# Patient Record
Sex: Female | Born: 1965 | Hispanic: No | Marital: Married | Smoking: Never smoker
Health system: Southern US, Community
[De-identification: ages and names within clinical notes are randomized; demographics above are authoritative.]

## PROBLEM LIST (undated history)

## (undated) DIAGNOSIS — E1165 Type 2 diabetes mellitus with hyperglycemia: Secondary | ICD-10-CM

## (undated) DIAGNOSIS — C50919 Malignant neoplasm of unspecified site of unspecified female breast: Secondary | ICD-10-CM

## (undated) DIAGNOSIS — G8191 Hemiplegia, unspecified affecting right dominant side: Secondary | ICD-10-CM

## (undated) DIAGNOSIS — I69398 Other sequelae of cerebral infarction: Secondary | ICD-10-CM

## (undated) DIAGNOSIS — I499 Cardiac arrhythmia, unspecified: Secondary | ICD-10-CM

## (undated) DIAGNOSIS — R519 Headache, unspecified: Secondary | ICD-10-CM

## (undated) DIAGNOSIS — D62 Acute posthemorrhagic anemia: Secondary | ICD-10-CM

## (undated) DIAGNOSIS — I10 Essential (primary) hypertension: Secondary | ICD-10-CM

## (undated) DIAGNOSIS — C50112 Malignant neoplasm of central portion of left female breast: Secondary | ICD-10-CM

## (undated) DIAGNOSIS — R208 Other disturbances of skin sensation: Secondary | ICD-10-CM

## (undated) DIAGNOSIS — R001 Bradycardia, unspecified: Secondary | ICD-10-CM

## (undated) DIAGNOSIS — K5901 Slow transit constipation: Secondary | ICD-10-CM

## (undated) DIAGNOSIS — R209 Unspecified disturbances of skin sensation: Secondary | ICD-10-CM

## (undated) DIAGNOSIS — I509 Heart failure, unspecified: Secondary | ICD-10-CM

## (undated) DIAGNOSIS — Z170 Estrogen receptor positive status [ER+]: Secondary | ICD-10-CM

## (undated) HISTORY — DX: Slow transit constipation: K59.01

## (undated) HISTORY — DX: Hemiplegia, unspecified affecting right dominant side: G81.91

## (undated) HISTORY — DX: Bradycardia, unspecified: R00.1

## (undated) HISTORY — DX: Unspecified disturbances of skin sensation: R20.9

## (undated) HISTORY — PX: BREAST BIOPSY: SHX20

## (undated) HISTORY — DX: Acute posthemorrhagic anemia: D62

## (undated) HISTORY — DX: Essential (primary) hypertension: I10

## (undated) HISTORY — DX: Other sequelae of cerebral infarction: I69.398

## (undated) HISTORY — DX: Type 2 diabetes mellitus with hyperglycemia: E11.65

## (undated) HISTORY — DX: Estrogen receptor positive status (ER+): Z17.0

## (undated) HISTORY — DX: Estrogen receptor positive status (ER+): C50.112

## (undated) HISTORY — DX: Other disturbances of skin sensation: R20.8

---

## 2020-01-08 ENCOUNTER — Other Ambulatory Visit: Payer: Self-pay | Admitting: Hematology and Oncology

## 2020-01-29 HISTORY — PX: CARDIAC CATHETERIZATION: SHX172

## 2020-03-27 ENCOUNTER — Emergency Department (HOSPITAL_COMMUNITY): Payer: No Typology Code available for payment source

## 2020-03-27 ENCOUNTER — Encounter (HOSPITAL_COMMUNITY): Payer: Self-pay | Admitting: Emergency Medicine

## 2020-03-27 ENCOUNTER — Other Ambulatory Visit: Payer: Self-pay

## 2020-03-27 ENCOUNTER — Inpatient Hospital Stay (HOSPITAL_COMMUNITY)
Admission: EM | Admit: 2020-03-27 | Discharge: 2020-04-01 | DRG: 065 | Disposition: A | Discharge status: Rehabilitation Facility | Payer: No Typology Code available for payment source | Attending: Internal Medicine | Admitting: Internal Medicine

## 2020-03-27 DIAGNOSIS — I10 Essential (primary) hypertension: Secondary | ICD-10-CM

## 2020-03-27 DIAGNOSIS — I63432 Cerebral infarction due to embolism of left posterior cerebral artery: Principal | ICD-10-CM | POA: Diagnosis present

## 2020-03-27 DIAGNOSIS — R29702 NIHSS score 2: Secondary | ICD-10-CM | POA: Diagnosis present

## 2020-03-27 DIAGNOSIS — Z833 Family history of diabetes mellitus: Secondary | ICD-10-CM

## 2020-03-27 DIAGNOSIS — R29898 Other symptoms and signs involving the musculoskeletal system: Secondary | ICD-10-CM

## 2020-03-27 DIAGNOSIS — G8191 Hemiplegia, unspecified affecting right dominant side: Secondary | ICD-10-CM

## 2020-03-27 DIAGNOSIS — E1165 Type 2 diabetes mellitus with hyperglycemia: Secondary | ICD-10-CM

## 2020-03-27 DIAGNOSIS — E785 Hyperlipidemia, unspecified: Secondary | ICD-10-CM | POA: Diagnosis present

## 2020-03-27 DIAGNOSIS — I5032 Chronic diastolic (congestive) heart failure: Secondary | ICD-10-CM | POA: Diagnosis present

## 2020-03-27 DIAGNOSIS — G459 Transient cerebral ischemic attack, unspecified: Secondary | ICD-10-CM

## 2020-03-27 DIAGNOSIS — Z79899 Other long term (current) drug therapy: Secondary | ICD-10-CM

## 2020-03-27 DIAGNOSIS — I110 Hypertensive heart disease with heart failure: Secondary | ICD-10-CM | POA: Diagnosis present

## 2020-03-27 DIAGNOSIS — D649 Anemia, unspecified: Secondary | ICD-10-CM | POA: Diagnosis present

## 2020-03-27 DIAGNOSIS — M21371 Foot drop, right foot: Secondary | ICD-10-CM | POA: Diagnosis present

## 2020-03-27 DIAGNOSIS — R29810 Facial weakness: Secondary | ICD-10-CM | POA: Diagnosis present

## 2020-03-27 DIAGNOSIS — I509 Heart failure, unspecified: Secondary | ICD-10-CM

## 2020-03-27 DIAGNOSIS — C50919 Malignant neoplasm of unspecified site of unspecified female breast: Secondary | ICD-10-CM | POA: Diagnosis present

## 2020-03-27 DIAGNOSIS — I480 Paroxysmal atrial fibrillation: Secondary | ICD-10-CM | POA: Diagnosis present

## 2020-03-27 DIAGNOSIS — E876 Hypokalemia: Secondary | ICD-10-CM | POA: Diagnosis present

## 2020-03-27 DIAGNOSIS — I639 Cerebral infarction, unspecified: Secondary | ICD-10-CM | POA: Diagnosis present

## 2020-03-27 DIAGNOSIS — Z79811 Long term (current) use of aromatase inhibitors: Secondary | ICD-10-CM

## 2020-03-27 DIAGNOSIS — Z7901 Long term (current) use of anticoagulants: Secondary | ICD-10-CM

## 2020-03-27 DIAGNOSIS — I951 Orthostatic hypotension: Secondary | ICD-10-CM | POA: Diagnosis not present

## 2020-03-27 DIAGNOSIS — Z20822 Contact with and (suspected) exposure to covid-19: Secondary | ICD-10-CM | POA: Diagnosis present

## 2020-03-27 DIAGNOSIS — I6340 Cerebral infarction due to embolism of unspecified cerebral artery: Secondary | ICD-10-CM | POA: Insufficient documentation

## 2020-03-27 HISTORY — DX: Heart failure, unspecified: I50.9

## 2020-03-27 HISTORY — DX: Cardiac arrhythmia, unspecified: I49.9

## 2020-03-27 HISTORY — DX: Essential (primary) hypertension: I10

## 2020-03-27 HISTORY — DX: Malignant neoplasm of unspecified site of unspecified female breast: C50.919

## 2020-03-27 LAB — COMPREHENSIVE METABOLIC PANEL
ALT: 16 U/L (ref 0–44)
AST: 22 U/L (ref 15–41)
Albumin: 3.1 g/dL — ABNORMAL LOW (ref 3.5–5.0)
Alkaline Phosphatase: 58 U/L (ref 38–126)
Anion gap: 9 (ref 5–15)
BUN: 11 mg/dL (ref 6–20)
CO2: 25 mmol/L (ref 22–32)
Calcium: 9.1 mg/dL (ref 8.9–10.3)
Chloride: 105 mmol/L (ref 98–111)
Creatinine, Ser: 0.88 mg/dL (ref 0.44–1.00)
GFR, Estimated: >60 mL/min (ref >60)
Glucose, Bld: 175 mg/dL — ABNORMAL HIGH (ref 70–99)
Potassium: 3.3 mmol/L — ABNORMAL LOW (ref 3.5–5.1)
Sodium: 139 mmol/L (ref 135–145)
Total Bilirubin: 0.9 mg/dL (ref 0.3–1.2)
Total Protein: 7.9 g/dL (ref 6.5–8.1)

## 2020-03-27 LAB — DIFFERENTIAL
Abs Immature Granulocytes: 0.03 10*3/uL (ref 0.00–0.07)
Basophils Absolute: 0 10*3/uL (ref 0.0–0.1)
Basophils Relative: 0 %
Eosinophils Absolute: 0.1 10*3/uL (ref 0.0–0.5)
Eosinophils Relative: 2 %
Immature Granulocytes: 0 %
Lymphocytes Relative: 36 %
Lymphs Abs: 3 10*3/uL (ref 0.7–4.0)
Monocytes Absolute: 0.5 10*3/uL (ref 0.1–1.0)
Monocytes Relative: 6 %
Neutro Abs: 4.6 10*3/uL (ref 1.7–7.7)
Neutrophils Relative %: 56 %

## 2020-03-27 LAB — I-STAT CHEM 8, ED
BUN: 12 mg/dL (ref 6–20)
Calcium, Ion: 1.18 mmol/L (ref 1.15–1.40)
Chloride: 104 mmol/L (ref 98–111)
Creatinine, Ser: 0.8 mg/dL (ref 0.44–1.00)
Glucose, Bld: 171 mg/dL — ABNORMAL HIGH (ref 70–99)
HCT: 39 % (ref 36.0–46.0)
Hemoglobin: 13.3 g/dL (ref 12.0–15.0)
Potassium: 3.2 mmol/L — ABNORMAL LOW (ref 3.5–5.1)
Sodium: 142 mmol/L (ref 135–145)
TCO2: 26 mmol/L (ref 22–32)

## 2020-03-27 LAB — CBC
HCT: 38.5 % (ref 36.0–46.0)
Hemoglobin: 11.6 g/dL — ABNORMAL LOW (ref 12.0–15.0)
MCH: 25.4 pg — ABNORMAL LOW (ref 26.0–34.0)
MCHC: 30.1 g/dL (ref 30.0–36.0)
MCV: 84.2 fL (ref 80.0–100.0)
Platelets: 328 10*3/uL (ref 150–400)
RBC: 4.57 MIL/uL (ref 3.87–5.11)
RDW: 19.5 % — ABNORMAL HIGH (ref 11.5–15.5)
WBC: 8.3 10*3/uL (ref 4.0–10.5)
nRBC: 0 % (ref 0.0–0.2)

## 2020-03-27 LAB — PROTIME-INR
INR: 1.2 (ref 0.8–1.2)
Prothrombin Time: 14.3 seconds (ref 11.4–15.2)

## 2020-03-27 LAB — I-STAT BETA HCG BLOOD, ED (MC, WL, AP ONLY): I-stat hCG, quantitative: <5 m[IU]/mL (ref <5)

## 2020-03-27 LAB — APTT: aPTT: 29 seconds (ref 24–36)

## 2020-03-27 IMAGING — CT CT HEAD W/O CM
3 series · 15 of 47 positions shown, 18 images · non-contrast
Comparison: None.

CLINICAL DATA: Cranial neuropathy, altered mental status, vomiting,
temporal headache

EXAM:
CT HEAD WITHOUT CONTRAST
TECHNIQUE: Contiguous axial images were obtained from the base of the skull
through the vertex without intravenous contrast.

[Series 3: head 5.0 h30s · axial · 0.45mm/px · z∈[-146,-6]mm · 9 of 34 slices shown, 12 images]
[im 3/34  brain]
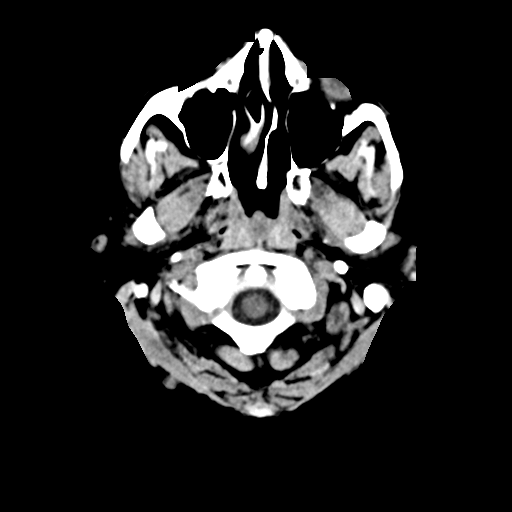
[im 3/34  bone]
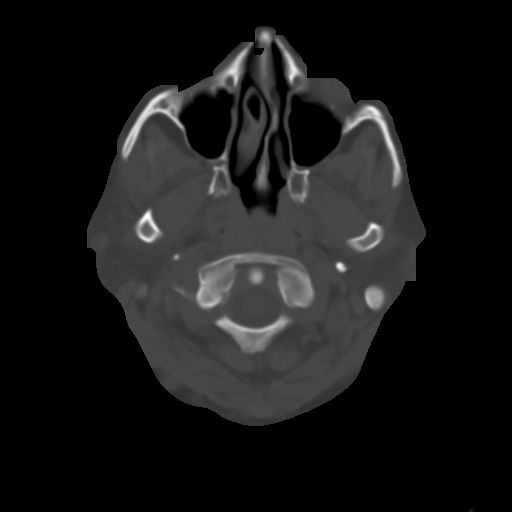
[im 6/34  brain]
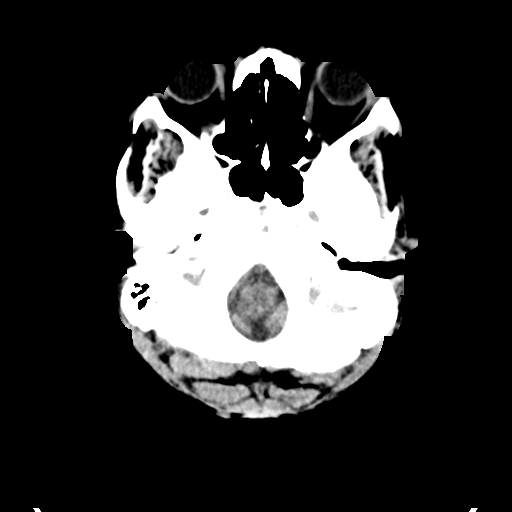
[im 10/34  brain]
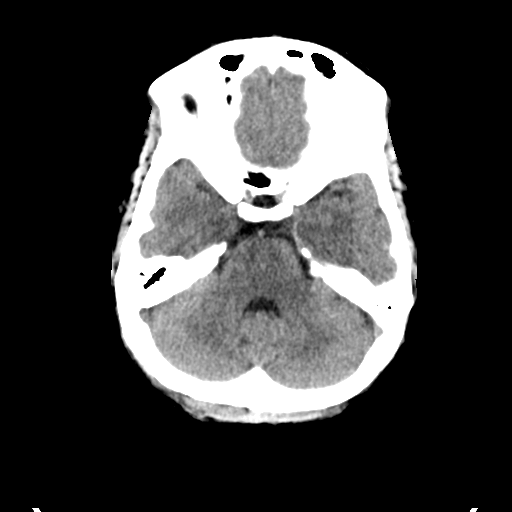
[im 13/34  brain]
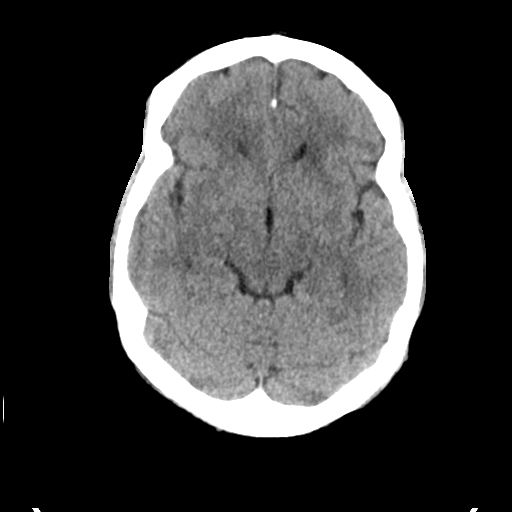
[im 18/34  brain]
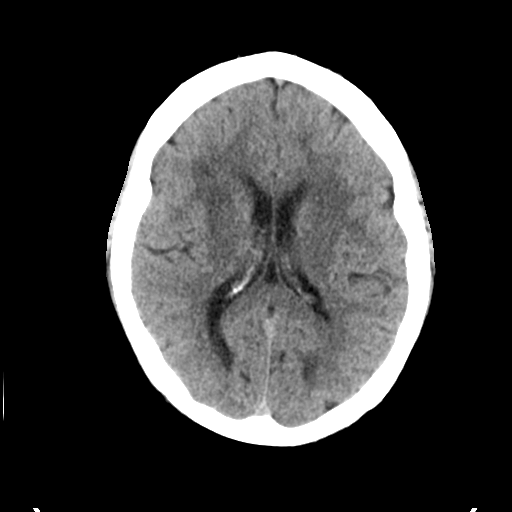
[im 18/34  bone]
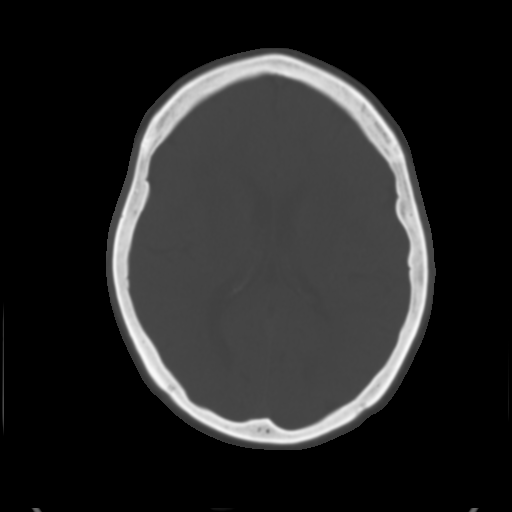
[im 21/34  brain]
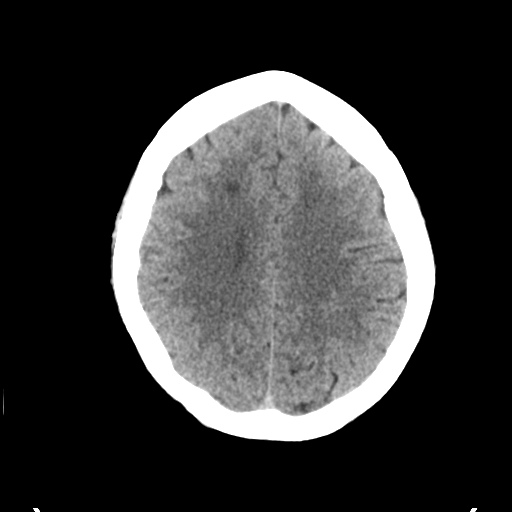
[im 24/34  brain]
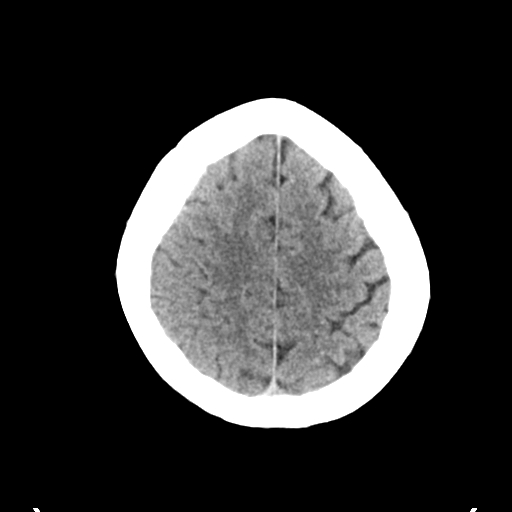
[im 28/34  brain]
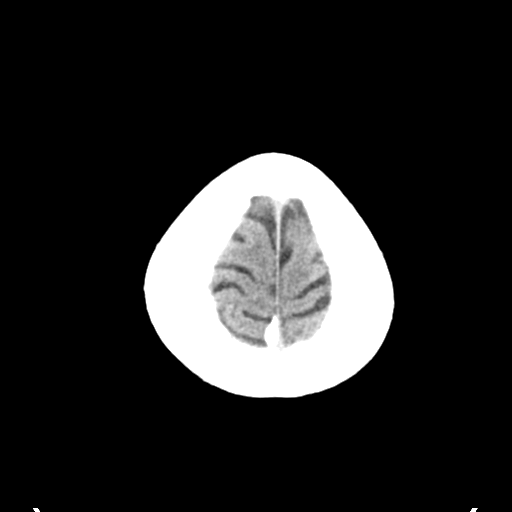
[im 31/34  brain]
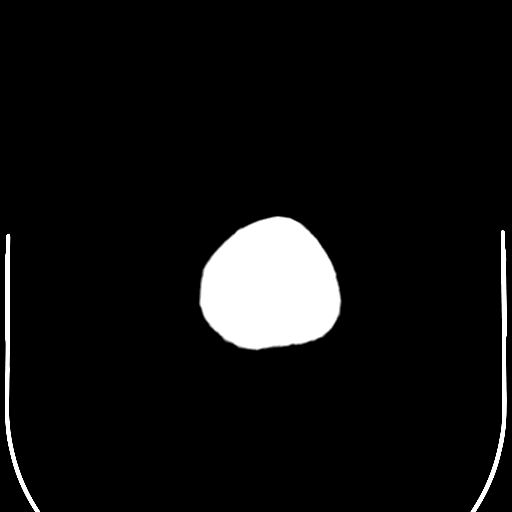
[im 31/34  bone]
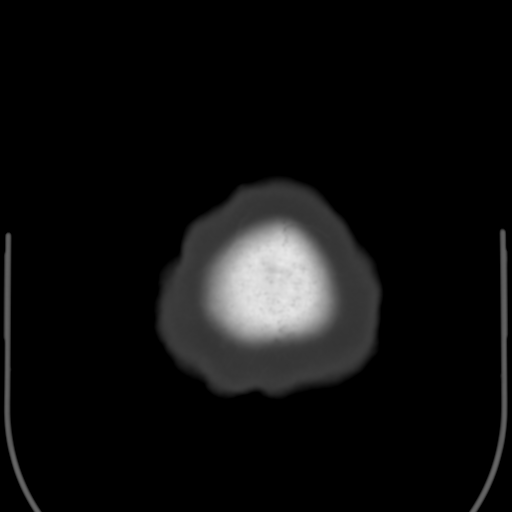

[Series 5: head 3.0 mpr cor · coronal · 0.32mm/px · 3 of 69 slices shown]
[im 23/69  brain]
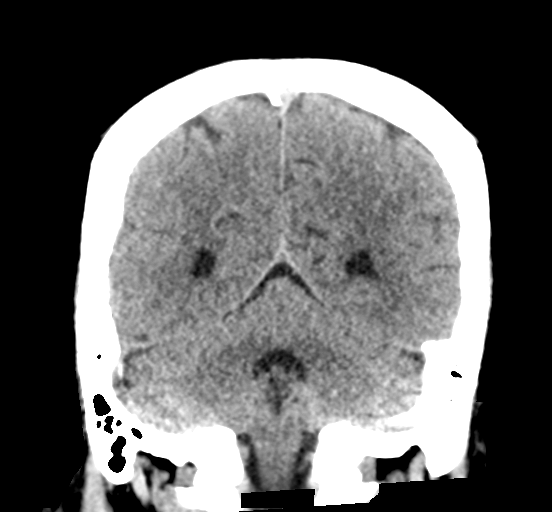
[im 31/69  brain]
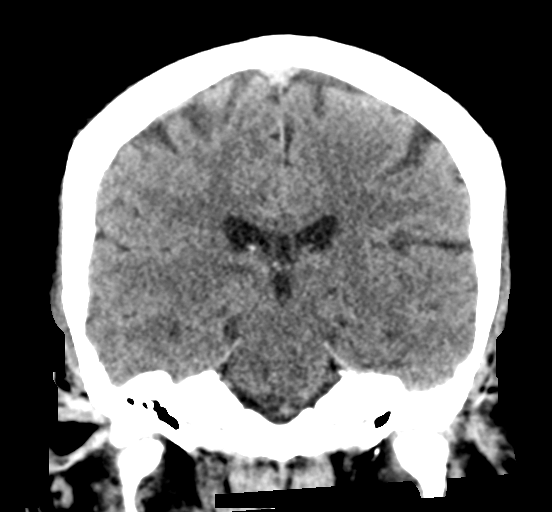
[im 38/69  brain]
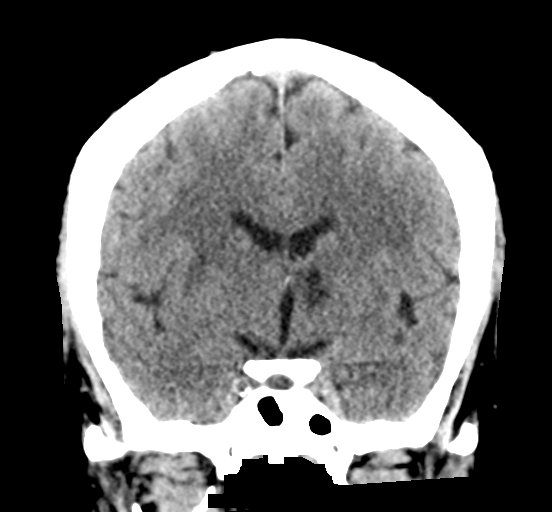

[Series 6: head 3.0 mpr sag · sagittal · 0.32mm/px · 3 of 67 slices shown]
[im 23/67  brain]
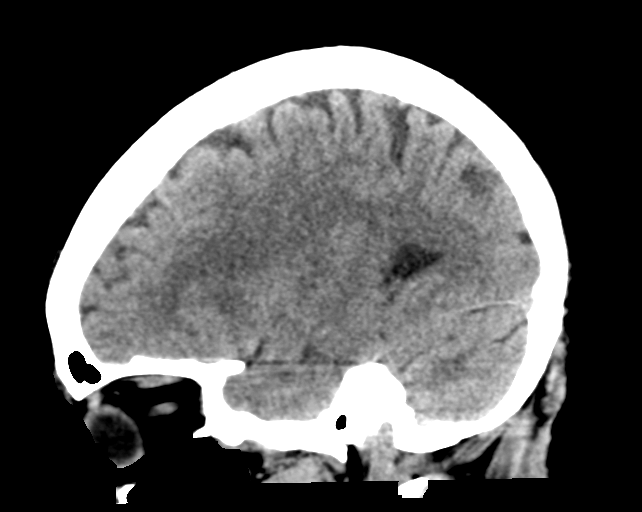
[im 34/67  brain]
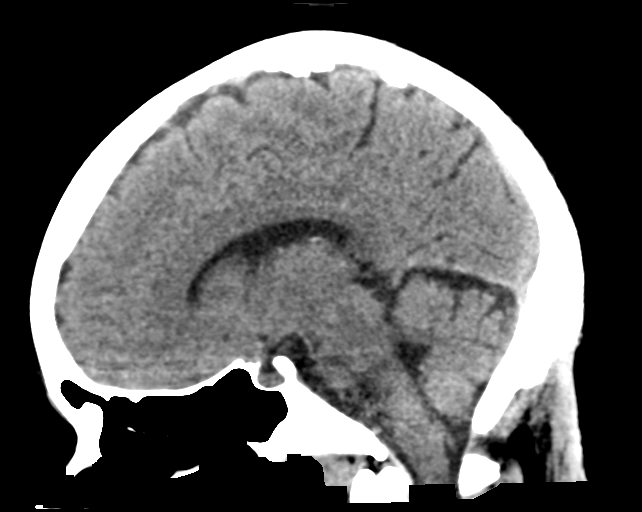
[im 45/67  brain]
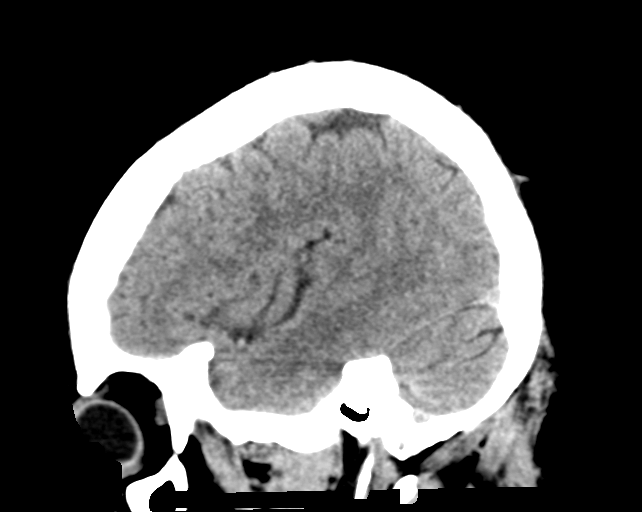

[15 of 47 positions shown; findings below may reference images not displayed]

FINDINGS: Brain: Normal anatomic configuration. There is a remote lacunar
infarct versus a neuro epithelial cyst within the left caudothalamic
groove. Mild to moderate periventricular white matter changes are
present possibly reflecting the sequela of small vessel ischemia. No
evidence of acute intracranial hemorrhage or infarct. No abnormal
mass effect or midline shift. No abnormal intra or extra-axial mass
lesion or fluid collection. Ventricular size is normal. The
cerebellum is unremarkable.

Vascular: No asymmetric hyperdense vasculature at the skull base.

Skull: Intact

Sinuses/Orbits: The orbits are unremarkable. Paranasal sinuses are
clear.

Other: Mastoid air cells and middle ear cavities are clear.
IMPRESSION: Chronic microangiopathic changes, slightly advanced than would be
typically expected for a patient of this age. Correlation for neuro
degenerative risk factors may be helpful for further management.

No evidence of acute intracranial hemorrhage or infarct.

## 2020-03-27 MED ORDER — SODIUM CHLORIDE 0.9% FLUSH
3.0000 mL | Freq: Once | INTRAVENOUS | Status: AC
Start: 2020-03-27 — End: 2020-03-28
  Administered 2020-03-28: 3 mL via INTRAVENOUS

## 2020-03-27 NOTE — ED Triage Notes (Signed)
Pt states she is feeling weakness all over and she is dragging her right foot since last night. Pt is AO x 4 on triage no neuro deficit noticed.

## 2020-03-28 ENCOUNTER — Observation Stay (HOSPITAL_COMMUNITY): Payer: No Typology Code available for payment source

## 2020-03-28 ENCOUNTER — Encounter (HOSPITAL_COMMUNITY): Payer: Self-pay | Admitting: Internal Medicine

## 2020-03-28 DIAGNOSIS — Z170 Estrogen receptor positive status [ER+]: Secondary | ICD-10-CM | POA: Diagnosis not present

## 2020-03-28 DIAGNOSIS — I509 Heart failure, unspecified: Secondary | ICD-10-CM

## 2020-03-28 DIAGNOSIS — M21371 Foot drop, right foot: Secondary | ICD-10-CM

## 2020-03-28 DIAGNOSIS — I6340 Cerebral infarction due to embolism of unspecified cerebral artery: Secondary | ICD-10-CM

## 2020-03-28 DIAGNOSIS — Z7901 Long term (current) use of anticoagulants: Secondary | ICD-10-CM | POA: Diagnosis not present

## 2020-03-28 DIAGNOSIS — I480 Paroxysmal atrial fibrillation: Secondary | ICD-10-CM | POA: Diagnosis present

## 2020-03-28 DIAGNOSIS — D649 Anemia, unspecified: Secondary | ICD-10-CM | POA: Diagnosis present

## 2020-03-28 DIAGNOSIS — I639 Cerebral infarction, unspecified: Secondary | ICD-10-CM

## 2020-03-28 DIAGNOSIS — C50912 Malignant neoplasm of unspecified site of left female breast: Secondary | ICD-10-CM

## 2020-03-28 DIAGNOSIS — I6389 Other cerebral infarction: Secondary | ICD-10-CM

## 2020-03-28 HISTORY — DX: Cerebral infarction due to embolism of unspecified cerebral artery: I63.40

## 2020-03-28 HISTORY — DX: Foot drop, right foot: M21.371

## 2020-03-28 HISTORY — DX: Anemia, unspecified: D64.9

## 2020-03-28 HISTORY — DX: Paroxysmal atrial fibrillation: I48.0

## 2020-03-28 LAB — BASIC METABOLIC PANEL
Anion gap: 10 (ref 5–15)
Anion gap: 11 (ref 5–15)
BUN: 16 mg/dL (ref 6–20)
BUN: 16 mg/dL (ref 6–20)
CO2: 24 mmol/L (ref 22–32)
CO2: 24 mmol/L (ref 22–32)
Calcium: 9.3 mg/dL (ref 8.9–10.3)
Calcium: 9 mg/dL (ref 8.9–10.3)
Chloride: 105 mmol/L (ref 98–111)
Chloride: 104 mmol/L (ref 98–111)
Creatinine, Ser: 0.82 mg/dL (ref 0.44–1.00)
Creatinine, Ser: 0.95 mg/dL (ref 0.44–1.00)
GFR, Estimated: >60 mL/min (ref >60)
GFR, Estimated: >60 mL/min (ref >60)
Glucose, Bld: 121 mg/dL — ABNORMAL HIGH (ref 70–99)
Glucose, Bld: 155 mg/dL — ABNORMAL HIGH (ref 70–99)
Potassium: 3.3 mmol/L — ABNORMAL LOW (ref 3.5–5.1)
Potassium: 3.3 mmol/L — ABNORMAL LOW (ref 3.5–5.1)
Sodium: 139 mmol/L (ref 135–145)
Sodium: 139 mmol/L (ref 135–145)

## 2020-03-28 LAB — GLUCOSE, CAPILLARY
Glucose-Capillary: 137 mg/dL — ABNORMAL HIGH (ref 70–99)
Glucose-Capillary: 147 mg/dL — ABNORMAL HIGH (ref 70–99)

## 2020-03-28 LAB — LIPID PANEL
Cholesterol: 207 mg/dL — ABNORMAL HIGH (ref 0–200)
HDL: 63 mg/dL (ref >40)
LDL Cholesterol: 129 mg/dL — ABNORMAL HIGH (ref 0–99)
Total CHOL/HDL Ratio: 3.3 RATIO
Triglycerides: 73 mg/dL (ref <150)
VLDL: 15 mg/dL (ref 0–40)

## 2020-03-28 LAB — RESPIRATORY PANEL BY RT PCR (FLU A&B, COVID)
Influenza A by PCR: NEGATIVE
Influenza B by PCR: NEGATIVE
SARS Coronavirus 2 by RT PCR: NEGATIVE

## 2020-03-28 LAB — ECHOCARDIOGRAM COMPLETE
AR max vel: 2.33 cm2
AV Area VTI: 2.43 cm2
AV Area mean vel: 2.56 cm2
AV Mean grad: 6 mmHg
AV Peak grad: 12.1 mmHg
Ao pk vel: 1.74 m/s
Area-P 1/2: 3.01 cm2
Height: 66 in
S' Lateral: 3.2 cm
Single Plane A4C EF: 56 %
Weight: 2960 oz

## 2020-03-28 LAB — RAPID URINE DRUG SCREEN, HOSP PERFORMED
Amphetamines: NOT DETECTED
Barbiturates: NOT DETECTED
Benzodiazepines: NOT DETECTED
Cocaine: NOT DETECTED
Opiates: NOT DETECTED
Tetrahydrocannabinol: NOT DETECTED

## 2020-03-28 LAB — HEMOGLOBIN A1C
Hgb A1c MFr Bld: 7.2 % — ABNORMAL HIGH (ref 4.8–5.6)
Mean Plasma Glucose: 159.94 mg/dL

## 2020-03-28 LAB — MAGNESIUM: Magnesium: 1.8 mg/dL (ref 1.7–2.4)

## 2020-03-28 LAB — HIV ANTIBODY (ROUTINE TESTING W REFLEX): HIV Screen 4th Generation wRfx: NONREACTIVE

## 2020-03-28 IMAGING — MR MR MRA NECK W/O CM
3 of 6 series · 39 of 48 positions shown · non-contrast
Comparison: None.

CLINICAL DATA: Stroke follow-up



[Series 21: tof_fl3d_tra_iso · axial · 0.6mm · 0.52mm/px · z∈[-183,-107]mm · 21 of 131 slices shown]
[im 1/131]
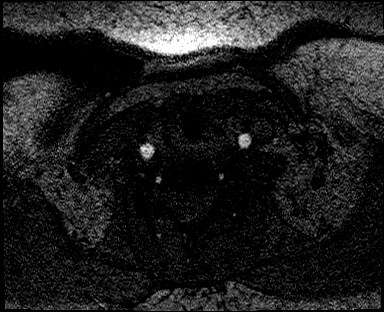
[im 7/131]
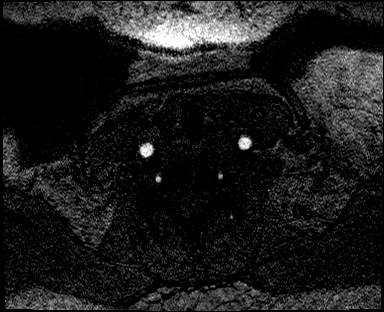
[im 14/131]
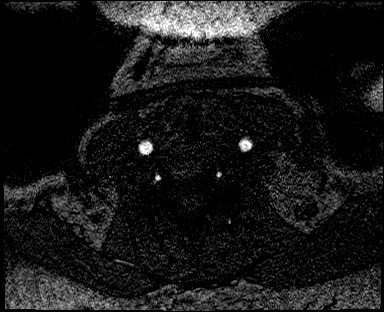
[im 20/131]
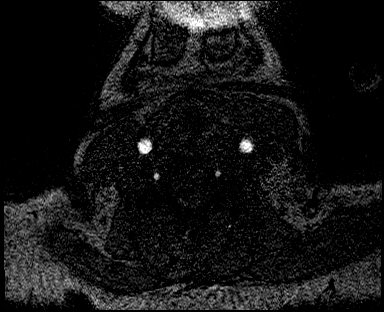
[im 27/131]
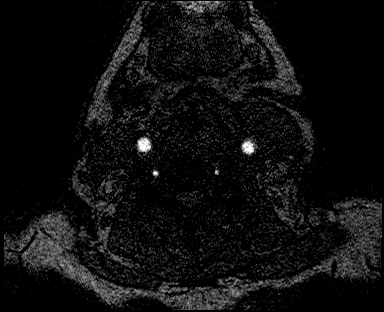
[im 33/131]
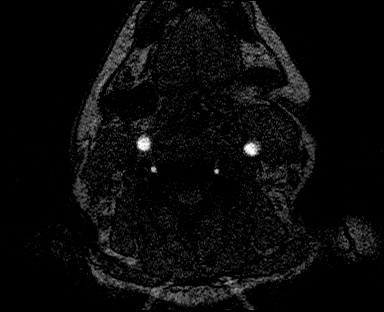
[im 40/131]
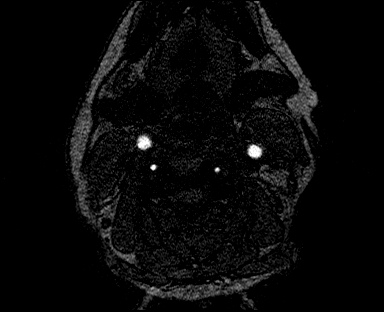
[im 46/131]
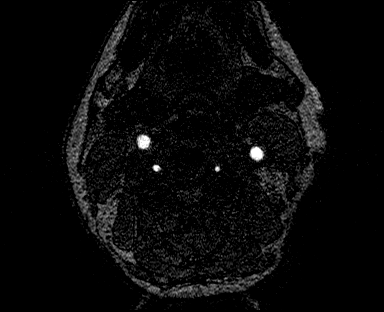
[im 53/131]
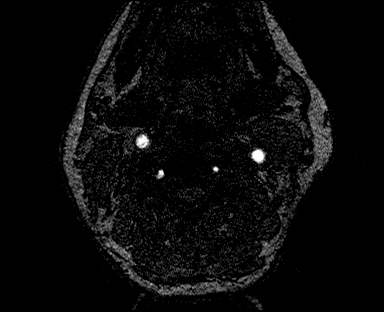
[im 59/131]
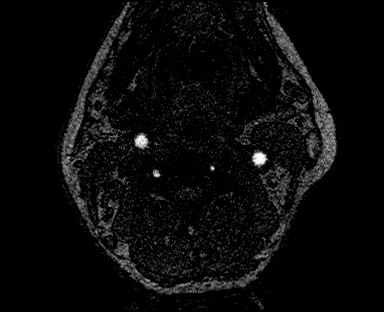
[im 66/131]
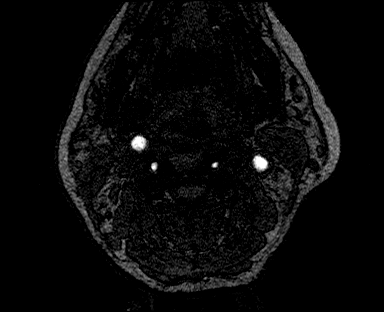
[im 72/131]
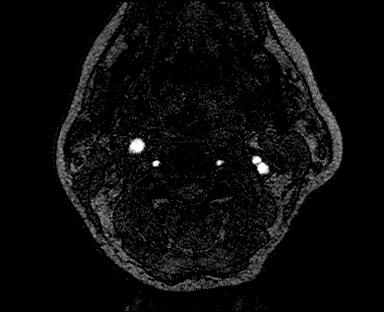
[im 79/131]
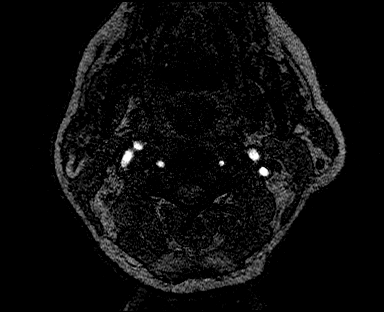
[im 85/131]
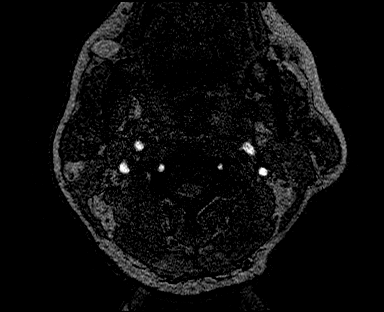
[im 92/131]
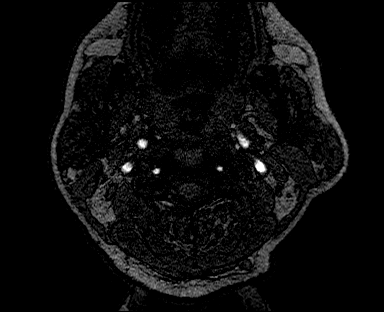
[im 98/131]
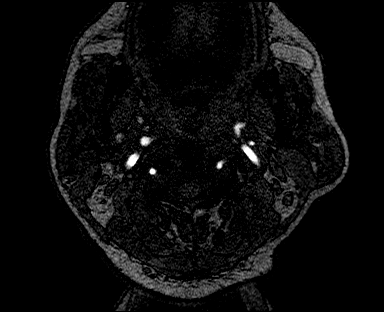
[im 105/131]
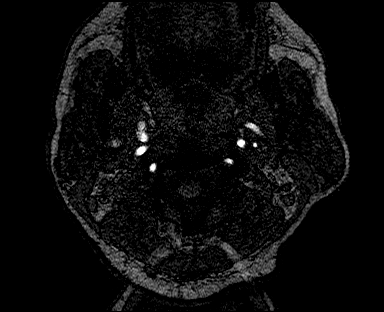
[im 111/131]
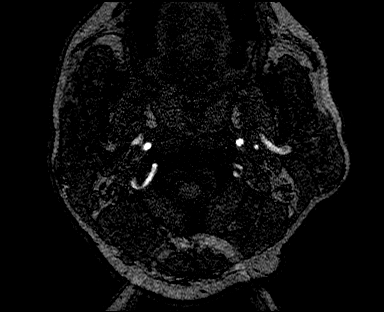
[im 118/131]
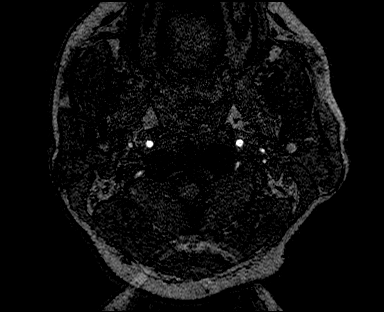
[im 124/131]
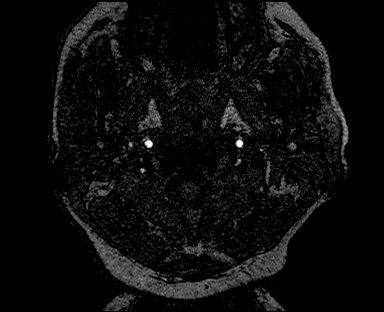
[im 131/131]
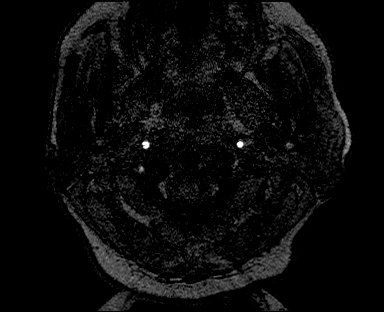

[Series 24: tof_fl2d_tra · axial · 3.0mm · 0.81mm/px · z∈[-268,-48]mm · 17 of 103 slices shown]
[im 1/103]
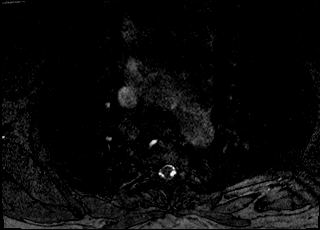
[im 7/103]
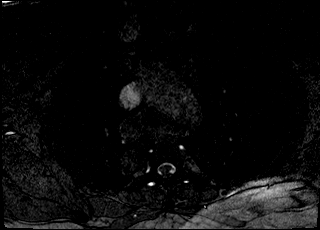
[im 13/103]
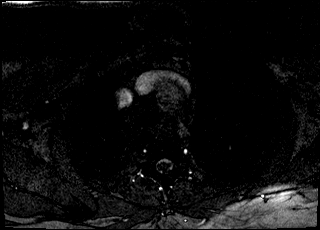
[im 20/103]
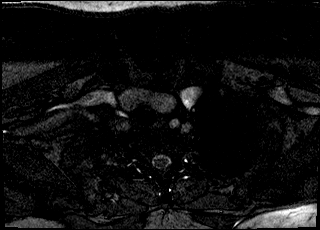
[im 26/103]
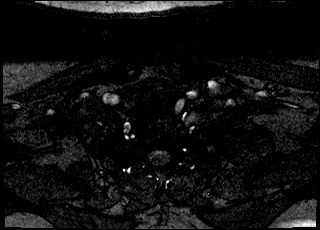
[im 32/103]
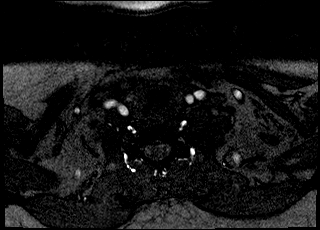
[im 39/103]
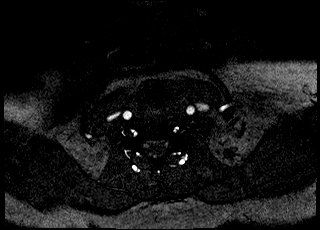
[im 45/103]
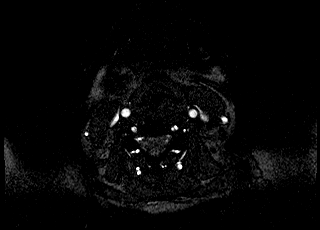
[im 52/103]
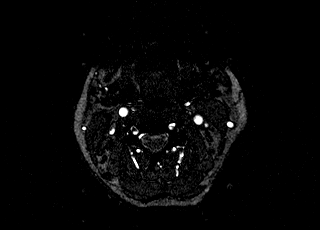
[im 58/103]
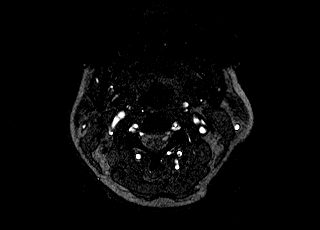
[im 64/103]
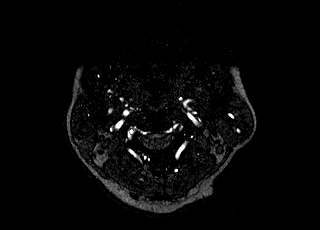
[im 71/103]
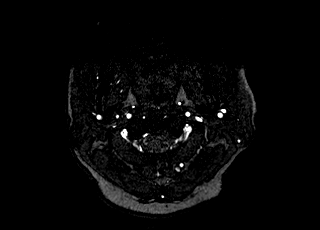
[im 77/103]
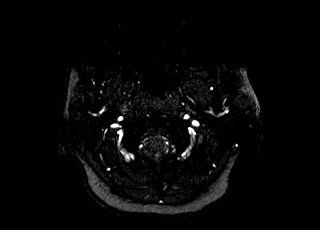
[im 83/103]
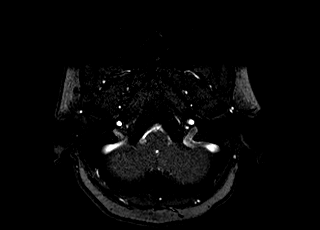
[im 90/103]
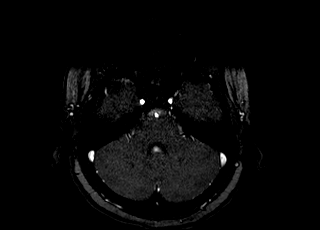
[im 96/103]
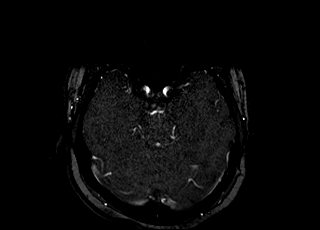
[im 103/103]
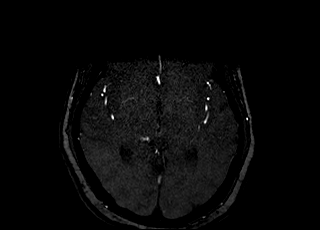

[Series 25: tof_fl2d_tra_mip_tra · axial · 229.4mm · 0.81mm/px · 1 of 1 slices shown]
[im 1/1]
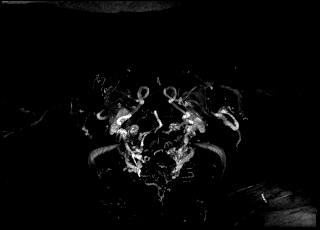

[39 of 48 positions shown; findings below may reference images not displayed]

FINDINGS: MRI HEAD FINDINGS

Brain: Multifocal acute ischemia within the dorsomedial left
thalamus and left cerebral peduncle. There is also a small focus in
the splenium of the corpus callosum and the left basal ganglia.
There are multiple old small vessel infarcts. Multifocal
hyperintense T2-weighted signal within the white matter. Normal
volume of CSF spaces. No chronic microhemorrhage. Normal midline
structures.

Vascular: Normal flow voids.

Skull and upper cervical spine: Normal marrow signal.

Sinuses/Orbits: Negative.

Other: None.

MRA HEAD FINDINGS

POSTERIOR CIRCULATION:

--Vertebral arteries: Normal

--Inferior cerebellar arteries: Normal.

--Basilar artery: Normal.

--Superior cerebellar arteries: Normal.

--Posterior cerebral arteries: The left posterior cerebral artery is
occluded at the P1 2 junction. The right is normal.

ANTERIOR CIRCULATION:

--Intracranial internal carotid arteries: Normal.

--Anterior cerebral arteries (ACA): Normal.

--Middle cerebral arteries (MCA): Normal.

ANATOMIC VARIANTS: Fetal origin of the right PCA.

MRA NECK FINDINGS

Carotid and vertebral arteries are normal.
IMPRESSION: 1. Multifocal acute ischemia within the dorsomedial left thalamus
and left cerebral peduncle. There are also small foci in the
splenium of the corpus callosum and the left basal ganglia.
2. Occlusion of the left posterior cerebral artery at the P1-2
junction.
3. Otherwise normal MRA of the head and neck.

## 2020-03-28 IMAGING — MR MR HEAD W/ CM
2 of 4 series · 15 of 48 positions shown · IV contrast (gadavist)
Comparison: [DATE] CT and MRI head.

CLINICAL DATA: Breast cancer, staging.

EXAM:
MRI HEAD WITH CONTRAST
TECHNIQUE: Multiplanar, multiecho pulse sequences of the brain and surrounding
structures were obtained with intravenous contrast.
CONTRAST:  8mL GADAVIST GADOBUTROL 1 MMOL/ML IV SOLN

[Series 7: T1 post-contrast · coronal · 5.0mm · 0.34mm/px · 8 of 31 slices shown (1 of 2)]
[im 1/31]
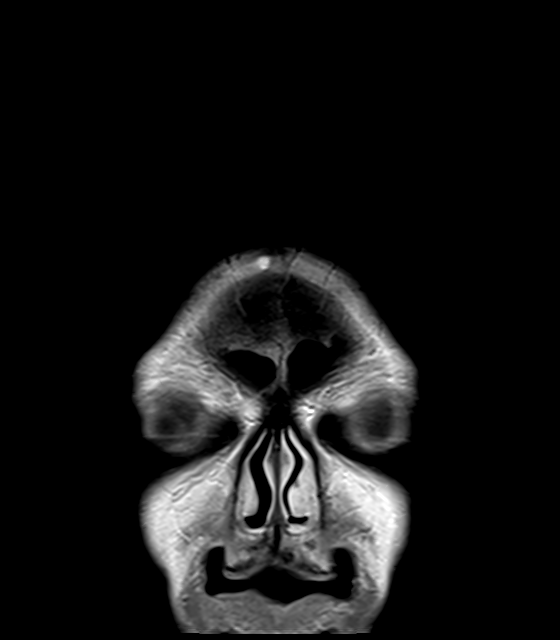
[im 5/31]
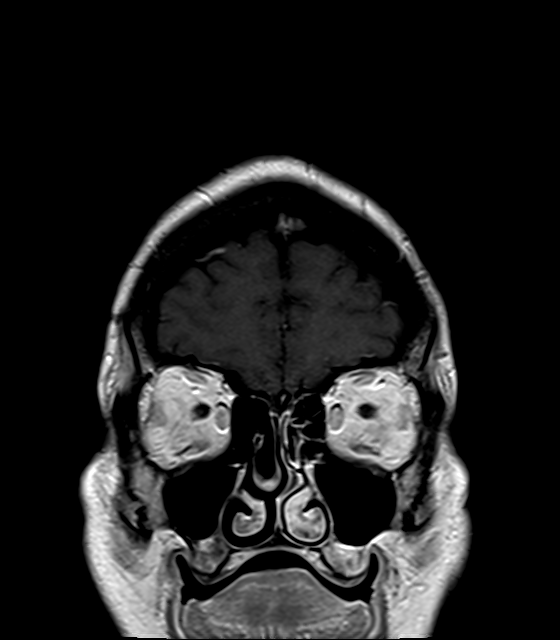
[im 9/31]
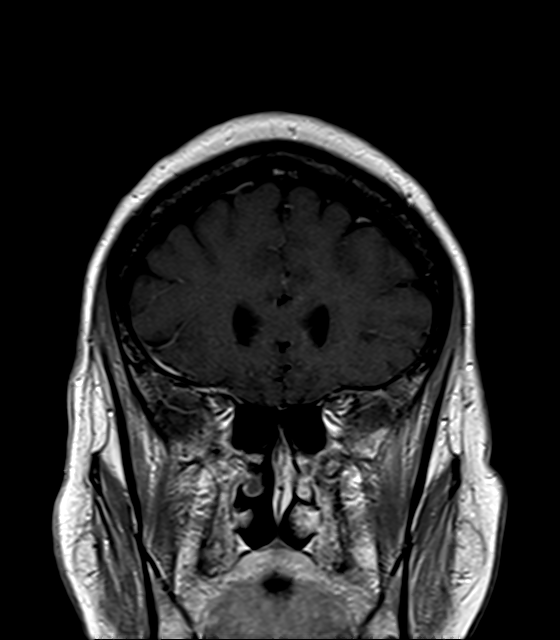
[im 13/31]
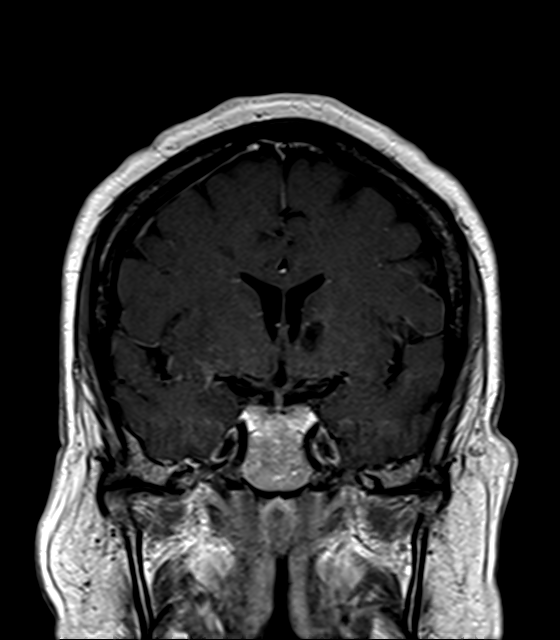
[im 18/31]
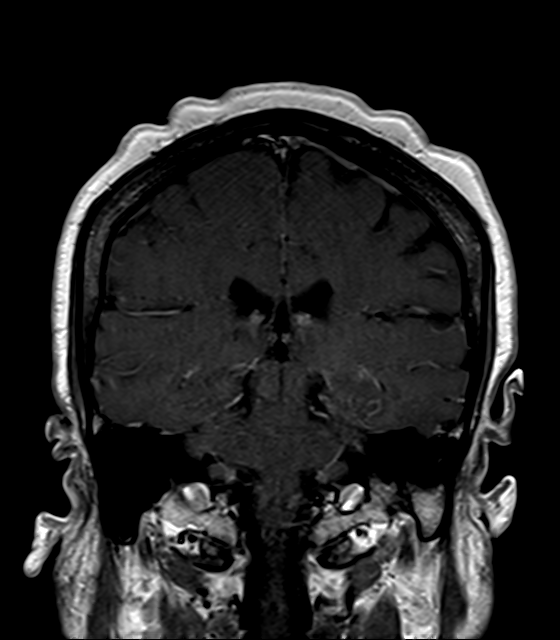
[im 22/31]
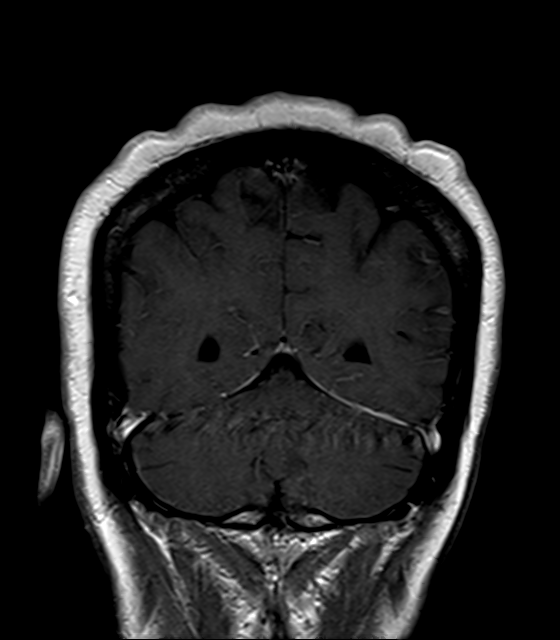
[im 26/31]
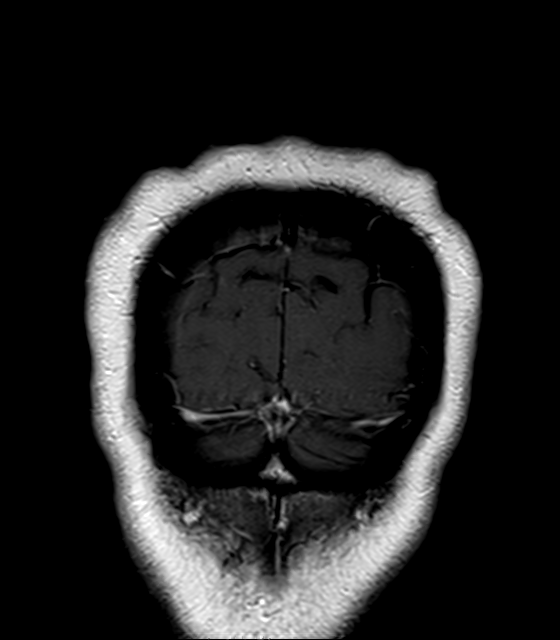
[im 31/31]
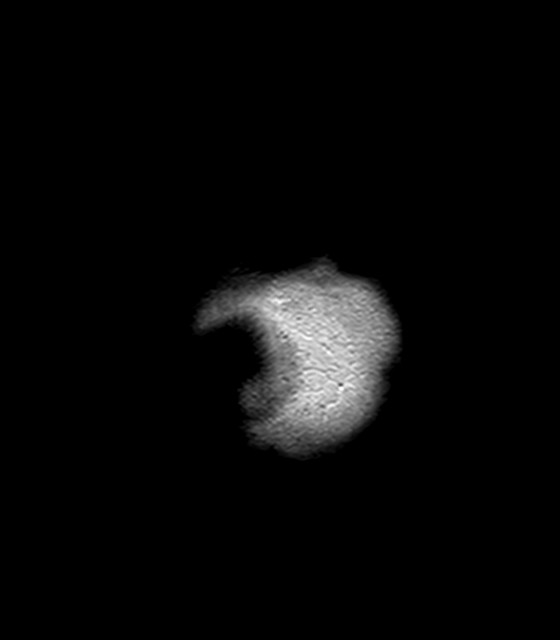

[Series 8: T1 post-contrast · sagittal · 5.0mm · 0.72mm/px · 7 of 25 slices shown (2 of 2)]
[im 1/25]
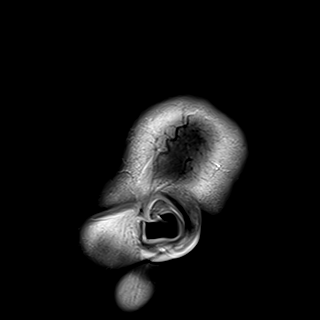
[im 5/25]
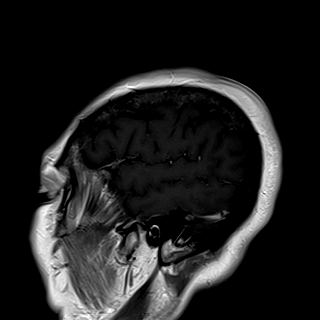
[im 9/25]
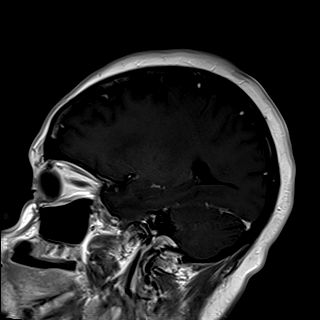
[im 13/25]
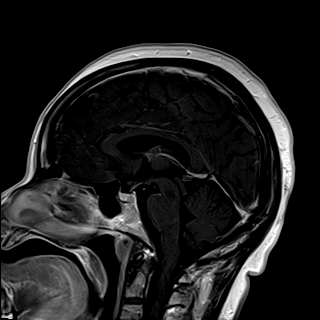
[im 17/25]
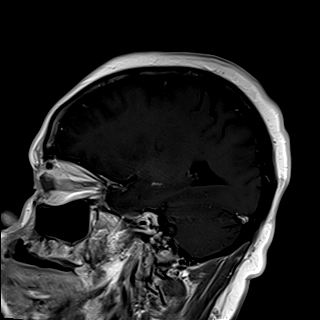
[im 21/25]
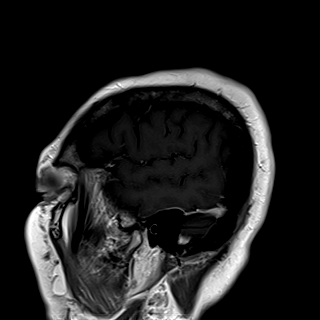
[im 25/25]
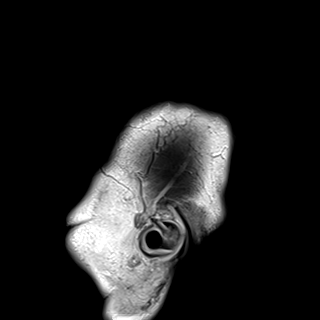

[15 of 48 positions shown; findings below may reference images not displayed]

FINDINGS: Brain: Redemonstration of multifocal acute infarcts, better
demonstrated on prior MRI. Chronic lacunar insults involving the
bilateral basal ganglia and right thalamus. Remote bilateral centrum
semiovale insults.

No acute intracranial hemorrhage. No midline shift, ventriculomegaly
or extra-axial fluid collection. No mass lesion. No abnormal
enhancement.

Vascular: Please see recent MRA head.

Skull and upper cervical spine: Normal marrow signal.

Sinuses/Orbits: Normal orbits. Pneumatized paranasal sinuses and
mastoid air cells.

Other: None.
IMPRESSION: Redemonstration of multifocal acute and chronic insults, better
demonstrated on prior MRI.

No abnormal enhancement or mass lesion.

## 2020-03-28 IMAGING — MR MR HEAD W/O CM
12 of 13 series · 44 of 48 positions shown · non-contrast
Comparison: None.

CLINICAL DATA: Stroke follow-up



[Series 5: DWI · axial · 3.0mm · 0.88mm/px · z∈[-114,+28]mm · 8 of 100 slices shown (1 of 4)]
[im 1/100]
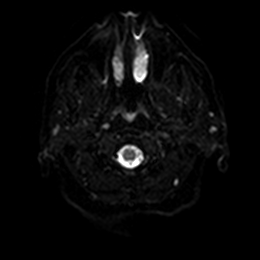
[im 15/100]
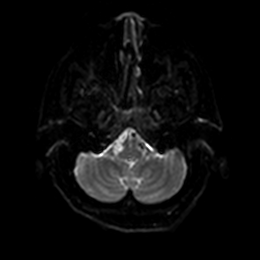
[im 29/100]
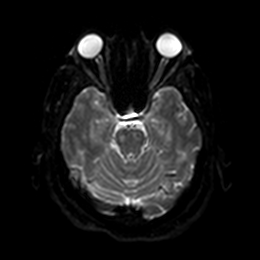
[im 43/100]
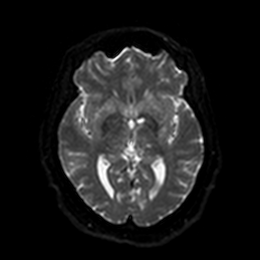
[im 57/100]
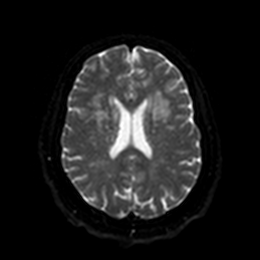
[im 71/100]
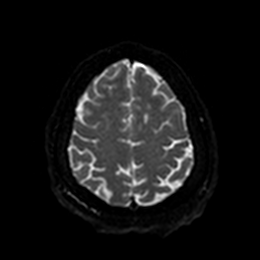
[im 85/100]
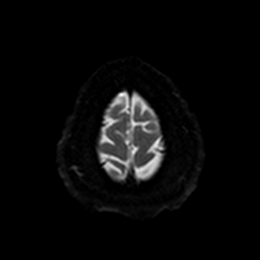
[im 100/100]
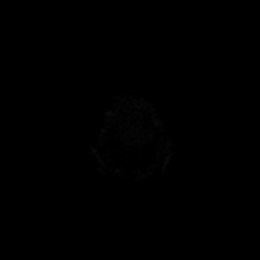

[Series 6: DWI · axial · 3.0mm · 0.88mm/px · z∈[-114,+28]mm · 4 of 50 slices shown (2 of 4)]
[im 1/50]
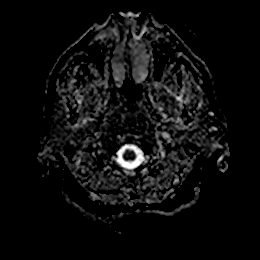
[im 17/50]
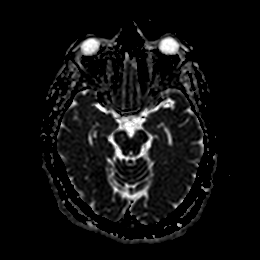
[im 33/50]
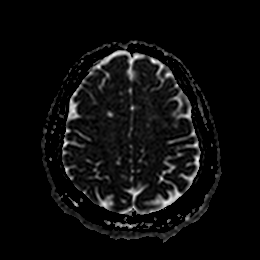
[im 50/50]
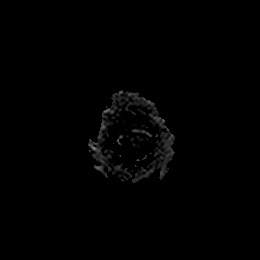

[Series 7: DWI · coronal · 4.0mm · 0.88mm/px · 5 of 66 slices shown (3 of 4)]
[im 1/66]
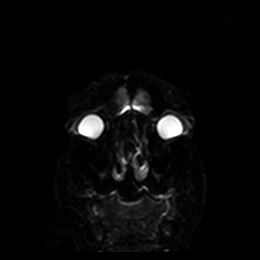
[im 17/66]
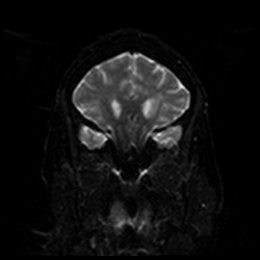
[im 33/66]
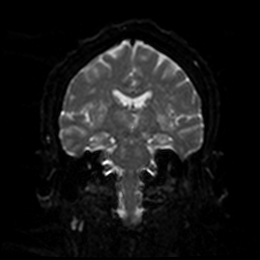
[im 49/66]
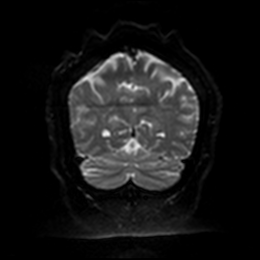
[im 66/66]
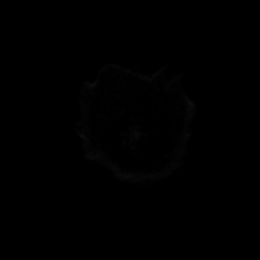

[Series 8: DWI · coronal · 4.0mm · 0.88mm/px · 3 of 33 slices shown (4 of 4)]
[im 1/33]
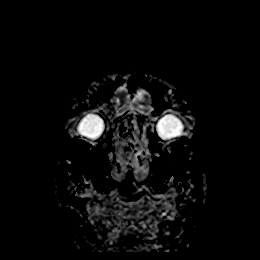
[im 17/33]
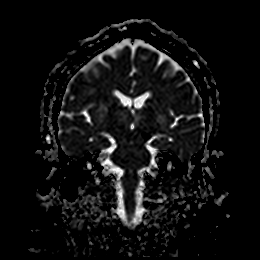
[im 33/33]
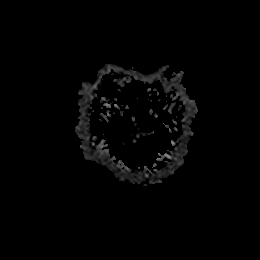

[Series 9: T1 · sagittal · 5.0mm · 0.72mm/px · 2 of 25 slices shown]
[im 1/25]
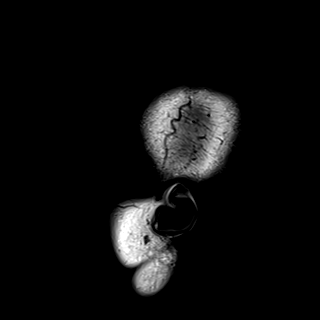
[im 25/25]
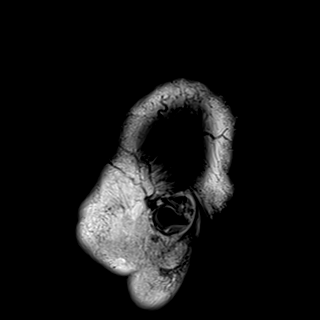

[Series 10: T2 · axial · 5.0mm · 0.72mm/px · z∈[-116,+22]mm · 2 of 25 slices shown (1 of 2)]
[im 1/25]
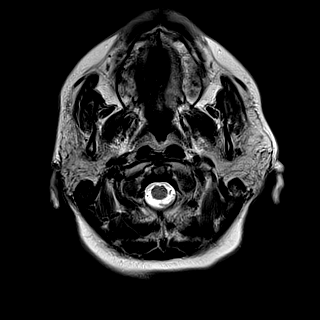
[im 25/25]
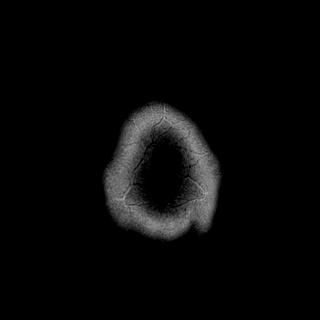

[Series 11: FLAIR · axial · 5.0mm · 0.45mm/px · z∈[-117,+21]mm · 2 of 25 slices shown]
[im 1/25]
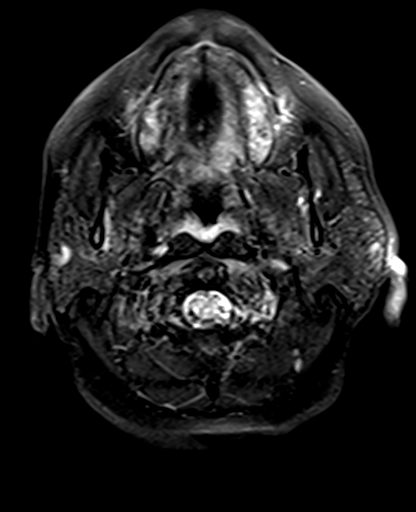
[im 25/25]
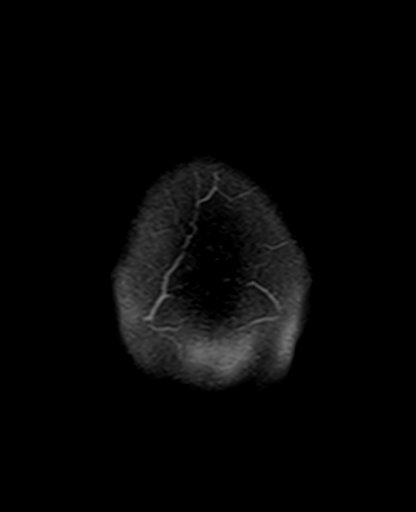

[Series 29: mag_images · axial · 3.0mm · 0.90mm/px · z∈[-127,+31]mm · 4 of 56 slices shown]
[im 1/56]
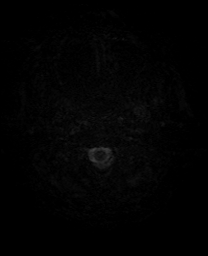
[im 19/56]
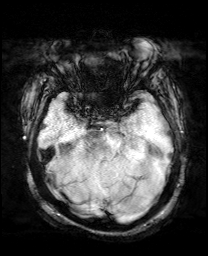
[im 37/56]
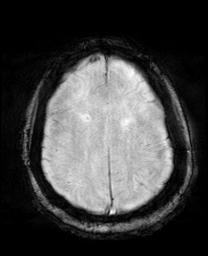
[im 56/56]
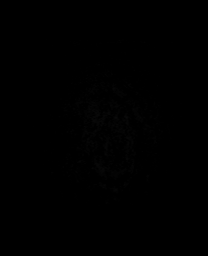

[Series 30: pha_images · axial · 3.0mm · 0.90mm/px · z∈[-127,+31]mm · 4 of 56 slices shown]
[im 1/56]
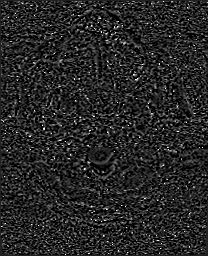
[im 19/56]
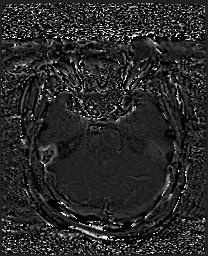
[im 37/56]
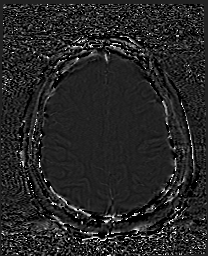
[im 56/56]
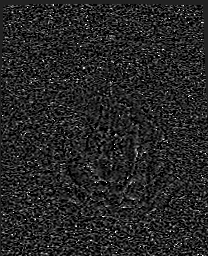

[Series 31: swi_images · axial · 3.0mm · 0.90mm/px · z∈[-127,+31]mm · 4 of 56 slices shown]
[im 1/56]
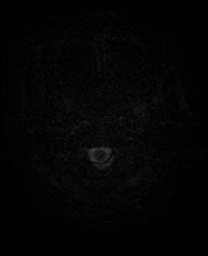
[im 19/56]
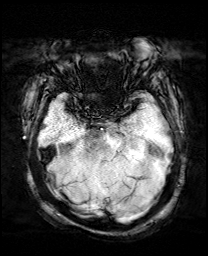
[im 37/56]
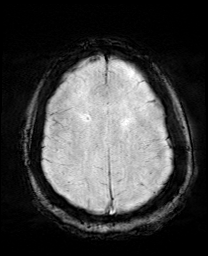
[im 56/56]
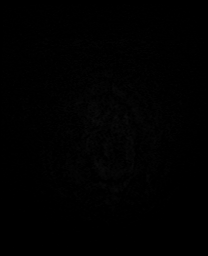

[Series 32: mip_images(sw) · axial · 24.0mm · 0.90mm/px · z∈[-117,+21]mm · 4 of 49 slices shown]
[im 1/49]
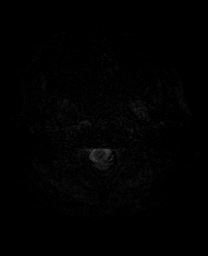
[im 17/49]
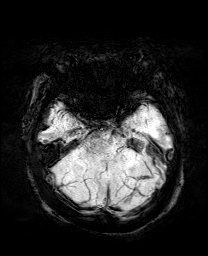
[im 33/49]
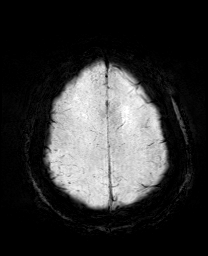
[im 49/49]
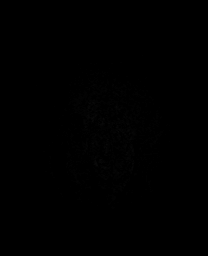

[Series 34: T2 · coronal · 5.0mm · 0.34mm/px · 2 of 29 slices shown (2 of 2)]
[im 1/29]
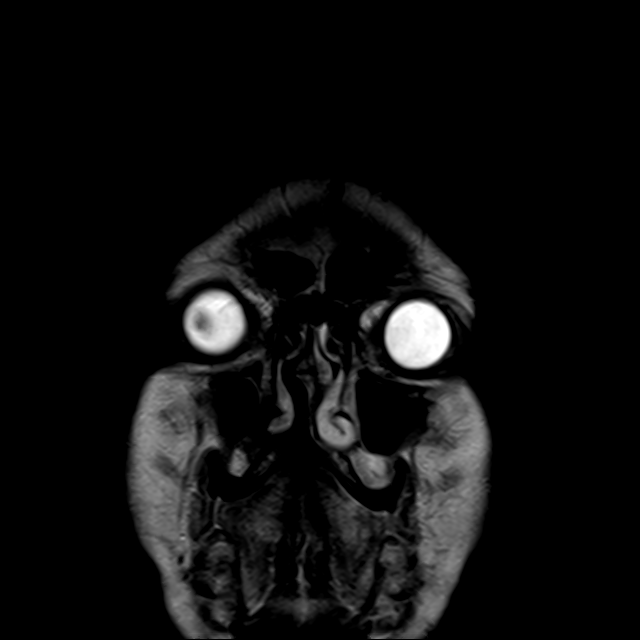
[im 29/29]
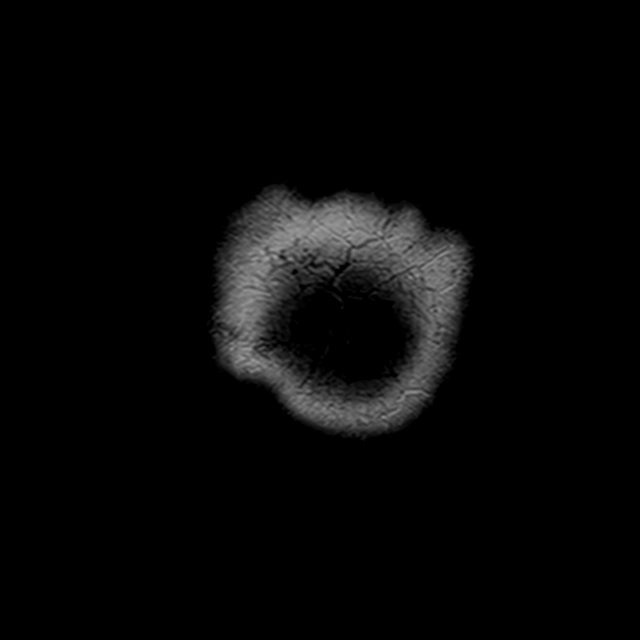

[44 of 48 positions shown; findings below may reference images not displayed]

FINDINGS: MRI HEAD FINDINGS

Brain: Multifocal acute ischemia within the dorsomedial left
thalamus and left cerebral peduncle. There is also a small focus in
the splenium of the corpus callosum and the left basal ganglia.
There are multiple old small vessel infarcts. Multifocal
hyperintense T2-weighted signal within the white matter. Normal
volume of CSF spaces. No chronic microhemorrhage. Normal midline
structures.

Vascular: Normal flow voids.

Skull and upper cervical spine: Normal marrow signal.

Sinuses/Orbits: Negative.

Other: None.

MRA HEAD FINDINGS

POSTERIOR CIRCULATION:

--Vertebral arteries: Normal

--Inferior cerebellar arteries: Normal.

--Basilar artery: Normal.

--Superior cerebellar arteries: Normal.

--Posterior cerebral arteries: The left posterior cerebral artery is
occluded at the P1 2 junction. The right is normal.

ANTERIOR CIRCULATION:

--Intracranial internal carotid arteries: Normal.

--Anterior cerebral arteries (ACA): Normal.

--Middle cerebral arteries (MCA): Normal.

ANATOMIC VARIANTS: Fetal origin of the right PCA.

MRA NECK FINDINGS

Carotid and vertebral arteries are normal.
IMPRESSION: 1. Multifocal acute ischemia within the dorsomedial left thalamus
and left cerebral peduncle. There are also small foci in the
splenium of the corpus callosum and the left basal ganglia.
2. Occlusion of the left posterior cerebral artery at the P1-2
junction.
3. Otherwise normal MRA of the head and neck.

## 2020-03-28 IMAGING — MR MR MRA HEAD W/O CM
1 series · 19 of 48 positions shown · non-contrast
Comparison: None.

CLINICAL DATA: Stroke follow-up



[Series 17: 3d cow · axial · 0.5mm · 0.41mm/px · z∈[-101,-20]mm · 19 of 176 slices shown]
[im 1/176]
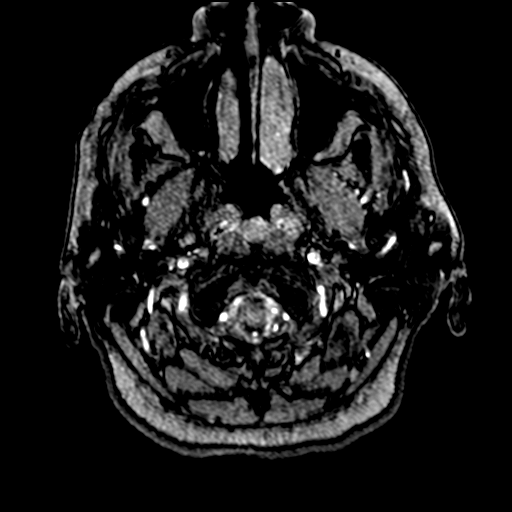
[im 4/176]
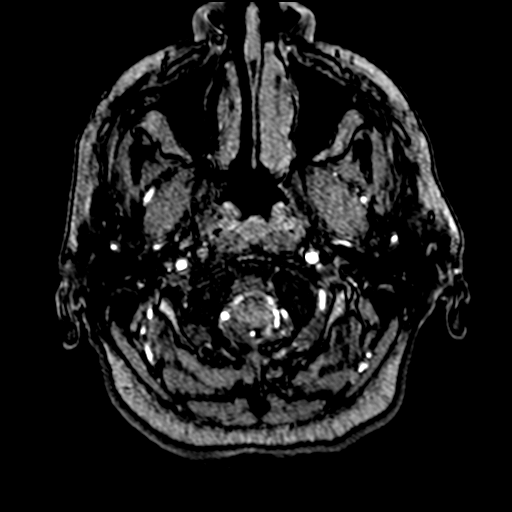
[im 8/176]
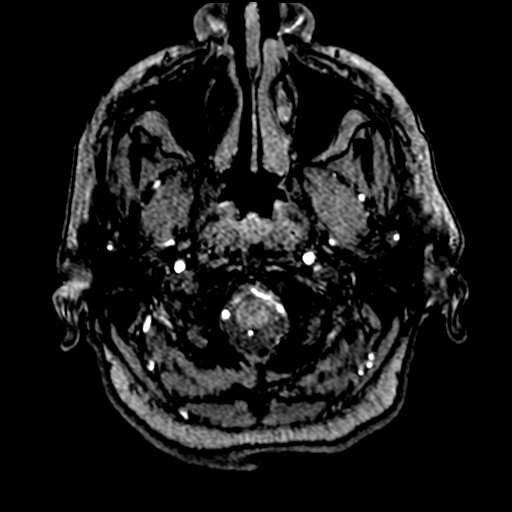
[im 12/176]
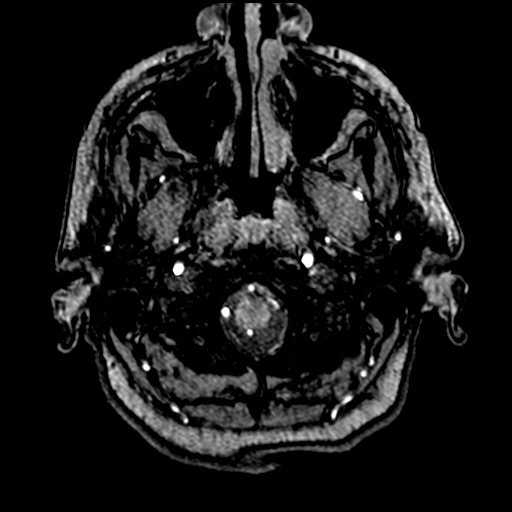
[im 15/176]
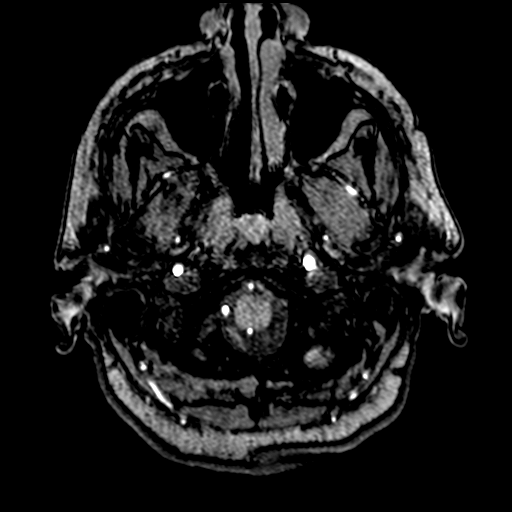
[im 19/176]
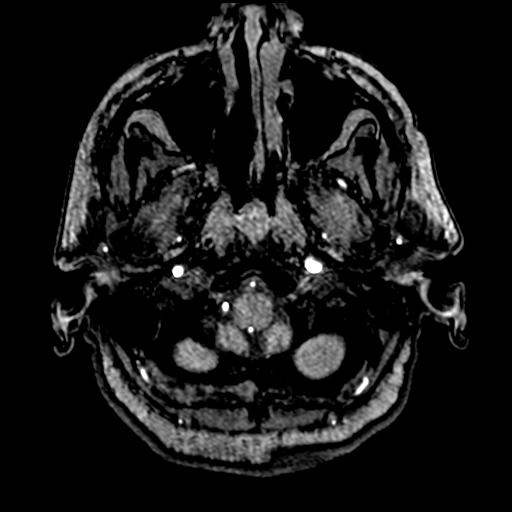
[im 23/176]
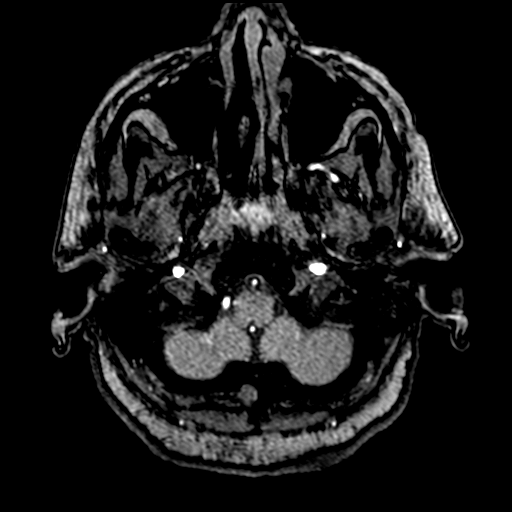
[im 27/176]
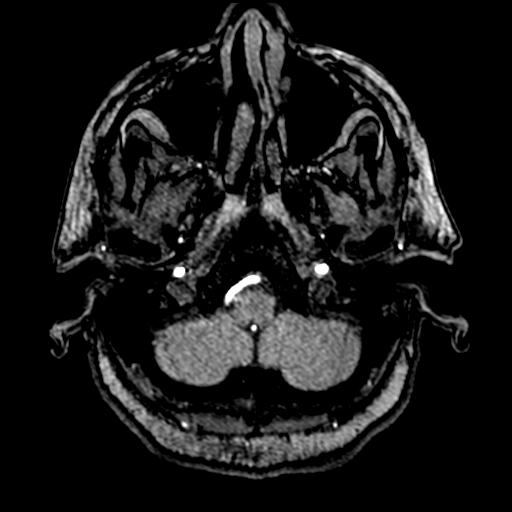
[im 30/176]
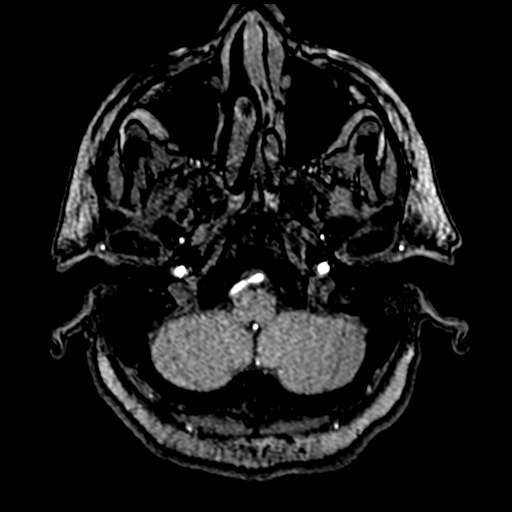
[im 34/176]
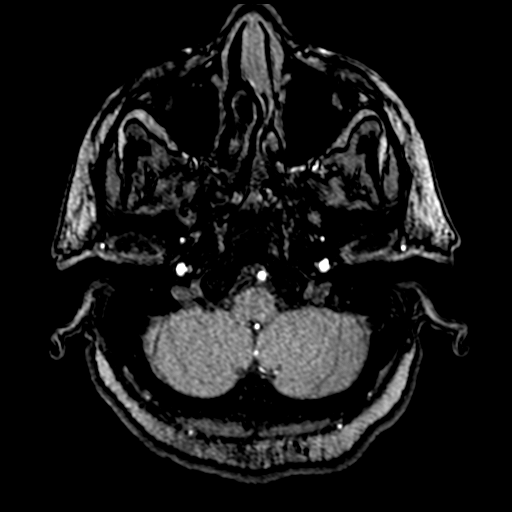
[im 38/176]
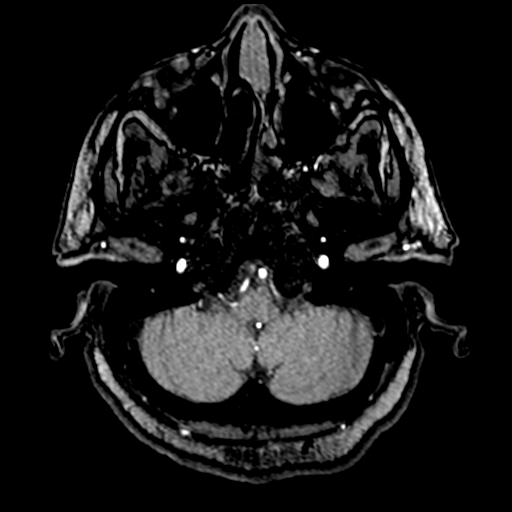
[im 56/176]
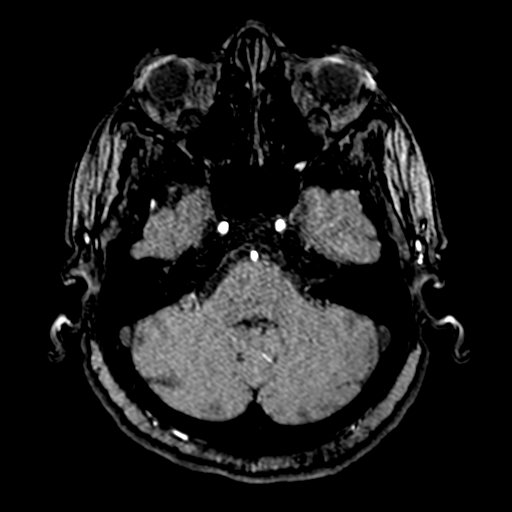
[im 79/176]
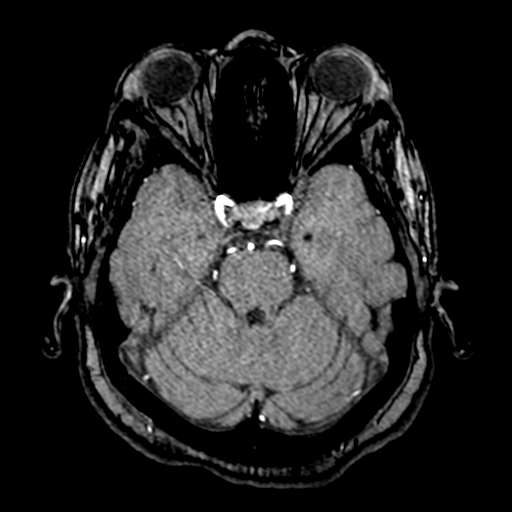
[im 90/176]
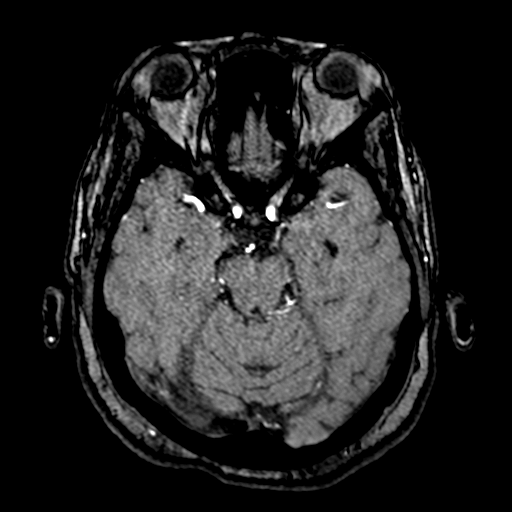
[im 101/176]
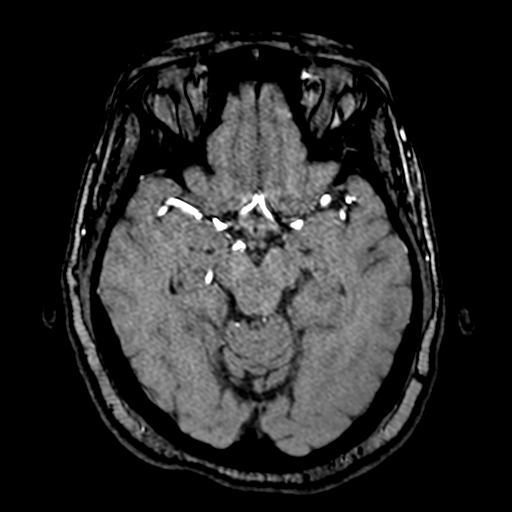
[im 123/176]
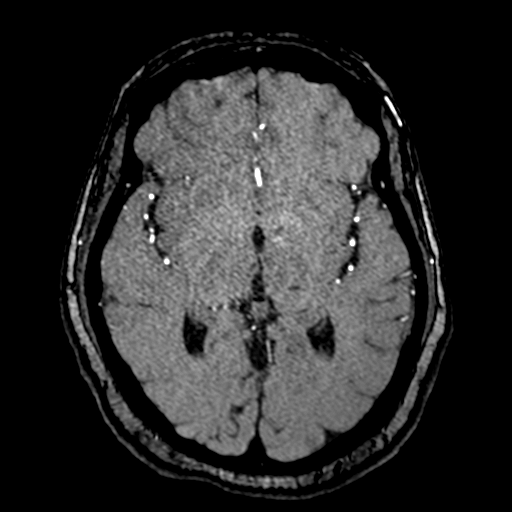
[im 146/176]
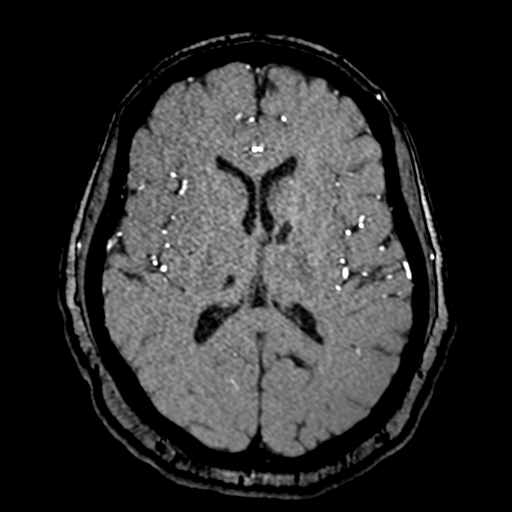
[im 149/176]
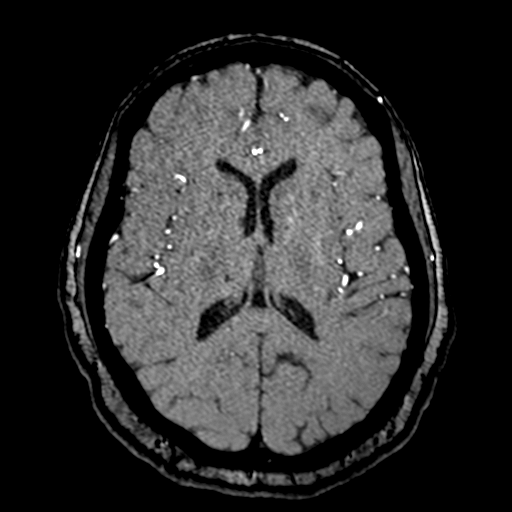
[im 168/176]
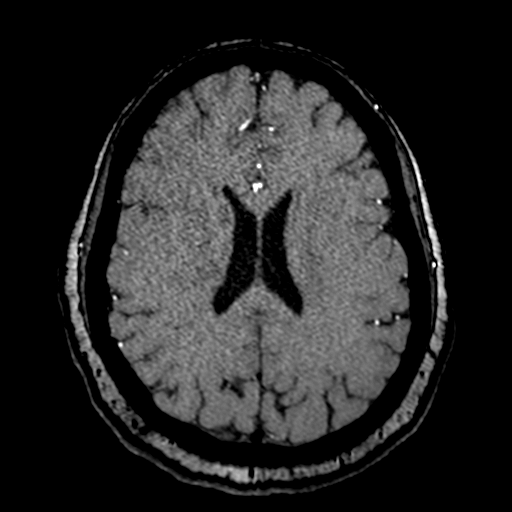

[19 of 48 positions shown; findings below may reference images not displayed]

FINDINGS: MRI HEAD FINDINGS

Brain: Multifocal acute ischemia within the dorsomedial left
thalamus and left cerebral peduncle. There is also a small focus in
the splenium of the corpus callosum and the left basal ganglia.
There are multiple old small vessel infarcts. Multifocal
hyperintense T2-weighted signal within the white matter. Normal
volume of CSF spaces. No chronic microhemorrhage. Normal midline
structures.

Vascular: Normal flow voids.

Skull and upper cervical spine: Normal marrow signal.

Sinuses/Orbits: Negative.

Other: None.

MRA HEAD FINDINGS

POSTERIOR CIRCULATION:

--Vertebral arteries: Normal

--Inferior cerebellar arteries: Normal.

--Basilar artery: Normal.

--Superior cerebellar arteries: Normal.

--Posterior cerebral arteries: The left posterior cerebral artery is
occluded at the P1 2 junction. The right is normal.

ANTERIOR CIRCULATION:

--Intracranial internal carotid arteries: Normal.

--Anterior cerebral arteries (ACA): Normal.

--Middle cerebral arteries (MCA): Normal.

ANATOMIC VARIANTS: Fetal origin of the right PCA.

MRA NECK FINDINGS

Carotid and vertebral arteries are normal.
IMPRESSION: 1. Multifocal acute ischemia within the dorsomedial left thalamus
and left cerebral peduncle. There are also small foci in the
splenium of the corpus callosum and the left basal ganglia.
2. Occlusion of the left posterior cerebral artery at the P1-2
junction.
3. Otherwise normal MRA of the head and neck.

## 2020-03-28 MED ORDER — ATORVASTATIN CALCIUM 40 MG PO TABS
40.0000 mg | ORAL_TABLET | Freq: Every day | ORAL | Status: DC
  Administered 2020-03-28 – 2020-04-01 (×5): 40 mg via ORAL
  Filled 2020-03-28 (×5): qty 1

## 2020-03-28 MED ORDER — ACETAMINOPHEN 160 MG/5ML PO SOLN: 650.0000 mg | ORAL | Status: DC | PRN

## 2020-03-28 MED ORDER — POTASSIUM CHLORIDE 20 MEQ PO PACK
40.0000 meq | PACK | Freq: Once | ORAL | Status: AC
  Administered 2020-03-28: 40 meq via ORAL
  Filled 2020-03-28 (×2): qty 2

## 2020-03-28 MED ORDER — RIVAROXABAN 20 MG PO TABS
20.0000 mg | ORAL_TABLET | Freq: Every day | ORAL | Status: DC
  Filled 2020-03-28: qty 1

## 2020-03-28 MED ORDER — ACETAMINOPHEN 325 MG PO TABS
650.0000 mg | ORAL_TABLET | ORAL | Status: DC | PRN
  Administered 2020-03-31 – 2020-04-01 (×3): 650 mg via ORAL
  Filled 2020-03-28 (×3): qty 2

## 2020-03-28 MED ORDER — CARVEDILOL 12.5 MG PO TABS
25.0000 mg | ORAL_TABLET | Freq: Two times a day (BID) | ORAL | Status: DC
  Administered 2020-03-28 – 2020-04-01 (×9): 25 mg via ORAL
  Filled 2020-03-28 (×9): qty 2

## 2020-03-28 MED ORDER — ACETAMINOPHEN 650 MG RE SUPP: 650.0000 mg | RECTAL | Status: DC | PRN

## 2020-03-28 MED ORDER — POTASSIUM CHLORIDE 20 MEQ PO PACK
20.0000 meq | PACK | Freq: Two times a day (BID) | ORAL | Status: AC
  Administered 2020-03-28 (×2): 20 meq via ORAL
  Filled 2020-03-28 (×2): qty 1

## 2020-03-28 MED ORDER — AMIODARONE HCL 200 MG PO TABS
200.0000 mg | ORAL_TABLET | Freq: Every day | ORAL | Status: DC
  Administered 2020-03-28 – 2020-04-01 (×5): 200 mg via ORAL
  Filled 2020-03-28 (×5): qty 1

## 2020-03-28 MED ORDER — SACUBITRIL-VALSARTAN 24-26 MG PO TABS
1.0000 | ORAL_TABLET | Freq: Two times a day (BID) | ORAL | Status: DC
  Administered 2020-03-28 – 2020-04-01 (×10): 1 via ORAL
  Filled 2020-03-28 (×11): qty 1

## 2020-03-28 MED ORDER — ANASTROZOLE 1 MG PO TABS
1.0000 mg | ORAL_TABLET | Freq: Every day | ORAL | Status: DC
  Administered 2020-03-28 – 2020-04-01 (×5): 1 mg via ORAL
  Filled 2020-03-28 (×5): qty 1

## 2020-03-28 MED ORDER — APIXABAN 5 MG PO TABS
5.0000 mg | ORAL_TABLET | Freq: Two times a day (BID) | ORAL | Status: DC
  Administered 2020-03-28 – 2020-04-01 (×8): 5 mg via ORAL
  Filled 2020-03-28 (×9): qty 1

## 2020-03-28 MED ORDER — STROKE: EARLY STAGES OF RECOVERY BOOK
Freq: Once | Status: AC
  Filled 2020-03-28: qty 1

## 2020-03-28 MED ORDER — GADOBUTROL 1 MMOL/ML IV SOLN
8.0000 mL | Freq: Once | INTRAVENOUS | Status: AC | PRN
  Administered 2020-03-28: 8 mL via INTRAVENOUS

## 2020-03-28 MED ORDER — SODIUM CHLORIDE 0.9 % IV SOLN: INTRAVENOUS | Status: DC

## 2020-03-28 NOTE — Progress Notes (Signed)
  Echocardiogram 2D Echocardiogram has been performed.  Courtney Coleman F 03/28/2020, 9:00 AM

## 2020-03-28 NOTE — Progress Notes (Signed)
PROGRESS NOTE    Courtney Coleman  CHE:[id number] DOB: 24-Apr-1966 DOA: 03/27/2020 PCP: Patient, No Pcp Per   Chief complaint.  Right sided weakness. Brief Narrative:   Courtney Coleman is a 54 y.o. female with history of recently diagnosed breast cancer atrial fibrillation status post cardioversion, CHF unknown EF, hypertension presents to the ER because of right foot drop.  Patient states she had recently moved from [place] to [place] in August and had a breast mass which was biopsied in [place] hospital and was found to be malignant.  During that time patient also was found to have CHF and A. fib.  Patient was cardioverted and placed on amiodarone Coreg and Xarelto.  Patient moved to [facility] for further work-up when patient found that over the last 24 hours patient found it difficult to walk because patient had right foot drop.  Denies any weakness of the lower extremities or difficulty speaking swallowing or any visual symptoms.  Patient appears mildly confused as per the patient's fancy was at the bedside.  ED Course: In the ER patient initially had some weakness in the right foot which resolved.  CT head was unremarkable.  EKG shows normal sinus rhythm.  On-call neurology was consulted and patient admitted for further work-up.  Labs show hemoglobin of 11.6 and potassium of 3.3 blood glucose of 175.  Covid test is negative.  10/30.  MRI of the brain showed multiple ischemic infarcts in the left-sided brain.  MR angiogram showed occluded left PCA branches at the P1 and P2. Echocardiogram showed normal ejection fraction, no source of clots. Patient has been seen by neurology, continued on Xarelto.  Assessment & Plan:   Active Problems:   Right foot drop   CHF (congestive heart failure) (HCC)   Anemia   PAF (paroxysmal atrial fibrillation) (HCC)   Cerebral embolism with cerebral infarction  #1.  Left brain embolic stroke.  Most likely secondary to atrial fibrillation. Patient has been seen  by neurology.  Pending further recommendation following test results.  I discussed with the patient, she has been compliant with her Xarelto.  May need change to a different medicine for anticoagulation.  #2.  Paroxysmal atrial fibrillation. Patient still on amiodarone, Coreg and Xarelto.  3.  Chronic diastolic congestive heart failure. Echocardiogram showed ejection fraction normal.  Continued on beta-blocker as well Entresto.  4.  Breast cancer. We will need to follow-up with oncology as outpatient.  5.  Hypokalemia. Supplemented.   DVT prophylaxis: Xarelto Code Status: Full Family Communication: Fianc at bedside. Disposition Plan:  .   Status is: Observation  The patient remains OBS appropriate and will d/c before 2 midnights.  Dispo: The patient is from: Home              Anticipated d/c is to: Home              Anticipated d/c date is: 1 day              Patient currently is not medically stable to d/c.        I/O last 3 completed shifts: In: 3 [I.V.:3] Out: -  No intake/output data recorded.     Consultants:   Neurology  Procedures: None  Antimicrobials: None  Subjective: Patient still has some weakness in the right side.  No dysphagia.  No visual changes. Denies any chest pain or shortness of breath or cough. No abdominal pain or nausea vomiting. No fever or chills.   Objective: Vitals:   03/28/20  1015 03/28/20 1030 03/28/20 1115 03/28/20 1130  BP: 140/67 (!) 165/95 (!) 157/85 (!) 146/83  Pulse: (!) 56 (!) 59 60 (!) 55  Resp: (!) 21 (!) 24 18 14   Temp:      TempSrc:      SpO2: 99% 99% 99% 99%  Weight:      Height:        Intake/Output Summary (Last 24 hours) at 03/28/2020 1321 Last data filed at 03/28/2020 0548 Gross per 24 hour  Intake 3 ml  Output --  Net 3 ml   Filed Weights   03/27/20 1822  Weight: 83.9 kg    Examination:  General exam: Appears calm and comfortable  Respiratory system: Clear to auscultation. Respiratory  effort normal. Cardiovascular system: S1 & S2 heard, RRR. No JVD, murmurs, rubs, gallops or clicks. No pedal edema. Gastrointestinal system: Abdomen is nondistended, soft and nontender. No organomegaly or masses felt. Normal bowel sounds heard. Central nervous system: Alert and oriented.  Weakness in right leg and arm.. Extremities: Symmetric 5 x 5 power. Skin: No rashes, lesions or ulcers Psychiatry:  Mood & affect appropriate.     Data Reviewed: I have personally reviewed following labs and imaging studies  CBC: Recent Labs  Lab 03/27/20 1831 03/27/20 1855  WBC 8.3  --   NEUTROABS 4.6  --   HGB 11.6* 13.3  HCT 38.5 39.0  MCV 84.2  --   PLT 328  --    Basic Metabolic Panel: Recent Labs  Lab 03/27/20 1831 03/27/20 1855 03/28/20 0751  NA 139 142 139  K 3.3* 3.2* 3.3*  CL 105 104 105  CO2 25  --  24  GLUCOSE 175* 171* 121*  BUN 11 12 16   CREATININE 0.88 0.80 0.82  CALCIUM 9.1  --  9.3  MG  --   --  1.8   GFR: Estimated Creatinine Clearance: 86.6 mL/min (by C-G formula based on SCr of 0.82 mg/dL). Liver Function Tests: Recent Labs  Lab 03/27/20 1831  AST 22  ALT 16  ALKPHOS 58  BILITOT 0.9  PROT 7.9  ALBUMIN 3.1*   No results for input(s): LIPASE, AMYLASE in the last 168 hours. No results for input(s): AMMONIA in the last 168 hours. Coagulation Profile: Recent Labs  Lab 03/27/20 1831  INR 1.2   Cardiac Enzymes: No results for input(s): CKTOTAL, CKMB, CKMBINDEX, TROPONINI in the last 168 hours. BNP (last 3 results) No results for input(s): PROBNP in the last 8760 hours. HbA1C: Recent Labs    03/28/20 0751  HGBA1C 7.2*   CBG: No results for input(s): GLUCAP in the last 168 hours. Lipid Profile: Recent Labs    03/28/20 0751  CHOL 207*  HDL 63  LDLCALC 129*  TRIG 73  CHOLHDL 3.3   Thyroid Function Tests: No results for input(s): TSH, T4TOTAL, FREET4, T3FREE, THYROIDAB in the last 72 hours. Anemia Panel: No results for input(s):  VITAMINB12, FOLATE, FERRITIN, TIBC, IRON, RETICCTPCT in the last 72 hours. Sepsis Labs: No results for input(s): PROCALCITON, LATICACIDVEN in the last 168 hours.  Recent Results (from the past 240 hour(s))  Respiratory Panel by RT PCR (Flu A&B, Covid) - Nasopharyngeal Swab     Status: None   Collection Time: 03/28/20  2:13 AM   Specimen: Nasopharyngeal Swab  Result Value Ref Range Status   SARS Coronavirus 2 by RT PCR NEGATIVE NEGATIVE Final    Comment: (NOTE) SARS-CoV-2 target nucleic acids are NOT DETECTED.  The SARS-CoV-2 RNA is generally detectable  in upper respiratoy specimens during the acute phase of infection. The lowest concentration of SARS-CoV-2 viral copies this assay can detect is 131 copies/mL. A negative result does not preclude SARS-Cov-2 infection and should not be used as the sole basis for treatment or other patient management decisions. A negative result may occur with  improper specimen collection/handling, submission of specimen other than nasopharyngeal swab, presence of viral mutation(s) within the areas targeted by this assay, and inadequate number of viral copies (<131 copies/mL). A negative result must be combined with clinical observations, patient history, and epidemiological information. The expected result is Negative.  Fact Sheet for Patients:  PinkCheek.be  Fact Sheet for Healthcare Providers:  GravelBags.it  This test is no t yet approved or cleared by the [place] FDA and  has been authorized for detection and/or diagnosis of SARS-CoV-2 by FDA under an Emergency Use Authorization (EUA). This EUA will remain  in effect (meaning this test can be used) for the duration of the COVID-19 declaration under Section 564(b)(1) of the Act, 21 U.S.C. section 360bbb-3(b)(1), unless the authorization is terminated or revoked sooner.     Influenza A by PCR NEGATIVE NEGATIVE Final   Influenza B  by PCR NEGATIVE NEGATIVE Final    Comment: (NOTE) The Xpert Xpress SARS-CoV-2/FLU/RSV assay is intended as an aid in  the diagnosis of influenza from Nasopharyngeal swab specimens and  should not be used as a sole basis for treatment. Nasal washings and  aspirates are unacceptable for Xpert Xpress SARS-CoV-2/FLU/RSV  testing.  Fact Sheet for Patients: PinkCheek.be  Fact Sheet for Healthcare Providers: GravelBags.it  This test is not yet approved or cleared by the [place] FDA and  has been authorized for detection and/or diagnosis of SARS-CoV-2 by  FDA under an Emergency Use Authorization (EUA). This EUA will remain  in effect (meaning this test can be used) for the duration of the  Covid-19 declaration under Section 564(b)(1) of the Act, 21  U.S.C. section 360bbb-3(b)(1), unless the authorization is  terminated or revoked. Performed at [facility], [place] [address], [place], [place] [address]          Radiology Studies: CT HEAD WO CONTRAST  Result Date: 03/27/2020 CLINICAL DATA:  Cranial neuropathy, altered mental status, vomiting, temporal headache EXAM: CT HEAD WITHOUT CONTRAST TECHNIQUE: Contiguous axial images were obtained from the base of the skull through the vertex without intravenous contrast. COMPARISON:  None. FINDINGS: Brain: Normal anatomic configuration. There is a remote lacunar infarct versus a neuro epithelial cyst within the left caudothalamic groove. Mild to moderate periventricular white matter changes are present possibly reflecting the sequela of small vessel ischemia. No evidence of acute intracranial hemorrhage or infarct. No abnormal mass effect or midline shift. No abnormal intra or extra-axial mass lesion or fluid collection. Ventricular size is normal. The cerebellum is unremarkable. Vascular: No asymmetric hyperdense vasculature at the skull base. Skull: Intact Sinuses/Orbits: The orbits  are unremarkable. Paranasal sinuses are clear. Other: Mastoid air cells and middle ear cavities are clear. IMPRESSION: Chronic microangiopathic changes, slightly advanced than would be typically expected for a patient of this age. Correlation for neuro degenerative risk factors may be helpful for further management. No evidence of acute intracranial hemorrhage or infarct. Electronically Signed   By: Fidela Salisbury MD   On: 03/27/2020 19:59   MR ANGIO HEAD WO CONTRAST  Result Date: 03/28/2020 CLINICAL DATA:  Stroke follow-up EXAM: MRI HEAD WITHOUT CONTRAST MRA HEAD WITHOUT CONTRAST MRA NECK WITHOUT CONTRAST TECHNIQUE:  Multiplanar, multiecho pulse sequences of the brain and surrounding structures were obtained without intravenous contrast. Angiographic images of the Circle of Willis were obtained using MRA technique without intravenous contrast. Angiographic images of the neck were obtained using MRA technique without intravenous contrast. Carotid stenosis measurements (when applicable) are obtained utilizing NASCET criteria, using the distal internal carotid diameter as the denominator. COMPARISON:  None. FINDINGS: MRI HEAD FINDINGS Brain: Multifocal acute ischemia within the dorsomedial left thalamus and left cerebral peduncle. There is also a small focus in the splenium of the corpus callosum and the left basal ganglia. 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 Multifocal hyperintense T2-weighted signal within the white matter. Normal volume of CSF spaces. No chronic microhemorrhage. Normal midline structures. Vascular: Normal flow voids. Skull and upper cervical spine: Normal marrow signal. Sinuses/Orbits: Negative. Other: None. MRA HEAD FINDINGS POSTERIOR CIRCULATION: --Vertebral arteries: Normal --Inferior cerebellar arteries: Normal. --Basilar artery: Normal. --Superior cerebellar arteries: Normal. --Posterior cerebral arteries: The left posterior cerebral artery is occluded at the P1 2 junction. The  right is normal. ANTERIOR CIRCULATION: --Intracranial internal carotid arteries: Normal. --Anterior cerebral arteries (ACA): Normal. --Middle cerebral arteries (MCA): Normal. ANATOMIC VARIANTS: Fetal origin of the right PCA. MRA NECK FINDINGS Carotid and vertebral arteries are normal. IMPRESSION: 1. Multifocal acute ischemia within the dorsomedial left thalamus and left cerebral peduncle. There are also small foci in the splenium of the corpus callosum and the left basal ganglia. 2. 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 3. Otherwise normal MRA of the head and neck. Electronically Signed   By: Ulyses Jarred M.D.   On: 03/28/2020 05:05   MR ANGIO NECK WO CONTRAST  Result Date: 03/28/2020 CLINICAL DATA:  Stroke follow-up EXAM: MRI HEAD WITHOUT CONTRAST MRA HEAD WITHOUT CONTRAST MRA NECK WITHOUT CONTRAST TECHNIQUE: Multiplanar, multiecho pulse sequences of the brain and surrounding structures were obtained without intravenous contrast. Angiographic images of the Circle of Willis were obtained using MRA technique without intravenous contrast. Angiographic images of the neck were obtained using MRA technique without intravenous contrast. Carotid stenosis measurements (when applicable) are obtained utilizing NASCET criteria, using the distal internal carotid diameter as the denominator. COMPARISON:  None. FINDINGS: MRI HEAD FINDINGS Brain: Multifocal acute ischemia within the dorsomedial left thalamus and left cerebral peduncle. There is also a small focus in the splenium of the corpus callosum and the left basal ganglia. 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 Multifocal hyperintense T2-weighted signal within the white matter. Normal volume of CSF spaces. No chronic microhemorrhage. Normal midline structures. Vascular: Normal flow voids. Skull and upper cervical spine: Normal marrow signal. Sinuses/Orbits: Negative. Other: None. MRA HEAD FINDINGS POSTERIOR CIRCULATION:  --Vertebral arteries: Normal --Inferior cerebellar arteries: Normal. --Basilar artery: Normal. --Superior cerebellar arteries: Normal. --Posterior cerebral arteries: The left posterior cerebral artery is occluded at the P1 2 junction. The right is normal. ANTERIOR CIRCULATION: --Intracranial internal carotid arteries: Normal. --Anterior cerebral arteries (ACA): Normal. --Middle cerebral arteries (MCA): Normal. ANATOMIC VARIANTS: Fetal origin of the right PCA. MRA NECK FINDINGS Carotid and vertebral arteries are normal. IMPRESSION: 1. Multifocal acute ischemia within the dorsomedial left thalamus and left cerebral peduncle. There are also small foci in the splenium of the corpus callosum and the left basal ganglia. 2. 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 3. Otherwise normal MRA of the head and neck. Electronically Signed   By: Ulyses Jarred M.D.   On: 03/28/2020 05:05   MR BRAIN WO CONTRAST  Result  Date: 03/28/2020 CLINICAL DATA:  Stroke follow-up EXAM: MRI HEAD WITHOUT CONTRAST MRA HEAD WITHOUT CONTRAST MRA NECK WITHOUT CONTRAST TECHNIQUE: Multiplanar, multiecho pulse sequences of the brain and surrounding structures were obtained without intravenous contrast. Angiographic images of the Circle of Willis were obtained using MRA technique without intravenous contrast. Angiographic images of the neck were obtained using MRA technique without intravenous contrast. Carotid stenosis measurements (when applicable) are obtained utilizing NASCET criteria, using the distal internal carotid diameter as the denominator. COMPARISON:  None. FINDINGS: MRI HEAD FINDINGS Brain: Multifocal acute ischemia within the dorsomedial left thalamus and left cerebral peduncle. There is also a small focus in the splenium of the corpus callosum and the left basal ganglia. 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 Multifocal hyperintense T2-weighted signal within the white matter. Normal volume of CSF  spaces. No chronic microhemorrhage. Normal midline structures. Vascular: Normal flow voids. Skull and upper cervical spine: Normal marrow signal. Sinuses/Orbits: Negative. Other: None. MRA HEAD FINDINGS POSTERIOR CIRCULATION: --Vertebral arteries: Normal --Inferior cerebellar arteries: Normal. --Basilar artery: Normal. --Superior cerebellar arteries: Normal. --Posterior cerebral arteries: The left posterior cerebral artery is occluded at the P1 2 junction. The right is normal. ANTERIOR CIRCULATION: --Intracranial internal carotid arteries: Normal. --Anterior cerebral arteries (ACA): Normal. --Middle cerebral arteries (MCA): Normal. ANATOMIC VARIANTS: Fetal origin of the right PCA. MRA NECK FINDINGS Carotid and vertebral arteries are normal. IMPRESSION: 1. Multifocal acute ischemia within the dorsomedial left thalamus and left cerebral peduncle. There are also small foci in the splenium of the corpus callosum and the left basal ganglia. 2. 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 3. Otherwise normal MRA of the head and neck. Electronically Signed   By: Ulyses Jarred M.D.   On: 03/28/2020 05:05   ECHOCARDIOGRAM COMPLETE  Result Date: 03/28/2020    ECHOCARDIOGRAM REPORT   Patient Name:   [employer] Date of Exam: 03/28/2020 Medical Rec #:  [id number]    Height:       66.0 in Accession #:    [id number]   Weight:       185.0 lb Date of Birth:  [birth year]   BSA:          1.935 m Patient Age:    16 years     BP:           172/78 mmHg Patient Gender: F            HR:           61 bpm. Exam Location:  Inpatient Procedure: 2D Echo, Cardiac Doppler and Color Doppler Indications:    Stroke 434.91/ I163.9  History:        Patient has no prior history of Echocardiogram examinations.  Sonographer:    Merrie Roof RDCS Referring Phys: [place]  1. Left ventricular ejection fraction, by estimation, is 60 to 65%. The left ventricle has normal function. The left ventricle  has no regional wall motion abnormalities. Left ventricular diastolic parameters are consistent with Grade I diastolic dysfunction (impaired relaxation).  2. Right ventricular systolic function is normal. The right ventricular size is normal. There is normal pulmonary artery systolic pressure.  3. Left atrial size was severely dilated.  4. The mitral valve is normal in structure. Trivial mitral valve regurgitation. No evidence of mitral stenosis.  5. The aortic valve is tricuspid. Aortic valve regurgitation is not visualized. Mild to moderate aortic valve sclerosis/calcification is present, without any evidence of aortic stenosis.  6. The  inferior vena cava is normal in size with greater than 50% respiratory variability, suggesting right atrial pressure of 3 mmHg. FINDINGS  Left Ventricle: Left ventricular ejection fraction, by estimation, is 60 to 65%. The left ventricle has normal function. The left ventricle has no regional wall motion abnormalities. The left ventricular internal cavity size was normal in size. There is  no left ventricular hypertrophy. Left ventricular diastolic parameters are consistent with Grade I diastolic dysfunction (impaired relaxation). Normal left ventricular filling pressure. Right Ventricle: The right ventricular size is normal. No increase in right ventricular wall thickness. Right ventricular systolic function is normal. There is normal pulmonary artery systolic pressure. The tricuspid regurgitant velocity is 2.05 m/s, and  with an assumed right atrial pressure of 3 mmHg, the estimated right ventricular systolic pressure is 22.2 mmHg. Left Atrium: Left atrial size was severely dilated. Right Atrium: Right atrial size was normal in size. Pericardium: There is no evidence of pericardial effusion. Mitral Valve: The mitral valve is normal in structure. Mild mitral annular calcification. Trivial mitral valve regurgitation. No evidence of mitral valve stenosis. Tricuspid Valve: The  tricuspid valve is normal in structure. Tricuspid valve regurgitation is trivial. No evidence of tricuspid stenosis. Aortic Valve: The aortic valve is tricuspid. Aortic valve regurgitation is not visualized. Mild to moderate aortic valve sclerosis/calcification is present, without any evidence of aortic stenosis. Aortic valve mean gradient measures 6.0 mmHg. Aortic valve peak gradient measures 12.1 mmHg. Aortic valve area, by VTI measures 2.43 cm. Pulmonic Valve: The pulmonic valve was normal in structure. Pulmonic valve regurgitation is trivial. No evidence of pulmonic stenosis. Aorta: The aortic root is normal in size and structure. Venous: The inferior vena cava is normal in size with greater than 50% respiratory variability, suggesting right atrial pressure of 3 mmHg. IAS/Shunts: No atrial level shunt detected by color flow Doppler.  LEFT VENTRICLE PLAX 2D LVIDd:         4.90 cm      Diastology LVIDs:         3.20 cm      LV e' medial:    3.81 cm/s LV PW:         1.00 cm      LV E/e' medial:  12.8 LV IVS:        1.00 cm      LV e' lateral:   6.64 cm/s LVOT diam:     2.00 cm      LV E/e' lateral: 7.3 LV SV:         74 LV SV Index:   38 LVOT Area:     3.14 cm  LV Volumes (MOD) LV vol d, MOD A4C: 107.0 ml LV vol s, MOD A4C: 47.1 ml LV SV MOD A4C:     107.0 ml RIGHT VENTRICLE RV Basal diam:  3.60 cm LEFT ATRIUM              Index       RIGHT ATRIUM           Index LA diam:        3.90 cm  2.02 cm/m  RA Area:     17.00 cm LA Vol (A2C):   123.0 ml 63.58 ml/m RA Volume:   46.80 ml  24.19 ml/m LA Vol (A4C):   90.5 ml  46.78 ml/m LA Biplane Vol: 106.0 ml 54.79 ml/m  AORTIC VALVE AV Area (Vmax):    2.33 cm AV Area (Vmean):   2.56 cm AV Area (VTI):  2.43 cm AV Vmax:           174.00 cm/s AV Vmean:          114.000 cm/s AV VTI:            0.303 m AV Peak Grad:      12.1 mmHg AV Mean Grad:      6.0 mmHg LVOT Vmax:         129.00 cm/s LVOT Vmean:        92.800 cm/s LVOT VTI:          0.234 m LVOT/AV VTI ratio:  0.77  AORTA Ao Root diam: 2.90 cm Ao Asc diam:  3.60 cm MITRAL VALVE               TRICUSPID VALVE MV Area (PHT): 3.01 cm    TR Peak grad:   16.8 mmHg MV Decel Time: 252 msec    TR Vmax:        205.00 cm/s MV E velocity: 48.80 cm/s MV A velocity: 74.60 cm/s  SHUNTS MV E/A ratio:  0.65        Systemic VTI:  0.23 m                            Systemic Diam: 2.00 cm Fransico Him MD Electronically signed by Fransico Him MD Signature Date/Time: 03/28/2020/12:02:27 PM    Final         Scheduled Meds: .  stroke: mapping our early stages of recovery book   Does not apply Once  . amiodarone  200 mg Oral Daily  . anastrozole  1 mg Oral Daily  . atorvastatin  40 mg Oral Daily  . carvedilol  25 mg Oral BID WC  . potassium chloride  20 mEq Oral BID  . rivaroxaban  20 mg Oral Q supper  . sacubitril-valsartan  1 tablet Oral BID   Continuous Infusions:   LOS: 0 days    Time spent:     Sharen Hones, MD [facility]   To contact the attending provider between 7A-7P or the covering provider during after hours 7P-7A, please log into the web site [email or web address] and access using universal [employer] password for that web site. If you do not have the password, please call the hospital operator.  03/28/2020, 1:21 PM

## 2020-03-28 NOTE — Evaluation (Signed)
Occupational Therapy Evaluation Patient Details Name: Courtney Coleman MRN: [id number] DOB: 16-May-1966 Today's Date: 03/28/2020    History of Present Illness 54 y.o. female  PMHx arrhythmia (xeralto), HTN, CHF and breast cancer (biopsy proven on arimidex) with right sided weakness and numbness. MRI- Multifocal acute ischemia within the dorsomedial left thalamus and left cerebral peduncle; also small foci in the splenium of the corpus callosum and the left basal ganglia; occlusion Lt posterior cerebral artery.    Clinical Impression   PTA pt living with family and functioning at independent community level. At time of eval, pt able to complete bed mobility with supervision level assist and sit <> stands with min A and RW for steadying. Pt began functional mobility without UE support of RW initially, with notable R foot drop and poor dynamic balance. Balance and safety improved with RW support. Also noted mild RUE deficits with proprioception, coordination, and strength. Pt was able to don socks, but relied more heavily on L hand (nondominant) due to poor ability to bimanually coordinate. Also noted some questionable R peripheral visual deficits with disorganized scanning (See below) that will be further assessed. Pt required increased cues to problem solve and increase awareness of current deficits throughout session. Fiance present and supportive for education. Given current status, recommend OP neuro OT for continued progression of neurological deficits described above. Will continue to follow.     Follow Up Recommendations  Outpatient OT;Other (comment) (neuro)    Equipment Recommendations  3 in 1 bedside commode    Recommendations for Other Services       Precautions / Restrictions Precautions Precautions: Fall Restrictions Weight Bearing Restrictions: No      Mobility Bed Mobility Overal bed mobility: Needs Assistance Bed Mobility: Supine to Sit     Supine to sit: Supervision      General bed mobility comments: incr time and effort (on ED stretcher)    Transfers Overall transfer level: Needs assistance Equipment used: None Transfers: Sit to/from Stand Sit to Stand: Min assist;From elevated surface         General transfer comment: assist to rise and steady    Balance Overall balance assessment: Needs assistance Sitting-balance support: Bilateral upper extremity supported;Feet supported Sitting balance-Leahy Scale: Fair     Standing balance support: Bilateral upper extremity supported;During functional activity Standing balance-Leahy Scale: Fair Standing balance comment: pt more stable with RW and BUE support. Loses balance quickly with dynamic tasks or challenge out of BOS                           ADL either performed or assessed with clinical judgement   ADL Overall ADL's : Needs assistance/impaired Eating/Feeding: Independent;Sitting   Grooming: Independent;Sitting   Upper Body Bathing: Set up;Sitting   Lower Body Bathing: Minimal assistance;Sit to/from stand;Sitting/lateral leans   Upper Body Dressing : Set up;Sitting   Lower Body Dressing: Minimal assistance;Sit to/from stand;Sitting/lateral leans Lower Body Dressing Details (indicate cue type and reason): pt was able to don bil socks with set up assist (did struggle using R hand to pull up at sock), requires min A when sit <> stand to pull up pants due to unsteadiness Toilet Transfer: Min guard;Minimal assistance;RW;Ambulation;Regular Toilet;Grab bars   Toileting- Clothing Manipulation and Hygiene: Set up;Sitting/lateral lean;Sit to/from stand       Functional mobility during ADLs: Min guard;Minimal assistance;Cueing for safety;Cueing for sequencing;Rolling walker       Vision Baseline Vision/History: No visual deficits Patient Visual  Report: No change from baseline Vision Assessment?: Yes;Vision impaired- to be further tested in functional context Tracking/Visual  Pursuits: Requires cues, head turns, or add eye shifts to track;Impaired - to be further tested in functional context;Other (comment) (disorganized) Visual Fields: Impaired-to be further tested in functional context Additional Comments: Pt presenting with disorganized scanning throughout visual testing. Appears to have difficulty with L/R discrimination (cognition may also be factor). Noted mild R peripheral field deficits, pt unaware and stating her vision feels normal. Will continue to assess     Perception     Praxis      Pertinent Vitals/Pain Pain Assessment: No/denies pain     Hand Dominance Right   Extremity/Trunk Assessment Upper Extremity Assessment Upper Extremity Assessment: Generalized weakness;RUE deficits/detail RUE Deficits / Details: mild decrease in proprioception; difficulty pulling up sock and using L hand when pt is R hand dominant RUE Coordination: decreased fine motor   Lower Extremity Assessment Lower Extremity Assessment: Defer to PT evaluation RLE Deficits / Details: ankle DF 4/5 RLE Sensation: decreased light touch (75% compared to LLE) RLE Coordination: decreased fine motor (rt shoe slipping off her foot while walking (slip-on))   Cervical / Trunk Assessment Cervical / Trunk Assessment: Other exceptions Cervical / Trunk Exceptions: obese   Communication Communication Communication: Expressive difficulties (word finding difficulties)   Cognition Arousal/Alertness: Awake/alert Behavior During Therapy: Flat affect Overall Cognitive Status: Impaired/Different from baseline Area of Impairment: Problem solving;Safety/judgement                         Safety/Judgement: Decreased awareness of deficits   Problem Solving: Slow processing;Decreased initiation;Requires verbal cues General Comments: able to fully orient self. Noted decereased processing/problem solving speed requiring supplemental cues to complete basic tasks. Decreased awareness of  current deficits unless cued   General Comments  supine HR 58 BP 156/80; sit after walk HR 65 BP 167/86    Exercises     Shoulder Instructions      Home Living Family/patient expects to be discharged to:: Private residence Living Arrangements: Other relatives;Spouse/significant other (sister, niece, nephew) Available Help at Discharge: Family;Available 24 hours/day Type of Home: House Home Access: Stairs to enter [employer] of Steps: 1   Home Layout: Two level;Bed/bath upstairs;1/2 bath on main level;Able to live on main level with bedroom/bathroom Alternate Level Stairs-Number of Steps: 15 Alternate Level Stairs-Rails: Left Bathroom Shower/Tub: Occupational psychologist: Standard     Home Equipment: Shower seat - built in;Grab bars - tub/shower          Prior Functioning/Environment Level of Independence: Independent        Comments: not working         OT Problem List: Decreased strength;Decreased knowledge of use of DME or AE;Impaired vision/perception;Decreased knowledge of precautions;Decreased coordination;Decreased activity tolerance;Decreased cognition;Impaired UE functional use;Impaired balance (sitting and/or standing);Decreased safety awareness      OT Treatment/Interventions: Self-care/ADL training;Therapeutic exercise;Patient/family education;Visual/perceptual remediation/compensation;Balance training;Neuromuscular education;Energy conservation;Therapeutic activities;DME and/or AE instruction;Cognitive remediation/compensation    OT Goals(Current goals can be found in the care plan section) Acute Rehab OT Goals Patient Stated Goal: return home OT Goal Formulation: With patient/family Time For Goal Achievement: 04/11/20 Potential to Achieve Goals: Good  OT Frequency: Min 2X/week   Barriers to D/C:            Co-evaluation PT/OT/SLP Co-Evaluation/Treatment: Yes Reason for Co-Treatment: For patient/therapist safety;To address  functional/ADL transfers PT goals addressed during session: Mobility/safety with mobility;Balance;Proper use of DME OT  goals addressed during session: ADL's and self-care;Proper use of Adaptive equipment and DME;Strengthening/ROM      AM-PAC OT "6 Clicks" Daily Activity     Outcome Measure Help from another person eating meals?: A Little Help from another person taking care of personal grooming?: A Little Help from another person toileting, which includes using toliet, bedpan, or urinal?: A Little Help from another person bathing (including washing, rinsing, drying)?: A Little Help from another person to put on and taking off regular upper body clothing?: A Little Help from another person to put on and taking off regular lower body clothing?: A Little 6 Click Score: 18   End of Session Equipment Utilized During Treatment: Gait belt;Rolling walker Nurse Communication: Mobility status  Activity Tolerance:   Patient left: in bed;with call bell/phone within reach;with family/visitor present  OT Visit Diagnosis: Unsteadiness on feet (R26.81);Other abnormalities of gait and mobility (R26.89);Muscle weakness (generalized) (M62.81);Other symptoms and signs involving cognitive function                Time: 7425-9563 OT Time Calculation (min): 36 min Charges:  OT General Charges $OT Visit: 1 Visit OT Evaluation $OT Eval Moderate Complexity: [place], MSOT, OTR/L Acute Rehabilitation Services [facility] Office Number: [phone] Pager: [phone]  Zenovia Jarred 03/28/2020, 9:56 AM

## 2020-03-28 NOTE — ED Notes (Signed)
Patient back from MRI.

## 2020-03-28 NOTE — ED Notes (Signed)
Patient transported to MRI 

## 2020-03-28 NOTE — H&P (Signed)
History and Physical    Courtney Coleman HCW:[id number] DOB: February 26, 1966 DOA: 03/27/2020  PCP: Patient, No Pcp Per  Patient coming from: Home.  Chief Complaint: Right foot drop.  HPI: Courtney Coleman is a 53 y.o. female with history of recently diagnosed breast cancer atrial fibrillation status post cardioversion, CHF unknown EF, hypertension presents to the ER because of right foot drop.  Patient states she had recently moved from [place] to [place] in August and had a breast mass which was biopsied in [place] hospital and was found to be malignant.  During that time patient also was found to have CHF and A. fib.  Patient was cardioverted and placed on amiodarone Coreg and Xarelto.  Patient moved to [facility] for further work-up when patient found that over the last 24 hours patient found it difficult to walk because patient had right foot drop.  Denies any weakness of the lower extremities or difficulty speaking swallowing or any visual symptoms.  Patient appears mildly confused as per the patient's fancy was at the bedside.  ED Course: In the ER patient initially had some weakness in the right foot which resolved.  CT head was unremarkable.  EKG shows normal sinus rhythm.  On-call neurology was consulted and patient admitted for further work-up.  Labs show hemoglobin of 11.6 and potassium of 3.3 blood glucose of 175.  Covid test is negative.  Review of Systems: As per HPI, rest all negative.   Past Medical History:  Diagnosis Date  . Breast cancer ([place])   . CHF (congestive heart failure) ([place])   . Dysrhythmia   . Hypertension     Past Surgical History:  Procedure Laterality Date  . BREAST BIOPSY       reports that she has never smoked. She has never used smokeless tobacco. She reports previous alcohol use. She reports previous drug use.  No Known Allergies  Family History  Problem Relation Age of Onset  . Cancer Neg Hx     Prior to Admission medications   Medication Sig Start  Date End Date Taking? Authorizing Provider  amiodarone (PACERONE) 200 MG tablet Take 200 mg by mouth daily.   Yes [provider]  anastrozole (ARIMIDEX) 1 MG tablet Take 1 mg by mouth daily.   Yes [provider]  carvedilol (COREG) 25 MG tablet Take 25 mg by mouth 2 (two) times daily with a meal.   Yes [provider]  rivaroxaban (XARELTO) 20 MG TABS tablet Take 20 mg by mouth daily with supper.   Yes [provider]  sacubitril-valsartan (ENTRESTO) 24-26 MG Take 1 tablet by mouth 2 (two) times daily.   Yes [provider]    Physical Exam: Constitutional: Moderately built and nourished. Vitals:   03/28/20 0200 03/28/20 0215 03/28/20 0230 03/28/20 0245  BP: (!) 165/74 (!) 169/84 (!) 179/83 (!) 187/87  Pulse: (!) 57 (!) 56 (!) 55 (!) 51  Resp: 19 20    Temp:      TempSrc:      SpO2: 98% 98% 98% 97%  Weight:      Height:       Eyes: Anicteric no pallor. ENMT: No discharge from the ears eyes nose or mouth. Neck: No mass felt.  No neck rigidity. Respiratory: No rhonchi or crepitations. Cardiovascular: S1-S2 heard. Abdomen: Soft nontender bowel sounds present. Musculoskeletal: No edema. Skin: No rash. Neurologic: Alert awake oriented to name and place moves all extremities 5 x 5 no facial asymmetry tongue is midline.  Pupils equal and reacting to light. Psychiatric: Appears normal.  Normal affect.   Labs on Admission: I have personally reviewed following labs and imaging studies  CBC: Recent Labs  Lab 03/27/20 1831 03/27/20 1855  WBC 8.3  --   NEUTROABS 4.6  --   HGB 11.6* 13.3  HCT 38.5 39.0  MCV 84.2  --   PLT 328  --    Basic Metabolic Panel: Recent Labs  Lab 03/27/20 1831 03/27/20 1855  NA 139 142  K 3.3* 3.2*  CL 105 104  CO2 25  --   GLUCOSE 175* 171*  BUN 11 12  CREATININE 0.88 0.80  CALCIUM 9.1  --    GFR: Estimated Creatinine Clearance: 88.7 mL/min (by C-G formula based on SCr of 0.8 mg/dL). Liver  Function Tests: Recent Labs  Lab 03/27/20 1831  AST 22  ALT 16  ALKPHOS 58  BILITOT 0.9  PROT 7.9  ALBUMIN 3.1*   No results for input(s): LIPASE, AMYLASE in the last 168 hours. No results for input(s): AMMONIA in the last 168 hours. Coagulation Profile: Recent Labs  Lab 03/27/20 1831  INR 1.2   Cardiac Enzymes: No results for input(s): CKTOTAL, CKMB, CKMBINDEX, TROPONINI in the last 168 hours. BNP (last 3 results) No results for input(s): PROBNP in the last 8760 hours. HbA1C: No results for input(s): HGBA1C in the last 72 hours. CBG: No results for input(s): GLUCAP in the last 168 hours. Lipid Profile: No results for input(s): CHOL, HDL, LDLCALC, TRIG, CHOLHDL, LDLDIRECT in the last 72 hours. Thyroid Function Tests: No results for input(s): TSH, T4TOTAL, FREET4, T3FREE, THYROIDAB in the last 72 hours. Anemia Panel: No results for input(s): VITAMINB12, FOLATE, FERRITIN, TIBC, IRON, RETICCTPCT in the last 72 hours. Urine analysis: No results found for: COLORURINE, APPEARANCEUR, LABSPEC, PHURINE, GLUCOSEU, HGBUR, BILIRUBINUR, KETONESUR, PROTEINUR, UROBILINOGEN, NITRITE, LEUKOCYTESUR Sepsis Labs: @LABRCNTIP (procalcitonin:4,lacticidven:4) )No results found for this or any previous visit (from the past 240 hour(s)).   Radiological Exams on Admission: CT HEAD WO CONTRAST  Result Date: 03/27/2020 CLINICAL DATA:  Cranial neuropathy, altered mental status, vomiting, temporal headache EXAM: CT HEAD WITHOUT CONTRAST TECHNIQUE: Contiguous axial images were obtained from the base of the skull through the vertex without intravenous contrast. COMPARISON:  None. FINDINGS: Brain: Normal anatomic configuration. There is a remote lacunar infarct versus a neuro epithelial cyst within the left caudothalamic groove. Mild to moderate periventricular white matter changes are present possibly reflecting the sequela of small vessel ischemia. No evidence of acute intracranial hemorrhage or infarct.  No abnormal mass effect or midline shift. No abnormal intra or extra-axial mass lesion or fluid collection. Ventricular size is normal. The cerebellum is unremarkable. Vascular: No asymmetric hyperdense vasculature at the skull base. Skull: Intact Sinuses/Orbits: The orbits are unremarkable. Paranasal sinuses are clear. Other: Mastoid air cells and middle ear cavities are clear. IMPRESSION: Chronic microangiopathic changes, slightly advanced than would be typically expected for a patient of this age. Correlation for neuro degenerative risk factors may be helpful for further management. No evidence of acute intracranial hemorrhage or infarct. Electronically Signed   By: Fidela Salisbury MD   On: 03/27/2020 19:59    EKG: Independently reviewed.  Normal sinus rhythm.  Assessment/Plan Active Problems:   Right foot drop   CHF (congestive heart failure) (HCC)   Anemia   PAF (paroxysmal atrial fibrillation) ([place])    1. Right foot drop which has resolved at this time.  Discussed with neurologist on-call Dr. Theda Sers at this time differentials include  possible stroke versus paraneoplastic syndrome.  MRI brain has been ordered.  Further work-up based on MRI.  Patient did pass stroke swallow.  Neurochecks. 2. History of paroxysmal atrial fibrillation status post cardioversion in August 2021 at [place] as per the patient we do not have any records for this.  Presently on amiodarone Coreg and Xarelto.  Rate controlled. 3. History of CHF appears compensated.  On Entresto Coreg. 4. Anemia follow CBC check anemia panel with next blood draw. 5. History of recently diagnosed breast cancer will need further work-up.   DVT prophylaxis: Xarelto. Code Status: Full code. Family Communication: Patient's Fiancee at the bedside. Disposition Plan: Home. Consults called: Neurologist. Admission status: Observation.   Rise Patience MD [facility] Pager [phone].  If 7PM-7AM, please contact  night-coverage [email or web address] Password [email or web address]  03/28/2020, 2:58 AM

## 2020-03-28 NOTE — Evaluation (Signed)
Speech Language Pathology Evaluation Patient Details Name: Courtney Coleman MRN: [id number] DOB: 1965-11-29 Today's Date: 03/28/2020 Time:  -     Problem List:  Patient Active Problem List   Diagnosis Date Noted  . Right foot drop 03/28/2020  . CHF (congestive heart failure) ([place]) 03/28/2020  . Anemia 03/28/2020  . PAF (paroxysmal atrial fibrillation) ([place]) 03/28/2020  . Cerebral embolism with cerebral infarction 03/28/2020   Past Medical History:  Past Medical History:  Diagnosis Date  . Breast cancer ([place])   . CHF (congestive heart failure) ([place])   . Dysrhythmia   . Hypertension    Past Surgical History:  Past Surgical History:  Procedure Laterality Date  . BREAST BIOPSY     HPI:  Courtney Coleman is a 54 y.o. female from the [place], right handed PMHx arrhythmia (xeralto), HTN, CHF and breast cancer (biopsy proven on arimidex) with right sided weakness highly suggestive of stroke. MRI shows Multifocal acute ischemia within the dorsomedial left thalamus and left cerebral peduncle. There is also a small focus in the splenium of the corpus callosum and the left basal ganglia.   Assessment / Plan / Recommendation Clinical Impression  Pt demonstraes aphasia with expressive and receptive language impariment. Pt is able to comprehend single words and respond to basic biographcial questions and linguistic commands, but has poor accuracy with 2-3 step and complex commands. She has frequent paraphasias during naming and conversation level speech, occasionally with awareness and attempts to self correct. Her repetition is very good and she reponds well to phonemic cueing. She is able to read fluently with minimal miscues. She does appear to have right visual field impairment but will attend to right with auditory stimuli and can use finger to track pictures and words effectively. Pt will respond well to therapeutic intervention. Recommend CIR at d/c though if not an option, recommend neuro OP SLP.      SLP Assessment  SLP Recommendation/Assessment: Patient needs continued Speech Lanaguage Pathology Services SLP Visit Diagnosis: Aphasia (R47.01)    Follow Up Recommendations       Frequency and Duration min 2x/week  2 weeks      SLP Evaluation Cognition  Overall Cognitive Status: Within Functional Limits for tasks assessed Arousal/Alertness: Awake/alert Orientation Level: Oriented to place;Oriented to situation;Oriented to person Attention: Sustained;Selective Sustained Attention: Appears intact Selective Attention: Appears intact Memory:  (NT) Awareness: Impaired Awareness Impairment: Emergent impairment       Comprehension  Auditory Comprehension Overall Auditory Comprehension: Impaired Yes/No Questions: Impaired Basic Biographical Questions: 76-100% accurate Complex Questions: 50-74% accurate Commands: Impaired One Step Basic Commands: 50-74% accurate Two Step Basic Commands: 0-24% accurate Multistep Basic Commands: 0-24% accurate Conversation: Simple Interfering Components: Visual impairments Reading Comprehension Reading Status: Impaired Word level: Within functional limits Sentence Level: Impaired    Expression Verbal Expression Overall Verbal Expression: Impaired Initiation: No impairment Automatic Speech: Name;Social Response Level of Generative/Spontaneous Verbalization: Phrase;Word Repetition: No impairment Naming: Impairment Responsive: Not tested Confrontation: Impaired Convergent: Not tested Divergent: Not tested Verbal Errors: Phonemic paraphasias;Semantic paraphasias;Aware of errors;Not aware of errors Pragmatics: No impairment Written Expression Dominant Hand: Right Written Expression: Exceptions to [facility] Self Formulation Ability: Word (name with left hand)   Oral / Motor  Oral Motor/Sensory Function Overall Oral Motor/Sensory Function: Mild impairment Facial ROM: Reduced right Facial Symmetry: Abnormal symmetry right Facial Strength:  Reduced right Facial Sensation: Reduced right Lingual ROM: Within Functional Limits Lingual Symmetry: Within Functional Limits Lingual Strength: Within Functional Limits Velum: Within Functional Limits Mandible: Within Functional Limits  Motor Speech Overall Motor Speech: Appears within functional limits for tasks assessed Respiration: Within functional limits Phonation: Normal Resonance: Within functional limits Articulation: Within functional limitis   GO                   Herbie [place], MA [place] Pager [phone] Office [phone]  Lynann Beaver 03/28/2020, 2:55 PM

## 2020-03-28 NOTE — ED Notes (Signed)
Lunch Tray Ordered @ 1009. 

## 2020-03-28 NOTE — ED Provider Notes (Signed)
[facility] EMERGENCY DEPARTMENT Provider Note   CSN: [id number] Arrival date & time: 03/27/20  1806     History Chief Complaint  Patient presents with  . Extremity Weakness    Courtney Coleman is a 54 y.o. female.  54 year old female who presents with her fianc who helps provide the history.  Apparently patient is recently diagnosed with breast cancer without metastasis that they know of.  This was apparently done at [facility] however I don't see anything at [facility] to verify in it.  Has not started any treatment apparently.  Patient states that she woke up this morning with difficulty moving her right foot.  She states that she was dragging it.  Her fianc states that has been around stroke patient patient's and it appeared that she may have had a stroke and her right leg was not working appropriately.  Seems to gotten better now.  She no other associated symptoms.        Past Medical History:  Diagnosis Date  . Breast cancer ([place])   . Hypertension     There are no problems to display for this patient.   History reviewed. No pertinent surgical history.   OB History   No obstetric history on file.     No family history on file.  Social History   Tobacco Use  . Smoking status: Not on file  Substance Use Topics  . Alcohol use: Not Currently  . Drug use: Not Currently    Home Medications Prior to Admission medications   Not on File    Allergies    Patient has no known allergies.  Review of Systems   Review of Systems  All other systems reviewed and are negative.   Physical Exam Updated Vital Signs BP (!) 145/78 (BP Location: Right Arm)   Pulse 63   Temp 98.4 F (36.9 C) (Oral)   Resp 20   Ht 5\' 6"  (1.676 m)   Wt 83.9 kg   SpO2 99%   BMI 29.86 kg/m   Physical Exam Vitals and nursing note reviewed.  Constitutional:      Appearance: She is well-developed.  HENT:     Head: Normocephalic and atraumatic.     Mouth/Throat:      Mouth: Mucous membranes are moist.     Pharynx: Oropharynx is clear.  Eyes:     Pupils: Pupils are equal, round, and reactive to light.  Cardiovascular:     Rate and Rhythm: Normal rate and regular rhythm.  Pulmonary:     Effort: No respiratory distress.     Breath sounds: No stridor.  Abdominal:     General: Abdomen is flat. There is no distension.  Musculoskeletal:        General: No swelling, tenderness or deformity.     Cervical back: Normal range of motion.  Skin:    General: Skin is warm and dry.  Neurological:     General: No focal deficit present.     Mental Status: She is alert and oriented to person, place, and time.     Cranial Nerves: No cranial nerve deficit.     Sensory: No sensory deficit.     Motor: No weakness.     Gait: Gait (slow, slightly shuffling but no obvious abnormalities) normal.     ED Results / Procedures / Treatments   Labs (all labs ordered are listed, but only abnormal results are displayed) Labs Reviewed  CBC - Abnormal; Notable for the following components:  Result Value   Hemoglobin 11.6 (*)    MCH 25.4 (*)    RDW 19.5 (*)    All other components within normal limits  COMPREHENSIVE METABOLIC PANEL - Abnormal; Notable for the following components:   Potassium 3.3 (*)    Glucose, Bld 175 (*)    Albumin 3.1 (*)    All other components within normal limits  I-STAT CHEM 8, ED - Abnormal; Notable for the following components:   Potassium 3.2 (*)    Glucose, Bld 171 (*)    All other components within normal limits  PROTIME-INR  APTT  DIFFERENTIAL  CBG MONITORING, ED  I-STAT BETA HCG BLOOD, ED ([facility], [facility], [facility] ONLY)    EKG None  Radiology CT HEAD WO CONTRAST  Result Date: 03/27/2020 CLINICAL DATA:  Cranial neuropathy, altered mental status, vomiting, temporal headache EXAM: CT HEAD WITHOUT CONTRAST TECHNIQUE: Contiguous axial images were obtained from the base of the skull through the vertex without intravenous contrast. COMPARISON:   None. FINDINGS: Brain: Normal anatomic configuration. There is a remote lacunar infarct versus a neuro epithelial cyst within the left caudothalamic groove. Mild to moderate periventricular white matter changes are present possibly reflecting the sequela of small vessel ischemia. No evidence of acute intracranial hemorrhage or infarct. No abnormal mass effect or midline shift. No abnormal intra or extra-axial mass lesion or fluid collection. Ventricular size is normal. The cerebellum is unremarkable. Vascular: No asymmetric hyperdense vasculature at the skull base. Skull: Intact Sinuses/Orbits: The orbits are unremarkable. Paranasal sinuses are clear. Other: Mastoid air cells and middle ear cavities are clear. IMPRESSION: Chronic microangiopathic changes, slightly advanced than would be typically expected for a patient of this age. Correlation for neuro degenerative risk factors may be helpful for further management. No evidence of acute intracranial hemorrhage or infarct. Electronically Signed   By: Fidela Salisbury MD   On: 03/27/2020 19:59    Procedures Procedures (including critical care time)  Medications Ordered in ED Medications  sodium chloride flush (NS) 0.9 % injection 3 mL (has no administration in time range)    ED Course  I have reviewed the triage vital signs and the nursing notes.  Pertinent labs & imaging results that were available during my care of the patient were reviewed by me and considered in my medical decision making (see chart for details).    MDM Rules/Calculators/A&P                          Her CT of her head showed possible neuro degenerative changes but no obvious stroke.  I discussed with neurology as at this time he could be a TIA and they recommended observation and TIA work-up for the same.  Discussed with hospitalist who will admit.   Final Clinical Impression(s) / ED Diagnoses Final diagnoses:  Transient right leg weakness  TIA (transient ischemic attack)      Rx / DC Orders ED Discharge Orders         Ordered    Ambulatory referral to Oncology        03/28/20 0130           Batsheva Stevick, Corene Cornea, MD 03/28/20 [phone]

## 2020-03-28 NOTE — Consult Note (Signed)
Neurology consult H&P  CC: right sided numbness and weakness  History is obtained from: patient and  fianc   HPI: Courtney Coleman is a 55 y.o. female  PMHx arrhythmia (xeralto), HTN, CHF and breast cancer (biopsy proven on arimidex) with right sided weakness. On 03/26/2020 she went to bed ~0300 with a very bad headache and she woke the following morning with the same headache and numbness of her right hand/upper extremity and her right leg. She did not feel well had nausea and vomiting, and stayed in bed all day requiring assistance from her fianc to use the bathroom. She measured her blood pressure and noted it was much higher than usual and they decided to seek medical care at [facility] ED.  Denies having similar symptoms in the past. No fever/chills/myalgias.  LKW: 03/26/2020 she went to bed ~0300 tpa given?: No, outside window IR Thrombectomy? No, Modified Rankin Scale: 0-Completely asymptomatic and back to baseline post- stroke NIHSS: 2 face and right leg  ROS: A complete ROS was performed and is negative except as noted in the HPI.   Past Medical History:  Diagnosis Date  . Breast cancer ([place])   . CHF (congestive heart failure) ([place])   . Dysrhythmia   . Hypertension    Family History  Problem Relation Age of Onset  . Cancer Neg Hx    Social History:  reports that she has never smoked. She has never used smokeless tobacco. She reports previous alcohol use. She reports previous drug use.  Prior to Admission medications   Medication Sig Start Date End Date Taking? Authorizing Provider  amiodarone (PACERONE) 200 MG tablet Take 200 mg by mouth daily.   Yes [provider]  anastrozole (ARIMIDEX) 1 MG tablet Take 1 mg by mouth daily.   Yes [provider]  carvedilol (COREG) 25 MG tablet Take 25 mg by mouth 2 (two) times daily with a meal.   Yes [provider]  rivaroxaban (XARELTO) 20 MG TABS tablet Take 20 mg by mouth daily with supper.   Yes [provider]  sacubitril-valsartan (ENTRESTO) 24-26 MG Take 1 tablet by mouth 2 (two) times daily.   Yes [provider]   Exam: Current vital signs: BP (!) 187/87   Pulse (!) 51   Temp 98.4 F (36.9 C) (Oral)   Resp 20   Ht 5\' 6"  (1.676 m)   Wt 83.9 kg   SpO2 97%   BMI 29.86 kg/m   Physical Exam  Constitutional: Appears well-developed and well-nourished.  Psych: Affect appropriate to situation Eyes: No scleral injection HENT: No OP obstrucion Head: Normocephalic.  Cardiovascular: Normal rate and regular rhythm.  Respiratory: Effort normal and breath sounds normal to anterior ascultation GI: Soft.  No distension. There is no tenderness.  Skin: WDI  Neuro: Mental Status: Patient is awake, alert, oriented to person, place, month, year, and situation. Patient is able to give a clear and coherent history. No signs of aphasia or neglect Cranial Nerves: II: Visual Fields are full. Pupils are equal, round, and reactive to light.  III,IV, VI: EOMI without ptosis or diploplia.  V: Facial sensation is to temperature VII: Facial movement is mild asymmetric on right.  VIII: hearing is intact to voice X: Uvula elevates symmetrically XI: Shoulder shrug is symmetric. XII: tongue is midline without atrophy or fasciculations.  Motor: Tone is normal. Bulk is normal. ~4+/5 right arm, 4/5 right leg strength. Sensory: Sensation is symmetric to light touch and temperature in the arms  and legs.  Deep Tendon Reflexes: 2+ and symmetric in the biceps and patellae.  Plantars: Toes are downgoing bilaterally.  Cerebellar: FNF and HKS are intact bilaterally.   I have reviewed the images obtained: NCT head showed did not show acute ischemic changes CTA head and neck pending  Assessment: Courtney Coleman is a 53 y.o. female right handed PMHx arrhythmia (xeralto), HTN, CHF and breast cancer (biopsy proven on arimidex) with right sided weakness highly suggestive of stroke. She denies  missing any doses of rivaroxaban and is compliant with all her medications. Given her age and cardiac history she will need further workup. Though paraneoplastic coagulopathy is by far less common in breast cancer cannot completely exclude.   Impression: Acute ischemic stroke Arrhythmia (xeralto) CHF HTN Breast cancer ([place]) on Arimidex - she mentioned that the nipple of her left nipple has darkened recently which may warrant review.    Plan: - MRI brain without contrast - Pending. - Vascular imaging pending head and neck. - Recommend TTE. - Recommend labs: HbA1c, lipid panel, TSH. - Recommend Statin if LDL > 70 - Continue rivaroxaban. - Permissive hypertension first 24 h < 220/110 for now. - Telemetry monitoring per protocol. - Recommend Stroke education. - Recommend PT/OT/SLP consult. - Further workup will follow.  Electronically signed by: Dr. Lynnae Sandhoff Pager: [phone] 03/28/2020, 3:11 AM

## 2020-03-28 NOTE — ED Notes (Signed)
eccho at bedside

## 2020-03-28 NOTE — Evaluation (Signed)
Physical Therapy Evaluation Patient Details Name: Courtney Coleman MRN: [id number] DOB: 04-Dec-1965 Today's Date: 03/28/2020   History of Present Illness  54 y.o. female  PMHx arrhythmia (xeralto), HTN, CHF and breast cancer (biopsy proven on arimidex) with right sided weakness and numbness. MRI- Multifocal acute ischemia within the dorsomedial left thalamus and left cerebral peduncle; also small foci in the splenium of the corpus callosum and the left basal ganglia; occlusion Lt posterior cerebral artery.   Clinical Impression   Pt admitted with above diagnosis. Patient was independent, living in a 2 story home with only 1/2 bath on the main level. She currently requires up to moderate assist to safely ambulate due to RLE weakness, numbness, and impaired balance/gait. She progressed to min assist with RW. She has a supportive fiance.  Pt currently with functional limitations due to the deficits listed below (see PT Problem List). Pt will benefit from skilled PT to increase their independence and safety with mobility to allow discharge to the venue listed below.       Follow Up Recommendations Outpatient PT    Equipment Recommendations  Rolling walker with 5" wheels    Recommendations for Other Services       Precautions / Restrictions Precautions Precautions: Fall Restrictions Weight Bearing Restrictions: No      Mobility  Bed Mobility Overal bed mobility: Needs Assistance Bed Mobility: Supine to Sit     Supine to sit: Supervision     General bed mobility comments: incr time and effort (on ED stretcher)    Transfers Overall transfer level: Needs assistance Equipment used: None Transfers: Sit to/from Stand Sit to Stand: Min assist;From elevated surface         General transfer comment: assist to rise and steady  Ambulation/Gait Ambulation/Gait assistance: Mod assist Gait Distance (Feet): 80 Feet Assistive device: Rolling walker (2 wheeled);None Gait  Pattern/deviations: Step-through pattern;Decreased dorsiflexion - right;Steppage;Staggering right;Drifts right/left Gait velocity: decr   General Gait Details: without UE support, pt with staggering LOB to her right requiring mod assist to recover; initiated education on use of RW (increased difficulty with 180 degree turn to return to her room (stepping outside RW)  Science writer    Modified Rankin (Stroke Patients Only) Modified Rankin (Stroke Patients Only) Pre-Morbid Rankin Score: No symptoms Modified Rankin: Moderately severe disability     Balance Overall balance assessment: Needs assistance Sitting-balance support: Bilateral upper extremity supported;Feet supported Sitting balance-Leahy Scale: Fair     Standing balance support: Bilateral upper extremity supported;During functional activity Standing balance-Leahy Scale: Fair Standing balance comment: pt more stable with RW and BUE support. Loses balance quickly with dynamic tasks or challenge out of BOS                             Pertinent Vitals/Pain Pain Assessment: No/denies pain    Home Living Family/patient expects to be discharged to:: Private residence Living Arrangements: Other relatives;Spouse/significant other (sister, niece, nephew) Available Help at Discharge: Family;Available 24 hours/day Type of Home: House Home Access: Stairs to enter   [employer] of Steps: 1 Home Layout: Two level;Bed/bath upstairs;1/2 bath on main level;Able to live on main level with bedroom/bathroom Home Equipment: Shower seat - built in;Grab bars - tub/shower      Prior Function Level of Independence: Independent         Comments: not working      Engineer, manufacturing  Dominance   Dominant Hand: Right    Extremity/Trunk Assessment   Upper Extremity Assessment Upper Extremity Assessment: Generalized weakness;RUE deficits/detail RUE Deficits / Details: mild decrease in  proprioception; difficulty pulling up sock and using L hand when pt is R hand dominant RUE Coordination: decreased fine motor    Lower Extremity Assessment Lower Extremity Assessment: Defer to PT evaluation RLE Deficits / Details: ankle DF 4/5 RLE Sensation: decreased light touch (75% compared to LLE) RLE Coordination: decreased fine motor (rt shoe slipping off her foot while walking (slip-on))    Cervical / Trunk Assessment Cervical / Trunk Assessment: Other exceptions Cervical / Trunk Exceptions: obese  Communication   Communication: Expressive difficulties (word finding difficulties)  Cognition Arousal/Alertness: Awake/alert Behavior During Therapy: Flat affect Overall Cognitive Status: Impaired/Different from baseline Area of Impairment: Problem solving;Safety/judgement                         Safety/Judgement: Decreased awareness of deficits   Problem Solving: Slow processing;Decreased initiation;Requires verbal cues General Comments: able to fully orient self. Noted decereased processing/problem solving speed requiring supplemental cues to complete basic tasks. Decreased awareness of current deficits unless cued      General Comments General comments (skin integrity, edema, etc.): supine HR 58 BP 156/80; sit after walk HR 65 BP 167/86    Exercises     Assessment/Plan    PT Assessment Patient needs continued PT services  PT Problem List Decreased strength;Decreased balance;Decreased mobility;Decreased cognition;Decreased knowledge of use of DME;Impaired sensation;Obesity       PT Treatment Interventions DME instruction;Gait training;Stair training;Functional mobility training;Therapeutic activities;Therapeutic exercise;Balance training;Neuromuscular re-education;Cognitive remediation;Patient/family education    PT Goals (Current goals can be found in the Care Plan section)  Acute Rehab PT Goals Patient Stated Goal: return home PT Goal Formulation: With  patient Time For Goal Achievement: 04/11/20 Potential to Achieve Goals: Good    Frequency Min 4X/week   Barriers to discharge        Co-evaluation PT/OT/SLP Co-Evaluation/Treatment: Yes Reason for Co-Treatment: For patient/therapist safety;To address functional/ADL transfers PT goals addressed during session: Mobility/safety with mobility;Balance;Proper use of DME OT goals addressed during session: ADL's and self-care;Proper use of Adaptive equipment and DME;Strengthening/ROM       AM-PAC PT "6 Clicks" Mobility  Outcome Measure Help needed turning from your back to your side while in a flat bed without using bedrails?: None Help needed moving from lying on your back to sitting on the side of a flat bed without using bedrails?: A Little Help needed moving to and from a bed to a chair (including a wheelchair)?: A Little Help needed standing up from a chair using your arms (e.g., wheelchair or bedside chair)?: A Little Help needed to walk in hospital room?: A Little Help needed climbing 3-5 steps with a railing? : A Little 6 Click Score: 19    End of Session Equipment Utilized During Treatment: Gait belt Activity Tolerance: Patient tolerated treatment well Patient left: in bed;with call bell/phone within reach;with family/visitor present (in ED)   PT Visit Diagnosis: Hemiplegia and hemiparesis Hemiplegia - Right/Left: Right Hemiplegia - dominant/non-dominant: Dominant Hemiplegia - caused by: Cerebral infarction    Time: 4944-9675 PT Time Calculation (min) (ACUTE ONLY): 34 min   Charges:   PT Evaluation $PT Eval Moderate Complexity: 1 Mod           Arby Barrette, PT Pager [phone]   Courtney Coleman 03/28/2020, 10:10 AM

## 2020-03-28 NOTE — ED Notes (Signed)
Breakfast provided.

## 2020-03-28 NOTE — Progress Notes (Signed)
ANTICOAGULATION CONSULT NOTE - Initial Consult  Pharmacy Consult for Apixaban Indication: atrial fibrillation   No Known Allergies  Patient Measurements: Height: 5\' 6"  (167.6 cm) Weight: 83.9 kg (185 lb) IBW/kg (Calculated) : 59.3    Vital Signs: BP: 146/83 (10/30 1130) Pulse Rate: 55 (10/30 1130)  Labs: Recent Labs    03/27/20 1831 03/27/20 1855 03/28/20 0751  HGB 11.6* 13.3  --   HCT 38.5 39.0  --   PLT 328  --   --   APTT 29  --   --   LABPROT 14.3  --   --   INR 1.2  --   --   CREATININE 0.88 0.80 0.82    Estimated Creatinine Clearance: 86.6 mL/min (by C-G formula based on SCr of 0.82 mg/dL).   Medical History: Past Medical History:  Diagnosis Date  . Breast cancer ([place])   . CHF (congestive heart failure) ([place])   . Dysrhythmia   . Hypertension     Assessment: 54 yo W with new CVA while compliant on rivaroxaban. Pharmacy is consulted for apixaban. H/H, plt wnl.   Goal of Therapy:  Monitor platelets by anticoagulation protocol: Yes   Plan:  Apixaban 5mg  BID  Monitor for signs/symptoms of bleeding    Benetta Spar, PharmD, BCPS, BCCP Clinical Pharmacist  Please check AMION for all [place] phone numbers After 10:00 PM, call [place] [phone]

## 2020-03-28 NOTE — Progress Notes (Addendum)
STROKE TEAM PROGRESS NOTE   INTERVAL HISTORY Her husband is at the bedside.  Pt lying in bed, lethargic, but AAO x 3. Still has right facial droop, right hemiparesis. Pt stated compliance with Xarelto at home. Pt just moved from [facility] to [place] last week, has no oncologist yet. She was following with oncologist in [facility] for breast cancer and was told need more testing before treatment. Currently she is on anastrozole.   OBJECTIVE Vitals:   03/28/20 0930 03/28/20 1000 03/28/20 1015 03/28/20 1030  BP: (!) 163/89 138/80 140/67 (!) 165/95  Pulse: 61 (!) 58 (!) 56 (!) 59  Resp: 17 19 (!) 21 (!) 24  Temp:      TempSrc:      SpO2: 100% 98% 99% 99%  Weight:      Height:        CBC:  Recent Labs  Lab 03/27/20 1831 03/27/20 1855  WBC 8.3  --   NEUTROABS 4.6  --   HGB 11.6* 13.3  HCT 38.5 39.0  MCV 84.2  --   PLT 328  --     Basic Metabolic Panel:  Recent Labs  Lab 03/27/20 1831 03/27/20 1831 03/27/20 1855 03/28/20 0751  NA 139   < > 142 139  K 3.3*   < > 3.2* 3.3*  CL 105   < > 104 105  CO2 25  --   --  24  GLUCOSE 175*   < > 171* 121*  BUN 11   < > 12 16  CREATININE 0.88   < > 0.80 0.82  CALCIUM 9.1  --   --  9.3  MG  --   --   --  1.8   < > = values in this interval not displayed.    Lipid Panel:     Component Value Date/Time   CHOL 207 (H) 03/28/2020 0751   TRIG 73 03/28/2020 0751   HDL 63 03/28/2020 0751   CHOLHDL 3.3 03/28/2020 0751   VLDL 15 03/28/2020 0751   LDLCALC 129 (H) 03/28/2020 0751   HgbA1c:  Lab Results  Component Value Date   HGBA1C 7.2 (H) 03/28/2020   Urine Drug Screen: No results found for: LABOPIA, COCAINSCRNUR, LABBENZ, AMPHETMU, THCU, LABBARB  Alcohol Level No results found for: ETH  IMAGING  CT HEAD WO CONTRAST 03/27/2020 IMPRESSION:  Chronic microangiopathic changes, slightly advanced than would be typically expected for a patient of this age. Correlation for neuro degenerative risk factors may be helpful for further management. No  evidence of acute intracranial hemorrhage or infarct.   MR BRAIN WO CONTRAST MR ANGIO HEAD WO CONTRAST MR ANGIO NECK WO CONTRAST 03/28/2020 IMPRESSION:  1. Multifocal acute ischemia within the dorsomedial left thalamus and left cerebral peduncle. There are also small foci in the splenium of the corpus callosum and the left basal ganglia.  2. 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  3. Otherwise normal MRA of the head and neck. E  Transthoracic Echocardiogram  1. Left ventricular ejection fraction, by estimation, is 60 to 65%. The  left ventricle has normal function. The left ventricle has no regional  wall motion abnormalities. Left ventricular diastolic parameters are  consistent with Grade I diastolic  dysfunction (impaired relaxation).  2. Right ventricular systolic function is normal. The right ventricular  size is normal. There is normal pulmonary artery systolic pressure.  3. Left atrial size was severely dilated.  4. The mitral valve is normal in structure. Trivial  mitral valve  regurgitation. No evidence of mitral stenosis.  5. The aortic valve is tricuspid. Aortic valve regurgitation is not  visualized. Mild to moderate aortic valve sclerosis/calcification is  present, without any evidence of aortic stenosis.  6. The inferior vena cava is normal in size with greater than 50%  respiratory variability, suggesting right atrial pressure of 3 mmHg.  ECG - SR rate 69 BPM. (See cardiology reading for complete details)  PHYSICAL EXAM  Temp:  [98.4 F (36.9 C)-98.9 F (37.2 C)] 98.4 F (36.9 C) (10/29 2349) Pulse Rate:  [51-69] 55 (10/30 1130) Resp:  [14-24] 14 (10/30 1130) BP: (138-187)/(67-100) 146/83 (10/30 1130) SpO2:  [96 %-100 %] 99 % (10/30 1130) Weight:  [83.9 kg] 83.9 kg (10/29 1822)  General - Well nourished, well developed, lethargic with depressed mood.  Ophthalmologic - fundi not visualized due to  noncooperation.  Cardiovascular - Regular rhythm and rate, not in afib.  Mental Status -  Level of arousal and orientation to time, place, and person were intact. Language including expression, naming, repetition, comprehension was assessed and found intact. Mild psychomotor slowing  Cranial Nerves II - XII - II - Visual field intact OU. III, IV, VI - Extraocular movements intact. V - right facial decreased sensation. VII - right facial droop, mild VIII - Hearing & vestibular intact bilaterally. X - Palate elevates symmetrically. XI - Chin turning & shoulder shrug intact bilaterally. XII - Tongue protrusion intact.  Motor Strength - The patient's strength was normal in left UE and LE, however, RUE 3/5 with drift and decreased right hand dexterity, RLE 3/5 proximal and knee flexion, 4/5 ankle DF/PF. Bulk was normal and fasciculations were absent.   Motor Tone - Muscle tone was assessed at the neck and appendages and was normal.  Reflexes - The patient's reflexes were symmetrical in all extremities and she had no pathological reflexes.  Sensory - Light touch, temperature/pinprick were assessed and were decreased on the right.    Coordination - The patient had normal movements in the hands with no ataxia or dysmetria.  Tremor was absent.  Gait and Station - deferred.   ASSESSMENT/PLAN [name] is a 54 y.o. female with history of atrial fibrillation (xeralto), HTN, CHF and recently diagnosed breast cancer (biopsy proven on arimidex) who presents with right sided weakness and mild confusion. She did not receive IV t-PA due to late presentation (>4.5 hours from time of onset).   Stroke: Acute ischemia within the dorsomedial left thalamus and left cerebral peduncle. There are also small foci in the splenium of the corpus callosum and the left basal ganglia - embolic - likely from afib  CT head - No evidence of acute intracranial hemorrhage or infarct.   MRI head - Multifocal  acute ischemia within the dorsomedial left thalamus and left cerebral peduncle. 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 Otherwise normal MRA of the head and neck.   2D Echo - EF 60-65%, LA severely dilated  [name] - negative  LDL - 129  HgbA1c - 7.2  UDS - pending  VTE prophylaxis - Xarelto  Xarelto (rivaroxaban) daily prior to admission, now on Xarelto (rivaroxaban) daily. Given the Xarelto failure, will switch to eliquis  Patient counseled to be compliant with her antithrombotic medications  Ongoing aggressive stroke risk factor management  Therapy recommendations:  pending  Disposition:  Pending  Breast cancer  Recently diagnosed breast cancer (biopsy proven)  Has been following with [facility] oncology   On anastrozole  Not establish oncology care in [place] yet  MRI brain with contrast pending  Hypertension  CHF  Home BP meds: Coreg ; Entresto  Current BP meds: Coreg ; Entresto  Stable . Long-term BP goal normotensive . 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  Orthostatic hypotension  On coreg  10/30 get up to bathroom feeling dizzy  Briefly worsening of right side weakness, resolved after lying down  Will need to check orthostatic vitals with PT/OT tomorrow  Best rest for today  NS @ 50 x 12h  TED hose  Afib s/p cardioversion  Currently NSR  Rate controlled  On Xarelto PTA  Will switch to eliquis given Xarelto failure  Hyperlipidemia  Home Lipid lowering medication: none   LDL 129, goal < 70  Current lipid lowering medication: Lipitor 40 mg daily   Continue statin at discharge  Diabetes  Home diabetic meds: none   Current diabetic meds: none  HgbA1c 7.2, goal < 7.0  CBG monitoring  DM education  Other Stroke Risk Factors  Previous ETOH use  Previous drug use  Other Active  Problems  Code status - Full code  Recently diagnosed breast cancer (biopsy proven on arimidex)   Hypokalemia 3.3 - supplement  Hospital day # 0  Patient had presyncope event today and resolved back to bed. Managed accordingly for orthostasis. Further evaluation of stroke and rule out brain metastasis. I spent  35 minutes in total face-to-face time with the patient, more than 50% of which was spent in counseling and coordination of care, reviewing test results, images and medication, and discussing the diagnosis, treatment plan and potential prognosis. This patient's care requiresreview of multiple databases, neurological assessment, discussion with family, other specialists and medical decision making of high complexity. I had long discussion with husband at bedside, updated pt current condition, treatment plan and potential prognosis, and answered all the questions. He expressed understanding and appreciation. I also discussed with Dr. Roosevelt Locks via secure messaging.  Rosalin Hawking, MD PhD Stroke Neurology 03/28/2020 3:36 PM    To contact Stroke Continuity provider, please refer to [email or web address] After hours, contact General Neurology

## 2020-03-29 DIAGNOSIS — I639 Cerebral infarction, unspecified: Secondary | ICD-10-CM | POA: Diagnosis present

## 2020-03-29 DIAGNOSIS — R209 Unspecified disturbances of skin sensation: Secondary | ICD-10-CM | POA: Diagnosis not present

## 2020-03-29 DIAGNOSIS — Z833 Family history of diabetes mellitus: Secondary | ICD-10-CM | POA: Diagnosis not present

## 2020-03-29 DIAGNOSIS — R208 Other disturbances of skin sensation: Secondary | ICD-10-CM | POA: Diagnosis not present

## 2020-03-29 DIAGNOSIS — Z20822 Contact with and (suspected) exposure to covid-19: Secondary | ICD-10-CM | POA: Diagnosis present

## 2020-03-29 DIAGNOSIS — E876 Hypokalemia: Secondary | ICD-10-CM | POA: Diagnosis present

## 2020-03-29 DIAGNOSIS — R29810 Facial weakness: Secondary | ICD-10-CM | POA: Diagnosis present

## 2020-03-29 DIAGNOSIS — Z79899 Other long term (current) drug therapy: Secondary | ICD-10-CM | POA: Diagnosis not present

## 2020-03-29 DIAGNOSIS — C50919 Malignant neoplasm of unspecified site of unspecified female breast: Secondary | ICD-10-CM | POA: Diagnosis present

## 2020-03-29 DIAGNOSIS — I63432 Cerebral infarction due to embolism of left posterior cerebral artery: Secondary | ICD-10-CM | POA: Diagnosis not present

## 2020-03-29 DIAGNOSIS — Z79811 Long term (current) use of aromatase inhibitors: Secondary | ICD-10-CM | POA: Diagnosis not present

## 2020-03-29 DIAGNOSIS — I6349 Cerebral infarction due to embolism of other cerebral artery: Secondary | ICD-10-CM | POA: Diagnosis not present

## 2020-03-29 DIAGNOSIS — I480 Paroxysmal atrial fibrillation: Secondary | ICD-10-CM | POA: Diagnosis not present

## 2020-03-29 DIAGNOSIS — G459 Transient cerebral ischemic attack, unspecified: Secondary | ICD-10-CM | POA: Diagnosis present

## 2020-03-29 DIAGNOSIS — Z7901 Long term (current) use of anticoagulants: Secondary | ICD-10-CM | POA: Diagnosis not present

## 2020-03-29 DIAGNOSIS — C50912 Malignant neoplasm of unspecified site of left female breast: Secondary | ICD-10-CM | POA: Diagnosis not present

## 2020-03-29 DIAGNOSIS — I69398 Other sequelae of cerebral infarction: Secondary | ICD-10-CM | POA: Diagnosis not present

## 2020-03-29 DIAGNOSIS — D62 Acute posthemorrhagic anemia: Secondary | ICD-10-CM | POA: Diagnosis not present

## 2020-03-29 DIAGNOSIS — I110 Hypertensive heart disease with heart failure: Secondary | ICD-10-CM | POA: Diagnosis present

## 2020-03-29 DIAGNOSIS — G8191 Hemiplegia, unspecified affecting right dominant side: Secondary | ICD-10-CM | POA: Diagnosis present

## 2020-03-29 DIAGNOSIS — D649 Anemia, unspecified: Secondary | ICD-10-CM | POA: Diagnosis present

## 2020-03-29 DIAGNOSIS — R29702 NIHSS score 2: Secondary | ICD-10-CM | POA: Diagnosis present

## 2020-03-29 DIAGNOSIS — E1165 Type 2 diabetes mellitus with hyperglycemia: Secondary | ICD-10-CM | POA: Diagnosis present

## 2020-03-29 DIAGNOSIS — M21371 Foot drop, right foot: Secondary | ICD-10-CM | POA: Diagnosis present

## 2020-03-29 DIAGNOSIS — I10 Essential (primary) hypertension: Secondary | ICD-10-CM | POA: Diagnosis not present

## 2020-03-29 DIAGNOSIS — I951 Orthostatic hypotension: Secondary | ICD-10-CM | POA: Diagnosis not present

## 2020-03-29 DIAGNOSIS — E785 Hyperlipidemia, unspecified: Secondary | ICD-10-CM | POA: Diagnosis present

## 2020-03-29 DIAGNOSIS — I5032 Chronic diastolic (congestive) heart failure: Secondary | ICD-10-CM

## 2020-03-29 HISTORY — DX: Cerebral infarction, unspecified: I63.9

## 2020-03-29 LAB — MAGNESIUM
Magnesium: 1.7 mg/dL (ref 1.7–2.4)
Magnesium: 1.7 mg/dL (ref 1.7–2.4)

## 2020-03-29 LAB — BASIC METABOLIC PANEL
Anion gap: 8 (ref 5–15)
BUN: 14 mg/dL (ref 6–20)
CO2: 24 mmol/L (ref 22–32)
Calcium: 9 mg/dL (ref 8.9–10.3)
Chloride: 108 mmol/L (ref 98–111)
Creatinine, Ser: 0.79 mg/dL (ref 0.44–1.00)
GFR, Estimated: >60 mL/min (ref >60)
Glucose, Bld: 118 mg/dL — ABNORMAL HIGH (ref 70–99)
Potassium: 3.9 mmol/L (ref 3.5–5.1)
Sodium: 140 mmol/L (ref 135–145)

## 2020-03-29 LAB — GLUCOSE, CAPILLARY
Glucose-Capillary: 109 mg/dL — ABNORMAL HIGH (ref 70–99)
Glucose-Capillary: 126 mg/dL — ABNORMAL HIGH (ref 70–99)
Glucose-Capillary: 177 mg/dL — ABNORMAL HIGH (ref 70–99)
Glucose-Capillary: 129 mg/dL — ABNORMAL HIGH (ref 70–99)

## 2020-03-29 MED ORDER — ATORVASTATIN CALCIUM 40 MG PO TABS
40.0000 mg | ORAL_TABLET | Freq: Every day | ORAL | 0 refills | Status: DC
Start: 2020-03-29 — End: 2020-04-21

## 2020-03-29 MED ORDER — APIXABAN 5 MG PO TABS
5.0000 mg | ORAL_TABLET | Freq: Two times a day (BID) | ORAL | 0 refills | Status: DC
Start: 2020-03-29 — End: 2020-04-21

## 2020-03-29 NOTE — Progress Notes (Signed)
Physical Therapy Treatment Patient Details Name: Courtney Coleman MRN: [id number] DOB: [birth year] Today's Date: 03/29/2020    History of Present Illness 54 y.o. female  PMHx arrhythmia (xeralto), HTN, CHF and breast cancer (biopsy proven on arimidex) with right sided weakness and numbness. MRI- Multifocal acute ischemia within the dorsomedial left thalamus and left cerebral peduncle; also small foci in the splenium of the corpus callosum and the left basal ganglia; occlusion Lt posterior cerebral artery. 10/30 pre-syncopal episode with nursing with RLE buckling. +orthostasis 10/31 with PT    PT Comments    Patient with increased rt sided weakness and inattention to rt side compared to 03/28/20. Symptoms especially worse with standing (note orthostatic BP, HR below). Right knee buckling requiring up to mod assist for sit to stand and pivot to chair on her left. Unable to ambulate safely this date (after walking 80 ft with me in ED on 10/30). MD and RN made aware.    03/29/20 0924  Vital Signs  BP Location Left Arm  BP Method Automatic  Patient Position (if appropriate) Orthostatic Vitals  Orthostatic Lying   BP- Lying 163/73  Pulse- Lying 61  Orthostatic Sitting  BP- Sitting 189/76  Pulse- Sitting 60  Orthostatic Standing at 0 minutes  BP- Standing at 0 minutes (!) 183/107  Pulse- Standing at 0 minutes 70  Orthostatic Standing at 3 minutes  BP- Standing at 3 minutes (!) 166/97  Pulse- Standing at 3 minutes 69    Follow Up Recommendations  CIR;Supervision/Assistance - 24 hour     Equipment Recommendations  Other (comment) (TBD as progresses)    Recommendations for Other Services Rehab consult     Precautions / Restrictions Precautions Precautions: Fall Precaution Comments: orthostasis    Mobility  Bed Mobility Overal bed mobility: Needs Assistance Bed Mobility: Supine to Sit     Supine to sit: Min assist     General bed mobility comments: incr time and effort;  assist with RLE stuck in linens; HOB flat and no rail  Transfers Overall transfer level: Needs assistance Equipment used: Rolling walker (2 wheeled) Transfers: Sit to/from [facility] Sit to Stand: Mod assist Stand pivot transfers: Mod assist       General transfer comment: sit to stand from bed x 2, however required multiple attempts prior to success due to poor sequencing; in standing, RLE incr weakness with buckling with pt sitting early during pivot to chair and sitting on the armrest of chair  Ambulation/Gait             General Gait Details: unable to safely ambulate due to Rt knee buckling in standing (including buckling when pivoting to her chair on her left   Stairs             Wheelchair Mobility    Modified Rankin (Stroke Patients Only) Modified Rankin (Stroke Patients Only) Pre-Morbid Rankin Score: No symptoms Modified Rankin: Severe disability     Balance Overall balance assessment: Needs assistance Sitting-balance support: Feet supported;No upper extremity supported Sitting balance-Leahy Scale: Fair     Standing balance support: Bilateral upper extremity supported;During functional activity Standing balance-Leahy Scale: Poor Standing balance comment: required bil UE support; RLE buckling                            Cognition Arousal/Alertness: Awake/alert Behavior During Therapy: Flat affect Overall Cognitive Status: Difficult to assess Area of Impairment: Problem solving;Safety/judgement;Attention;Memory;Following commands;Awareness  Current Attention Level: Focused;Sustained Memory: Decreased short-term memory Following Commands: Follows one step commands inconsistently Safety/Judgement: Decreased awareness of deficits (?more of an inattention) Awareness: Intellectual Problem Solving: Slow processing;Decreased initiation;Requires verbal cues;Difficulty sequencing;Requires tactile  cues General Comments: ?some issues due to decr language (see SLP eval); required multi-modal cues for sequencing with sit to stand with RW and continued to perform incorrectly/unsafely; appeared incr inattention to rt side (especially in standing as BP dropped)      Exercises      General Comments        Pertinent Vitals/Pain      Home Living                      Prior Function            PT Goals (current goals can now be found in the care plan section) Acute Rehab PT Goals Patient Stated Goal: return home Time For Goal Achievement: 04/11/20 Potential to Achieve Goals: Good Progress towards PT goals: Not progressing toward goals - comment (incr weakness on Rt)    Frequency    Min 4X/week      PT Plan Discharge plan needs to be updated;Frequency needs to be updated    Co-evaluation              AM-PAC PT "6 Clicks" Mobility   Outcome Measure  Help needed turning from your back to your side while in a flat bed without using bedrails?: None Help needed moving from lying on your back to sitting on the side of a flat bed without using bedrails?: A Little Help needed moving to and from a bed to a chair (including a wheelchair)?: A Lot Help needed standing up from a chair using your arms (e.g., wheelchair or bedside chair)?: A Lot Help needed to walk in hospital room?: Total Help needed climbing 3-5 steps with a railing? : Total 6 Click Score: 13    End of Session Equipment Utilized During Treatment: Gait belt Activity Tolerance: Treatment limited secondary to medical complications (Comment) (othostasis; dizziness; incr Rt weakness) Patient left: with call bell/phone within reach;with family/visitor present;in chair;with chair alarm set (in ED) Nurse Communication: Mobility status;Other (comment) (+orthostasis; Rt knee buckles) PT Visit Diagnosis: Hemiplegia and hemiparesis;Other abnormalities of gait and mobility (R26.89) Hemiplegia - Right/Left:  Right Hemiplegia - dominant/non-dominant: Dominant Hemiplegia - caused by: Cerebral infarction     Time: 0626-9485 PT Time Calculation (min) (ACUTE ONLY): 39 min  Charges:  $Therapeutic Activity: 38-52 mins                      Arby Barrette, PT Pager [phone]    Rexanne Mano 03/29/2020, 10:20 AM

## 2020-03-29 NOTE — Discharge Instructions (Signed)

## 2020-03-29 NOTE — Discharge Summary (Addendum)
Physician Discharge Summary  Patient ID: Courtney Coleman MRN: [id number] DOB/AGE: Aug 03, 1965 54 y.o.  Admit date: 03/27/2020 Discharge date: 03/29/2020  Admission Diagnoses:  Discharge Diagnoses:  Active Problems:   Right foot drop   CHF (congestive heart failure) (HCC)   Anemia   PAF (paroxysmal atrial fibrillation) (HCC)   Cerebral embolism with cerebral infarction   Discharged Condition: good  Hospital Course:  Courtney Hannais a 54 y.o.femalewithhistory of recently diagnosed breast cancer atrial fibrillation status post cardioversion, CHF unknown EF, hypertension presents to the ER because of right foot drop. Patient states she had recently moved from [place] to [place] in August and had a breast mass which was biopsied in [place] hospital and was found to be malignant. During that time patient also was found to have CHF and A. fib. Patient was cardioverted and placed on amiodarone Coreg and Xarelto. Patient moved to [facility] for further work-up when patient found that over the last 24 hours patient found it difficult to walk because patient had right foot drop. Denies any weakness of the lower extremities or difficulty speaking swallowing or any visual symptoms. Patient appears mildly confused as per the patient's fancy was at the bedside.  ED Course:In the ER patient initially had some weakness in the right foot which resolved. CT head was unremarkable. EKG shows normal sinus rhythm. On-call neurology was consulted and patient admitted for further work-up. Labs show hemoglobin of 11.6 and potassium of 3.3 blood glucose of 175. Covid test is negative.  10/30.  MRI of the brain showed multiple ischemic infarcts in the left-sided brain.  MR angiogram showed occluded left PCA branches at the P1 and P2. Echocardiogram showed normal ejection fraction, no source of clots. Patient has been seen by neurology, changed to eliquis.   #1.  Left brain embolic stroke.  Most  likely secondary to atrial fibrillation. Patient has been seen by neurology.    Repeated brain MRI with contrast confirmed stroke, no metastasis.  Patient also has been seen by physical therapy and Occupational Therapy, recommended outpatient treatment.  Will set up. I have discussed with nurse and social worker, will try to set up a primary care physician for patient to follow-up.  I also discussed with Dr. Erlinda Hong, patient will follow up with him in 3 weeks.  #2.  Paroxysmal atrial fibrillation. Patient still on amiodarone, Coreg and Eliquis  3.  Chronic diastolic congestive heart failure. Echocardiogram showed ejection fraction normal.  Continued on beta-blocker as well Entresto.  4.  Breast cancer. We will need to follow-up with oncology as outpatient.  No metastasis.  5.  Hypokalemia. Supplemented.  6.  Near syncope. Patient had episode of dizziness and increased right-sided weakness yesterday.  Was given fluids overnight.  Condition has resolved today.  Blood pressure running high now.  7.  Essential hypertension. Resume all home medicines.  Addendum: She is seen by physical therapy again, recommended inpatient rehab.  Will cancel discharge, looking for inpatient rehab.  Continue current treatment.  Consults: neurology  Significant Diagnostic Studies:  MRI HEAD WITH CONTRAST  TECHNIQUE: Multiplanar, multiecho pulse sequences of the brain and surrounding structures were obtained with intravenous contrast.  CONTRAST:  2mL GADAVIST GADOBUTROL 1 MMOL/ML IV SOLN  COMPARISON:  03/28/2020 CT and MRI head.  FINDINGS: Brain: Redemonstration of multifocal acute infarcts, better demonstrated on prior MRI. Chronic lacunar insults involving the bilateral basal ganglia and right thalamus. Remote bilateral centrum semiovale insults.  No acute intracranial hemorrhage. No midline shift, ventriculomegaly or extra-axial fluid collection.  No mass lesion. No  abnormal enhancement.  Vascular: Please see recent MRA head.  Skull and upper cervical spine: Normal marrow signal.  Sinuses/Orbits: Normal orbits. Pneumatized paranasal sinuses and mastoid air cells.  Other: None.  IMPRESSION: Redemonstration of multifocal acute and chronic insults, better demonstrated on prior MRI.  No abnormal enhancement or mass lesion.   Electronically Signed   By: Primitivo Gauze M.D.   On: 03/28/2020 19:29  MRI HEAD WITHOUT CONTRAST  MRA HEAD WITHOUT CONTRAST  MRA NECK WITHOUT CONTRAST  TECHNIQUE: Multiplanar, multiecho pulse sequences of the brain and surrounding structures were obtained without intravenous contrast. Angiographic images of the Circle of Willis were obtained using MRA technique without intravenous contrast. Angiographic images of the neck were obtained using MRA technique without intravenous contrast. Carotid stenosis measurements (when applicable) are obtained utilizing NASCET criteria, using the distal internal carotid diameter as the denominator.  COMPARISON:  None.  FINDINGS: MRI HEAD FINDINGS  Brain: Multifocal acute ischemia within the dorsomedial left thalamus and left cerebral peduncle. There is also a small focus in the splenium of the corpus callosum and the left basal ganglia. 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 Multifocal hyperintense T2-weighted signal within the white matter. Normal volume of CSF spaces. No chronic microhemorrhage. Normal midline structures.  Vascular: Normal flow voids.  Skull and upper cervical spine: Normal marrow signal.  Sinuses/Orbits: Negative.  Other: None.  MRA HEAD FINDINGS  POSTERIOR CIRCULATION:  --Vertebral arteries: Normal  --Inferior cerebellar arteries: Normal.  --Basilar artery: Normal.  --Superior cerebellar arteries: Normal.  --Posterior cerebral arteries: The left posterior cerebral artery is occluded at the P1 2  junction. The right is normal.  ANTERIOR CIRCULATION:  --Intracranial internal carotid arteries: Normal.  --Anterior cerebral arteries (ACA): Normal.  --Middle cerebral arteries (MCA): Normal.  ANATOMIC VARIANTS: Fetal origin of the right PCA.  MRA NECK FINDINGS  Carotid and vertebral arteries are normal.  IMPRESSION: 1. Multifocal acute ischemia within the dorsomedial left thalamus and left cerebral peduncle. There are also small foci in the splenium of the corpus callosum and the left basal ganglia. 2. 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 3. Otherwise normal MRA of the head and neck.   Electronically Signed   By: Ulyses Jarred M.D.   On: 03/28/2020 05:05  Echo:  1. Left ventricular ejection fraction, by estimation, is 60 to 65%. The left ventricle has normal function. The left ventricle has no regional wall motion abnormalities. Left ventricular diastolic parameters are consistent with Grade I diastolic dysfunction (impaired relaxation). 2. Right ventricular systolic function is normal. The right ventricular size is normal. There is normal pulmonary artery systolic pressure. 3. Left atrial size was severely dilated. 4. The mitral valve is normal in structure. Trivial mitral valve regurgitation. No evidence of mitral stenosis. 5. The aortic valve is tricuspid. Aortic valve regurgitation is not visualized. Mild to moderate aortic valve sclerosis/calcification is present, without any evidence of aortic stenosis. 6. The inferior vena cava is normal in size with greater than 50% respiratory variability, suggesting right atrial pressure of 3 mmHg.   Treatments: IVF, eliquis  Discharge Exam: Blood pressure (!) 182/79, pulse 60, temperature 98.4 F (36.9 C), temperature source Oral, resp. rate 18, height 5\' 6"  (1.676 m), weight 83.9 kg, SpO2 100 %. General appearance: alert and cooperative Resp: clear to auscultation  bilaterally Cardio: regular rate and rhythm, S1, S2 normal, no murmur, click, rub or gallop GI: soft, non-tender; bowel sounds normal; no masses,  no organomegaly Extremities: extremities normal, atraumatic, no cyanosis or edema  Neuro: Right arm weakness, mild right leg weakness  Disposition: Discharge disposition: 01-Home or Self Care       Discharge Instructions    Ambulatory referral to Hematology / Oncology   Complete by: As directed    Breast cancer   Ambulatory referral to Oncology   Complete by: As directed    Diet - low sodium heart healthy   Complete by: As directed    Increase activity slowly   Complete by: As directed      Allergies as of 03/29/2020   No Known Allergies     Medication List    STOP taking these medications   rivaroxaban 20 MG Tabs tablet Commonly known as: XARELTO     TAKE these medications   amiodarone 200 MG tablet Commonly known as: PACERONE Take 200 mg by mouth daily.   anastrozole 1 MG tablet Commonly known as: ARIMIDEX Take 1 mg by mouth daily.   apixaban 5 MG Tabs tablet Commonly known as: ELIQUIS Take 1 tablet (5 mg total) by mouth 2 (two) times daily.   atorvastatin 40 MG tablet Commonly known as: LIPITOR Take 1 tablet (40 mg total) by mouth daily.   carvedilol 25 MG tablet Commonly known as: COREG Take 25 mg by mouth 2 (two) times daily with a meal.   Entresto 24-26 MG Generic drug: sacubitril-valsartan Take 1 tablet by mouth 2 (two) times daily.       Follow-up Information    Rosalin Hawking, MD Follow up in 3 week(s).   Specialty: Neurology Contact information: [address] [place] [address] [phone]              34 minutes Signed: Sharen Hones 03/29/2020, 9:51 AM

## 2020-03-29 NOTE — Progress Notes (Signed)
STROKE TEAM PROGRESS NOTE   INTERVAL HISTORY Husband at the bedside. Pt lying in bed, still has mild right sided weakness, no significant change from yesterday. PT/OT recommend CIR. MRI brain with contrast no metastasis. On eliquis now.   OBJECTIVE Vitals:   03/29/20 0021 03/29/20 0409 03/29/20 0939 03/29/20 1212  BP: (!) 163/72 (!) 186/94 (!) 182/79 (!) 154/75  Pulse: (!) 52 61 60 (!) 54  Resp: 18   18  Temp: 98.5 F (36.9 C) 98.4 F (36.9 C)  99 F (37.2 C)  TempSrc: Oral Oral  Oral  SpO2: 96% 100%  100%  Weight:      Height:        CBC:  Recent Labs  Lab 03/27/20 1831 03/27/20 1855  WBC 8.3  --   NEUTROABS 4.6  --   HGB 11.6* 13.3  HCT 38.5 39.0  MCV 84.2  --   PLT 328  --     Basic Metabolic Panel:  Recent Labs  Lab 03/28/20 0751 03/28/20 1530 03/29/20 0226 03/29/20 0828  NA  --  139  --  140  K  --  3.3*  --  3.9  CL  --  104  --  108  CO2  --  24  --  24  GLUCOSE  --  155*  --  118*  BUN  --  16  --  14  CREATININE  --  0.95  --  0.79  CALCIUM  --  9.0  --  9.0  MG   < >  --  1.7 1.7   < > = values in this interval not displayed.    Lipid Panel:     Component Value Date/Time   CHOL 207 (H) 03/28/2020 0751   TRIG 73 03/28/2020 0751   HDL 63 03/28/2020 0751   CHOLHDL 3.3 03/28/2020 0751   VLDL 15 03/28/2020 0751   LDLCALC 129 (H) 03/28/2020 0751   HgbA1c:  Lab Results  Component Value Date   HGBA1C 7.2 (H) 03/28/2020   Urine Drug Screen:     Component Value Date/Time   LABOPIA NONE DETECTED 03/28/2020 1534   COCAINSCRNUR NONE DETECTED 03/28/2020 1534   LABBENZ NONE DETECTED 03/28/2020 1534   AMPHETMU NONE DETECTED 03/28/2020 1534   THCU NONE DETECTED 03/28/2020 1534   LABBARB NONE DETECTED 03/28/2020 1534    Alcohol Level No results found for: [place]  IMAGING  CT HEAD WO CONTRAST 03/27/2020 IMPRESSION:  Chronic microangiopathic changes, slightly advanced than would be typically expected for a patient of this age. Correlation for  neuro degenerative risk factors may be helpful for further management. No evidence of acute intracranial hemorrhage or infarct.   MRI Brain W Contrast 03/28/20  IMPRESSION: Redemonstration of multifocal acute and chronic insults, better demonstrated on prior MRI. No abnormal enhancement or mass lesion.  MR BRAIN WO CONTRAST MR ANGIO HEAD WO CONTRAST MR ANGIO NECK WO CONTRAST  03/28/2020 IMPRESSION:  1. Multifocal acute ischemia within the dorsomedial left thalamus and left cerebral peduncle. There are also small foci in the splenium of the corpus callosum and the left basal ganglia.  2. 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  3. Otherwise normal MRA of the head and neck. E  Transthoracic Echocardiogram  1. Left ventricular ejection fraction, by estimation, is 60 to 65%. The  left ventricle has normal function. The left ventricle has no regional  wall motion abnormalities. Left ventricular diastolic parameters are  consistent with Grade  I diastolic  dysfunction (impaired relaxation).  2. Right ventricular systolic function is normal. The right ventricular  size is normal. There is normal pulmonary artery systolic pressure.  3. Left atrial size was severely dilated.  4. The mitral valve is normal in structure. Trivial mitral valve  regurgitation. No evidence of mitral stenosis.  5. The aortic valve is tricuspid. Aortic valve regurgitation is not  visualized. Mild to moderate aortic valve sclerosis/calcification is  present, without any evidence of aortic stenosis.  6. The inferior vena cava is normal in size with greater than 50%  respiratory variability, suggesting right atrial pressure of 3 mmHg.  ECG - SR rate 69 BPM. (See cardiology reading for complete details)  PHYSICAL EXAM  Temp:  [97.6 F (36.4 C)-99 F (37.2 C)] 99 F (37.2 C) (10/31 1212) Pulse Rate:  [52-62] 54 (10/31 1212) Resp:  [16-18] 18 (10/31 1212) BP: (132-186)/(61-94)  154/75 (10/31 1212) SpO2:  [96 %-100 %] 100 % (10/31 1212)  General - Well nourished, well developed, lethargic and sleepy.  Ophthalmologic - fundi not visualized due to noncooperation.  Cardiovascular - Regular rhythm and rate, not in afib.  Mental Status -  Lethargic sleepy but easily arousable, orientation to time, place, and person were intact. Language including expression, naming, repetition, comprehension was assessed and found intact.   Cranial Nerves II - XII - II - Visual field intact OU. III, IV, VI - Extraocular movements intact. V - right facial decreased sensation. VII - right facial droop, mild VIII - Hearing & vestibular intact bilaterally. X - Palate elevates symmetrically. XI - Chin turning & shoulder shrug intact bilaterally. XII - Tongue protrusion intact.  Motor Strength - The patient's strength was normal in left UE and LE, however, RUE 3/5 with drift and decreased right hand dexterity, RLE 3/5 proximal and knee flexion, 4/5 ankle DF/PF. Bulk was normal and fasciculations were absent.   Motor Tone - Muscle tone was assessed at the neck and appendages and was normal.  Reflexes - The patient's reflexes were symmetrical in all extremities and she had no pathological reflexes.  Sensory - Light touch, temperature/pinprick were assessed and were decreased on the right.    Coordination - The patient had normal movements in the hands with no ataxia or dysmetria.  Tremor was absent.  Gait and Station - deferred.   ASSESSMENT/PLAN [name] is a 54 y.o. female with history of atrial fibrillation (xeralto), HTN, CHF and recently diagnosed breast cancer (biopsy proven on arimidex) who presents with right sided weakness and mild confusion. She did not receive IV t-PA due to late presentation (>4.5 hours from time of onset).   Stroke: Acute ischemia within the dorsomedial left thalamus and left cerebral peduncle. There are also small foci in the splenium of the  corpus callosum and the left basal ganglia - embolic - likely from afib  CT head - No evidence of acute intracranial hemorrhage or infarct.   MRI head - Multifocal acute ischemia within the dorsomedial left thalamus and left cerebral peduncle. 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 Otherwise normal MRA of the head and neck.   2D Echo - EF 60-65%, LA severely dilated  [name] - negative  LDL - 129  HgbA1c - 7.2  UDS - negative  VTE prophylaxis - Xarelto  Xarelto (rivaroxaban) daily  prior to admission, given the Xarelto failure, switched to eliquis 5 bid  Patient counseled to be compliant with her antithrombotic medications  Ongoing aggressive stroke risk factor management  Therapy recommendations:  CIR   Disposition:  Pending  Breast cancer  Recently diagnosed breast cancer (biopsy proven)  Has been following with [facility]ology   On anastrozole  Need establish oncology care ASAP  MRI brain with contrast - No abnormal enhancement or mass lesion.  Hypertension  CHF  Home BP meds: Coreg ; Entresto  Current BP meds: Coreg ; Entresto  Stable . Long-term BP goal normotensive . 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  Orthostatic hypotension  On coreg  10/30 get up to bathroom feeling dizzy  Briefly worsening of right side weakness, resolved after lying down  orthostatic vitals today no orthostatic hypotension  TED hose  Afib s/p cardioversion  Currently NSR  Rate controlled  On Xarelto PTA  switched to eliquis given Xarelto failure  Hyperlipidemia  Home Lipid lowering medication: none   LDL 129, goal < 70  Current lipid lowering medication: Lipitor 40 mg daily   Continue statin at discharge  Diabetes  Home diabetic meds: none   Current diabetic meds: none  HgbA1c 7.2, goal < 7.0  CBG  monitoring  DM education  Other Stroke Risk Factors  Previous ETOH use  Previous drug use  Other Active Problems  Code status - Full code  Recently diagnosed breast cancer (biopsy proven on arimidex)   Hypokalemia 3.3 ->3.9  Hospital day # 0  Neurology will sign off. Please call with questions. Pt will follow up with stroke clinic NP at [facility] in about 4 weeks. Thanks for the consult.  Rosalin Hawking, MD PhD Stroke Neurology 03/29/2020 6:06 PM  To contact Stroke Continuity provider, please refer to [email or web address] After hours, contact General Neurology

## 2020-03-30 ENCOUNTER — Encounter (HOSPITAL_COMMUNITY): Payer: Self-pay | Admitting: Internal Medicine

## 2020-03-30 DIAGNOSIS — I480 Paroxysmal atrial fibrillation: Secondary | ICD-10-CM | POA: Diagnosis not present

## 2020-03-30 DIAGNOSIS — I63432 Cerebral infarction due to embolism of left posterior cerebral artery: Principal | ICD-10-CM

## 2020-03-30 LAB — GLUCOSE, CAPILLARY
Glucose-Capillary: 118 mg/dL — ABNORMAL HIGH (ref 70–99)
Glucose-Capillary: 146 mg/dL — ABNORMAL HIGH (ref 70–99)
Glucose-Capillary: 102 mg/dL — ABNORMAL HIGH (ref 70–99)
Glucose-Capillary: 143 mg/dL — ABNORMAL HIGH (ref 70–99)

## 2020-03-30 NOTE — Progress Notes (Signed)
Inpatient Rehab Admissions Coordinator Note:   Per therapy recommendations, pt was screened for CIR candidacy by Shann Medal, PT, DPT.  At this time we are recommending a CIR consult and I will place an order per our protocol.  Please contact me with questions.   Shann Medal, PT, DPT [phone] 03/30/20 10:32 AM

## 2020-03-30 NOTE — Progress Notes (Signed)
[facility] PROGRESS NOTE    Odie Rauen  YBO:[id number] DOB: 17-Sep-1965 DOA: 03/27/2020 PCP: Patient, No Pcp Per      Brief Narrative:  Mrs. Koranda is a 54 y.o. F with CHF, A. fib on Xarelto, recently diagnosed breast cancer, and HTN who presented for right foot drop.  Patiently moved from the [place] to [place], diagnosed with breast cancer by biopsy in a [place] hospital, also found to have CHF and A. fib at that time, placed on amiodarone, Coreg and Xarelto.  Last 24 hours, patient developed difficulty Waqas of right foot drop and seems slightly confused.  In the ER, CT head unremarkable.      Assessment & Plan:  Stroke MRI brain showed multifocal cortical infarcts.   -Non-invasive angiography showed no significant atherosclerosis -Echocardiogram showed no cardiogenic source of embolism -Carotid imaging unremarkable   -Lipids ordered: Continue atorvastatin -Aspirin not ordered, already on full dose anticoagulation -Atrial fibrillation: Previously known, transition from Xarelto to apixaban -tPA not given because already on blood thinner, outside the stroke window -Dysphagia screen ordered in ER -PT eval ordered: recommended CIR -Smoking cessation: Not pertinent   Chronic diastolic CHF Echocardiogram shows normal EF -Continue amiodarone, atorvastatin, carvedilol -Continue Entresto for now, will need to obtain records from the outside hospital  Breast cancer -Continue anastrozole          Disposition: Status is: Inpatient  Remains inpatient appropriate because:Unsafe d/c plan   Dispo: The patient is from: Home              Anticipated d/c is to: CIR              Anticipated d/c date is: 1 day              Patient currently is medically stable to d/c.              MDM: The below labs and imaging reports were reviewed and summarized above.  Medication management as above.   DVT prophylaxis: Place TED hose Start:  03/28/20 1528 apixaban (ELIQUIS) tablet 5 mg  Code Status: FULL Family Communication: Husband at bedside          Subjective: Feeling okay.  Still right sided hemiparesis.  No fever, confusion.  No seizures.  Objective: Vitals:   03/30/20 0846 03/30/20 0900 03/30/20 1145 03/30/20 1615  BP: (!) 174/82  136/76 115/66  Pulse: (!) 55 68 (!) 53 (!) 56  Resp: 19  17 20   Temp: 98.2 F (36.8 C)  97.9 F (36.6 C) 98.5 F (36.9 C)  TempSrc: Oral  Oral Oral  SpO2:   100% 100%  Weight:      Height:        Intake/Output Summary (Last 24 hours) at 03/30/2020 1659 Last data filed at 03/30/2020 0400 Gross per 24 hour  Intake --  Output 700 ml  Net -700 ml   Filed Weights   03/27/20 1822  Weight: 83.9 kg    Examination: General appearance:   adult female, alert and in no acute distress.   HEENT: Anicteric, conjunctiva pink, lids and lashes normal. No nasal deformity, discharge, epistaxis.  Lips moist.   Skin: Warm and dry.  no jaundice.  No suspicious rashes or lesions. Cardiac: RRR, nl S1-S2, no murmurs appreciated.  Capillary refill is brisk.  JVP not visible.  No LE edema.  Radial  pulses 2+ and symmetric. Respiratory: Normal respiratory rate and rhythm.  CTAB without rales or wheezes. Abdomen: Abdomen soft.  no TTP or guarding. No ascites, distension, hepatosplenomegaly.   MSK: No deformities or effusions. Neuro: Awake and alert.  EOMI, right sided weakness noted.   Speech fluent.    Psych: Sensorium intact and responding to questions, attention normal. Affect normal.  Judgment and insight appear normal.    Data Reviewed: I have personally reviewed following labs and imaging studies:  CBC: Recent Labs  Lab 03/27/20 1831 03/27/20 1855  WBC 8.3  --   NEUTROABS 4.6  --   HGB 11.6* 13.3  HCT 38.5 39.0  MCV 84.2  --   PLT 328  --    Basic Metabolic Panel: Recent Labs  Lab 03/27/20 1831 03/27/20 1855 03/28/20 0751 03/28/20 1530 03/29/20 0226 03/29/20 0828   NA 139 142 139 139  --  140  K 3.3* 3.2* 3.3* 3.3*  --  3.9  CL 105 104 105 104  --  108  CO2 25  --  24 24  --  24  GLUCOSE 175* 171* 121* 155*  --  118*  BUN 11 12 16 16   --  14  CREATININE 0.88 0.80 0.82 0.95  --  0.79  CALCIUM 9.1  --  9.3 9.0  --  9.0  MG  --   --  1.8  --  1.7 1.7   GFR: Estimated Creatinine Clearance: 88.7 mL/min (by C-G formula based on SCr of 0.79 mg/dL). Liver Function Tests: Recent Labs  Lab 03/27/20 1831  AST 22  ALT 16  ALKPHOS 58  BILITOT 0.9  PROT 7.9  ALBUMIN 3.1*   No results for input(s): LIPASE, AMYLASE in the last 168 hours. No results for input(s): AMMONIA in the last 168 hours. Coagulation Profile: Recent Labs  Lab 03/27/20 1831  INR 1.2   Cardiac Enzymes: No results for input(s): CKTOTAL, CKMB, CKMBINDEX, TROPONINI in the last 168 hours. BNP (last 3 results) No results for input(s): PROBNP in the last 8760 hours. HbA1C: Recent Labs    03/28/20 0751  HGBA1C 7.2*   CBG: Recent Labs  Lab 03/29/20 1517 03/29/20 2158 03/30/20 0607 03/30/20 1150 03/30/20 1620  GLUCAP 177* 129* 118* 146* 102*   Lipid Profile: Recent Labs    03/28/20 0751  CHOL 207*  HDL 63  LDLCALC 129*  TRIG 73  CHOLHDL 3.3   Thyroid Function Tests: No results for input(s): TSH, T4TOTAL, FREET4, T3FREE, THYROIDAB in the last 72 hours. Anemia Panel: No results for input(s): VITAMINB12, FOLATE, FERRITIN, TIBC, IRON, RETICCTPCT in the last 72 hours. Urine analysis: No results found for: COLORURINE, APPEARANCEUR, LABSPEC, PHURINE, GLUCOSEU, HGBUR, BILIRUBINUR, KETONESUR, PROTEINUR, UROBILINOGEN, NITRITE, LEUKOCYTESUR Sepsis Labs: @LABRCNTIP (procalcitonin:4,lacticacidven:4)  ) Recent Results (from the past 240 hour(s))  Respiratory Panel by RT PCR (Flu A&B, Covid) - Nasopharyngeal Swab     Status: None   Collection Time: 03/28/20  2:13 AM   Specimen: Nasopharyngeal Swab  Result Value Ref Range Status   SARS Coronavirus 2 by RT PCR NEGATIVE  NEGATIVE Final    Comment: (NOTE) SARS-CoV-2 target nucleic acids are NOT DETECTED.  The SARS-CoV-2 RNA is generally detectable in upper respiratoy specimens during the acute phase of infection. The lowest concentration of SARS-CoV-2 viral copies this assay can detect is 131 copies/mL. A negative result does not preclude SARS-Cov-2 infection and should not be used as the sole basis for treatment or other patient management decisions. A negative result may occur with  improper specimen collection/handling, submission of specimen other than nasopharyngeal swab, presence of viral mutation(s) within the  areas targeted by this assay, and inadequate number of viral copies (<131 copies/mL). A negative result must be combined with clinical observations, patient history, and epidemiological information. The expected result is Negative.  Fact Sheet for Patients:  PinkCheek.be  Fact Sheet for Healthcare Providers:  GravelBags.it  This test is no t yet approved or cleared by the [place] FDA and  has been authorized for detection and/or diagnosis of SARS-CoV-2 by FDA under an Emergency Use Authorization (EUA). This EUA will remain  in effect (meaning this test can be used) for the duration of the COVID-19 declaration under Section 564(b)(1) of the Act, 21 U.S.C. section 360bbb-3(b)(1), unless the authorization is terminated or revoked sooner.     Influenza A by PCR NEGATIVE NEGATIVE Final   Influenza B by PCR NEGATIVE NEGATIVE Final    Comment: (NOTE) The Xpert Xpress SARS-CoV-2/FLU/RSV assay is intended as an aid in  the diagnosis of influenza from Nasopharyngeal swab specimens and  should not be used as a sole basis for treatment. Nasal washings and  aspirates are unacceptable for Xpert Xpress SARS-CoV-2/FLU/RSV  testing.  Fact Sheet for Patients: PinkCheek.be  Fact Sheet for Healthcare  Providers: GravelBags.it  This test is not yet approved or cleared by the [place] FDA and  has been authorized for detection and/or diagnosis of SARS-CoV-2 by  FDA under an Emergency Use Authorization (EUA). This EUA will remain  in effect (meaning this test can be used) for the duration of the  Covid-19 declaration under Section 564(b)(1) of the Act, 21  U.S.C. section 360bbb-3(b)(1), unless the authorization is  terminated or revoked. Performed at [facility], [place] [address] [place], [place] [address]          Radiology Studies: MR BRAIN W CONTRAST  Result Date: 03/28/2020 CLINICAL DATA:  Breast cancer, staging. EXAM: MRI HEAD WITH CONTRAST TECHNIQUE: Multiplanar, multiecho pulse sequences of the brain and surrounding structures were obtained with intravenous contrast. CONTRAST:  20mL GADAVIST GADOBUTROL 1 MMOL/ML IV SOLN COMPARISON:  03/28/2020 CT and MRI head. FINDINGS: Brain: Redemonstration of multifocal acute infarcts, better demonstrated on prior MRI. Chronic lacunar insults involving the bilateral basal ganglia and right thalamus. Remote bilateral centrum semiovale insults. No acute intracranial hemorrhage. No midline shift, ventriculomegaly or extra-axial fluid collection. No mass lesion. No abnormal enhancement. Vascular: Please see recent MRA head. Skull and upper cervical spine: Normal marrow signal. Sinuses/Orbits: Normal orbits. Pneumatized paranasal sinuses and mastoid air cells. Other: None. IMPRESSION: Redemonstration of multifocal acute and chronic insults, better demonstrated on prior MRI. No abnormal enhancement or mass lesion. Electronically Signed   By: Primitivo Gauze M.D.   On: 03/28/2020 19:29        Scheduled Meds:  amiodarone  200 mg Oral Daily   anastrozole  1 mg Oral Daily   apixaban  5 mg Oral BID   atorvastatin  40 mg Oral Daily   carvedilol  25 mg Oral BID WC   sacubitril-valsartan  1 tablet Oral  BID   Continuous Infusions:   LOS: 1 day    Time spent: 25 minutes    Edwin Dada, MD [facility] 03/30/2020, 4:59 PM     Please page though [place] or Epic secure chat:  For [employer], Adult nurse

## 2020-03-30 NOTE — Consult Note (Signed)
Physical Medicine and Rehabilitation Consult   Reason for Consult:Stroke with functional deficits.  Referring Physician: Dr. Loleta Books.    HPI: Courtney Coleman is a 54 y.o. female with history of A. fib s/p cardioversion, CHF, HTN, left breast mass s/p biopsy positive for cancer (12/2019) [place] --started on Arimidex but had issues with heart requiring multiple hospitalization. She moved to [place] a week ago to be closer to niece.  She was admitted on 03/28/2020 with 24-hour history of right foot weakness with difficulty walking and mild confusion.  UDS negative.  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  2D echo showed EF 60 to 65% with no wall abnormality, mild to moderate aortic sclerosis and severe left atrial dilatation.  Dr. Erlinda Hong felt stroke embolic likely from A. Fib--to avoid low BP given L-PCA stenosis.  Patient's home Xarelto was changed to Eliquis.  Therapy evaluations completed today revealing expressive/receptive aphasia with frequent paraphasias and difficulty with complex commands , right inattention, RLE/RUE weakness as well as orthostasis affecting ADLs and mobility.  CIR was recommended due to functional decline.  Patient and fiancee from [facility] here a week ago. Niece has records and reports that she needs to be evaluated by surgical oncologist--has multiple questions regarding follow up/care.   Review of Systems  Constitutional: Negative for chills and fever.  HENT: Negative for hearing loss and tinnitus.   Eyes: Negative for blurred vision and double vision.  Respiratory: Negative for cough and shortness of breath.   Cardiovascular: Negative for chest pain and palpitations.  Gastrointestinal: Negative for constipation, heartburn and nausea.  Genitourinary: Negative for dysuria and urgency.  Musculoskeletal:  Positive for joint pain (left shoulder pain--especially at nights). Negative for myalgias.  Skin: Negative for rash.  Neurological: Positive for dizziness, speech change, focal weakness and weakness. Negative for headaches.  Psychiatric/Behavioral: The patient is nervous/anxious.      Past Medical History:  Diagnosis Date  . Breast cancer ([place])   . CHF (congestive heart failure) ([place])   . Dysrhythmia   . Hypertension     Past Surgical History:  Procedure Laterality Date  . BREAST BIOPSY      Family History  Problem Relation Age of Onset  . Cancer Neg Hx     Social History:  Lives with family (niece). Fiance had ruptured aneurysm 2009 but independent. She reports that she has never smoked. She has never used smokeless tobacco. She reports previous alcohol use. She does not use any illicit drugs.     Allergies: No Known Allergies    Medications Prior to Admission  Medication Sig Dispense Refill  . amiodarone (PACERONE) 200 MG tablet Take 200 mg by mouth daily.    Marland Kitchen anastrozole (ARIMIDEX) 1 MG tablet Take 1 mg by mouth daily.    . carvedilol (COREG) 25 MG tablet Take 25 mg by mouth 2 (two) times daily with a meal.    . rivaroxaban (XARELTO) 20 MG TABS tablet Take 20 mg by mouth daily with supper.    . sacubitril-valsartan (ENTRESTO) 24-26 MG Take 1 tablet by mouth 2 (two) times daily.      Home: Home Living Family/patient expects to be discharged to:: Private residence Living Arrangements: Spouse/significant other Available Help at Discharge: Family, Available 24 hours/day Type of Home: House Home Access: Stairs to enter [employer] of Steps: 1 Home Layout: Two  level, Bed/bath upstairs, 1/2 bath on main level, Able to live on main level with bedroom/bathroom Alternate Level Stairs-Number of Steps: 15 Alternate Level Stairs-Rails: Left Bathroom Shower/Tub: Multimedia programmer: Standard Home Equipment: Shower seat - built in, [employer] -  tub/shower  Lives With: Significant other  Functional History: Prior Function Level of Independence: Independent Comments: not working  Functional Status:  Mobility: Bed Mobility Overal bed mobility: Needs Assistance Bed Mobility: Supine to Sit Supine to sit: Min assist, +2 for physical assistance General bed mobility comments: assist for trunk and LE management, scooting to EOB. Transfers Overall transfer level: Needs assistance Equipment used: Rolling walker (2 wheeled) Transfers: Sit to/from Stand Sit to Stand: Mod assist, +2 safety/equipment, +2 physical assistance Stand pivot transfers: Mod assist General transfer comment: Mod assist for power up, steadying, R hand placement on RW. Verbal cuing for sequencing, safe hand placement when rising/sitting. STS x2. Ambulation/Gait Ambulation/Gait assistance: Mod assist, +2 physical assistance Gait Distance (Feet): 20 Feet (+15) Assistive device: Rolling walker (2 wheeled) Gait Pattern/deviations: Step-through pattern, Decreased dorsiflexion - right, Drifts right/left, Decreased step length - right, Decreased weight shift to right General Gait Details: Mod +2 for facilitating swing phase, stance phase blocking R knee flexion to prevent buckling but also blocking R knee recurvatum, steadying, placing and holding RUE on RW. Seated rest break x1 after first 15 ft ambulation. Gait velocity: decr    ADL: ADL Overall ADL's : Needs assistance/impaired Eating/Feeding: Independent, Sitting Grooming: Independent, Sitting Upper Body Bathing: Set up, Sitting Lower Body Bathing: Minimal assistance, Sit to/from stand, Sitting/lateral leans Upper Body Dressing : Set up, Sitting Lower Body Dressing: Minimal assistance, Sit to/from stand, Sitting/lateral leans Lower Body Dressing Details (indicate cue type and reason): pt was able to don bil socks with set up assist (did struggle using R hand to pull up at sock), requires min A when sit <> stand to  pull up pants due to unsteadiness Toilet Transfer: Min guard, Minimal assistance, RW, Ambulation, Regular Toilet, Grab bars Toileting- Clothing Manipulation and Hygiene: Set up, Sitting/lateral lean, Sit to/from stand Functional mobility during ADLs: Min guard, Minimal assistance, Cueing for safety, Cueing for sequencing, Rolling walker  Cognition: Cognition Overall Cognitive Status: Difficult to assess Arousal/Alertness: Awake/alert Orientation Level: Oriented to person, Oriented to place Attention: Sustained, Selective Sustained Attention: Appears intact Selective Attention: Appears intact Memory:  (NT) Awareness: Impaired Awareness Impairment: Emergent impairment Cognition Arousal/Alertness: Awake/alert Behavior During Therapy: Flat affect Overall Cognitive Status: Difficult to assess Area of Impairment: Problem solving, Safety/judgement, Attention, Following commands, Awareness Current Attention Level: Sustained Memory: Decreased short-term memory Following Commands: Follows one step commands with increased time Safety/Judgement: Decreased awareness of deficits, Decreased awareness of safety Awareness: Intellectual Problem Solving: Slow processing, Decreased initiation, Requires verbal cues, Difficulty sequencing, Requires tactile cues General Comments: R inattention requiring multimodla cuing to attend to R especially R hand placement on RW. Decreased safety awareness, especially with fatigue. Difficult to assess due to: Impaired communication  Blood pressure (!) 174/82, pulse 68, temperature 98.2 F (36.8 C), temperature source Oral, resp. rate 19, height 5\' 6"  (1.676 m), weight 83.9 kg, SpO2 96 %. General: Alert and oriented x 3, No apparent distress HEENT: Head is normocephalic, right visual field cut Neck: Supple without JVD or lymphadenopathy Heart: Reg rate and rhythm. No murmurs rubs or gallops Chest: CTA bilaterally without wheezes, rales, or rhonchi; no  distress Abdomen: Soft, non-tender, non-distended, bowel sounds positive. Extremities: No clubbing, cyanosis, or edema. Pulses are 2+ Skin: Clean  and intact without signs of breakdown Neuro: Pt is cognitively appropriate with normal insight, memory, and awareness. Cranial nerves 2-12 are intact. Speech clear. Sensation is decreased throughout right side. Able to follow basic commands without difficulty.  +paraphasias 5/5 strength on left side.  RUE: 3/5 throughout RLE: 3/5 HF, KE, 2/5 DF, PF Psych: Pt's affect is appropriate. Pt is cooperative. Great attitude.  Results for orders placed or performed during the hospital encounter of 03/27/20 (from the past 24 hour(s))  Glucose, capillary     Status: Abnormal   Collection Time: 03/29/20 12:07 PM  Result Value Ref Range   Glucose-Capillary 126 (H) 70 - 99 mg/dL  Glucose, capillary     Status: Abnormal   Collection Time: 03/29/20  3:17 PM  Result Value Ref Range   Glucose-Capillary 177 (H) 70 - 99 mg/dL  Glucose, capillary     Status: Abnormal   Collection Time: 03/29/20  9:58 PM  Result Value Ref Range   Glucose-Capillary 129 (H) 70 - 99 mg/dL  Glucose, capillary     Status: Abnormal   Collection Time: 03/30/20  6:07 AM  Result Value Ref Range   Glucose-Capillary 118 (H) 70 - 99 mg/dL   MR BRAIN W CONTRAST  Result Date: 03/28/2020 CLINICAL DATA:  Breast cancer, staging. EXAM: MRI HEAD WITH CONTRAST TECHNIQUE: Multiplanar, multiecho pulse sequences of the brain and surrounding structures were obtained with intravenous contrast. CONTRAST:  18mL GADAVIST GADOBUTROL 1 MMOL/ML IV SOLN COMPARISON:  03/28/2020 CT and MRI head. FINDINGS: Brain: Redemonstration of multifocal acute infarcts, better demonstrated on prior MRI. Chronic lacunar insults involving the bilateral basal ganglia and right thalamus. Remote bilateral centrum semiovale insults. No acute intracranial hemorrhage. No midline shift, ventriculomegaly or extra-axial fluid collection.  No mass lesion. No abnormal enhancement. Vascular: Please see recent MRA head. Skull and upper cervical spine: Normal marrow signal. Sinuses/Orbits: Normal orbits. Pneumatized paranasal sinuses and mastoid air cells. Other: None. IMPRESSION: Redemonstration of multifocal acute and chronic insults, better demonstrated on prior MRI. No abnormal enhancement or mass lesion. Electronically Signed   By: Primitivo Gauze M.D.   On: 03/28/2020 19:29     Assessment/Plan: Diagnosis: Acute left thalamic infarct 1. Does the need for close, 24 hr/day medical supervision in concert with the patient's rehab needs make it unreasonable for this patient to be served in a less intensive setting? Yes 2. Co-Morbidities requiring supervision/potential complications: DM Type 2, hyperlipidemia, atrial fibrillation, breast cancer, HTN, CHF 3. Due to bladder management, bowel management, safety, skin/wound care, disease management, medication administration, pain management and patient education, does the patient require 24 hr/day rehab nursing? Yes 4. Does the patient require coordinated care of a physician, rehab nurse, therapy disciplines of PT, OT to address physical and functional deficits in the context of the above medical diagnosis(es)? Yes Addressing deficits in the following areas: balance, endurance, locomotion, strength, transferring, bowel/bladder control, bathing, dressing, feeding, grooming, toileting and psychosocial support, cognition, language 5. Can the patient actively participate in an intensive therapy program of at least 3 hrs of therapy per day at least 5 days per week? Yes 6. The potential for patient to make measurable gains while on inpatient rehab is excellent 7. Anticipated functional outcomes upon discharge from inpatient rehab are modified independent  with PT, modified independent with OT, modified independent with SLP. 8. Estimated rehab length of stay to reach the above functional goals is:  10-14 days 9. Anticipated discharge destination: Home 10. Overall Rehab/Functional Prognosis: excellent  RECOMMENDATIONS: This patient's  condition is appropriate for continued rehabilitative care in the following setting: CIR Patient has agreed to participate in recommended program. Yes Note that insurance prior authorization may be required for reimbursement for recommended care.  Comment: Thank you for this consult. Admission coordinator to follow.   I have personally performed a face to face diagnostic evaluation, including, but not limited to relevant history and physical exam findings, of this patient and developed relevant assessment and plan.  Additionally, I have reviewed and concur with the physician assistant's documentation above.  Leeroy Cha, MD  Courtney Leriche, PA-C 03/30/2020

## 2020-03-30 NOTE — Progress Notes (Signed)
Physical Therapy Treatment Patient Details Name: Courtney Coleman MRN: [id number] DOB: 1966/05/22 Today's Date: 03/30/2020    History of Present Illness 54 y.o. female  PMHx arrhythmia (xeralto), HTN, CHF and breast cancer (biopsy proven on arimidex) with right sided weakness and numbness. MRI- Multifocal acute ischemia within the dorsomedial left thalamus and left cerebral peduncle; also small foci in the splenium of the corpus callosum and the left basal ganglia; occlusion Lt posterior cerebral artery. 10/30 pre-syncopal episode with nursing with RLE buckling. +orthostasis 10/31 with PT    PT Comments    Pt with no complaints of dizziness with positional changes this day, and is eager to progress mobility. Pt required mod +2 assist for mobility, tolerating room distance ambulation with PT assisting in RLE progression during swing and blocking during stance phase. Pt continues to demonstrate decreased attention to R, requiring frequent multimodal cuing for R hand placement on RW handle. PT continuing to recommend CIR post-acutely.     Follow Up Recommendations  CIR;Supervision/Assistance - 24 hour     Equipment Recommendations  Other (comment) (TBD as progresses)    Recommendations for Other Services Rehab consult     Precautions / Restrictions Precautions Precautions: Fall Restrictions Weight Bearing Restrictions: No    Mobility  Bed Mobility Overal bed mobility: Needs Assistance Bed Mobility: Supine to Sit     Supine to sit: Min assist;+2 for physical assistance     General bed mobility comments: assist for trunk and LE management, scooting to EOB.  Transfers Overall transfer level: Needs assistance Equipment used: Rolling walker (2 wheeled) Transfers: Sit to/from Stand Sit to Stand: Mod assist;+2 safety/equipment;+2 physical assistance         General transfer comment: Mod assist for power up, steadying, R hand placement on RW. Verbal cuing for sequencing, safe hand  placement when rising/sitting. STS x2.  Ambulation/Gait Ambulation/Gait assistance: Mod assist;+2 physical assistance Gait Distance (Feet): 20 Feet (+15) Assistive device: Rolling walker (2 wheeled) Gait Pattern/deviations: Step-through pattern;Decreased dorsiflexion - right;Drifts right/left;Decreased step length - right;Decreased weight shift to right Gait velocity: decr   General Gait Details: Mod +2 for facilitating swing phase, stance phase blocking R knee flexion to prevent buckling but also blocking R knee recurvatum, steadying, placing and holding RUE on RW. Seated rest break x1 after first 15 ft ambulation.   Stairs             Wheelchair Mobility    Modified Rankin (Stroke Patients Only) Modified Rankin (Stroke Patients Only) Pre-Morbid Rankin Score: No symptoms Modified Rankin: Moderately severe disability     Balance Overall balance assessment: Needs assistance Sitting-balance support: Feet supported;No upper extremity supported Sitting balance-Leahy Scale: Fair     Standing balance support: Bilateral upper extremity supported;During functional activity Standing balance-Leahy Scale: Poor Standing balance comment: reliant on external support                            Cognition Arousal/Alertness: Awake/alert Behavior During Therapy: Flat affect Overall Cognitive Status: Difficult to assess Area of Impairment: Problem solving;Safety/judgement;Attention;Following commands;Awareness                   Current Attention Level: Sustained   Following Commands: Follows one step commands with increased time Safety/Judgement: Decreased awareness of deficits;Decreased awareness of safety Awareness: Intellectual Problem Solving: Slow processing;Decreased initiation;Requires verbal cues;Difficulty sequencing;Requires tactile cues General Comments: R inattention requiring multimodla cuing to attend to R especially R hand placement on RW. Decreased  safety awareness, especially with fatigue.      Exercises General Exercises - Lower Extremity Ankle Circles/Pumps: AROM;Both;10 reps;Seated Long Arc Quad: AAROM;Right;15 reps;Seated Hip ABduction/ADduction: AAROM;Right;10 reps;Supine Hip Flexion/Marching: AAROM;Right;10 reps;Seated    General Comments        Pertinent Vitals/Pain Pain Assessment: No/denies pain    Home Living                      Prior Function            PT Goals (current goals can now be found in the care plan section) Acute Rehab PT Goals Patient Stated Goal: return home PT Goal Formulation: With patient Time For Goal Achievement: 04/11/20 Potential to Achieve Goals: Good Progress towards PT goals: Progressing toward goals    Frequency    Min 4X/week      PT Plan Current plan remains appropriate    Co-evaluation PT/OT/SLP Co-Evaluation/Treatment: Yes Reason for Co-Treatment: For patient/therapist safety;To address functional/ADL transfers          AM-PAC PT "6 Clicks" Mobility   Outcome Measure  Help needed turning from your back to your side while in a flat bed without using bedrails?: None Help needed moving from lying on your back to sitting on the side of a flat bed without using bedrails?: A Little Help needed moving to and from a bed to a chair (including a wheelchair)?: A Lot Help needed standing up from a chair using your arms (e.g., wheelchair or bedside chair)?: A Lot Help needed to walk in hospital room?: A Lot Help needed climbing 3-5 steps with a railing? : Total 6 Click Score: 14    End of Session Equipment Utilized During Treatment: Gait belt Activity Tolerance: Patient tolerated treatment well;Patient limited by fatigue Patient left: with call bell/phone within reach;with family/visitor present;in chair;with chair alarm set Nurse Communication: Mobility status PT Visit Diagnosis: Hemiplegia and hemiparesis;Other abnormalities of gait and mobility  (R26.89) Hemiplegia - Right/Left: Right Hemiplegia - dominant/non-dominant: Dominant Hemiplegia - caused by: Cerebral infarction     Time: 0842-0908 PT Time Calculation (min) (ACUTE ONLY): 26 min  Charges:  $Gait Training: 8-22 mins                     Tanveer Brammer E, PT Acute Rehabilitation Services Pager [phone]  Office [phone]     Orion Vandervort D Nasiir Monts 03/30/2020, 10:24 AM

## 2020-03-30 NOTE — Progress Notes (Signed)
Occupational Therapy Treatment Patient Details Name: Courtney Coleman MRN: [id number] DOB: July 19, 1965 Today's Date: 03/30/2020    History of present illness 54 y.o. female  PMHx arrhythmia (xeralto), HTN, CHF and breast cancer (biopsy proven on arimidex) with right sided weakness and numbness. MRI- Multifocal acute ischemia within the dorsomedial left thalamus and left cerebral peduncle; also small foci in the splenium of the corpus callosum and the left basal ganglia; occlusion Lt posterior cerebral artery. 10/30 pre-syncopal episode with nursing with RLE buckling. +orthostasis 10/31 with PT   OT comments  Pt currently requires total +2 mod (A) for basic transfer with tactile input to the R LE and R UE. Pt needs cues to visualize R hand to sustain R hand on RW. Pt with decreased attention and proprioception to R side. Pt will d/c back home to [place] so must maximize independence and CIR recommendation updated at this time.    Follow Up Recommendations  Other (comment);CIR    Equipment Recommendations  3 in 1 bedside commode    Recommendations for Other Services      Precautions / Restrictions Precautions Precautions: Fall       Mobility Bed Mobility Overal bed mobility: Needs Assistance Bed Mobility: Supine to Sit     Supine to sit: Min assist;+2 for physical assistance     General bed mobility comments: assist for trunk and LE management, scooting to EOB.  Transfers Overall transfer level: Needs assistance Equipment used: Rolling walker (2 wheeled) Transfers: Sit to/from Stand Sit to Stand: Mod assist;+2 safety/equipment;+2 physical assistance         General transfer comment: Mod assist for power up, steadying, R hand placement on RW. Verbal cuing for sequencing, safe hand placement when rising/sitting. STS x2.    Balance Overall balance assessment: Needs assistance Sitting-balance support: Feet supported;No upper extremity supported Sitting balance-Leahy Scale:  Fair     Standing balance support: Bilateral upper extremity supported;During functional activity Standing balance-Leahy Scale: Poor Standing balance comment: reliant on external support                           ADL either performed or assessed with clinical judgement   ADL Overall ADL's : Needs assistance/impaired   Eating/Feeding Details (indicate cue type and reason): family in room helping feed patient due to R UE deficits Grooming: Wash/dry face;Minimal assistance;Standing Grooming Details (indicate cue type and reason): washing face with L hand and R UE on counter for support hand over hand for weight bearing             Lower Body Dressing: Maximal assistance   Toilet Transfer: +2 for physical assistance;Minimal assistance           Functional mobility during ADLs: +2 for physical assistance;Moderate assistance General ADL Comments: pt with R knee buckling and lack of awareness to fall risk and fatigue in R LE. pt needs cues to stop and reposition R side     Vision       Perception     Praxis      Cognition Arousal/Alertness: Awake/alert Behavior During Therapy: Flat affect Overall Cognitive Status: Difficult to assess Area of Impairment: Problem solving;Safety/judgement;Attention;Following commands;Awareness                   Current Attention Level: Sustained   Following Commands: Follows one step commands with increased time Safety/Judgement: Decreased awareness of deficits;Decreased awareness of safety Awareness: Intellectual Problem Solving: Slow processing;Decreased initiation;Requires verbal cues;Difficulty  sequencing;Requires tactile cues General Comments: R inattention requiring multimodla cuing to attend to R especially R hand placement on RW. Decreased safety awareness, especially with fatigue.        Exercises Exercises: Other exercises Other Exercises Other Exercises: medibridge [id number]   Shoulder Instructions        General Comments      Pertinent Vitals/ Pain       Pain Assessment: No/denies pain  Home Living                                          Prior Functioning/Environment              Frequency  Min 3X/week        Progress Toward Goals  OT Goals(current goals can now be found in the care plan section)  Progress towards OT goals: Progressing toward goals  Acute Rehab OT Goals Patient Stated Goal: return home OT Goal Formulation: With patient/family Time For Goal Achievement: 04/11/20 Potential to Achieve Goals: Good ADL Goals Pt Will Perform Lower Body Dressing: with modified independence;sitting/lateral leans;sit to/from stand Pt Will Transfer to Toilet: with modified independence;ambulating;regular height toilet Pt/caregiver will Perform Home Exercise Program: Increased strength;Left upper extremity;With written HEP provided Additional ADL Goal #1: Pt will complete multistep ADL task with min VC's for safe and successful completion  Plan Discharge plan needs to be updated    Co-evaluation    PT/OT/SLP Co-Evaluation/Treatment: Yes Reason for Co-Treatment: For patient/therapist safety   OT goals addressed during session: ADL's and self-care      AM-PAC OT "6 Clicks" Daily Activity     Outcome Measure   Help from another person eating meals?: A Little Help from another person taking care of personal grooming?: A Little Help from another person toileting, which includes using toliet, bedpan, or urinal?: A Little Help from another person bathing (including washing, rinsing, drying)?: A Little Help from another person to put on and taking off regular upper body clothing?: A Little Help from another person to put on and taking off regular lower body clothing?: A Little 6 Click Score: 18    End of Session Equipment Utilized During Treatment: Gait belt;Rolling walker  OT Visit Diagnosis: Unsteadiness on feet (R26.81);Other abnormalities of  gait and mobility (R26.89);Muscle weakness (generalized) (M62.81);Other symptoms and signs involving cognitive function   Activity Tolerance Patient tolerated treatment well   Patient Left in chair;with call bell/phone within reach;with chair alarm set;with nursing/sitter in room;with family/visitor present   Nurse Communication Mobility status;Precautions        Time: 8828-0034 OT Time Calculation (min): 41 min  Charges: OT General Charges $OT Visit: 1 Visit OT Treatments $Self Care/Home Management : 8-22 mins   Brynn, OTR/L  Acute Rehabilitation Services Pager: [phone] Office: [phone] .    Jeri Modena 03/30/2020, 3:46 PM

## 2020-03-31 ENCOUNTER — Other Ambulatory Visit: Payer: Self-pay | Admitting: Oncology

## 2020-03-31 ENCOUNTER — Telehealth: Payer: Self-pay | Admitting: Hematology and Oncology

## 2020-03-31 DIAGNOSIS — I480 Paroxysmal atrial fibrillation: Secondary | ICD-10-CM | POA: Diagnosis not present

## 2020-03-31 DIAGNOSIS — D0590 Unspecified type of carcinoma in situ of unspecified breast: Secondary | ICD-10-CM

## 2020-03-31 LAB — GLUCOSE, CAPILLARY
Glucose-Capillary: 114 mg/dL — ABNORMAL HIGH (ref 70–99)
Glucose-Capillary: 156 mg/dL — ABNORMAL HIGH (ref 70–99)
Glucose-Capillary: 118 mg/dL — ABNORMAL HIGH (ref 70–99)

## 2020-03-31 NOTE — Telephone Encounter (Signed)
Received a msg from [employer] to schedule a new pt appt for breast cancer. Courtney Coleman has been scheduled to see Dr. Lindi Adie on 11/18 at 1pm. Pt is currently in the hospital. Appt will be added to her discharge summary.

## 2020-03-31 NOTE — Progress Notes (Signed)
[facility] PROGRESS NOTE    Courtney Coleman  NID:[id number] DOB: [birth year] DOA: 03/27/2020 PCP: Patient, No Pcp Per      Brief Narrative:  Courtney Coleman is a 54 y.o. F with CHF, A. fib on Xarelto, recently diagnosed breast cancer, and HTN who presented for right foot drop.  Patiently moved from the [place] to [place], diagnosed with breast cancer by biopsy in a [place] hospital, also found to have CHF and A. fib at that time, placed on amiodarone, Coreg and Xarelto.  Last 24 hours, patient developed difficulty Courtney Coleman of right foot drop and seems slightly confused.  In the ER, CT head unremarkable.      Assessment & Plan:  Stroke MRI brain showed multifocal cortical infarcts.   Non-invasive angiography showed no significant atherosclerosis. Echocardiogram showed no cardiogenic source of embolism.  Atrial fibrillation known and patient on Xarelto.  Carotid imaging unremarkable    -Continue atorvastatin -Xarelto transitioned to Apixaban  -Neuro follow up scheduled -PT eval ordered: recommended CIR     Chronic diastolic CHF This was recently diagnosed in [place] at [facility] in [place].  Started on amiodarone and Entresto there.  Echocardiogram here shows normal EF  -Continue amiodarone, atorvastatin, carvedilol -Continue Entresto for now -Cardiology follow up after discharge arranged -Have requested records from [facility], these are pending Patient believes she was at the following hospital: [facility] [address] Dr. Wylene Men [facility] [phone]    Breast cancer -Continue anastrozole -Referral to Heme-Onc after discharge has been arranged -Outside records are pending            Disposition: Status is: Inpatient  Remains inpatient appropriate because:Unsafe d/c plan   Dispo: The patient is from: Home              Anticipated d/c is to: CIR              Anticipated d/c date is: 1 day              Patient currently is  medically stable to d/c.              MDM: The below labs and imaging reports were reviewed and summarized above.  Medication management as above.   DVT prophylaxis: Place TED hose Start: 03/28/20 1528 apixaban (ELIQUIS) tablet 5 mg  Code Status: FULL Family Communication: Husband at bedside          Subjective: No new headache, fever, confusion, seizures, weakness.     Objective: Vitals:   03/31/20 0833 03/31/20 0930 03/31/20 1159 03/31/20 1553  BP:  (!) 173/76 133/70 (!) 147/81  Pulse: 64 (!) 52 (!) 50 (!) 52  Resp:  20 18 20   Temp:  98.3 F (36.8 C) 98.6 F (37 C) 99 F (37.2 C)  TempSrc:  Oral Oral Oral  SpO2:  99% 99% 100%  Weight:      Height:        Intake/Output Summary (Last 24 hours) at 03/31/2020 1803 Last data filed at 03/31/2020 1432 Gross per 24 hour  Intake --  Output 1200 ml  Net -1200 ml   Filed Weights   03/27/20 1822  Weight: 83.9 kg    Examination: General appearance: Adult female, lying in bed, no acute distress, no confusion. HEENT: Anicteric, conjunctiva pink, lids and lashes normal. No nasal deformity, discharge, epistaxis.  Lips moist.   Skin: Warm and dry.  no jaundice.  No suspicious rashes or lesions. Cardiac: Regular rate and rhythm,  no murmurs, JVP not visible, no lower extremity edema Respiratory: Normal respiratory rate and rhythm, lungs clear to rales wheezes Abdomen: Abdomen soft nontender palpation guarding, no ascites or distention MSK: No deformities or effusions. Neuro: Awake and alert, extraocular movements intact, right-sided weakness, speech fluent    Psych: Sensorium intact respond to questions, touch normal, affect normal, judgment insight appear normal.    Data Reviewed: I have personally reviewed following labs and imaging studies:  CBC: Recent Labs  Lab 03/27/20 1831 03/27/20 1855  WBC 8.3  --   NEUTROABS 4.6  --   HGB 11.6* 13.3  HCT 38.5 39.0  MCV 84.2  --   PLT 328  --    Basic  Metabolic Panel: Recent Labs  Lab 03/27/20 1831 03/27/20 1855 03/28/20 0751 03/28/20 1530 03/29/20 0226 03/29/20 0828  NA 139 142 139 139  --  140  K 3.3* 3.2* 3.3* 3.3*  --  3.9  CL 105 104 105 104  --  108  CO2 25  --  24 24  --  24  GLUCOSE 175* 171* 121* 155*  --  118*  BUN 11 12 16 16   --  14  CREATININE 0.88 0.80 0.82 0.95  --  0.79  CALCIUM 9.1  --  9.3 9.0  --  9.0  MG  --   --  1.8  --  1.7 1.7   GFR: Estimated Creatinine Clearance: 88.7 mL/min (by C-G formula based on SCr of 0.79 mg/dL). Liver Function Tests: Recent Labs  Lab 03/27/20 1831  AST 22  ALT 16  ALKPHOS 58  BILITOT 0.9  PROT 7.9  ALBUMIN 3.1*   No results for input(s): LIPASE, AMYLASE in the last 168 hours. No results for input(s): AMMONIA in the last 168 hours. Coagulation Profile: Recent Labs  Lab 03/27/20 1831  INR 1.2   Cardiac Enzymes: No results for input(s): CKTOTAL, CKMB, CKMBINDEX, TROPONINI in the last 168 hours. BNP (last 3 results) No results for input(s): PROBNP in the last 8760 hours. HbA1C: No results for input(s): HGBA1C in the last 72 hours. CBG: Recent Labs  Lab 03/30/20 1620 03/30/20 2104 03/31/20 0604 03/31/20 1203 03/31/20 1623  GLUCAP 102* 143* 114* 156* 118*   Lipid Profile: No results for input(s): CHOL, HDL, LDLCALC, TRIG, CHOLHDL, LDLDIRECT in the last 72 hours. Thyroid Function Tests: No results for input(s): TSH, T4TOTAL, FREET4, T3FREE, THYROIDAB in the last 72 hours. Anemia Panel: No results for input(s): VITAMINB12, FOLATE, FERRITIN, TIBC, IRON, RETICCTPCT in the last 72 hours. Urine analysis: No results found for: COLORURINE, APPEARANCEUR, LABSPEC, PHURINE, GLUCOSEU, HGBUR, BILIRUBINUR, KETONESUR, PROTEINUR, UROBILINOGEN, NITRITE, LEUKOCYTESUR Sepsis Labs: @LABRCNTIP (procalcitonin:4,lacticacidven:4)  ) Recent Results (from the past 240 hour(s))  Respiratory Panel by RT PCR (Flu A&B, Covid) - Nasopharyngeal Swab     Status: None   Collection  Time: 03/28/20  2:13 AM   Specimen: Nasopharyngeal Swab  Result Value Ref Range Status   SARS Coronavirus 2 by RT PCR NEGATIVE NEGATIVE Final    Comment: (NOTE) SARS-CoV-2 target nucleic acids are NOT DETECTED.  The SARS-CoV-2 RNA is generally detectable in upper respiratoy specimens during the acute phase of infection. The lowest concentration of SARS-CoV-2 viral copies this assay can detect is 131 copies/mL. A negative result does not preclude SARS-Cov-2 infection and should not be used as the sole basis for treatment or other patient management decisions. A negative result may occur with  improper specimen collection/handling, submission of specimen other than nasopharyngeal swab, presence of viral  mutation(s) within the areas targeted by this assay, and inadequate number of viral copies (<131 copies/mL). A negative result must be combined with clinical observations, patient history, and epidemiological information. The expected result is Negative.  Fact Sheet for Patients:  PinkCheek.be  Fact Sheet for Healthcare Providers:  GravelBags.it  This test is no t yet approved or cleared by the [place] FDA and  has been authorized for detection and/or diagnosis of SARS-CoV-2 by FDA under an Emergency Use Authorization (EUA). This EUA will remain  in effect (meaning this test can be used) for the duration of the COVID-19 declaration under Section 564(b)(1) of the Act, 21 U.S.C. section 360bbb-3(b)(1), unless the authorization is terminated or revoked sooner.     Influenza A by PCR NEGATIVE NEGATIVE Final   Influenza B by PCR NEGATIVE NEGATIVE Final    Comment: (NOTE) The Xpert Xpress SARS-CoV-2/FLU/RSV assay is intended as an aid in  the diagnosis of influenza from Nasopharyngeal swab specimens and  should not be used as a sole basis for treatment. Nasal washings and  aspirates are unacceptable for Xpert Xpress  SARS-CoV-2/FLU/RSV  testing.  Fact Sheet for Patients: PinkCheek.be  Fact Sheet for Healthcare Providers: GravelBags.it  This test is not yet approved or cleared by the [place] FDA and  has been authorized for detection and/or diagnosis of SARS-CoV-2 by  FDA under an Emergency Use Authorization (EUA). This EUA will remain  in effect (meaning this test can be used) for the duration of the  Covid-19 declaration under Section 564(b)(1) of the Act, 21  U.S.C. section 360bbb-3(b)(1), unless the authorization is  terminated or revoked. Performed at [facility], [place] [address], [place], [place] [address]          Radiology Studies: No results found.      Scheduled Meds: . amiodarone  200 mg Oral Daily  . anastrozole  1 mg Oral Daily  . apixaban  5 mg Oral BID  . atorvastatin  40 mg Oral Daily  . carvedilol  25 mg Oral BID WC  . sacubitril-valsartan  1 tablet Oral BID   Continuous Infusions:   LOS: 2 days    Time spent: 25 minutes    Edwin Dada, MD [facility] 03/31/2020, 6:03 PM     Please page though Torreon or Epic secure chat:  For [employer], Adult nurse

## 2020-03-31 NOTE — Progress Notes (Signed)
  Speech Language Pathology Treatment: Cognitive-Linquistic  Patient Details Name: Courtney Coleman MRN: [id number] DOB: 01-Apr-1966 Today's Date: 03/31/2020 Time: 0762-2633 SLP Time Calculation (min) (ACUTE ONLY): 22 min  Assessment / Plan / Recommendation Clinical Impression  Pt has made improvements in communication - length of utterance has increased and word retrieval has improved.  Pt able to name objects on picture cards with 100% accuracy; she was able to name category to which items belonged with 70% accuracy. She engaged in divergent naming tasks, generating 7-10 items per category. She was able to describes steps to certain tasks (making coffee, cooking eggs) with only min verbal cues needed to expand length of utterance.  There were very few paraphasias noted.  Pt describes her clarity of speech as 70% of baseline.  She will benefit from ongoing SLP intervention for higher level language.  Her fiance was at bedside and very supportive.   HPI HPI: Courtney Coleman is a 54 y.o. female from the [place], right handed PMHx arrhythmia (xeralto), HTN, CHF and breast cancer (biopsy proven on arimidex) with right sided weakness highly suggestive of stroke. MRI shows Multifocal acute ischemia within the dorsomedial left thalamus and left cerebral peduncle. There is also a small focus in the splenium of the corpus callosum and the left basal ganglia.      SLP Plan  Continue with current plan of care       Recommendations   CIR                Follow up Recommendations: Inpatient Rehab SLP Visit Diagnosis: Aphasia (R47.01) Plan: Continue with current plan of care       GO                Assunta Curtis 03/31/2020, 10:29 AM  Estill Bamberg L. Tivis Ringer, [place] Office number [phone] Pager [phone]

## 2020-03-31 NOTE — Progress Notes (Signed)
Physical Therapy Treatment Patient Details Name: Courtney Coleman MRN: [id number] DOB: 01/17/1966 Today's Date: 03/31/2020    History of Present Illness 54 y.o. female  PMHx arrhythmia (xeralto), HTN, CHF and breast cancer (biopsy proven on arimidex) with right sided weakness and numbness. MRI- Multifocal acute ischemia within the dorsomedial left thalamus and left cerebral peduncle; also small foci in the splenium of the corpus callosum and the left basal ganglia; occlusion Lt posterior cerebral artery. 10/30 pre-syncopal episode with nursing with RLE buckling. +orthostasis 10/31 with PT    PT Comments    Pt very motivated to progress with therapy and works very hard. Mod vc's and additional time given to pt for bed mobility and she was able to come to EOB with min A. Practiced low transfer to L to recliner without AD and min A for support to R side and safety. Ambulated in hallway with L rail with mod support to RLE for prevention of knee buckling and recurvatum and mod A at trunk with gait belt. Pt ambulated 20' took seated rest break and then 40'. Continue to recommend CIR level therapy for maximal return of function. PT will continue to follow.    Follow Up Recommendations  CIR;Supervision/Assistance - 24 hour     Equipment Recommendations  Other (comment) (TBD as progresses)    Recommendations for Other Services Rehab consult     Precautions / Restrictions Precautions Precautions: Fall Restrictions Weight Bearing Restrictions: No    Mobility  Bed Mobility Overal bed mobility: Needs Assistance Bed Mobility: Supine to Sit     Supine to sit: Min assist     General bed mobility comments: mod vc's but only min A with increased time given for pt to problem solve. Min A for initial elevation of trunk  Transfers Overall transfer level: Needs assistance Equipment used: None Transfers: Sit to/from [employer] Sit to Stand: Min assist   Squat pivot transfers: Min  assist     General transfer comment: min A to guard R side for low squat pivot to recliner on L. Min A for Sit<>stand 4x, support needed on R side and at R knee for knee control  Ambulation/Gait Ambulation/Gait assistance: Mod assist;+2 physical assistance Gait Distance (Feet): 60 Feet (20, 40) Assistive device: None (L rail in hallway) Gait Pattern/deviations: Step-through pattern;Decreased dorsiflexion - right;Drifts right/left;Decreased step length - right;Decreased weight shift to right Gait velocity: decr Gait velocity interpretation: <1.31 ft/sec, indicative of household ambulator General Gait Details: mod A R knee for blocking a prevention of recurvatum. Worked on R foot placement and swing through   [employer] Rankin (Stroke Patients Only) Modified Rankin (Stroke Patients Only) Pre-Morbid Rankin Score: No symptoms Modified Rankin: Moderately severe disability     Balance Overall balance assessment: Needs assistance Sitting-balance support: Feet supported;No upper extremity supported Sitting balance-Leahy Scale: Fair     Standing balance support: Bilateral upper extremity supported;During functional activity Standing balance-Leahy Scale: Poor Standing balance comment: reliant on external support                            Cognition Arousal/Alertness: Awake/alert Behavior During Therapy: Flat affect Overall Cognitive Status: Difficult to assess Area of Impairment: Problem solving;Safety/judgement;Attention;Following commands;Awareness                   Current Attention Level: Sustained Memory: Decreased short-term memory  Following Commands: Follows one step commands with increased time Safety/Judgement: Decreased awareness of deficits;Decreased awareness of safety Awareness: Intellectual Problem Solving: Slow processing;Decreased initiation;Requires verbal cues;Difficulty sequencing;Requires  tactile cues General Comments: pt needs cues to self monitor. She is very motivated and wants to keep working but fatigue evident in her movement quality and needs rest breaks for safety      Exercises General Exercises - Lower Extremity Ankle Circles/Pumps: AROM;Both;10 reps;Seated Quad Sets: AROM;Both;10 reps;Seated Other Exercises Other Exercises: pt wanted more hand exercises, gave washcloth squeezing between fingers    General Comments        Pertinent Vitals/Pain Pain Assessment: No/denies pain    Home Living                      Prior Function            PT Goals (current goals can now be found in the care plan section) Acute Rehab PT Goals Patient Stated Goal: return home PT Goal Formulation: With patient Time For Goal Achievement: 04/11/20 Potential to Achieve Goals: Good Progress towards PT goals: Progressing toward goals    Frequency    Min 4X/week      PT Plan Current plan remains appropriate    Co-evaluation              AM-PAC PT "6 Clicks" Mobility   Outcome Measure  Help needed turning from your back to your side while in a flat bed without using bedrails?: None Help needed moving from lying on your back to sitting on the side of a flat bed without using bedrails?: A Little Help needed moving to and from a bed to a chair (including a wheelchair)?: A Lot Help needed standing up from a chair using your arms (e.g., wheelchair or bedside chair)?: A Lot Help needed to walk in hospital room?: A Lot Help needed climbing 3-5 steps with a railing? : Total 6 Click Score: 14    End of Session Equipment Utilized During Treatment: Gait belt Activity Tolerance: Patient tolerated treatment well;Patient limited by fatigue Patient left: with call bell/phone within reach;with family/visitor present;in chair;with chair alarm set Nurse Communication: Mobility status PT Visit Diagnosis: Hemiplegia and hemiparesis;Other abnormalities of gait and  mobility (R26.89) Hemiplegia - Right/Left: Right Hemiplegia - dominant/non-dominant: Dominant Hemiplegia - caused by: Cerebral infarction     Time: 1114-1150 PT Time Calculation (min) (ACUTE ONLY): 36 min  Charges:  $Gait Training: 23-37 mins                     Leighton Roach, Lena  Pager [phone] Office [place] 03/31/2020, 3:03 PM

## 2020-03-31 NOTE — Progress Notes (Signed)
Inpatient Rehab Admissions:  Inpatient Rehab Consult received.  I met with patient and her fiance at the bedside for rehabilitation assessment and to discuss goals and expectations of an inpatient rehab admission.  We discussed goals of supervision to mod I with an estimated length of stay about 2 weeks.  Pt and her fiance are currently staying in town with her niece while she undergoes treatment for breast cancer.  Will need to clarify insurance coverage and need for prior auth.  I do not have a bed available for this patient today.  Will f/u tomorrow.   Shann Medal, PT, DPT Admissions Coordinator [phone] 03/31/20  11:41 AM

## 2020-04-01 ENCOUNTER — Encounter (HOSPITAL_COMMUNITY): Payer: Self-pay | Admitting: Physical Medicine & Rehabilitation

## 2020-04-01 ENCOUNTER — Other Ambulatory Visit: Payer: Self-pay

## 2020-04-01 ENCOUNTER — Inpatient Hospital Stay (HOSPITAL_COMMUNITY)
Admission: RE | Admit: 2020-04-01 | Discharge: 2020-04-22 | DRG: 057 | Disposition: A | Discharge status: Home / Self Care | Payer: No Typology Code available for payment source | Source: Intra-hospital | Attending: Physical Medicine & Rehabilitation | Admitting: Physical Medicine & Rehabilitation

## 2020-04-01 DIAGNOSIS — Z170 Estrogen receptor positive status [ER+]: Secondary | ICD-10-CM

## 2020-04-01 DIAGNOSIS — E1165 Type 2 diabetes mellitus with hyperglycemia: Secondary | ICD-10-CM

## 2020-04-01 DIAGNOSIS — Z7901 Long term (current) use of anticoagulants: Secondary | ICD-10-CM | POA: Diagnosis not present

## 2020-04-01 DIAGNOSIS — I639 Cerebral infarction, unspecified: Secondary | ICD-10-CM | POA: Diagnosis present

## 2020-04-01 DIAGNOSIS — I69312 Visuospatial deficit and spatial neglect following cerebral infarction: Secondary | ICD-10-CM | POA: Diagnosis not present

## 2020-04-01 DIAGNOSIS — I69320 Aphasia following cerebral infarction: Secondary | ICD-10-CM | POA: Diagnosis not present

## 2020-04-01 DIAGNOSIS — I480 Paroxysmal atrial fibrillation: Secondary | ICD-10-CM | POA: Diagnosis present

## 2020-04-01 DIAGNOSIS — R208 Other disturbances of skin sensation: Secondary | ICD-10-CM

## 2020-04-01 DIAGNOSIS — I69390 Apraxia following cerebral infarction: Secondary | ICD-10-CM | POA: Diagnosis not present

## 2020-04-01 DIAGNOSIS — D62 Acute posthemorrhagic anemia: Secondary | ICD-10-CM | POA: Diagnosis present

## 2020-04-01 DIAGNOSIS — I10 Essential (primary) hypertension: Secondary | ICD-10-CM

## 2020-04-01 DIAGNOSIS — C50912 Malignant neoplasm of unspecified site of left female breast: Secondary | ICD-10-CM | POA: Diagnosis present

## 2020-04-01 DIAGNOSIS — I700 Atherosclerosis of aorta: Secondary | ICD-10-CM | POA: Diagnosis present

## 2020-04-01 DIAGNOSIS — I69398 Other sequelae of cerebral infarction: Secondary | ICD-10-CM

## 2020-04-01 DIAGNOSIS — Z7984 Long term (current) use of oral hypoglycemic drugs: Secondary | ICD-10-CM

## 2020-04-01 DIAGNOSIS — I6349 Cerebral infarction due to embolism of other cerebral artery: Secondary | ICD-10-CM | POA: Diagnosis not present

## 2020-04-01 DIAGNOSIS — K5901 Slow transit constipation: Secondary | ICD-10-CM | POA: Diagnosis present

## 2020-04-01 DIAGNOSIS — E785 Hyperlipidemia, unspecified: Secondary | ICD-10-CM | POA: Diagnosis present

## 2020-04-01 DIAGNOSIS — G8191 Hemiplegia, unspecified affecting right dominant side: Secondary | ICD-10-CM

## 2020-04-01 DIAGNOSIS — I951 Orthostatic hypotension: Secondary | ICD-10-CM

## 2020-04-01 DIAGNOSIS — I69351 Hemiplegia and hemiparesis following cerebral infarction affecting right dominant side: Principal | ICD-10-CM

## 2020-04-01 DIAGNOSIS — I5022 Chronic systolic (congestive) heart failure: Secondary | ICD-10-CM | POA: Diagnosis present

## 2020-04-01 DIAGNOSIS — Z79899 Other long term (current) drug therapy: Secondary | ICD-10-CM

## 2020-04-01 DIAGNOSIS — Z8249 Family history of ischemic heart disease and other diseases of the circulatory system: Secondary | ICD-10-CM

## 2020-04-01 DIAGNOSIS — Z6838 Body mass index (BMI) 38.0-38.9, adult: Secondary | ICD-10-CM

## 2020-04-01 DIAGNOSIS — E669 Obesity, unspecified: Secondary | ICD-10-CM | POA: Diagnosis present

## 2020-04-01 DIAGNOSIS — I5032 Chronic diastolic (congestive) heart failure: Secondary | ICD-10-CM | POA: Diagnosis not present

## 2020-04-01 DIAGNOSIS — C50112 Malignant neoplasm of central portion of left female breast: Secondary | ICD-10-CM

## 2020-04-01 DIAGNOSIS — I6381 Other cerebral infarction due to occlusion or stenosis of small artery: Secondary | ICD-10-CM

## 2020-04-01 DIAGNOSIS — R001 Bradycardia, unspecified: Secondary | ICD-10-CM | POA: Diagnosis not present

## 2020-04-01 DIAGNOSIS — R209 Unspecified disturbances of skin sensation: Secondary | ICD-10-CM

## 2020-04-01 DIAGNOSIS — I110 Hypertensive heart disease with heart failure: Secondary | ICD-10-CM | POA: Diagnosis present

## 2020-04-01 DIAGNOSIS — I69392 Facial weakness following cerebral infarction: Secondary | ICD-10-CM

## 2020-04-01 DIAGNOSIS — I63432 Cerebral infarction due to embolism of left posterior cerebral artery: Secondary | ICD-10-CM | POA: Diagnosis not present

## 2020-04-01 DIAGNOSIS — F419 Anxiety disorder, unspecified: Secondary | ICD-10-CM | POA: Diagnosis present

## 2020-04-01 HISTORY — DX: Other cerebral infarction due to occlusion or stenosis of small artery: I63.81

## 2020-04-01 HISTORY — DX: Cerebral infarction, unspecified: I63.9

## 2020-04-01 LAB — GLUCOSE, CAPILLARY
Glucose-Capillary: 127 mg/dL — ABNORMAL HIGH (ref 70–99)
Glucose-Capillary: 157 mg/dL — ABNORMAL HIGH (ref 70–99)
Glucose-Capillary: 131 mg/dL — ABNORMAL HIGH (ref 70–99)
Glucose-Capillary: 126 mg/dL — ABNORMAL HIGH (ref 70–99)

## 2020-04-01 MED ORDER — ACETAMINOPHEN 325 MG PO TABS
325.0000 mg | ORAL_TABLET | ORAL | Status: DC | PRN
  Administered 2020-04-05 – 2020-04-20 (×3): 650 mg via ORAL
  Filled 2020-04-01 (×3): qty 2

## 2020-04-01 MED ORDER — DIPHENHYDRAMINE HCL 12.5 MG/5ML PO ELIX: 12.5000 mg | ORAL_SOLUTION | Freq: Four times a day (QID) | ORAL | Status: DC | PRN

## 2020-04-01 MED ORDER — AMIODARONE HCL 200 MG PO TABS
200.0000 mg | ORAL_TABLET | Freq: Every day | ORAL | Status: DC
  Administered 2020-04-02 – 2020-04-06 (×5): 200 mg via ORAL
  Filled 2020-04-01 (×5): qty 1

## 2020-04-01 MED ORDER — CARVEDILOL 25 MG PO TABS
25.0000 mg | ORAL_TABLET | Freq: Two times a day (BID) | ORAL | Status: DC
  Administered 2020-04-01 – 2020-04-03 (×4): 25 mg via ORAL
  Filled 2020-04-01 (×4): qty 1

## 2020-04-01 MED ORDER — APIXABAN 5 MG PO TABS
5.0000 mg | ORAL_TABLET | Freq: Two times a day (BID) | ORAL | Status: DC
  Administered 2020-04-01 – 2020-04-22 (×42): 5 mg via ORAL
  Filled 2020-04-01 (×42): qty 1

## 2020-04-01 MED ORDER — PROCHLORPERAZINE EDISYLATE 10 MG/2ML IJ SOLN: 5.0000 mg | Freq: Four times a day (QID) | INTRAMUSCULAR | Status: DC | PRN

## 2020-04-01 MED ORDER — TRAZODONE HCL 50 MG PO TABS: 25.0000 mg | ORAL_TABLET | Freq: Every evening | ORAL | Status: DC | PRN

## 2020-04-01 MED ORDER — ATORVASTATIN CALCIUM 40 MG PO TABS
40.0000 mg | ORAL_TABLET | Freq: Every day | ORAL | Status: DC
  Administered 2020-04-02 – 2020-04-22 (×21): 40 mg via ORAL
  Filled 2020-04-01 (×21): qty 1

## 2020-04-01 MED ORDER — POLYETHYLENE GLYCOL 3350 17 G PO PACK: 17.0000 g | PACK | Freq: Every day | ORAL | Status: DC | PRN

## 2020-04-01 MED ORDER — SACUBITRIL-VALSARTAN 24-26 MG PO TABS
1.0000 | ORAL_TABLET | Freq: Two times a day (BID) | ORAL | Status: DC
  Administered 2020-04-01 – 2020-04-06 (×10): 1 via ORAL
  Filled 2020-04-01 (×10): qty 1

## 2020-04-01 MED ORDER — ANASTROZOLE 1 MG PO TABS
1.0000 mg | ORAL_TABLET | Freq: Every day | ORAL | Status: DC
  Administered 2020-04-02 – 2020-04-22 (×21): 1 mg via ORAL
  Filled 2020-04-01 (×21): qty 1

## 2020-04-01 MED ORDER — BISACODYL 10 MG RE SUPP: 10.0000 mg | Freq: Every day | RECTAL | Status: DC | PRN

## 2020-04-01 MED ORDER — GUAIFENESIN-DM 100-10 MG/5ML PO SYRP: 5.0000 mL | ORAL_SOLUTION | Freq: Four times a day (QID) | ORAL | Status: DC | PRN

## 2020-04-01 MED ORDER — FLEET ENEMA 7-19 GM/118ML RE ENEM: 1.0000 | ENEMA | Freq: Once | RECTAL | Status: DC | PRN

## 2020-04-01 MED ORDER — INSULIN ASPART 100 UNIT/ML ~~LOC~~ SOLN
0.0000 [IU] | Freq: Three times a day (TID) | SUBCUTANEOUS | Status: DC
  Administered 2020-04-01 – 2020-04-02 (×2): 1 [IU] via SUBCUTANEOUS
  Administered 2020-04-03 – 2020-04-04 (×2): 2 [IU] via SUBCUTANEOUS
  Administered 2020-04-04 – 2020-04-14 (×10): 1 [IU] via SUBCUTANEOUS
  Administered 2020-04-14: 7 [IU] via SUBCUTANEOUS
  Administered 2020-04-15 – 2020-04-16 (×2): 1 [IU] via SUBCUTANEOUS
  Administered 2020-04-16 – 2020-04-19 (×4): 2 [IU] via SUBCUTANEOUS
  Administered 2020-04-21: 1 [IU] via SUBCUTANEOUS

## 2020-04-01 MED ORDER — ALUM & MAG HYDROXIDE-SIMETH 200-200-20 MG/5ML PO SUSP: 30.0000 mL | ORAL | Status: DC | PRN

## 2020-04-01 MED ORDER — INSULIN ASPART 100 UNIT/ML ~~LOC~~ SOLN: 0.0000 [IU] | Freq: Every day | SUBCUTANEOUS | Status: DC

## 2020-04-01 MED ORDER — PROCHLORPERAZINE MALEATE 5 MG PO TABS: 5.0000 mg | ORAL_TABLET | Freq: Four times a day (QID) | ORAL | Status: DC | PRN

## 2020-04-01 MED ORDER — PROCHLORPERAZINE 25 MG RE SUPP: 12.5000 mg | Freq: Four times a day (QID) | RECTAL | Status: DC | PRN

## 2020-04-01 NOTE — Progress Notes (Signed)
Physical Therapy Treatment Patient Details Name: Courtney Coleman MRN: [id number] DOB: 1966-01-09 Today's Date: 04/01/2020    History of Present Illness 54 y.o. female  PMHx arrhythmia (xeralto), HTN, CHF and breast cancer (biopsy proven on arimidex) with right sided weakness and numbness. MRI- Multifocal acute ischemia within the dorsomedial left thalamus and left cerebral peduncle; also small foci in the splenium of the corpus callosum and the left basal ganglia; occlusion Lt posterior cerebral artery. 10/30 pre-syncopal episode with nursing with RLE buckling. +orthostasis 10/31 with PT    PT Comments    Pt was limited this session in functional mobility by orthostatic BP. She also experienced symptoms of dizziness and headache that impacted her safety with functional mobility this date. Nurse was notified. She was able to transfer to bedside chair towards the R with modA, use of RW, and a R knee block to encourage R LE strengthening and utilization, but did display a LOB resulting in her returning quickly to the chair. She was reclined in the chair to normalize her BP, with success, and she and her fiance were educated on importance of continuing to monitor her symptoms and increase her incline to sitting to improve her BP. Pt and her fiance were educated on HEP of LAQ, SLR, and clamshells. Will continue to follow acutely and recommend CIR to maximize her independence and safety with all functional mobility through addressing her deficits mentioned below.   Follow Up Recommendations  CIR;Supervision/Assistance - 24 hour     Equipment Recommendations  Other (comment) (TBD as progresses)    Recommendations for Other Services Rehab consult     Precautions / Restrictions Precautions Precautions: Fall Precaution Comments: orthostasis Restrictions Weight Bearing Restrictions: No    Mobility  Bed Mobility Overal bed mobility: Needs Assistance Bed Mobility: Supine to Sit     Supine to sit:  Min assist     General bed mobility comments: Cues provided to sequence R LE movement towards EOB as pt was able to bring L off EOB but did not initiate R LE movement until cued. VCs provided to lean laterally onto L UE to ascend trunk, minA to complete and square hips with EOB.  Transfers Overall transfer level: Needs assistance Equipment used: Rolling walker (2 wheeled) Transfers: Sit to/from [employer] Sit to Stand: Mod assist Stand pivot transfers: Mod assist       General transfer comment: R knee block and visual demonstration provided to squence transfers and manage RW. Required repeated cues for proper hand placement on bed prior to transfers. x1 LOB during stand step transfer to R to bedside chair, requiring modA to recover and return to sit in chair safely.  Ambulation/Gait                 Stairs             Wheelchair Mobility    Modified Rankin (Stroke Patients Only) Modified Rankin (Stroke Patients Only) Pre-Morbid Rankin Score: No symptoms Modified Rankin: Moderately severe disability     Balance Overall balance assessment: Needs assistance Sitting-balance support: Feet supported;Bilateral upper extremity supported Sitting balance-Leahy Scale: Fair Sitting balance - Comments: Minor LOB noted with lifting of 1 UE at one instance, but otherwise min guard for safety and no LOB without and with support.   Standing balance support: Bilateral upper extremity supported;During functional activity Standing balance-Leahy Scale: Poor Standing balance comment: B UE support on RW with hand-over-hand assistance for R hand on RW grip, modA and R knee block  for safety as pt would easily become dizzy and lose balance.                            Cognition Arousal/Alertness: Awake/alert Behavior During Therapy: WFL for tasks assessed/performed Overall Cognitive Status: Impaired/Different from baseline Area of Impairment: Following  commands;Memory;Safety/judgement;Problem solving                     Memory: Decreased short-term memory Following Commands: Follows one step commands consistently;Follows one step commands with increased time Safety/Judgement: Decreased awareness of safety;Decreased awareness of deficits   Problem Solving: Slow processing;Decreased initiation;Difficulty sequencing;Requires verbal cues;Requires tactile cues General Comments: A&Ox4. Required repeated cues to remind pt of proper hand placement with transfers, requiring extra time to initiate. Required cues to sequence transition supine > sit EOB.      Exercises      General Comments General comments (skin integrity, edema, etc.): BP during session: 152/71 supine in bed, 164/64 sitting EOB, 148/80 after first ~10 sec standing bout with dizziness reported, 129/75 after stand step transfer with dizziness reported, pt reclined in chair 150/74, 134/67 with head of chair inclined thus pt returned to semi-reclined prior to end of session, nurse notified of all      Pertinent Vitals/Pain Pain Assessment: Faces Faces Pain Scale: Hurts a little bit Pain Location: head from headache after transfer Pain Descriptors / Indicators: Grimacing;Discomfort Pain Intervention(s): Limited activity within patient's tolerance;Monitored during session    Home Living                      Prior Function            PT Goals (current goals can now be found in the care plan section) Acute Rehab PT Goals Patient Stated Goal: to get better and go home to plan wedding PT Goal Formulation: With patient/family Time For Goal Achievement: 04/11/20 Potential to Achieve Goals: Good Progress towards PT goals: Progressing toward goals    Frequency    Min 4X/week      PT Plan Current plan remains appropriate    Co-evaluation              AM-PAC PT "6 Clicks" Mobility   Outcome Measure  Help needed turning from your back to your side  while in a flat bed without using bedrails?: None Help needed moving from lying on your back to sitting on the side of a flat bed without using bedrails?: A Little Help needed moving to and from a bed to a chair (including a wheelchair)?: A Lot Help needed standing up from a chair using your arms (e.g., wheelchair or bedside chair)?: A Lot Help needed to walk in hospital room?: A Lot Help needed climbing 3-5 steps with a railing? : Total 6 Click Score: 14    End of Session Equipment Utilized During Treatment: Gait belt Activity Tolerance: Patient tolerated treatment well;Treatment limited secondary to medical complications (Comment) (orthostatic BP) Patient left: in chair;with call bell/phone within reach;with chair alarm set;with family/visitor present (fiance present) Nurse Communication: Mobility status;Other (comment) (BP readings) PT Visit Diagnosis: Hemiplegia and hemiparesis;Other abnormalities of gait and mobility (R26.89);Unsteadiness on feet (R26.81);Muscle weakness (generalized) (M62.81);Difficulty in walking, not elsewhere classified (R26.2);Other symptoms and signs involving the nervous system (R29.898) Hemiplegia - Right/Left: Right Hemiplegia - dominant/non-dominant: Dominant Hemiplegia - caused by: Cerebral infarction     Time: [id number] PT Time Calculation (min) (ACUTE ONLY): 33 min  Charges:  $Therapeutic Activity: 23-37 mins                     Moishe Spice, PT, DPT Acute Rehabilitation Services  Pager: [phone] Office: [place] 04/01/2020, 11:00 AM

## 2020-04-01 NOTE — Discharge Summary (Signed)
Courtney Coleman, is a 54 y.o. female  DOB [birth year]  MRN [id number].  Admission date:  03/27/2020  Admitting Physician  Rise Patience, MD  Discharge Date:  04/01/2020   Primary MD  Patient, No Pcp Per  Recommendations for primary care physician for things to follow:  -Plan to follow with oncology as an outpatient regarding breast cancer, appointment has been scheduled with Dr. Payton Mccallum on 11/18. -Follow with neurology as an outpatient -Carb modified diet.    Admission Diagnosis  TIA (transient ischemic attack) [G45.9] Hypokalemia [E87.6] Right foot drop [M21.371] Transient right leg weakness [R29.898] CVA (cerebral vascular accident) ([place]) [I63.9]   Discharge Diagnosis  TIA (transient ischemic attack) [G45.9] Hypokalemia [E87.6] Right foot drop [M21.371] Transient right leg weakness [R29.898] CVA (cerebral vascular accident) ([place]) [I63.9]    Active Problems:   Right foot drop   CHF (congestive heart failure) (HCC)   Anemia   PAF (paroxysmal atrial fibrillation) ([place])   Cerebral embolism with cerebral infarction   CVA (cerebral vascular accident) [facility])      Past Medical History:  Diagnosis Date  . Breast cancer ([place])   . CHF (congestive heart failure) ([place])   . Dysrhythmia   . Hypertension     Past Surgical History:  Procedure Laterality Date  . BREAST BIOPSY         History of present illness and  Hospital Course:     Kindly see H&P for history of present illness and admission details, please review complete Labs, Consult reports and Test reports for all details in brief  HPI  from the history and physical done on the day of admission 03/27/2020   HPI: Courtney Coleman is a 54 y.o. female with history of recently diagnosed breast cancer atrial fibrillation status post cardioversion, CHF unknown EF, hypertension presents to the ER because of right foot drop.  Patient states  she had recently moved from [place] to [place] in August and had a breast mass which was biopsied in [place] hospital and was found to be malignant.  During that time patient also was found to have CHF and A. fib.  Patient was cardioverted and placed on amiodarone Coreg and Xarelto.  Patient moved to [facility] for further work-up when patient found that over the last 24 hours patient found it difficult to walk because patient had right foot drop.  Denies any weakness of the lower extremities or difficulty speaking swallowing or any visual symptoms.  Patient appears mildly confused as per the patient's fancy was at the bedside.  ED Course: In the ER patient initially had some weakness in the right foot which resolved.  CT head was unremarkable.  EKG shows normal sinus rhythm.  On-call neurology was consulted and patient admitted for further work-up.  Labs show hemoglobin of 11.6 and potassium of 3.3 blood glucose of 175.  Covid test is negative.  Hospital Course   Stroke MRI brain showed multifocal cortical infarcts.   -Non-invasive angiography showed no significant atherosclerosis -Echocardiogram showed no cardiogenic source of embolism -  Carotid imaging unremarkable   -Lipids ordered: Continue atorvastatin -Aspirin not ordered, already on full dose anticoagulation -Atrial fibrillation: Previously known, transition from Xarelto to apixaban -tPA not given because already on blood thinner, outside the stroke window -PT eval ordered: recommended CIR   Chronic diastolic CHF/hypertension Echocardiogram shows normal -Continue with home medications   Breast cancer -Continue anastrozole -Arrangement has been made to follow with oncology Dr. Payton Mccallum 11/18.  Per lipidemia -Continue with statin on discharge  Diabetes mellitus -A1c is 7.2, continue with education about carb modified diet.  A. fib status post cardioversion -Currently normal sinus rhythm, switched to Eliquis given Xarelto  failure    Discharge Condition:  stable   Follow UP   Follow-up Information    [facility]. Schedule an appointment as soon as possible for a visit in 4 week(s).   Specialty: Neurology Contact information: [address] [place] [phone]                Discharge Instructions  and  Discharge Medications     Discharge Instructions    Ambulatory referral to Cardiology   Complete by: As directed    Ambulatory referral to Hematology / Oncology   Complete by: As directed    Breast cancer   Ambulatory referral to Neurology   Complete by: As directed    Follow up with stroke clinic NP (Courtney Coleman or Courtney Coleman, if both not available, consider Courtney Coleman, or Courtney Coleman) at [facility] in about 4 weeks. Thanks.   Ambulatory referral to Oncology   Complete by: As directed    Diet - low sodium heart healthy   Complete by: As directed    Diet - low sodium heart healthy   Complete by: As directed    Discharge instructions   Complete by: As directed    Management per CIR   Activity: As tolerated with Full fall precautions use walker/cane & assistance as needed   Disposition CIR   Diet: Heart Healthy , with feeding assistance and aspiration precautions.  For Heart failure patients - Check your Weight same time everyday, if you gain over 2 pounds, or you develop in leg swelling, experience more shortness of breath or chest pain, call your Primary MD immediately. Follow Cardiac Low Salt Diet and 1.5 lit/day fluid restriction.   On your next visit with your primary care physician please Get Medicines reviewed and adjusted.   Please request your Prim.MD to go over all Hospital Tests and Procedure/Radiological results at the follow up, please get all Hospital records sent to your Prim MD by signing hospital release before you go home.   If you experience worsening of your admission symptoms, develop shortness  of breath, life threatening emergency, suicidal or homicidal thoughts you must seek medical attention immediately by calling 911 or calling your MD immediately  if symptoms less severe.  You Must read complete instructions/literature along with all the possible adverse reactions/side effects for all the Medicines you take and that have been prescribed to you. Take any new Medicines after you have completely understood and accpet all the possible adverse reactions/side effects.   Do not drive, operating heavy machinery, perform activities at heights, swimming or participation in water activities or provide baby sitting services if your were admitted for syncope or siezures until you have seen by Primary MD or a Neurologist and advised to do so again.  Do not drive when taking Pain medications.    Do not take  more than prescribed Pain, Sleep and Anxiety Medications  Special Instructions: If you have smoked or chewed Tobacco  in the last 2 yrs please stop smoking, stop any regular Alcohol  and or any Recreational drug use.  Wear Seat belts while driving.   Please note  You were cared for by a hospitalist during your hospital stay. If you have any questions about your discharge medications or the care you received while you were in the hospital after you are discharged, you can call the unit and asked to speak with the hospitalist on call if the hospitalist that took care of you is not available. Once you are discharged, your primary care physician will handle any further medical issues. Please note that NO REFILLS for any discharge medications will be authorized once you are discharged, as it is imperative that you return to your primary care physician (or establish a relationship with a primary care physician if you do not have one) for your aftercare needs so that they can reassess your need for medications and monitor your lab values.   Increase activity slowly   Complete by: As directed     Increase activity slowly   Complete by: As directed      Allergies as of 04/01/2020   No Known Allergies     Medication List    STOP taking these medications   rivaroxaban 20 MG Tabs tablet Commonly known as: XARELTO     TAKE these medications   amiodarone 200 MG tablet Commonly known as: PACERONE Take 200 mg by mouth daily.   anastrozole 1 MG tablet Commonly known as: ARIMIDEX Take 1 mg by mouth daily.   apixaban 5 MG Tabs tablet Commonly known as: ELIQUIS Take 1 tablet (5 mg total) by mouth 2 (two) times daily.   atorvastatin 40 MG tablet Commonly known as: LIPITOR Take 1 tablet (40 mg total) by mouth daily.   carvedilol 25 MG tablet Commonly known as: COREG Take 25 mg by mouth 2 (two) times daily with a meal.   Entresto 24-26 MG Generic drug: sacubitril-valsartan Take 1 tablet by mouth 2 (two) times daily.         Diet and Activity recommendation: See Discharge Instructions above   Consults obtained -  Neurology    Major procedures and Radiology Reports - PLEASE review detailed and final reports for all details, in brief -      CT HEAD WO CONTRAST  Result Date: 03/27/2020 CLINICAL DATA:  Cranial neuropathy, altered mental status, vomiting, temporal headache EXAM: CT HEAD WITHOUT CONTRAST TECHNIQUE: Contiguous axial images were obtained from the base of the skull through the vertex without intravenous contrast. COMPARISON:  None. FINDINGS: Brain: Normal anatomic configuration. There is a remote lacunar infarct versus a neuro epithelial cyst within the left caudothalamic groove. Mild to moderate periventricular white matter changes are present possibly reflecting the sequela of small vessel ischemia. No evidence of acute intracranial hemorrhage or infarct. No abnormal mass effect or midline shift. No abnormal intra or extra-axial mass lesion or fluid collection. Ventricular size is normal. The cerebellum is unremarkable. Vascular: No asymmetric hyperdense  vasculature at the skull base. Skull: Intact Sinuses/Orbits: The orbits are unremarkable. Paranasal sinuses are clear. Other: Mastoid air cells and middle ear cavities are clear. IMPRESSION: Chronic microangiopathic changes, slightly advanced than would be typically expected for a patient of this age. Correlation for neuro degenerative risk factors may be helpful for further management. No evidence of acute intracranial hemorrhage or infarct. Electronically  Signed   By: Fidela Salisbury MD   On: 03/27/2020 19:59   MR ANGIO HEAD WO CONTRAST  Result Date: 03/28/2020 CLINICAL DATA:  Stroke follow-up EXAM: MRI HEAD WITHOUT CONTRAST MRA HEAD WITHOUT CONTRAST MRA NECK WITHOUT CONTRAST TECHNIQUE: Multiplanar, multiecho pulse sequences of the brain and surrounding structures were obtained without intravenous contrast. Angiographic images of the Circle of Willis were obtained using MRA technique without intravenous contrast. Angiographic images of the neck were obtained using MRA technique without intravenous contrast. Carotid stenosis measurements (when applicable) are obtained utilizing NASCET criteria, using the distal internal carotid diameter as the denominator. COMPARISON:  None. FINDINGS: MRI HEAD FINDINGS Brain: Multifocal acute ischemia within the dorsomedial left thalamus and left cerebral peduncle. There is also a small focus in the splenium of the corpus callosum and the left basal ganglia. 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 Multifocal hyperintense T2-weighted signal within the white matter. Normal volume of CSF spaces. No chronic microhemorrhage. Normal midline structures. Vascular: Normal flow voids. Skull and upper cervical spine: Normal marrow signal. Sinuses/Orbits: Negative. Other: None. MRA HEAD FINDINGS POSTERIOR CIRCULATION: --Vertebral arteries: Normal --Inferior cerebellar arteries: Normal. --Basilar artery: Normal. --Superior cerebellar arteries: Normal. --Posterior cerebral arteries:  The left posterior cerebral artery is occluded at the P1 2 junction. The right is normal. ANTERIOR CIRCULATION: --Intracranial internal carotid arteries: Normal. --Anterior cerebral arteries (ACA): Normal. --Middle cerebral arteries (MCA): Normal. ANATOMIC VARIANTS: Fetal origin of the right PCA. MRA NECK FINDINGS Carotid and vertebral arteries are normal. IMPRESSION: 1. Multifocal acute ischemia within the dorsomedial left thalamus and left cerebral peduncle. There are also small foci in the splenium of the corpus callosum and the left basal ganglia. 2. 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 3. Otherwise normal MRA of the head and neck. Electronically Signed   By: Ulyses Jarred M.D.   On: 03/28/2020 05:05   MR ANGIO NECK WO CONTRAST  Result Date: 03/28/2020 CLINICAL DATA:  Stroke follow-up EXAM: MRI HEAD WITHOUT CONTRAST MRA HEAD WITHOUT CONTRAST MRA NECK WITHOUT CONTRAST TECHNIQUE: Multiplanar, multiecho pulse sequences of the brain and surrounding structures were obtained without intravenous contrast. Angiographic images of the Circle of Willis were obtained using MRA technique without intravenous contrast. Angiographic images of the neck were obtained using MRA technique without intravenous contrast. Carotid stenosis measurements (when applicable) are obtained utilizing NASCET criteria, using the distal internal carotid diameter as the denominator. COMPARISON:  None. FINDINGS: MRI HEAD FINDINGS Brain: Multifocal acute ischemia within the dorsomedial left thalamus and left cerebral peduncle. There is also a small focus in the splenium of the corpus callosum and the left basal ganglia. 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 Multifocal hyperintense T2-weighted signal within the white matter. Normal volume of CSF spaces. No chronic microhemorrhage. Normal midline structures. Vascular: Normal flow voids. Skull and upper cervical spine: Normal marrow signal. Sinuses/Orbits:  Negative. Other: None. MRA HEAD FINDINGS POSTERIOR CIRCULATION: --Vertebral arteries: Normal --Inferior cerebellar arteries: Normal. --Basilar artery: Normal. --Superior cerebellar arteries: Normal. --Posterior cerebral arteries: The left posterior cerebral artery is occluded at the P1 2 junction. The right is normal. ANTERIOR CIRCULATION: --Intracranial internal carotid arteries: Normal. --Anterior cerebral arteries (ACA): Normal. --Middle cerebral arteries (MCA): Normal. ANATOMIC VARIANTS: Fetal origin of the right PCA. MRA NECK FINDINGS Carotid and vertebral arteries are normal. IMPRESSION: 1. Multifocal acute ischemia within the dorsomedial left thalamus and left cerebral peduncle. There are also small foci in the splenium of the corpus callosum and the left basal ganglia.  2. 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 3. Otherwise normal MRA of the head and neck. Electronically Signed   By: Ulyses Jarred M.D.   On: 03/28/2020 05:05   MR BRAIN WO CONTRAST  Result Date: 03/28/2020 CLINICAL DATA:  Stroke follow-up EXAM: MRI HEAD WITHOUT CONTRAST MRA HEAD WITHOUT CONTRAST MRA NECK WITHOUT CONTRAST TECHNIQUE: Multiplanar, multiecho pulse sequences of the brain and surrounding structures were obtained without intravenous contrast. Angiographic images of the Circle of Willis were obtained using MRA technique without intravenous contrast. Angiographic images of the neck were obtained using MRA technique without intravenous contrast. Carotid stenosis measurements (when applicable) are obtained utilizing NASCET criteria, using the distal internal carotid diameter as the denominator. COMPARISON:  None. FINDINGS: MRI HEAD FINDINGS Brain: Multifocal acute ischemia within the dorsomedial left thalamus and left cerebral peduncle. There is also a small focus in the splenium of the corpus callosum and the left basal ganglia. 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 Multifocal hyperintense  T2-weighted signal within the white matter. Normal volume of CSF spaces. No chronic microhemorrhage. Normal midline structures. Vascular: Normal flow voids. Skull and upper cervical spine: Normal marrow signal. Sinuses/Orbits: Negative. Other: None. MRA HEAD FINDINGS POSTERIOR CIRCULATION: --Vertebral arteries: Normal --Inferior cerebellar arteries: Normal. --Basilar artery: Normal. --Superior cerebellar arteries: Normal. --Posterior cerebral arteries: The left posterior cerebral artery is occluded at the P1 2 junction. The right is normal. ANTERIOR CIRCULATION: --Intracranial internal carotid arteries: Normal. --Anterior cerebral arteries (ACA): Normal. --Middle cerebral arteries (MCA): Normal. ANATOMIC VARIANTS: Fetal origin of the right PCA. MRA NECK FINDINGS Carotid and vertebral arteries are normal. IMPRESSION: 1. Multifocal acute ischemia within the dorsomedial left thalamus and left cerebral peduncle. There are also small foci in the splenium of the corpus callosum and the left basal ganglia. 2. 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 3. Otherwise normal MRA of the head and neck. Electronically Signed   By: Ulyses Jarred M.D.   On: 03/28/2020 05:05   MR BRAIN W CONTRAST  Result Date: 03/28/2020 CLINICAL DATA:  Breast cancer, staging. EXAM: MRI HEAD WITH CONTRAST TECHNIQUE: Multiplanar, multiecho pulse sequences of the brain and surrounding structures were obtained with intravenous contrast. CONTRAST:  28mL GADAVIST GADOBUTROL 1 MMOL/ML IV SOLN COMPARISON:  03/28/2020 CT and MRI head. FINDINGS: Brain: Redemonstration of multifocal acute infarcts, better demonstrated on prior MRI. Chronic lacunar insults involving the bilateral basal ganglia and right thalamus. Remote bilateral centrum semiovale insults. No acute intracranial hemorrhage. No midline shift, ventriculomegaly or extra-axial fluid collection. No mass lesion. No abnormal enhancement. Vascular: Please see recent MRA  head. Skull and upper cervical spine: Normal marrow signal. Sinuses/Orbits: Normal orbits. Pneumatized paranasal sinuses and mastoid air cells. Other: None. IMPRESSION: Redemonstration of multifocal acute and chronic insults, better demonstrated on prior MRI. No abnormal enhancement or mass lesion. Electronically Signed   By: Primitivo Gauze M.D.   On: 03/28/2020 19:29   ECHOCARDIOGRAM COMPLETE  Result Date: 03/28/2020    ECHOCARDIOGRAM REPORT   Patient Name:   [facility] Date of Exam: 03/28/2020 Medical Rec #:  [id number]    Height:       66.0 in Accession #:    [id number]   Weight:       185.0 lb Date of Birth:  1966-01-29   BSA:          1.935 m Patient Age:    73 years     BP:  172/78 mmHg Patient Gender: F            HR:           61 bpm. Exam Location:  Inpatient Procedure: 2D Echo, Cardiac Doppler and Color Doppler Indications:    Stroke 434.91/ I163.9  History:        Patient has no prior history of Echocardiogram examinations.  Sonographer:    Merrie Roof RDCS Referring Phys: [place]  1. Left ventricular ejection fraction, by estimation, is 60 to 65%. The left ventricle has normal function. The left ventricle has no regional wall motion abnormalities. Left ventricular diastolic parameters are consistent with Grade I diastolic dysfunction (impaired relaxation).  2. Right ventricular systolic function is normal. The right ventricular size is normal. There is normal pulmonary artery systolic pressure.  3. Left atrial size was severely dilated.  4. The mitral valve is normal in structure. Trivial mitral valve regurgitation. No evidence of mitral stenosis.  5. The aortic valve is tricuspid. Aortic valve regurgitation is not visualized. Mild to moderate aortic valve sclerosis/calcification is present, without any evidence of aortic stenosis.  6. The inferior vena cava is normal in size with greater than 50% respiratory variability, suggesting right atrial pressure  of 3 mmHg. FINDINGS  Left Ventricle: Left ventricular ejection fraction, by estimation, is 60 to 65%. The left ventricle has normal function. The left ventricle has no regional wall motion abnormalities. The left ventricular internal cavity size was normal in size. There is  no left ventricular hypertrophy. Left ventricular diastolic parameters are consistent with Grade I diastolic dysfunction (impaired relaxation). Normal left ventricular filling pressure. Right Ventricle: The right ventricular size is normal. No increase in right ventricular wall thickness. Right ventricular systolic function is normal. There is normal pulmonary artery systolic pressure. The tricuspid regurgitant velocity is 2.05 m/s, and  with an assumed right atrial pressure of 3 mmHg, the estimated right ventricular systolic pressure is 69.4 mmHg. Left Atrium: Left atrial size was severely dilated. Right Atrium: Right atrial size was normal in size. Pericardium: There is no evidence of pericardial effusion. Mitral Valve: The mitral valve is normal in structure. Mild mitral annular calcification. Trivial mitral valve regurgitation. No evidence of mitral valve stenosis. Tricuspid Valve: The tricuspid valve is normal in structure. Tricuspid valve regurgitation is trivial. No evidence of tricuspid stenosis. Aortic Valve: The aortic valve is tricuspid. Aortic valve regurgitation is not visualized. Mild to moderate aortic valve sclerosis/calcification is present, without any evidence of aortic stenosis. Aortic valve mean gradient measures 6.0 mmHg. Aortic valve peak gradient measures 12.1 mmHg. Aortic valve area, by VTI measures 2.43 cm. Pulmonic Valve: The pulmonic valve was normal in structure. Pulmonic valve regurgitation is trivial. No evidence of pulmonic stenosis. Aorta: The aortic root is normal in size and structure. Venous: The inferior vena cava is normal in size with greater than 50% respiratory variability, suggesting right atrial  pressure of 3 mmHg. IAS/Shunts: No atrial level shunt detected by color flow Doppler.  LEFT VENTRICLE PLAX 2D LVIDd:         4.90 cm      Diastology LVIDs:         3.20 cm      LV e' medial:    3.81 cm/s LV PW:         1.00 cm      LV E/e' medial:  12.8 LV IVS:        1.00 cm      LV  e' lateral:   6.64 cm/s LVOT diam:     2.00 cm      LV E/e' lateral: 7.3 LV SV:         74 LV SV Index:   38 LVOT Area:     3.14 cm  LV Volumes (MOD) LV vol d, MOD A4C: 107.0 ml LV vol s, MOD A4C: 47.1 ml LV SV MOD A4C:     107.0 ml RIGHT VENTRICLE RV Basal diam:  3.60 cm LEFT ATRIUM              Index       RIGHT ATRIUM           Index LA diam:        3.90 cm  2.02 cm/m  RA Area:     17.00 cm LA Vol (A2C):   123.0 ml 63.58 ml/m RA Volume:   46.80 ml  24.19 ml/m LA Vol (A4C):   90.5 ml  46.78 ml/m LA Biplane Vol: 106.0 ml 54.79 ml/m  AORTIC VALVE AV Area (Vmax):    2.33 cm AV Area (Vmean):   2.56 cm AV Area (VTI):     2.43 cm AV Vmax:           174.00 cm/s AV Vmean:          114.000 cm/s AV VTI:            0.303 m AV Peak Grad:      12.1 mmHg AV Mean Grad:      6.0 mmHg LVOT Vmax:         129.00 cm/s LVOT Vmean:        92.800 cm/s LVOT VTI:          0.234 m LVOT/AV VTI ratio: 0.77  AORTA Ao Root diam: 2.90 cm Ao Asc diam:  3.60 cm MITRAL VALVE               TRICUSPID VALVE MV Area (PHT): 3.01 cm    TR Peak grad:   16.8 mmHg MV Decel Time: 252 msec    TR Vmax:        205.00 cm/s MV E velocity: 48.80 cm/s MV A velocity: 74.60 cm/s  SHUNTS MV E/A ratio:  0.65        Systemic VTI:  0.23 m                            Systemic Diam: 2.00 cm Fransico Him MD Electronically signed by Fransico Him MD Signature Date/Time: 03/28/2020/12:02:27 PM    Final     Micro Results     Recent Results (from the past 240 hour(s))  Respiratory Panel by RT PCR (Flu A&B, Covid) - Nasopharyngeal Swab     Status: None   Collection Time: 03/28/20  2:13 AM   Specimen: Nasopharyngeal Swab  Result Value Ref Range Status   SARS Coronavirus 2 by RT  PCR NEGATIVE NEGATIVE Final    Comment: (NOTE) SARS-CoV-2 target nucleic acids are NOT DETECTED.  The SARS-CoV-2 RNA is generally detectable in upper respiratoy specimens during the acute phase of infection. The lowest concentration of SARS-CoV-2 viral copies this assay can detect is 131 copies/mL. A negative result does not preclude SARS-Cov-2 infection and should not be used as the sole basis for treatment or other patient management decisions. A negative result may occur with  improper specimen collection/handling, submission of specimen other than nasopharyngeal swab, presence of  viral mutation(s) within the areas targeted by this assay, and inadequate number of viral copies (<131 copies/mL). A negative result must be combined with clinical observations, patient history, and epidemiological information. The expected result is Negative.  Fact Sheet for Patients:  PinkCheek.be  Fact Sheet for Healthcare Providers:  GravelBags.it  This test is no t yet approved or cleared by the [place] FDA and  has been authorized for detection and/or diagnosis of SARS-CoV-2 by FDA under an Emergency Use Authorization (EUA). This EUA will remain  in effect (meaning this test can be used) for the duration of the COVID-19 declaration under Section 564(b)(1) of the Act, 21 U.S.C. section 360bbb-3(b)(1), unless the authorization is terminated or revoked sooner.     Influenza A by PCR NEGATIVE NEGATIVE Final   Influenza B by PCR NEGATIVE NEGATIVE Final    Comment: (NOTE) The Xpert Xpress SARS-CoV-2/FLU/RSV assay is intended as an aid in  the diagnosis of influenza from Nasopharyngeal swab specimens and  should not be used as a sole basis for treatment. Nasal washings and  aspirates are unacceptable for Xpert Xpress SARS-CoV-2/FLU/RSV  testing.  Fact Sheet for Patients: PinkCheek.be  Fact Sheet for  Healthcare Providers: GravelBags.it  This test is not yet approved or cleared by the [place] FDA and  has been authorized for detection and/or diagnosis of SARS-CoV-2 by  FDA under an Emergency Use Authorization (EUA). This EUA will remain  in effect (meaning this test can be used) for the duration of the  Covid-19 declaration under Section 564(b)(1) of the Act, 21  U.S.C. section 360bbb-3(b)(1), unless the authorization is  terminated or revoked. Performed at [facility], [place] [address], [place], [place] [address]        Today   Subjective:   Koby Hartfield today has no headache,no chest abdominal pain, no shortness of breath, fever or chills.  Objective:   Blood pressure (!) 143/75, pulse (!) 45, temperature 98.5 F (36.9 C), temperature source Oral, resp. rate 18, height 5\' 6"  (1.676 m), weight 83.9 kg, SpO2 99 %.   Intake/Output Summary (Last 24 hours) at 04/01/2020 1310 Last data filed at 04/01/2020 0800 Gross per 24 hour  Intake 240 ml  Output 400 ml  Net -160 ml    Exam Awake Alert, Oriented x 3, Symmetrical Chest wall movement, Good air movement bilaterally, CTAB RRR,No Gallops,Rubs or new Murmurs, No Parasternal Heave +ve B.Sounds, Abd Soft, Non tender,No rebound -guarding or rigidity. No Cyanosis, Clubbing or edema, No new Rash or bruise  Data Review   CBC w Diff:  Lab Results  Component Value Date   WBC 8.3 03/27/2020   HGB 13.3 03/27/2020   HCT 39.0 03/27/2020   PLT 328 03/27/2020   LYMPHOPCT 36 03/27/2020   MONOPCT 6 03/27/2020   EOSPCT 2 03/27/2020   BASOPCT 0 03/27/2020    CMP:  Lab Results  Component Value Date   NA 140 03/29/2020   K 3.9 03/29/2020   CL 108 03/29/2020   CO2 24 03/29/2020   BUN 14 03/29/2020   CREATININE 0.79 03/29/2020   PROT 7.9 03/27/2020   ALBUMIN 3.1 (L) 03/27/2020   BILITOT 0.9 03/27/2020   ALKPHOS 58 03/27/2020   AST 22 03/27/2020   ALT 16 03/27/2020  .   Total  Time in preparing paper work, data evaluation and todays exam - 45 minutes  Phillips Climes M.D on 04/01/2020 at 1:10 PM  [facility]   Office  [phone]

## 2020-04-01 NOTE — H&P (Signed)
Physical Medicine and Rehabilitation Admission H&P    Chief Complaint  Patient presents with  . Functional deficits due to stroke.    HPI: Courtney Coleman is a 54 year old female with history of HTN, breast cancer (dx 12/2019), Afib s/p cardioversion who was admitted on 03/27/2020 with reports of 24 hour history of speech difficutly, mild confusion, and right sided hemiparesis with difficulty walking.  History taken from chart review, husband, and patient.  UDS negative.  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  Echocardiogram with ejection fraction of 60-5%, no wall abnormality, mild to moderate aortic sclerosis and severe left atrial dilatation.  Patient's home Xarelto was changed to Eliquis due to failure of treatment.  Hospital course further complicated by transient worsening of right-sided weakness on 03/28/2020 due to orthostatic hypotension and was treated with IV fluids and bedrest.  Dr. Erlinda Hong felt the stroke was embolic from A. fib and to avoid low blood pressure given left PCA stenosis.  Patient with resultant expressive/receptive aphasia, difficulty with complex command, right inattention, orthostasis with headaches as well as right-sided weakness affecting ADLs and mobility.  CIR was recommended due to functional decline.  Please see preadmission assessment from earlier today as well.  Review of Systems  Constitutional: Positive for malaise/fatigue. Negative for chills and fever.  HENT: Negative for hearing loss and tinnitus.   Eyes: Negative for blurred vision and double vision.  Respiratory: Negative for cough and shortness of breath.   Cardiovascular: Negative for chest pain and palpitations.  Gastrointestinal: Negative for abdominal pain, constipation and heartburn.  Genitourinary: Positive for dysuria (due to discomfort from pure  wick ).  Musculoskeletal: Positive for back pain (due to lying in bed) and joint pain (left shoulder pain for past few months).  Skin: Negative for rash.  Neurological: Positive for dizziness, sensory change, speech change, focal weakness and headaches.  Psychiatric/Behavioral: Negative for memory loss. The patient does not have insomnia.   All other systems reviewed and are negative.  Past Medical History:  Diagnosis Date  . Breast cancer ([place])   . CHF (congestive heart failure) ([place])   . Dysrhythmia   . Hypertension     Past Surgical History:  Procedure Laterality Date  . BREAST BIOPSY      Family History  Problem Relation Age of Onset  . Heart disease Mother   . Diabetes Father   . Cancer Neg Hx    Social History: From home is. Moved to [place] a week PTA. Living with niece (fiancee disabled due to cerebral aneurysm).  reports that she has never smoked. She has never used smokeless tobacco. She reports previous alcohol use. She reports that she does not use drugs.   Allergies: No Known Allergies    Medications Prior to Admission  Medication Sig Dispense Refill  . amiodarone (PACERONE) 200 MG tablet Take 200 mg by mouth daily.    Marland Kitchen anastrozole (ARIMIDEX) 1 MG tablet Take 1 mg by mouth daily.    . carvedilol (COREG) 25 MG tablet Take 25 mg by mouth 2 (two) times daily with a meal.    . rivaroxaban (XARELTO) 20 MG TABS tablet Take 20 mg by mouth daily with supper.    . sacubitril-valsartan (ENTRESTO) 24-26 MG Take 1 tablet by mouth 2 (two) times daily.      Drug Regimen Review  Drug regimen  was reviewed and remains appropriate with no significant issues identified  Home: Home Living Family/patient expects to be discharged to:: Private residence Living Arrangements: Spouse/significant other Available Help at Discharge: Family, Available 24 hours/day Type of Home: House Home Access: Stairs to enter [employer] of Steps: 1 Home Layout: Two level, Bed/bath  upstairs, 1/2 bath on main level, Able to live on main level with bedroom/bathroom Alternate Level Stairs-Number of Steps: 15 Alternate Level Stairs-Rails: Left Bathroom Shower/Tub: Multimedia programmer: Standard Home Equipment: Civil engineer, contracting - built in, [employer] - tub/shower  Lives With: Significant other   Functional History: Prior Function Level of Independence: Independent Comments: not working   Functional Status:  Mobility: Bed Mobility Overal bed mobility: Needs Assistance Bed Mobility: Supine to Sit Supine to sit: Min assist General bed mobility comments: Cues provided to sequence R LE movement towards EOB as pt was able to bring L off EOB but did not initiate R LE movement until cued. VCs provided to lean laterally onto L UE to ascend trunk, minA to complete and square hips with EOB. Transfers Overall transfer level: Needs assistance Equipment used: Rolling walker (2 wheeled) Transfers: Sit to/from Stand, [employer] Transfers Sit to Stand: Mod assist Stand pivot transfers: Mod assist Squat pivot transfers: Min assist General transfer comment: R knee block and visual demonstration provided to squence transfers and manage RW. Required repeated cues for proper hand placement on bed prior to transfers. x1 LOB during stand step transfer to R to bedside chair, requiring modA to recover and return to sit in chair safely. Ambulation/Gait Ambulation/Gait assistance: Mod assist, +2 physical assistance Gait Distance (Feet): 60 Feet (20, 40) Assistive device: None (L rail in hallway) Gait Pattern/deviations: Step-through pattern, Decreased dorsiflexion - right, Drifts right/left, Decreased step length - right, Decreased weight shift to right General Gait Details: mod A R knee for blocking a prevention of recurvatum. Worked on R foot placement and swing through Gait velocity: decr Gait velocity interpretation: <1.31 ft/sec, indicative of household ambulator     ADL: ADL Overall ADL's : Needs assistance/impaired Eating/Feeding: Independent, Sitting Eating/Feeding Details (indicate cue type and reason): family in room helping feed patient due to R UE deficits Grooming: Wash/dry face, Minimal assistance, Standing Grooming Details (indicate cue type and reason): washing face with L hand and R UE on counter for support hand over hand for weight bearing Upper Body Bathing: Set up, Sitting Lower Body Bathing: Minimal assistance, Sit to/from stand, Sitting/lateral leans Upper Body Dressing : Set up, Sitting Lower Body Dressing: Maximal assistance Lower Body Dressing Details (indicate cue type and reason): pt was able to don bil socks with set up assist (did struggle using R hand to pull up at sock), requires min A when sit <> stand to pull up pants due to unsteadiness Toilet Transfer: +2 for physical assistance, Minimal assistance Toileting- Clothing Manipulation and Hygiene: Set up, Sitting/lateral lean, Sit to/from stand Functional mobility during ADLs: +2 for physical assistance, Moderate assistance General ADL Comments: pt with R knee buckling and lack of awareness to fall risk and fatigue in R LE. pt needs cues to stop and reposition R side  Cognition: Cognition Overall Cognitive Status: Impaired/Different from baseline Arousal/Alertness: Awake/alert Orientation Level: Oriented X4 Attention: Sustained, Selective Sustained Attention: Appears intact Selective Attention: Appears intact Memory:  (NT) Awareness: Impaired Awareness Impairment: Emergent impairment Cognition Arousal/Alertness: Awake/alert Behavior During Therapy: WFL for tasks assessed/performed Overall Cognitive Status: Impaired/Different from baseline Area of Impairment: Following commands, Memory, Safety/judgement, Problem solving  Current Attention Level: Sustained Memory: Decreased short-term memory Following Commands: Follows one step commands consistently, Follows one step  commands with increased time Safety/Judgement: Decreased awareness of safety, Decreased awareness of deficits Awareness: Intellectual Problem Solving: Slow processing, Decreased initiation, Difficulty sequencing, Requires verbal cues, Requires tactile cues General Comments: A&Ox4. Required repeated cues to remind pt of proper hand placement with transfers, requiring extra time to initiate. Required cues to sequence transition supine > sit EOB. Difficult to assess due to: Impaired communication   Blood pressure (!) 143/75, pulse (!) 45, temperature 98.5 F (36.9 C), temperature source Oral, resp. rate 18, height 5\' 6"  (1.676 m), weight 83.9 kg, SpO2 99 %. Physical Exam Vitals and nursing note reviewed.  Constitutional:      General: She is not in acute distress.    Appearance: She is obese.  HENT:     Head: Normocephalic and atraumatic.     Right Ear: External ear normal.     Left Ear: External ear normal.     Nose: Nose normal.     Mouth/Throat:     Mouth: Mucous membranes are dry.  Eyes:     General:        Right eye: No discharge.        Left eye: No discharge.     Extraocular Movements: Extraocular movements intact.  Cardiovascular:     Rate and Rhythm: Normal rate and regular rhythm.  Pulmonary:     Effort: Pulmonary effort is normal. No respiratory distress.     Breath sounds: Normal breath sounds. No stridor.  Abdominal:     General: Abdomen is flat. Bowel sounds are normal. There is no distension.  Musculoskeletal:     Cervical back: Normal range of motion and neck supple.     Comments: No edema or tenderness in extremities  Skin:    General: Skin is warm and dry.  Neurological:     Mental Status: She is alert and oriented to person, place, and time.     Comments: Mild right facial weakness.  Able to follow basic commands without difficulty Motor: LUE/LLE: 5/5 proximal distal RUE: 4/5 proximal distal with apraxia RLE: 4-4+/5 proximal distal Sensation diminished to  light touch on right side  Psychiatric:        Mood and Affect: Mood normal.        Behavior: Behavior normal.     Results for orders placed or performed during the hospital encounter of 03/27/20 (from the past 48 hour(s))  Glucose, capillary     Status: Abnormal   Collection Time: 03/30/20  4:20 PM  Result Value Ref Range   Glucose-Capillary 102 (H) 70 - 99 mg/dL    Comment: Glucose reference range applies only to samples taken after fasting for at least 8 hours.  Glucose, capillary     Status: Abnormal   Collection Time: 03/30/20  9:04 PM  Result Value Ref Range   Glucose-Capillary 143 (H) 70 - 99 mg/dL    Comment: Glucose reference range applies only to samples taken after fasting for at least 8 hours.   Comment 1 Notify RN    Comment 2 Document in Chart   Glucose, capillary     Status: Abnormal   Collection Time: 03/31/20  6:04 AM  Result Value Ref Range   Glucose-Capillary 114 (H) 70 - 99 mg/dL    Comment: Glucose reference range applies only to samples taken after fasting for at least 8 hours.   Comment 1 Notify RN  Comment 2 Document in Chart   Glucose, capillary     Status: Abnormal   Collection Time: 03/31/20 12:03 PM  Result Value Ref Range   Glucose-Capillary 156 (H) 70 - 99 mg/dL    Comment: Glucose reference range applies only to samples taken after fasting for at least 8 hours.   Comment 1 Notify RN    Comment 2 Document in Chart   Glucose, capillary     Status: Abnormal   Collection Time: 03/31/20  4:23 PM  Result Value Ref Range   Glucose-Capillary 118 (H) 70 - 99 mg/dL    Comment: Glucose reference range applies only to samples taken after fasting for at least 8 hours.   Comment 1 Notify RN    Comment 2 Document in Chart   Glucose, capillary     Status: Abnormal   Collection Time: 04/01/20  6:24 AM  Result Value Ref Range   Glucose-Capillary 127 (H) 70 - 99 mg/dL    Comment: Glucose reference range applies only to samples taken after fasting for at  least 8 hours.   Comment 1 Notify RN    Comment 2 Document in Chart   Glucose, capillary     Status: Abnormal   Collection Time: 04/01/20 11:17 AM  Result Value Ref Range   Glucose-Capillary 157 (H) 70 - 99 mg/dL    Comment: Glucose reference range applies only to samples taken after fasting for at least 8 hours.   No results found.     Medical Problem List and Plan: 1.  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    -patient may shower  -ELOS/Goals: 12-16 days/supervision/min A.  Admit to CIR 2.  Antithrombotics: -DVT/anticoagulation:  Pharmaceutical: Other (comment)--now on Xarelto.   -antiplatelet therapy: N/A 3. Pain Management: Tylenol prn.  4. Mood: LCSW to follow for evaluation and support.   -antipsychotic agents: N/A 5. Neuropsych: This patient is capable of making decisions on her own behalf. 6. Skin/Wound Care: Routine pressure relief measures.  7. Fluids/Electrolytes/Nutrition: Monitor I/O.    CMP ordered for tomorrow a.m. 8. HTN: Monitor BP tid--now on coreg and Entresto. Avoid LBP due to L-PCA stenosis  Orthostatic vital signs ordered  Monitor increase mobility 9. A fib s/p cardioversion: Monitor HR tid--on coreg bid, amiodarone and Xarelto.   Monitor with increased exertion 10. T2DM with hyperglycemia: Hgb A1c- 7.2. Monitor BS ac/hs. Diabetic education  Monitor with increased mobility. 11. Breast cancer: Arimidex--diagnosed in August but not had follow up due to multiple admissions secondary to cardiac issues. Family has been requesting referral to Hem/onc/surgeon in town--? Referral sent.   Bary Leriche, PA-C 04/01/2020  I have personally performed a face to face diagnostic evaluation, including, but not limited to  relevant history and physical exam findings, of this patient and developed relevant assessment and plan.  Additionally, I have reviewed and concur with the physician assistant's documentation above.  Delice Lesch, MD, ABPMR

## 2020-04-01 NOTE — IPOC Note (Signed)
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[place]) Active Problems:   Acute blood loss anemia   Benign essential HTN     Functional Problem List: Nursing Bladder, Bowel, Endurance, Motor, Sensory, Safety  PT Balance, Endurance, Motor, Sensory, Safety, Perception, Skin Integrity  OT Balance, Cognition, Endurance, Motor, Pain, Perception, Safety, Sensory, Vision  SLP Cognition, Linguistic  TR         Basic ADL's: OT Grooming, Bathing, Dressing, Toileting     Advanced  ADL's: OT       Transfers: PT Bed Mobility, Bed to Chair, Teacher, early years/pre, Tub/Shower     Locomotion: PT Ambulation, Stairs     Additional Impairments: OT    SLP Social Cognition, Communication expression Awareness  TR      Anticipated Outcomes Item Anticipated Outcome  Self Feeding n/a  Swallowing      Basic self-care  supervision  Toileting  supervision   Bathroom Transfers supervison  Bowel/Bladder  Pt will be continent of bladder/bowel with modi assistance  Transfers  CGA with LRAD  Locomotion  CGA with LRAD  Communication  Mod I  Cognition  Mod I  Pain  Pt will manage pain with min assist  Safety/Judgment  Pt will verbalize/follow safety/judgement with min assist   Therapy Plan: PT Intensity: Minimum of 1-2 x/day ,45 to 90 minutes PT Frequency: 5 out of 7 days PT Duration Estimated Length of Stay: 2-3 weeks OT Intensity: Minimum of 1-2 x/day, 45 to 90 minutes OT Frequency: 5 out of 7 days OT Duration/Estimated Length of Stay: 14-16 days SLP Intensity: Minumum of 1-2 x/day, 30 to 90 minutes SLP Frequency: 3 to 5 out of 7 days SLP Duration/Estimated Length of Stay: 2-3 weeks    Team Interventions: Nursing Interventions Patient/Family Education, Bladder Management, Pain Management, Psychosocial Support, Bowel  Management, Medication Management  PT interventions Ambulation/gait training, Training and development officer, Community reintegration, Cognitive remediation/compensation, Discharge planning, Disease management/prevention, Functional electrical stimulation, DME/adaptive equipment instruction, Functional mobility training, Neuromuscular re-education, Patient/family education, Pain management, Psychosocial support, Splinting/orthotics, Therapeutic Activities, UE/LE Strength taining/ROM, Visual/perceptual remediation/compensation, Wheelchair propulsion/positioning, UE/LE Coordination activities, Therapeutic Exercise, Stair training, Skin care/wound management  OT Interventions Balance/vestibular training, Neuromuscular re-education, Self Care/advanced ADL retraining, Therapeutic Exercise, Cognitive remediation/compensation, DME/adaptive equipment instruction, Pain management, Skin care/wound managment, UE/LE Strength taining/ROM, Community reintegration, Barrister's clerk education, Splinting/orthotics, UE/LE Coordination activities  SLP Interventions Cueing hierarchy, Cognitive remediation/compensation, Speech/Language facilitation, Patient/family education  TR Interventions    SW/CM Interventions Discharge Planning, Psychosocial Support, Patient/Family Education   Barriers to Discharge MD  Medical stability and Weight  Nursing Home environment access/layout, Incontinence    PT Inaccessible home environment, Home environment Child psychotherapist, Insurance underwriter for SNF coverage    OT      SLP      SW Home environment access/layout, Other (comments) Full bath upstairs and no insurance to cover medications or appointments   Team Discharge Planning: Destination: PT-Home ,OT- Home , SLP-Home Projected Follow-up: PT-24 hour supervision/assistance, Home health PT, Outpatient PT, OT-  Outpatient OT, 24 hour supervision/assistance, SLP-Outpatient SLP Projected Equipment Needs: PT-To be determined, OT- To be determined,  SLP-None recommended by SLP Equipment Details: PT-Pt owns no DME, OT-  Patient/family involved in discharge planning: PT- Family member/caregiver, Patient,  OT-Patient, Family member/caregiver, SLP-Patient  MD ELOS: 14-17 days. Medical Rehab Prognosis:  Good Assessment: 54 year old female with history of HTN, breast  cancer (dx 12/2019), Afib s/p cardioversion who was admitted on 03/27/2020 with reports of 24 hour history of speech difficutly, mild confusion, and right sided hemiparesis with difficulty walking.  UDS negative.  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  Echocardiogram with ejection fraction of 60-5%, no wall abnormality, mild to moderate aortic sclerosis and severe left atrial dilatation.  Patient's home Xarelto was changed to Eliquis due to failure of treatment.  Hospital course further complicated by transient worsening of right-sided weakness on 03/28/2020 due to orthostatic hypotension and was treated with IV fluids and bedrest.  Dr. Erlinda Hong felt the stroke was embolic from A. fib and to avoid low blood pressure given left PCA stenosis. Patient with resultant expressive/receptive aphasia, difficulty with complex command, right inattention, orthostasis with headaches as well as right-sided weakness affecting ADLs and mobility.  Will set goals for Supervision with PT/OT and Mod I with SLP.   Due to the current state of emergency, patients may not be receiving their 3-hours of Medicare-mandated therapy.  See Team Conference Notes for weekly updates to the plan of care

## 2020-04-01 NOTE — Progress Notes (Signed)
Inpatient Rehabilitation Medication Review by a Pharmacist  A complete drug regimen review was completed for this patient to identify any potential clinically significant medication issues.  Clinically significant medication issues were identified:  no  Check AMION for pharmacist assigned to patient if future medication questions/issues arise during this admission.  Pharmacist comments: Patient continues on Eliquis for afib.  No issues identified.  Time spent performing this drug regimen review (minutes):  5   Ryan Ogborn, Rocky Crafts 04/01/2020 4:36 PM

## 2020-04-01 NOTE — H&P (Addendum)
Physical Medicine and Rehabilitation Admission H&P    Chief Complaint  Patient presents with  . Functional deficits due to stroke.    HPI: Courtney Coleman is a 54 year old female with history of HTN, breast cancer (dx 12/2019), Afib s/p cardioversion who was admitted on 03/27/2020 with reports of 24 hour history of speech difficutly, mild confusion, and right sided hemiparesis with difficulty walking.  History taken from chart review, husband, and patient.  UDS negative.  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  Echocardiogram with ejection fraction of 60-5%, no wall abnormality, mild to moderate aortic sclerosis and severe left atrial dilatation.  Patient's home Xarelto was changed to Eliquis due to failure of treatment.  Hospital course further complicated by transient worsening of right-sided weakness on 03/28/2020 due to orthostatic hypotension and was treated with IV fluids and bedrest.  Dr. Erlinda Hong felt the stroke was embolic from A. fib and to avoid low blood pressure given left PCA stenosis.  Patient with resultant expressive/receptive aphasia, difficulty with complex command, right inattention, orthostasis with headaches as well as right-sided weakness affecting ADLs and mobility.  CIR was recommended due to functional decline.  Please see preadmission assessment from earlier today as well.  Review of Systems  Constitutional: Positive for malaise/fatigue. Negative for chills and fever.  HENT: Negative for hearing loss and tinnitus.   Eyes: Negative for blurred vision and double vision.  Respiratory: Negative for cough and shortness of breath.   Cardiovascular: Negative for chest pain and palpitations.  Gastrointestinal: Negative for abdominal pain, constipation and heartburn.  Genitourinary: Positive for dysuria (due to discomfort from pure  wick ).  Musculoskeletal: Positive for back pain (due to lying in bed) and joint pain (left shoulder pain for past few months).  Skin: Negative for rash.  Neurological: Positive for dizziness, sensory change, speech change, focal weakness and headaches.  Psychiatric/Behavioral: Negative for memory loss. The patient does not have insomnia.   All other systems reviewed and are negative.  Past Medical History:  Diagnosis Date  . Breast cancer ([place])   . CHF (congestive heart failure) ([place])   . Dysrhythmia   . Hypertension     Past Surgical History:  Procedure Laterality Date  . BREAST BIOPSY      Family History  Problem Relation Age of Onset  . Heart disease Mother   . Diabetes Father   . Cancer Neg Hx    Social History: From home is. Moved to [place] a week PTA. Living with niece (fiancee disabled due to cerebral aneurysm).  reports that she has never smoked. She has never used smokeless tobacco. She reports previous alcohol use. She reports that she does not use drugs.   Allergies: No Known Allergies    Medications Prior to Admission  Medication Sig Dispense Refill  . amiodarone (PACERONE) 200 MG tablet Take 200 mg by mouth daily.    Marland Kitchen anastrozole (ARIMIDEX) 1 MG tablet Take 1 mg by mouth daily.    . carvedilol (COREG) 25 MG tablet Take 25 mg by mouth 2 (two) times daily with a meal.    . rivaroxaban (XARELTO) 20 MG TABS tablet Take 20 mg by mouth daily with supper.    . sacubitril-valsartan (ENTRESTO) 24-26 MG Take 1 tablet by mouth 2 (two) times daily.      Drug Regimen Review  Drug regimen  was reviewed and remains appropriate with no significant issues identified  Home: Home Living Family/patient expects to be discharged to:: Private residence Living Arrangements: Spouse/significant other Available Help at Discharge: Family, Available 24 hours/day Type of Home: House Home Access: Stairs to enter [employer] of Steps: 1 Home Layout: Two level, Bed/bath  upstairs, 1/2 bath on main level, Able to live on main level with bedroom/bathroom Alternate Level Stairs-Number of Steps: 15 Alternate Level Stairs-Rails: Left Bathroom Shower/Tub: Multimedia programmer: Standard Home Equipment: Civil engineer, contracting - built in, [employer] - tub/shower  Lives With: Significant other   Functional History: Prior Function Level of Independence: Independent Comments: not working   Functional Status:  Mobility: Bed Mobility Overal bed mobility: Needs Assistance Bed Mobility: Supine to Sit Supine to sit: Min assist General bed mobility comments: Cues provided to sequence R LE movement towards EOB as pt was able to bring L off EOB but did not initiate R LE movement until cued. VCs provided to lean laterally onto L UE to ascend trunk, minA to complete and square hips with EOB. Transfers Overall transfer level: Needs assistance Equipment used: Rolling walker (2 wheeled) Transfers: Sit to/from Stand, [employer] Transfers Sit to Stand: Mod assist Stand pivot transfers: Mod assist Squat pivot transfers: Min assist General transfer comment: R knee block and visual demonstration provided to squence transfers and manage RW. Required repeated cues for proper hand placement on bed prior to transfers. x1 LOB during stand step transfer to R to bedside chair, requiring modA to recover and return to sit in chair safely. Ambulation/Gait Ambulation/Gait assistance: Mod assist, +2 physical assistance Gait Distance (Feet): 60 Feet (20, 40) Assistive device: None (L rail in hallway) Gait Pattern/deviations: Step-through pattern, Decreased dorsiflexion - right, Drifts right/left, Decreased step length - right, Decreased weight shift to right General Gait Details: mod A R knee for blocking a prevention of recurvatum. Worked on R foot placement and swing through Gait velocity: decr Gait velocity interpretation: <1.31 ft/sec, indicative of household ambulator     ADL: ADL Overall ADL's : Needs assistance/impaired Eating/Feeding: Independent, Sitting Eating/Feeding Details (indicate cue type and reason): family in room helping feed patient due to R UE deficits Grooming: Wash/dry face, Minimal assistance, Standing Grooming Details (indicate cue type and reason): washing face with L hand and R UE on counter for support hand over hand for weight bearing Upper Body Bathing: Set up, Sitting Lower Body Bathing: Minimal assistance, Sit to/from stand, Sitting/lateral leans Upper Body Dressing : Set up, Sitting Lower Body Dressing: Maximal assistance Lower Body Dressing Details (indicate cue type and reason): pt was able to don bil socks with set up assist (did struggle using R hand to pull up at sock), requires min A when sit <> stand to pull up pants due to unsteadiness Toilet Transfer: +2 for physical assistance, Minimal assistance Toileting- Clothing Manipulation and Hygiene: Set up, Sitting/lateral lean, Sit to/from stand Functional mobility during ADLs: +2 for physical assistance, Moderate assistance General ADL Comments: pt with R knee buckling and lack of awareness to fall risk and fatigue in R LE. pt needs cues to stop and reposition R side  Cognition: Cognition Overall Cognitive Status: Impaired/Different from baseline Arousal/Alertness: Awake/alert Orientation Level: Oriented X4 Attention: Sustained, Selective Sustained Attention: Appears intact Selective Attention: Appears intact Memory:  (NT) Awareness: Impaired Awareness Impairment: Emergent impairment Cognition Arousal/Alertness: Awake/alert Behavior During Therapy: WFL for tasks assessed/performed Overall Cognitive Status: Impaired/Different from baseline Area of Impairment: Following commands, Memory, Safety/judgement, Problem solving  Current Attention Level: Sustained Memory: Decreased short-term memory Following Commands: Follows one step commands consistently, Follows one step  commands with increased time Safety/Judgement: Decreased awareness of safety, Decreased awareness of deficits Awareness: Intellectual Problem Solving: Slow processing, Decreased initiation, Difficulty sequencing, Requires verbal cues, Requires tactile cues General Comments: A&Ox4. Required repeated cues to remind pt of proper hand placement with transfers, requiring extra time to initiate. Required cues to sequence transition supine > sit EOB. Difficult to assess due to: Impaired communication   Blood pressure (!) 143/75, pulse (!) 45, temperature 98.5 F (36.9 C), temperature source Oral, resp. rate 18, height 5\' 6"  (1.676 m), weight 83.9 kg, SpO2 99 %. Physical Exam Vitals and nursing note reviewed.  Constitutional:      General: She is not in acute distress.    Appearance: She is obese.  HENT:     Head: Normocephalic and atraumatic.     Right Ear: External ear normal.     Left Ear: External ear normal.     Nose: Nose normal.     Mouth/Throat:     Mouth: Mucous membranes are dry.  Eyes:     General:        Right eye: No discharge.        Left eye: No discharge.     Extraocular Movements: Extraocular movements intact.  Cardiovascular:     Rate and Rhythm: Normal rate and regular rhythm.  Pulmonary:     Effort: Pulmonary effort is normal. No respiratory distress.     Breath sounds: Normal breath sounds. No stridor.  Abdominal:     General: Abdomen is flat. Bowel sounds are normal. There is no distension.  Musculoskeletal:     Cervical back: Normal range of motion and neck supple.     Comments: No edema or tenderness in extremities  Skin:    General: Skin is warm and dry.  Neurological:     Mental Status: She is alert and oriented to person, place, and time.     Comments: Mild right facial weakness.  Able to follow basic commands without difficulty Motor: LUE/LLE: 5/5 proximal distal RUE: 4/5 proximal distal with apraxia RLE: 4-4+/5 proximal distal Sensation diminished to  light touch on right side  Psychiatric:        Mood and Affect: Mood normal.        Behavior: Behavior normal.     Results for orders placed or performed during the hospital encounter of 03/27/20 (from the past 48 hour(s))  Glucose, capillary     Status: Abnormal   Collection Time: 03/30/20  4:20 PM  Result Value Ref Range   Glucose-Capillary 102 (H) 70 - 99 mg/dL    Comment: Glucose reference range applies only to samples taken after fasting for at least 8 hours.  Glucose, capillary     Status: Abnormal   Collection Time: 03/30/20  9:04 PM  Result Value Ref Range   Glucose-Capillary 143 (H) 70 - 99 mg/dL    Comment: Glucose reference range applies only to samples taken after fasting for at least 8 hours.   Comment 1 Notify RN    Comment 2 Document in Chart   Glucose, capillary     Status: Abnormal   Collection Time: 03/31/20  6:04 AM  Result Value Ref Range   Glucose-Capillary 114 (H) 70 - 99 mg/dL    Comment: Glucose reference range applies only to samples taken after fasting for at least 8 hours.   Comment 1 Notify RN  Comment 2 Document in Chart   Glucose, capillary     Status: Abnormal   Collection Time: 03/31/20 12:03 PM  Result Value Ref Range   Glucose-Capillary 156 (H) 70 - 99 mg/dL    Comment: Glucose reference range applies only to samples taken after fasting for at least 8 hours.   Comment 1 Notify RN    Comment 2 Document in Chart   Glucose, capillary     Status: Abnormal   Collection Time: 03/31/20  4:23 PM  Result Value Ref Range   Glucose-Capillary 118 (H) 70 - 99 mg/dL    Comment: Glucose reference range applies only to samples taken after fasting for at least 8 hours.   Comment 1 Notify RN    Comment 2 Document in Chart   Glucose, capillary     Status: Abnormal   Collection Time: 04/01/20  6:24 AM  Result Value Ref Range   Glucose-Capillary 127 (H) 70 - 99 mg/dL    Comment: Glucose reference range applies only to samples taken after fasting for at  least 8 hours.   Comment 1 Notify RN    Comment 2 Document in Chart   Glucose, capillary     Status: Abnormal   Collection Time: 04/01/20 11:17 AM  Result Value Ref Range   Glucose-Capillary 157 (H) 70 - 99 mg/dL    Comment: Glucose reference range applies only to samples taken after fasting for at least 8 hours.   No results found.     Medical Problem List and Plan: 1.  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    -patient may shower  -ELOS/Goals: 12-16 days/supervision/min A.  Admit to CIR 2.  Antithrombotics: -DVT/anticoagulation:  Pharmaceutical: Other (comment)--now on Eliquis.   -antiplatelet therapy: N/A 3. Pain Management: Tylenol prn.  4. Mood: LCSW to follow for evaluation and support.   -antipsychotic agents: N/A 5. Neuropsych: This patient is capable of making decisions on her own behalf. 6. Skin/Wound Care: Routine pressure relief measures.  7. Fluids/Electrolytes/Nutrition: Monitor I/O.    CMP ordered for tomorrow a.m. 8. HTN: Monitor BP tid--now on coreg and Entresto. Avoid LBP due to L-PCA stenosis  Orthostatic vital signs ordered  Monitor increase mobility 9. A fib s/p cardioversion: Monitor HR tid--on coreg bid, amiodarone and Xarelto.   Monitor with increased exertion 10. T2DM with hyperglycemia: Hgb A1c- 7.2. Monitor BS ac/hs. Diabetic education  Monitor with increased mobility. 11. Breast cancer: Arimidex--diagnosed in August but not had follow up due to multiple admissions secondary to cardiac issues. Family has been requesting referral to Hem/onc/surgeon in town--? Referral sent.   Bary Leriche, PA-C 04/01/2020  I have personally performed a face to face diagnostic evaluation, including, but not limited to  relevant history and physical exam findings, of this patient and developed relevant assessment and plan.  Additionally, I have reviewed and concur with the physician assistant's documentation above.  Delice Lesch, MD, ABPMR  The patient's status has not changed. Any changes from the pre-admission screening or documentation from the acute chart are noted above.   Delice Lesch, MD, ABPMR

## 2020-04-01 NOTE — TOC Transition Note (Signed)
Transition of Care [facility]) - CM/SW Discharge Note   Patient Details  Name: Courtney Coleman MRN: [id number] Date of Birth: Apr 04, 1966  [facility] CM/SW Contact:  Pollie Friar, RN Phone Number: 04/01/2020, 1:19 PM   Clinical Narrative:    Pt is discharging to CIR today. CM signing off.   Final next level of care: IP Rehab Facility Barriers to Discharge: No Barriers Identified   Patient Goals and CMS Choice        Discharge Placement                       Discharge Plan and Services                                     Social Determinants of Health (SDOH) Interventions     Readmission Risk Interventions No flowsheet data found.

## 2020-04-01 NOTE — Progress Notes (Signed)
Physical Medicine and Rehabilitation Consult     Reason for Consult:Stroke with functional deficits.  Referring Physician: Dr. Loleta Books.      HPI: Courtney Coleman is a 54 y.o. female with history of A. fib s/p cardioversion, CHF, HTN, left breast mass s/p biopsy positive for cancer (12/2019) [place] --started on Arimidex but had issues with heart requiring multiple hospitalization. She moved to [place] a week ago to be closer to niece.  She was admitted on 03/28/2020 with 24-hour history of right foot weakness with difficulty walking and mild confusion.  UDS negative.  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  2D echo showed EF 60 to 65% with no wall abnormality, mild to moderate aortic sclerosis and severe left atrial dilatation.  Dr. Erlinda Hong felt stroke embolic likely from A. Fib--to avoid low BP given L-PCA stenosis.  Patient's home Xarelto was changed to Eliquis.  Therapy evaluations completed today revealing expressive/receptive aphasia with frequent paraphasias and difficulty with complex commands , right inattention, RLE/RUE weakness as well as orthostasis affecting ADLs and mobility.  CIR was recommended due to functional decline.   Patient and fiancee from [facility] here a week ago. Niece has records and reports that she needs to be evaluated by surgical oncologist--has multiple questions regarding follow up/care.     Review of Systems  Constitutional: Negative for chills and fever.  HENT: Negative for hearing loss and tinnitus.   Eyes: Negative for blurred vision and double vision.  Respiratory: Negative for cough and shortness of breath.   Cardiovascular: Negative for chest pain and palpitations.  Gastrointestinal: Negative for constipation, heartburn and nausea.  Genitourinary: Negative for dysuria and urgency.   Musculoskeletal: Positive for joint pain (left shoulder pain--especially at nights). Negative for myalgias.  Skin: Negative for rash.  Neurological: Positive for dizziness, speech change, focal weakness and weakness. Negative for headaches.  Psychiatric/Behavioral: The patient is nervous/anxious.           Past Medical History:  Diagnosis Date  . Breast cancer ([place])    . CHF (congestive heart failure) ([place])    . Dysrhythmia    . Hypertension             Past Surgical History:  Procedure Laterality Date  . BREAST BIOPSY               Family History  Problem Relation Age of Onset  . Cancer Neg Hx        Social History:  Lives with family (niece). Fiance had ruptured aneurysm 2009 but independent. She reports that she has never smoked. She has never used smokeless tobacco. She reports previous alcohol use. She does not use any illicit drugs.       Allergies: No Known Allergies            Medications Prior to Admission  Medication Sig Dispense Refill  . amiodarone (PACERONE) 200 MG tablet Take 200 mg by mouth daily.      Marland Kitchen anastrozole (ARIMIDEX) 1 MG tablet Take 1 mg by mouth daily.      . carvedilol (COREG) 25 MG tablet Take 25 mg by mouth 2 (two) times daily with a meal.      . rivaroxaban (XARELTO) 20 MG TABS tablet Take 20 mg by mouth daily with supper.      . sacubitril-valsartan (  ENTRESTO) 24-26 MG Take 1 tablet by mouth 2 (two) times daily.          Home: Home Living Family/patient expects to be discharged to:: Private residence Living Arrangements: Spouse/significant other Available Help at Discharge: Family, Available 24 hours/day Type of Home: House Home Access: Stairs to enter Technical brewer of Steps: 1 Home Layout: Two level, Bed/bath upstairs, 1/2 bath on main level, Able to live on main level with bedroom/bathroom Alternate Level Stairs-Number of Steps: 15 Alternate Level Stairs-Rails: Left Bathroom Shower/Tub: Multimedia programmer:  Standard Home Equipment: Shower seat - built in, [employer] - tub/shower  Lives With: Significant other  Functional History: Prior Function Level of Independence: Independent Comments: not working  Functional Status:  Mobility: Bed Mobility Overal bed mobility: Needs Assistance Bed Mobility: Supine to Sit Supine to sit: Min assist, +2 for physical assistance General bed mobility comments: assist for trunk and LE management, scooting to EOB. Transfers Overall transfer level: Needs assistance Equipment used: Rolling walker (2 wheeled) Transfers: Sit to/from Stand Sit to Stand: Mod assist, +2 safety/equipment, +2 physical assistance Stand pivot transfers: Mod assist General transfer comment: Mod assist for power up, steadying, R hand placement on RW. Verbal cuing for sequencing, safe hand placement when rising/sitting. STS x2. Ambulation/Gait Ambulation/Gait assistance: Mod assist, +2 physical assistance Gait Distance (Feet): 20 Feet (+15) Assistive device: Rolling walker (2 wheeled) Gait Pattern/deviations: Step-through pattern, Decreased dorsiflexion - right, Drifts right/left, Decreased step length - right, Decreased weight shift to right General Gait Details: Mod +2 for facilitating swing phase, stance phase blocking R knee flexion to prevent buckling but also blocking R knee recurvatum, steadying, placing and holding RUE on RW. Seated rest break x1 after first 15 ft ambulation. Gait velocity: decr   ADL: ADL Overall ADL's : Needs assistance/impaired Eating/Feeding: Independent, Sitting Grooming: Independent, Sitting Upper Body Bathing: Set up, Sitting Lower Body Bathing: Minimal assistance, Sit to/from stand, Sitting/lateral leans Upper Body Dressing : Set up, Sitting Lower Body Dressing: Minimal assistance, Sit to/from stand, Sitting/lateral leans Lower Body Dressing Details (indicate cue type and reason): pt was able to don bil socks with set up assist (did struggle using R  hand to pull up at sock), requires min A when sit <> stand to pull up pants due to unsteadiness Toilet Transfer: Min guard, Minimal assistance, RW, Ambulation, Regular Toilet, Grab bars Toileting- Clothing Manipulation and Hygiene: Set up, Sitting/lateral lean, Sit to/from stand Functional mobility during ADLs: Min guard, Minimal assistance, Cueing for safety, Cueing for sequencing, Rolling walker   Cognition: Cognition Overall Cognitive Status: Difficult to assess Arousal/Alertness: Awake/alert Orientation Level: Oriented to person, Oriented to place Attention: Sustained, Selective Sustained Attention: Appears intact Selective Attention: Appears intact Memory:  (NT) Awareness: Impaired Awareness Impairment: Emergent impairment Cognition Arousal/Alertness: Awake/alert Behavior During Therapy: Flat affect Overall Cognitive Status: Difficult to assess Area of Impairment: Problem solving, Safety/judgement, Attention, Following commands, Awareness Current Attention Level: Sustained Memory: Decreased short-term memory Following Commands: Follows one step commands with increased time Safety/Judgement: Decreased awareness of deficits, Decreased awareness of safety Awareness: Intellectual Problem Solving: Slow processing, Decreased initiation, Requires verbal cues, Difficulty sequencing, Requires tactile cues General Comments: R inattention requiring multimodla cuing to attend to R especially R hand placement on RW. Decreased safety awareness, especially with fatigue. Difficult to assess due to: Impaired communication   Blood pressure (!) 174/82, pulse 68, temperature 98.2 F (36.8 C), temperature source Oral, resp. rate 19, height 5\' 6"  (1.676 m), weight 83.9 kg, SpO2 96 %.  General: Alert and oriented x 3, No apparent distress HEENT: Head is normocephalic, right visual field cut Neck: Supple without JVD or lymphadenopathy Heart: Reg rate and rhythm. No murmurs rubs or gallops Chest: CTA  bilaterally without wheezes, rales, or rhonchi; no distress Abdomen: Soft, non-tender, non-distended, bowel sounds positive. Extremities: No clubbing, cyanosis, or edema. Pulses are 2+ Skin: Clean and intact without signs of breakdown Neuro: Pt is cognitively appropriate with normal insight, memory, and awareness. Cranial nerves 2-12 are intact. Speech clear. Sensation is decreased throughout right side. Able to follow basic commands without difficulty.  +paraphasias 5/5 strength on left side.  RUE: 3/5 throughout RLE: 3/5 HF, KE, 2/5 DF, PF Psych: Pt's affect is appropriate. Pt is cooperative. Great attitude.   Lab Results Last 24 Hours       Results for orders placed or performed during the hospital encounter of 03/27/20 (from the past 24 hour(s))  Glucose, capillary     Status: Abnormal    Collection Time: 03/29/20 12:07 PM  Result Value Ref Range    Glucose-Capillary 126 (H) 70 - 99 mg/dL  Glucose, capillary     Status: Abnormal    Collection Time: 03/29/20  3:17 PM  Result Value Ref Range    Glucose-Capillary 177 (H) 70 - 99 mg/dL  Glucose, capillary     Status: Abnormal    Collection Time: 03/29/20  9:58 PM  Result Value Ref Range    Glucose-Capillary 129 (H) 70 - 99 mg/dL  Glucose, capillary     Status: Abnormal    Collection Time: 03/30/20  6:07 AM  Result Value Ref Range    Glucose-Capillary 118 (H) 70 - 99 mg/dL       Imaging Results (Last 48 hours)  MR BRAIN W CONTRAST   Result Date: 03/28/2020 CLINICAL DATA:  Breast cancer, staging. EXAM: MRI HEAD WITH CONTRAST TECHNIQUE: Multiplanar, multiecho pulse sequences of the brain and surrounding structures were obtained with intravenous contrast. CONTRAST:  77mL GADAVIST GADOBUTROL 1 MMOL/ML IV SOLN COMPARISON:  03/28/2020 CT and MRI head. FINDINGS: Brain: Redemonstration of multifocal acute infarcts, better demonstrated on prior MRI. Chronic lacunar insults involving the bilateral basal ganglia and right thalamus. Remote  bilateral centrum semiovale insults. No acute intracranial hemorrhage. No midline shift, ventriculomegaly or extra-axial fluid collection. No mass lesion. No abnormal enhancement. Vascular: Please see recent MRA head. Skull and upper cervical spine: Normal marrow signal. Sinuses/Orbits: Normal orbits. Pneumatized paranasal sinuses and mastoid air cells. Other: None. IMPRESSION: Redemonstration of multifocal acute and chronic insults, better demonstrated on prior MRI. No abnormal enhancement or mass lesion. Electronically Signed   By: Primitivo Gauze M.D.   On: 03/28/2020 19:29         Assessment/Plan: Diagnosis: Acute left thalamic infarct 1. Does the need for close, 24 hr/day medical supervision in concert with the patient's rehab needs make it unreasonable for this patient to be served in a less intensive setting? Yes 2. Co-Morbidities requiring supervision/potential complications: DM Type 2, hyperlipidemia, atrial fibrillation, breast cancer, HTN, CHF 3. Due to bladder management, bowel management, safety, skin/wound care, disease management, medication administration, pain management and patient education, does the patient require 24 hr/day rehab nursing? Yes 4. Does the patient require coordinated care of a physician, rehab nurse, therapy disciplines of PT, OT to address physical and functional deficits in the context of the above medical diagnosis(es)? Yes Addressing deficits in the following areas: balance, endurance, locomotion, strength, transferring, bowel/bladder control, bathing, dressing, feeding, grooming, toileting and psychosocial support,  cognition, language 5. Can the patient actively participate in an intensive therapy program of at least 3 hrs of therapy per day at least 5 days per week? Yes 6. The potential for patient to make measurable gains while on inpatient rehab is excellent 7. Anticipated functional outcomes upon discharge from inpatient rehab are modified independent   with PT, modified independent with OT, modified independent with SLP. 8. Estimated rehab length of stay to reach the above functional goals is: 10-14 days 9. Anticipated discharge destination: Home 10. Overall Rehab/Functional Prognosis: excellent   RECOMMENDATIONS: This patient's condition is appropriate for continued rehabilitative care in the following setting: CIR Patient has agreed to participate in recommended program. Yes Note that insurance prior authorization may be required for reimbursement for recommended care.   Comment: Thank you for this consult. Admission coordinator to follow.    I have personally performed a face to face diagnostic evaluation, including, but not limited to relevant history and physical exam findings, of this patient and developed relevant assessment and plan.  Additionally, I have reviewed and concur with the physician assistant's documentation above.   Leeroy Cha, MD   Bary Leriche, PA-C 03/30/2020

## 2020-04-01 NOTE — Progress Notes (Signed)
PMR Admission Coordinator Pre-Admission Assessment   Patient: Courtney Coleman is an 54 y.o., female MRN: [id number] DOB: 1965-10-29 Height: 5\' 6"  (167.6 cm) Weight: 83.9 kg                                                                                                                                                  Insurance Information HMO:     PPO:      PCP:      IPA:      80/20:      OTHER:  PRIMARY: Pt is a member of a Health Share cost-sharing plan.  Self pay for CIR purposes.     Plan: [employer] Share      Policy#: [id number]      Subscriber: pt Benefits:  Phone #: [phone]     Name: Courtney Coleman. Date: n/a (awaiting clearance of payment from 11/2)     Deduct:       Out of Pocket Max:       Life Max:   CIR:       SNF:  Outpatient:      Co-Pay:  Home Health:       Co-Pay:  DME:      Co-Pay:  Providers:  SECONDARY:       Policy#:       Phone#:   Development worker, community:       Phone#:    The "Data Collection Information Summary" for patients in Inpatient Rehabilitation Facilities with attached "Privacy Act [place] Records" was provided and verbally reviewed with: N/A   Emergency Contact Information Contact Information       Name Relation Home Work Courtney Coleman, [place] Spouse     [phone]    ?, Courtney Coleman Niece     [phone]         Current Medical History  Patient Admitting Diagnosis: L thalamic CVA    History of Present Illness: Courtney Coleman is a 54 y.o. female with history of A. fib s/p cardioversion, CHF, HTN, left breast mass s/p biopsy positive for cancer (12/2019 [place]) --started on Arimidex but had issues with heart requiring multiple hospitalizations. She moved to [place] a week ago to be closer to niece.  She was admitted on 03/28/2020 with 24-hour history of right foot weakness with difficulty walking and mild confusion.  UDS negative.  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  2D echo showed EF 60 to 65% with no wall abnormality, mild to moderate aortic sclerosis and severe left atrial dilatation.  Dr. Erlinda Hong felt stroke embolic likely from A. Fib--to avoid low BP given L-PCA stenosis.  Patient's home Xarelto was changed to Eliquis.  Therapy evaluations completed today revealing  expressive/receptive aphasia with frequent paraphasias and difficulty with complex commands , right inattention, RLE/RUE weakness as well as orthostasis affecting ADLs and mobility.  CIR was recommended due to functional decline.     Complete NIHSS TOTAL: 5   Past Medical History      Past Medical History:  Diagnosis Date  . Breast cancer ([place])    . CHF (congestive heart failure) ([place])    . Dysrhythmia    . Hypertension        Family History  family history includes Diabetes in her father; Heart disease in her mother.   Prior Rehab/Hospitalizations:  Has the patient had prior rehab or hospitalizations prior to admission? Yes   Has the patient had major surgery during 100 days prior to admission? No   Current Medications    Current Facility-Administered Medications:  .  acetaminophen (TYLENOL) tablet 650 mg, 650 mg, Oral, Q4H PRN, 650 mg at 04/01/20 0328 **OR** acetaminophen (TYLENOL) 160 MG/5ML solution 650 mg, 650 mg, Per Tube, Q4H PRN **OR** acetaminophen (TYLENOL) suppository 650 mg, 650 mg, Rectal, Q4H PRN, Rise Patience, MD .  amiodarone (PACERONE) tablet 200 mg, 200 mg, Oral, Daily, Rise Patience, MD, 200 mg at 04/01/20 1028 .  anastrozole (ARIMIDEX) tablet 1 mg, 1 mg, Oral, Daily, Rise Patience, MD, 1 mg at 04/01/20 1028 .  apixaban (ELIQUIS) tablet 5 mg, 5 mg, Oral, BID, Donnamae Jude, RPH, 5 mg at 04/01/20 1028 .  atorvastatin (LIPITOR) tablet 40 mg, 40 mg, Oral, Daily, Rosalin Hawking, MD, 40 mg at 04/01/20 1028 .  carvedilol (COREG) tablet 25 mg, 25 mg, Oral, BID  WC, Rise Patience, MD, 25 mg at 04/01/20 0804 .  sacubitril-valsartan (ENTRESTO) 24-26 mg per tablet, 1 tablet, Oral, BID, Rise Patience, MD, 1 tablet at 04/01/20 1028   Patients Current Diet:  Diet Order                  Diet - low sodium heart healthy             Diet Heart Room service appropriate? Yes; Fluid consistency: Thin; Fluid restriction: 1200 mL Fluid  Diet effective now                         Precautions / Restrictions Precautions Precautions: Fall Precaution Comments: orthostasis Restrictions Weight Bearing Restrictions: No    Has the patient had 2 or more falls or a fall with injury in the past year?No   Prior Activity Level Community (5-7x/wk): not driving, no DME prior to admit, not working, in the [place] for breast cancer treatment   Prior Functional Level Prior Function Level of Independence: Independent Comments: not working    Self Care: Did the patient need help bathing, dressing, using the toilet or eating?  Independent   Indoor Mobility: Did the patient need assistance with walking from room to room (with or without device)? Independent   Stairs: Did the patient need assistance with internal or external stairs (with or without device)? Independent   Functional Cognition: Did the patient need help planning regular tasks such as shopping or remembering to take medications? Independent   Home Equities trader / Equipment Home Assistive Devices/Equipment: None Home Equipment: Shower seat - built in, Grab bars - tub/shower   Prior Device Use: Indicate devices/aids used by the patient prior to current illness, exacerbation or injury? None of the above   Current Functional Level Cognition   Arousal/Alertness: Awake/alert  Overall Cognitive Status: Impaired/Different from baseline Difficult to assess due to: Impaired communication Current Attention Level: Sustained Orientation Level: Oriented X4 Following Commands: Follows one step  commands consistently, Follows one step commands with increased time Safety/Judgement: Decreased awareness of safety, Decreased awareness of deficits General Comments: A&Ox4. Required repeated cues to remind pt of proper hand placement with transfers, requiring extra time to initiate. Required cues to sequence transition supine > sit EOB. Attention: Sustained, Selective Sustained Attention: Appears intact Selective Attention: Appears intact Memory:  (NT) Awareness: Impaired Awareness Impairment: Emergent impairment    Extremity Assessment (includes Sensation/Coordination)   Upper Extremity Assessment: RUE deficits/detail RUE Deficits / Details: educated on fine motor task with handout provided for medbridge with family in room educated. able to grasp cup with decreased coordination RUE Coordination: decreased fine motor  Lower Extremity Assessment: Defer to PT evaluation RLE Deficits / Details: ankle DF 4/5 RLE Sensation: decreased light touch (75% compared to LLE) RLE Coordination: decreased fine motor (rt shoe slipping off her foot while walking (slip-on))     ADLs   Overall ADL's : Needs assistance/impaired Eating/Feeding: Independent, Sitting Eating/Feeding Details (indicate cue type and reason): family in room helping feed patient due to R UE deficits Grooming: Wash/dry face, Minimal assistance, Standing Grooming Details (indicate cue type and reason): washing face with L hand and R UE on counter for support hand over hand for weight bearing Upper Body Bathing: Set up, Sitting Lower Body Bathing: Minimal assistance, Sit to/from stand, Sitting/lateral leans Upper Body Dressing : Set up, Sitting Lower Body Dressing: Maximal assistance Lower Body Dressing Details (indicate cue type and reason): pt was able to don bil socks with set up assist (did struggle using R hand to pull up at sock), requires min A when sit <> stand to pull up pants due to unsteadiness Toilet Transfer: +2 for  physical assistance, Minimal assistance Toileting- Clothing Manipulation and Hygiene: Set up, Sitting/lateral lean, Sit to/from stand Functional mobility during ADLs: +2 for physical assistance, Moderate assistance General ADL Comments: pt with R knee buckling and lack of awareness to fall risk and fatigue in R LE. pt needs cues to stop and reposition R side     Mobility   Overal bed mobility: Needs Assistance Bed Mobility: Supine to Sit Supine to sit: Min assist General bed mobility comments: Cues provided to sequence R LE movement towards EOB as pt was able to bring L off EOB but did not initiate R LE movement until cued. VCs provided to lean laterally onto L UE to ascend trunk, minA to complete and square hips with EOB.     Transfers   Overall transfer level: Needs assistance Equipment used: Rolling walker (2 wheeled) Transfers: Sit to/from Stand, [employer] Transfers Sit to Stand: Mod assist Stand pivot transfers: Mod assist Squat pivot transfers: Min assist General transfer comment: R knee block and visual demonstration provided to squence transfers and manage RW. Required repeated cues for proper hand placement on bed prior to transfers. x1 LOB during stand step transfer to R to bedside chair, requiring modA to recover and return to sit in chair safely.     Ambulation / Gait / Stairs / Wheelchair Mobility   Ambulation/Gait Ambulation/Gait assistance: Mod assist, +2 physical assistance Gait Distance (Feet): 60 Feet (20, 40) Assistive device: None (L rail in hallway) Gait Pattern/deviations: Step-through pattern, Decreased dorsiflexion - right, Drifts right/left, Decreased step length - right, Decreased weight shift to right General Gait Details: mod A R knee for blocking  a prevention of recurvatum. Worked on R foot placement and swing through Gait velocity: decr Gait velocity interpretation: <1.31 ft/sec, indicative of household ambulator     Posture / Balance Dynamic Sitting  Balance Sitting balance - Comments: Minor LOB noted with lifting of 1 UE at one instance, but otherwise min guard for safety and no LOB without and with support. Balance Overall balance assessment: Needs assistance Sitting-balance support: Feet supported, Bilateral upper extremity supported Sitting balance-Leahy Scale: Fair Sitting balance - Comments: Minor LOB noted with lifting of 1 UE at one instance, but otherwise min guard for safety and no LOB without and with support. Standing balance support: Bilateral upper extremity supported, During functional activity Standing balance-Leahy Scale: Poor Standing balance comment: B UE support on RW with hand-over-hand assistance for R hand on RW grip, modA and R knee block for safety as pt would easily become dizzy and lose balance.     Special needs/care consideration Special service needs oncology f/u and Designated visitor [place] (from acute therapy documentation) Living Arrangements: Spouse/significant other  Lives With: Significant other Available Help at Discharge: Family, Available 24 hours/day Type of Home: House Home Layout: Two level, Bed/bath upstairs, 1/2 bath on main level, Able to live on main level with bedroom/bathroom Alternate Level Stairs-Rails: Left Alternate Level Stairs-Number of Steps: 15 Home Access: Stairs to enter [employer] of Steps: 1 Bathroom Shower/Tub: Multimedia programmer: [place]: No   Discharge Living Setting Plans for Discharge Living Setting: Lives with (comment) (fiance and neice) Type of Home at Discharge: House Discharge Home Layout: Two level, 1/2 bath on main level Alternate Level Stairs-Rails: Left Alternate Level Stairs-Number of Steps: 15 Discharge Home Access: Stairs to enter Entrance Stairs-Rails: None Entrance Stairs-Number of Steps: 1 Discharge Bathroom Shower/Tub: Walk-in shower Discharge Bathroom Toilet:  Standard Discharge Bathroom Accessibility: Yes How Accessible: Accessible via walker Does the patient have any problems obtaining your medications?: Yes (Describe) (health cost sharing plan, not insured)   Social/Family/Support Systems Patient Roles: Partner Contact Information: Moss Mc (fiance) [phone] Anticipated Caregiver: Courtney Coleman (neice) and Carver Fila Anticipated Caregiver's Contact Information: Courtney Coleman [phone] Ability/Limitations of Caregiver: n/a Caregiver Availability: 24/7 Discharge Plan Discussed with Primary Caregiver: Yes Is Caregiver In Agreement with Plan?: Yes Does Caregiver/Family have Issues with Lodging/Transportation while Pt is in Rehab?: No     Goals Patient/Family Goal for Rehab: PT/OT/SLP supervision to mod I Expected length of stay: 13-16 days Cultural Considerations: from the [place] Pt/Family Agrees to Admission and willing to participate: Yes Program Orientation Provided & Reviewed with Pt/Caregiver Including Roles  & Responsibilities: Yes  Barriers to Discharge: Insurance for SNF coverage     Decrease burden of Care through IP rehab admission: n/a     Possible need for SNF placement upon discharge:Not anticipated     Patient Condition: This patient's medical and functional status has changed since the consult dated: 03/30/2020 in which the Rehabilitation Physician determined and documented that the patient's condition is appropriate for intensive rehabilitative care in an inpatient rehabilitation facility. See "History of Present Illness" (above) for medical update. Functional changes are: min to mod assist for up to 40', limited by dizziness on 11/3. Patient's medical and functional status update has been discussed with the Rehabilitation physician and patient remains appropriate for inpatient rehabilitation. Will admit to inpatient rehab today.   Preadmission Screen Completed By:  Michel Santee, PT, DPT 04/01/2020 11:25  AM ______________________________________________________________________  Discussed status with Dr. Posey Pronto on 04/01/20 at 11:29 AM  and received approval for admission today.   Admission Coordinator:  Michel Santee, PT, DPT time 11:29 AM Sudie Grumbling 04/01/20

## 2020-04-01 NOTE — Plan of Care (Signed)
  Problem: RH BLADDER ELIMINATION Goal: RH STG MANAGE BLADDER WITH ASSISTANCE Description: STG Manage Bladder With modi Assistance Outcome: Progressing   Problem: Consults Goal: Skin Care Protocol Initiated - if Braden Score 18 or less Description: If consults are not indicated, leave blank or document N/A Outcome: Progressing

## 2020-04-01 NOTE — Progress Notes (Signed)
Inpatient Rehabilitation  Patient information reviewed and entered into eRehab system by Nixxon Faria M. Taylore Hinde, M.A., CCC/SLP, PPS Coordinator.  Information including medical coding, functional ability and quality indicators will be reviewed and updated through discharge.    

## 2020-04-01 NOTE — Evaluation (Signed)
Speech Language Pathology Assessment and Plan  Patient Details  Name: Courtney Coleman MRN: [id number] Date of Birth: 07-Mar-1966  SLP Diagnosis: Aphasia;Cognitive Impairments  Rehab Potential: Excellent ELOS: 2-3 weeks    Today's Date: 04/02/2020 SLP Individual Time: 7782-4235 SLP Individual Time Calculation (min): 38 min   Hospital Problem: Active Problems:   Thalamic stroke [facility])  Past Medical History:  Past Medical History:  Diagnosis Date   Breast cancer ([place])    CHF (congestive heart failure) ([place])    Dysrhythmia    Hypertension    Past Surgical History:  Past Surgical History:  Procedure Laterality Date   BREAST BIOPSY      Assessment / Plan / Recommendation Clinical Impression   HPI: Courtney Coleman is a 54 year old female with history of HTN, breast cancer (dx 12/2019), Afib s/p cardioversion who was admitted on 03/27/2020 with reports of 24 hour history of speech difficutly, mild confusion, and right sided hemiparesis with difficulty walking.  History taken from chart review, husband, and patient.  UDS negative.  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  Echocardiogram with ejection fraction of 60-5%, no wall abnormality, mild to moderate aortic sclerosis and severe left atrial dilatation.  Patient's home Xarelto was changed to Eliquis due to failure of treatment.  Hospital course further complicated by transient worsening of right-sided weakness on 03/28/2020 due to orthostatic hypotension and was treated with IV fluids and bedrest.  Dr. Erlinda Hong felt the stroke was embolic from A. fib and to avoid low blood pressure given left PCA stenosis. Patient with resultant expressive/receptive aphasia, difficulty with complex command, right inattention, orthostasis with headaches as well as right-sided weakness affecting ADLs and mobility.   Pt was admitted to CIR 04/01/20 and SLP evaluation administered 04/02/20 with results as follows:  Pt presents with a mild expressive aphasia; receptive deficits seem to have largely resolved at this point. She followed 1, 2, and 3 step directions with 1 cue for interpreting complex language, otherwise WFL. Pt's speech is mostly fluent and intelligible, however occasional word finding noted in conversation, divergent naming, and confrontation naming tasks. She also exhibited some spelling errors when writing at the sentence level, but able to correct with dictation cues. Pt also demonstrated reading skills WFL at paragraph level. Right visual inattention noted, but overall cognitive function WFL with the exception of emergent awareness, although it does appear to have improved since admission to hospital, per chart review.   Recommend pt receive skilled ST while inpatient to address expressive aphasia and emergent awareness deficits in order to maximize her functional communication, independence, and safety prior to discharge.    Skilled Therapeutic Interventions          Cognitive-linguistic evaluations was administered and results were reviewed with pt (please see above for details regarding results).   SLP Assessment  Patient will need skilled [facility] during CIR admission    Recommendations  Patient destination: Home Follow up Recommendations: Outpatient SLP Equipment Recommended: None recommended by SLP    SLP Frequency 3 to 5 out of 7 days   SLP Duration  SLP Intensity  SLP Treatment/Interventions 2-3 weeks  Minumum of 1-2 x/day, 30 to 90 minutes  Cueing hierarchy;Cognitive remediation/compensation;Speech/Language facilitation;Patient/family education    Pain Pain Assessment Pain Scale: 0-10 Pain Score: 0-No pain     SLP Evaluation Cognition Overall Cognitive Status: Within  Functional Limits for tasks assessed Awareness: Impaired Awareness  Impairment: Emergent impairment Safety/Judgment: Appears intact  Comprehension Auditory Comprehension Overall Auditory Comprehension: Appears within functional limits for tasks assessed Yes/No Questions: Within Functional Limits Commands: Within Functional Limits Interfering Components: Visual impairments Visual Recognition/Discrimination Discrimination: Within Function Limits Reading Comprehension Reading Status: Within funtional limits Paragraph Level: Within functional limits Expression Expression Primary Mode of Expression: Verbal Verbal Expression Overall Verbal Expression: Impaired Initiation: No impairment Level of Generative/Spontaneous Verbalization: Sentence Repetition: No impairment Naming: Impairment Responsive: 76-100% accurate Confrontation: Impaired Convergent: 75-100% accurate Divergent: 75-100% accurate Verbal Errors: Other (comment) (intermittent awareness of errors) Pragmatics: No impairment Non-Verbal Means of Communication: Not applicable Written Expression Written Expression: Exceptions to [facility] Dictation Ability: Phrase Self Formulation Ability: Phrase Oral Motor Oral Motor/Sensory Function Overall Oral Motor/Sensory Function: Mild impairment Facial ROM: Reduced right Facial Symmetry: Abnormal symmetry right Facial Strength: Reduced right Facial Sensation: Reduced right Lingual ROM: Within Functional Limits Lingual Symmetry: Within Functional Limits Lingual Strength: Within Functional Limits Velum: Within Functional Limits Mandible: Within Functional Limits Motor Speech Overall Motor Speech: Appears within functional limits for tasks assessed Intelligibility: Intelligible Motor Planning: Witnin functional limits Motor Speech Errors: Not applicable  Care Tool Care Tool Cognition Expression of Ideas and Wants Expression of Ideas and Wants: Some difficulty - exhibits some difficulty with expressing needs and ideas (e.g, some words or finishing  thoughts) or speech is not clear   Understanding Verbal and Non-Verbal Content Understanding Verbal and Non-Verbal Content: Understands (complex and basic) - clear comprehension without cues or repetitions   Memory/Recall Ability *first 3 days only Memory/Recall Ability *first 3 days only: Current season;That he or she is in a hospital/hospital unit      Intelligibility: Intelligible   Short Term Goals: Week 1: SLP Short Term Goal 1 (Week 1): Pt will use word finding strategies during structured speech tasks with Supervision A cues SLP Short Term Goal 2 (Week 1): Pt will use word finding strategies in informal/unstructured conversation with mildly complex language with Min A cues SLP Short Term Goal 3 (Week 1): Pt will write at the sentence level with 80% accuracy and Supervision A level cues for correcting spelling errors SLP Short Term Goal 4 (Week 1): Pt will identify verbal and/or written errors with Min A cues  Refer to Care Plan for Long Term Goals  Recommendations for other services: None   Discharge Criteria: Patient will be discharged from SLP if patient refuses treatment 3 consecutive times without medical reason, if treatment goals not met, if there is a change in medical status, if patient makes no progress towards goals or if patient is discharged from hospital.  The above assessment, treatment plan, treatment alternatives and goals were discussed and mutually agreed upon: by patient  Arbutus Leas 04/02/2020, 12:30 PM

## 2020-04-01 NOTE — PMR Pre-admission (Addendum)
PMR Admission Coordinator Pre-Admission Assessment  Patient: Courtney Coleman is an 54 y.o., female MRN: [id number] DOB: March 16, 1966 Height: 5\' 6"  (167.6 cm) Weight: 83.9 kg              Insurance Information HMO:     PPO:      PCP:      IPA:      80/20:      OTHER:  PRIMARY: Pt is a member of a Health Share cost-sharing plan.  Self pay for CIR purposes.    Plan: [employer] Share      Policy#: [id number]      Subscriber: pt Benefits:  Phone #: [phone]     Name: Courtney Coleman. Date: n/a (awaiting clearance of payment from 11/2)     Deduct:       Out of Pocket Max:       Life Max:   CIR:       SNF:  Outpatient:      Co-Pay:  Home Health:       Co-Pay:  DME:      Co-Pay:  Providers:  SECONDARY:       Policy#:       Phone#:  Development worker, community:       Phone#:   The "Data Collection Information Summary" for patients in Inpatient Rehabilitation Facilities with attached "Privacy Act [place] Records" was provided and verbally reviewed with: N/A  Emergency Contact Information Contact Information     Name Relation Home Work [place], [place] Spouse   [phone]   ?, Courtney Coleman Niece   [phone]      Current Medical History  Patient Admitting Diagnosis: L thalamic CVA   History of Present Illness: Courtney Coleman is a 54 y.o. female with history of A. fib s/p cardioversion, CHF, HTN, left breast mass s/p biopsy positive for cancer (12/2019 [place]) --started on Arimidex but had issues with heart requiring multiple hospitalizations. She moved to [place] a week ago to be closer to niece.  She was admitted on 03/28/2020 with 24-hour history of right foot weakness with difficulty walking and mild confusion.  UDS negative.  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  2D echo showed EF 60 to 65% with no wall abnormality,  mild to moderate aortic sclerosis and severe left atrial dilatation.  Dr. Erlinda Hong felt stroke embolic likely from A. Fib--to avoid low BP given L-PCA stenosis.  Patient's home Xarelto was changed to Eliquis.  Therapy evaluations completed today revealing expressive/receptive aphasia with frequent paraphasias and difficulty with complex commands , right inattention, RLE/RUE weakness as well as orthostasis affecting ADLs and mobility.  CIR was recommended due to functional decline.    Complete NIHSS TOTAL: 5  Past Medical History  Past Medical History:  Diagnosis Date   Breast cancer ([place])    CHF (congestive heart failure) (HCC)    Dysrhythmia    Hypertension     Family History  family history includes Diabetes in her father; Heart disease in her mother.  Prior Rehab/Hospitalizations:  Has the patient had prior rehab or hospitalizations prior to admission? Yes  Has the patient had major surgery during 100 days prior to admission? No  Current Medications   Current Facility-Administered Medications:    acetaminophen (TYLENOL) tablet 650 mg, 650 mg, Oral, Q4H PRN, 650 mg at 04/01/20 0328 **OR** acetaminophen (TYLENOL) 160  MG/5ML solution 650 mg, 650 mg, Per Tube, Q4H PRN **OR** acetaminophen (TYLENOL) suppository 650 mg, 650 mg, Rectal, Q4H PRN, Rise Patience, MD   amiodarone (PACERONE) tablet 200 mg, 200 mg, Oral, Daily, Hal Hope, Arshad N, MD, 200 mg at 04/01/20 1028   anastrozole (ARIMIDEX) tablet 1 mg, 1 mg, Oral, Daily, Gean Birchwood N, MD, 1 mg at 04/01/20 1028   apixaban (ELIQUIS) tablet 5 mg, 5 mg, Oral, BID, Donnamae Jude, RPH, 5 mg at 04/01/20 1028   atorvastatin (LIPITOR) tablet 40 mg, 40 mg, Oral, Daily, Rosalin Hawking, MD, 40 mg at 04/01/20 1028   carvedilol (COREG) tablet 25 mg, 25 mg, Oral, BID WC, Rise Patience, MD, 25 mg at 04/01/20 0804   sacubitril-valsartan (ENTRESTO) 24-26 mg per tablet, 1 tablet, Oral, BID, Rise Patience, MD, 1 tablet at 04/01/20  1028  Patients Current Diet:  Diet Order             Diet - low sodium heart healthy           Diet Heart Room service appropriate? Yes; Fluid consistency: Thin; Fluid restriction: 1200 mL Fluid  Diet effective now                   Precautions / Restrictions Precautions Precautions: Fall Precaution Comments: orthostasis Restrictions Weight Bearing Restrictions: No   Has the patient had 2 or more falls or a fall with injury in the past year?No  Prior Activity Level Community (5-7x/wk): not driving, no DME prior to admit, not working, in the [place] for breast cancer treatment  Prior Functional Level Prior Function Level of Independence: Independent Comments: not working   Self Care: Did the patient need help bathing, dressing, using the toilet or eating?  Independent  Indoor Mobility: Did the patient need assistance with walking from room to room (with or without device)? Independent  Stairs: Did the patient need assistance with internal or external stairs (with or without device)? Independent  Functional Cognition: Did the patient need help planning regular tasks such as shopping or remembering to take medications? Independent  Home Equities trader / Equipment Home Assistive Devices/Equipment: None Home Equipment: Shower seat - built in, Grab bars - tub/shower  Prior Device Use: Indicate devices/aids used by the patient prior to current illness, exacerbation or injury? None of the above  Current Functional Level Cognition  Arousal/Alertness: Awake/alert Overall Cognitive Status: Impaired/Different from baseline Difficult to assess due to: Impaired communication Current Attention Level: Sustained Orientation Level: Oriented X4 Following Commands: Follows one step commands consistently, Follows one step commands with increased time Safety/Judgement: Decreased awareness of safety, Decreased awareness of deficits General Comments: A&Ox4. Required repeated cues to  remind pt of proper hand placement with transfers, requiring extra time to initiate. Required cues to sequence transition supine > sit EOB. Attention: Sustained, Selective Sustained Attention: Appears intact Selective Attention: Appears intact Memory:  (NT) Awareness: Impaired Awareness Impairment: Emergent impairment    Extremity Assessment (includes Sensation/Coordination)  Upper Extremity Assessment: RUE deficits/detail RUE Deficits / Details: educated on fine motor task with handout provided for medbridge with family in room educated. able to grasp cup with decreased coordination RUE Coordination: decreased fine motor  Lower Extremity Assessment: Defer to PT evaluation RLE Deficits / Details: ankle DF 4/5 RLE Sensation: decreased light touch (75% compared to LLE) RLE Coordination: decreased fine motor (rt shoe slipping off her foot while walking (slip-on))    ADLs  Overall ADL's : Needs assistance/impaired Eating/Feeding: Independent, Sitting Eating/Feeding  Details (indicate cue type and reason): family in room helping feed patient due to R UE deficits Grooming: Wash/dry face, Minimal assistance, Standing Grooming Details (indicate cue type and reason): washing face with L hand and R UE on counter for support hand over hand for weight bearing Upper Body Bathing: Set up, Sitting Lower Body Bathing: Minimal assistance, Sit to/from stand, Sitting/lateral leans Upper Body Dressing : Set up, Sitting Lower Body Dressing: Maximal assistance Lower Body Dressing Details (indicate cue type and reason): pt was able to don bil socks with set up assist (did struggle using R hand to pull up at sock), requires min A when sit <> stand to pull up pants due to unsteadiness Toilet Transfer: +2 for physical assistance, Minimal assistance Toileting- Clothing Manipulation and Hygiene: Set up, Sitting/lateral lean, Sit to/from stand Functional mobility during ADLs: +2 for physical assistance, Moderate  assistance General ADL Comments: pt with R knee buckling and lack of awareness to fall risk and fatigue in R LE. pt needs cues to stop and reposition R side    Mobility  Overal bed mobility: Needs Assistance Bed Mobility: Supine to Sit Supine to sit: Min assist General bed mobility comments: Cues provided to sequence R LE movement towards EOB as pt was able to bring L off EOB but did not initiate R LE movement until cued. VCs provided to lean laterally onto L UE to ascend trunk, minA to complete and square hips with EOB.    Transfers  Overall transfer level: Needs assistance Equipment used: Rolling walker (2 wheeled) Transfers: Sit to/from Stand, [employer] Transfers Sit to Stand: Mod assist Stand pivot transfers: Mod assist Squat pivot transfers: Min assist General transfer comment: R knee block and visual demonstration provided to squence transfers and manage RW. Required repeated cues for proper hand placement on bed prior to transfers. x1 LOB during stand step transfer to R to bedside chair, requiring modA to recover and return to sit in chair safely.    Ambulation / Gait / Stairs / Wheelchair Mobility  Ambulation/Gait Ambulation/Gait assistance: Mod assist, +2 physical assistance Gait Distance (Feet): 60 Feet (20, 40) Assistive device: None (L rail in hallway) Gait Pattern/deviations: Step-through pattern, Decreased dorsiflexion - right, Drifts right/left, Decreased step length - right, Decreased weight shift to right General Gait Details: mod A R knee for blocking a prevention of recurvatum. Worked on R foot placement and swing through Gait velocity: decr Gait velocity interpretation: <1.31 ft/sec, indicative of household ambulator    Posture / Balance Dynamic Sitting Balance Sitting balance - Comments: Minor LOB noted with lifting of 1 UE at one instance, but otherwise min guard for safety and no LOB without and with support. Balance Overall balance assessment: Needs  assistance Sitting-balance support: Feet supported, Bilateral upper extremity supported Sitting balance-Leahy Scale: Fair Sitting balance - Comments: Minor LOB noted with lifting of 1 UE at one instance, but otherwise min guard for safety and no LOB without and with support. Standing balance support: Bilateral upper extremity supported, During functional activity Standing balance-Leahy Scale: Poor Standing balance comment: B UE support on RW with hand-over-hand assistance for R hand on RW grip, modA and R knee block for safety as pt would easily become dizzy and lose balance.    Special needs/care consideration Special service needs oncology f/u and Designated visitor [place] (from acute therapy documentation) Living Arrangements: Spouse/significant other  Lives With: Significant other Available Help at Discharge: Family, Available 24 hours/day  Type of Home: House Home Layout: Two level, Bed/bath upstairs, 1/2 bath on main level, Able to live on main level with bedroom/bathroom Alternate Level Stairs-Rails: Left Alternate Level Stairs-Number of Steps: 15 Home Access: Stairs to enter Entrance Stairs-Number of Steps: 1 Bathroom Shower/Tub: Multimedia programmer: [place]: No  Discharge Living Setting Plans for Discharge Living Setting: Lives with (comment) (fiance and neice) Type of Home at Discharge: House Discharge Home Layout: Two level, 1/2 bath on main level Alternate Level Stairs-Rails: Left Alternate Level Stairs-Number of Steps: 15 Discharge Home Access: Stairs to enter Entrance Stairs-Rails: None Entrance Stairs-Number of Steps: 1 Discharge Bathroom Shower/Tub: Walk-in shower Discharge Bathroom Toilet: Standard Discharge Bathroom Accessibility: Yes How Accessible: Accessible via walker Does the patient have any problems obtaining your medications?: Yes (Describe) (health cost sharing plan, not  insured)  Social/Family/Support Systems Patient Roles: Partner Contact Information: Moss Mc (fiance) [phone] Anticipated Caregiver: Courtney Coleman (neice) and Carver Fila Anticipated Caregiver's Contact Information: Courtney Coleman [phone] Ability/Limitations of Caregiver: n/a Caregiver Availability: 24/7 Discharge Plan Discussed with Primary Caregiver: Yes Is Caregiver In Agreement with Plan?: Yes Does Caregiver/Family have Issues with Lodging/Transportation while Pt is in Rehab?: No   Goals Patient/Family Goal for Rehab: PT/OT/SLP supervision to mod I Expected length of stay: 13-16 days Cultural Considerations: from the [place] Pt/Family Agrees to Admission and willing to participate: Yes Program Orientation Provided & Reviewed with Pt/Caregiver Including Roles  & Responsibilities: Yes  Barriers to Discharge: Insurance for SNF coverage   Decrease burden of Care through IP rehab admission: n/a   Possible need for SNF placement upon discharge:Not anticipated   Patient Condition: This patient's medical and functional status has changed since the consult dated: 03/30/2020 in which the Rehabilitation Physician determined and documented that the patient's condition is appropriate for intensive rehabilitative care in an inpatient rehabilitation facility. See "History of Present Illness" (above) for medical update. Functional changes are: min to mod assist for up to 40', limited by dizziness on 11/3. Patient's medical and functional status update has been discussed with the Rehabilitation physician and patient remains appropriate for inpatient rehabilitation. Will admit to inpatient rehab today.  Preadmission Screen Completed By:  Michel Santee, PT, DPT 04/01/2020 11:25 AM ______________________________________________________________________   Discussed status with Dr. Posey Pronto on 04/01/20 at 11:29 AM  and received approval for admission today.  Admission Coordinator:  Michel Santee, PT, DPT  time 11:29 AM Sudie Grumbling 04/01/20

## 2020-04-01 NOTE — Progress Notes (Addendum)
Inpatient Rehab Admissions Coordinator:   Spoke with representative at [facility] regarding patient's coverage.  Pt will most likely be self pay based on plan guidelines.  I reviewed cost of CIR with patient, finacee, and family member and they are all agreeable to proceeding with CIR admit.  Will page Dr. Waldron Labs to confirm pt is ready to admit today.   Addendum: Dr. Waldron Labs in agreement for pt to admit to CIR today.  Will let pt/family and TOC team know.   Shann Medal, PT, DPT Admissions Coordinator [phone] 04/01/20  11:21 AM

## 2020-04-02 ENCOUNTER — Inpatient Hospital Stay (HOSPITAL_COMMUNITY): Payer: No Typology Code available for payment source | Admitting: Occupational Therapy

## 2020-04-02 ENCOUNTER — Inpatient Hospital Stay (HOSPITAL_COMMUNITY): Payer: No Typology Code available for payment source

## 2020-04-02 ENCOUNTER — Inpatient Hospital Stay (HOSPITAL_COMMUNITY): Payer: No Typology Code available for payment source | Admitting: Speech Pathology

## 2020-04-02 DIAGNOSIS — D62 Acute posthemorrhagic anemia: Secondary | ICD-10-CM

## 2020-04-02 DIAGNOSIS — G8191 Hemiplegia, unspecified affecting right dominant side: Secondary | ICD-10-CM | POA: Diagnosis not present

## 2020-04-02 DIAGNOSIS — I6349 Cerebral infarction due to embolism of other cerebral artery: Secondary | ICD-10-CM | POA: Diagnosis not present

## 2020-04-02 DIAGNOSIS — I639 Cerebral infarction, unspecified: Secondary | ICD-10-CM | POA: Diagnosis not present

## 2020-04-02 DIAGNOSIS — E1165 Type 2 diabetes mellitus with hyperglycemia: Secondary | ICD-10-CM

## 2020-04-02 DIAGNOSIS — I10 Essential (primary) hypertension: Secondary | ICD-10-CM

## 2020-04-02 LAB — CBC WITH DIFFERENTIAL/PLATELET
Abs Immature Granulocytes: 0.01 10*3/uL (ref 0.00–0.07)
Basophils Absolute: 0 10*3/uL (ref 0.0–0.1)
Basophils Relative: 1 %
Eosinophils Absolute: 0.3 10*3/uL (ref 0.0–0.5)
Eosinophils Relative: 4 %
HCT: 34.5 % — ABNORMAL LOW (ref 36.0–46.0)
Hemoglobin: 10.9 g/dL — ABNORMAL LOW (ref 12.0–15.0)
Immature Granulocytes: 0 %
Lymphocytes Relative: 42 %
Lymphs Abs: 2.8 10*3/uL (ref 0.7–4.0)
MCH: 26.3 pg (ref 26.0–34.0)
MCHC: 31.6 g/dL (ref 30.0–36.0)
MCV: 83.3 fL (ref 80.0–100.0)
Monocytes Absolute: 0.5 10*3/uL (ref 0.1–1.0)
Monocytes Relative: 7 %
Neutro Abs: 3.1 10*3/uL (ref 1.7–7.7)
Neutrophils Relative %: 46 %
Platelets: 249 10*3/uL (ref 150–400)
RBC: 4.14 MIL/uL (ref 3.87–5.11)
RDW: 19.4 % — ABNORMAL HIGH (ref 11.5–15.5)
WBC: 6.8 10*3/uL (ref 4.0–10.5)
nRBC: 0 % (ref 0.0–0.2)

## 2020-04-02 LAB — COMPREHENSIVE METABOLIC PANEL
ALT: 12 U/L (ref 0–44)
AST: 18 U/L (ref 15–41)
Albumin: 2.7 g/dL — ABNORMAL LOW (ref 3.5–5.0)
Alkaline Phosphatase: 47 U/L (ref 38–126)
Anion gap: 10 (ref 5–15)
BUN: 12 mg/dL (ref 6–20)
CO2: 23 mmol/L (ref 22–32)
Calcium: 9 mg/dL (ref 8.9–10.3)
Chloride: 106 mmol/L (ref 98–111)
Creatinine, Ser: 0.65 mg/dL (ref 0.44–1.00)
GFR, Estimated: >60 mL/min (ref >60)
Glucose, Bld: 114 mg/dL — ABNORMAL HIGH (ref 70–99)
Potassium: 3.6 mmol/L (ref 3.5–5.1)
Sodium: 139 mmol/L (ref 135–145)
Total Bilirubin: 0.7 mg/dL (ref 0.3–1.2)
Total Protein: 6.8 g/dL (ref 6.5–8.1)

## 2020-04-02 LAB — GLUCOSE, CAPILLARY
Glucose-Capillary: 110 mg/dL — ABNORMAL HIGH (ref 70–99)
Glucose-Capillary: 131 mg/dL — ABNORMAL HIGH (ref 70–99)
Glucose-Capillary: 107 mg/dL — ABNORMAL HIGH (ref 70–99)
Glucose-Capillary: 138 mg/dL — ABNORMAL HIGH (ref 70–99)

## 2020-04-02 NOTE — Progress Notes (Signed)
[place] PHYSICAL MEDICINE & REHABILITATION PROGRESS NOTE  Subjective/Complaints: Patient seen laying in bed this AM.  She states she slept well overnight.  She is ready to begin therapies.  ROS: Denies CP, SOB, N/V/D  Objective: Vital Signs: Blood pressure (!) 150/70, pulse (!) 46, temperature 98.2 F (36.8 C), resp. rate 16, height 5\' 6"  (1.676 m), weight 85.3 kg, SpO2 100 %. No results found. Recent Labs    04/02/20 0438  WBC 6.8  HGB 10.9*  HCT 34.5*  PLT 249   Recent Labs    04/02/20 0438  NA 139  K 3.6  CL 106  CO2 23  GLUCOSE 114*  BUN 12  CREATININE 0.65  CALCIUM 9.0    Intake/Output Summary (Last 24 hours) at 04/02/2020 1714 Last data filed at 04/02/2020 1300 Gross per 24 hour  Intake 840 ml  Output --  Net 840 ml        Physical Exam: BP (!) 150/70 (BP Location: Left Arm)   Pulse (!) 46   Temp 98.2 F (36.8 C)   Resp 16   Ht 5\' 6"  (1.676 m)   Wt 85.3 kg   SpO2 100%   BMI 30.35 kg/m  Constitutional: No distress . Vital signs reviewed. Obese.  HENT: Normocephalic.  Atraumatic. Eyes: EOMI. No discharge. Cardiovascular: No JVD.  RRR. Respiratory: Normal effort.  No stridor.  Bilateral clear to auscultation. GI: Non-distended.  BS +. Skin: Warm and dry.  Intact. Psych: Normal mood.  Normal behavior. Musc: No edema in extremities.  No tenderness in extremities. Neuro: Alert Mild right facial weakness Motor: LUE/LLE: 5/5 proximal distal RUE: 4/5 proximal distal with apraxia, unchanged RLE: 4-4+/5 proximal distal, unchanged  Assessment/Plan: 1. 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  Physiatrist is providing close team supervision and 24 hour management of active medical problems listed  below.  Physiatrist and rehab team continue to assess barriers to discharge/monitor patient progress toward functional and medical goals   Care Tool:  Bathing    Body parts bathed by patient: Right arm, Chest, Abdomen, Right upper leg, Left upper leg, Face   Body parts bathed by helper: Left arm, Front perineal area, Buttocks, Right lower leg, Left lower leg     Bathing assist Assist Level: Moderate Assistance - Patient 50 - 74%     Upper Body Dressing/Undressing Upper body dressing   What is the patient wearing?: Pull over shirt    Upper body assist Assist Level: Maximal Assistance - Patient 25 - 49%    Lower Body Dressing/Undressing Lower body dressing      What is the patient wearing?: Pants, Incontinence brief     Lower body assist Assist for lower body dressing: Maximal Assistance - Patient 25 - 49%     Toileting Toileting    Toileting assist Assist for toileting: Total Assistance - Patient < 25% (did also have an incontient episode as well)     Transfers Chair/bed transfer  Transfers assist     Chair/bed transfer assist level: Moderate Assistance - Patient 50 - 74%     Locomotion Ambulation   Ambulation assist      Assist level: 2 helpers (modA with chair follow) Assistive device: Walker-rolling Max distance: 5ft   Walk 10 feet activity   Assist  Assist level: 2 helpers (modA and chair follow) Assistive device: Walker-rolling   Walk 50 feet activity   Assist Walk 50 feet with 2 turns activity did not occur: Safety/medical concerns         Walk 150 feet activity   Assist Walk 150 feet activity did not occur: Safety/medical concerns         Walk 10 feet on uneven surface  activity   Assist Walk 10 feet on uneven surfaces activity did not occur: Safety/medical concerns         Wheelchair     Assist Will patient use wheelchair at discharge?: No             Wheelchair 50 feet with 2 turns  activity    Assist            Wheelchair 150 feet activity     Assist           Medical Problem List and Plan: 1.  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    Begin CIR evaluations 2.  Antithrombotics: -DVT/anticoagulation:  Pharmaceutical: Other (comment)--now on Eliquis.              -antiplatelet therapy: N/A 3. Pain Management: Tylenol prn.  4. Mood: LCSW to follow for evaluation and support.              -antipsychotic agents: N/A 5. Neuropsych: This patient is capable of making decisions on her own behalf. 6. Skin/Wound Care: Routine pressure relief measures.  7. Fluids/Electrolytes/Nutrition: Monitor I/Os.               BMP within acceptable range on 11/4 8. HTN: Monitor BP tid--now on coreg and Entresto. Avoid LBP due to L-PCA stenosis  Orthostatics ordered             Monitor with increased mobility 9. A fib s/p cardioversion: Monitor HR tid--on coreg bid, amiodarone and Xarelto.              Monitor with increased exertion 10. T2DM with hyperglycemia: Hgb A1c- 7.2. Monitor BS ac/hs. Diabetic education             Monitor with increased mobility 11. Breast cancer: Arimidex--diagnosed in August but not had follow up due to multiple admissions secondary to cardiac issues. Family has been requesting referral to Hem/onc/surgeon in town--? Referral sent.  12. ABLA  Hb 10.9 on 11/4  Cont to monitor  LOS: 1 days A FACE TO FACE EVALUATION WAS PERFORMED  Senai Kingsley Lorie Phenix 04/02/2020, 5:14 PM

## 2020-04-02 NOTE — Evaluation (Signed)
Occupational Therapy Assessment and Plan  Patient Details  Name: Courtney Coleman MRN: [id number] Date of Birth: 08-26-1965  OT Diagnosis: apraxia, hemiplegia affecting dominant side and muscle weakness (generalized) Rehab Potential: Rehab Potential (ACUTE ONLY): Good ELOS: 14-16 days   Today's Date: 04/02/2020 OT Individual Time: 4332-9518 OT Individual Time Calculation (min): 55 min     Hospital Problem: Active Problems:   Thalamic stroke [facility])   Past Medical History:  Past Medical History:  Diagnosis Date   Breast cancer ([place])    CHF (congestive heart failure) ([place])    Dysrhythmia    Hypertension    Past Surgical History:  Past Surgical History:  Procedure Laterality Date   BREAST BIOPSY      Assessment & Plan Clinical Impression: Patient is a 54 y.o. year old female  female with history of HTN, breast cancer (dx 12/2019), Afib s/p cardioversion who was admitted on 03/27/2020 with reports of 24 hour history of speech difficutly, mild confusion, and right sided hemiparesis with difficulty walking.  History taken from chart review, husband, and patient.  UDS negative.  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  Echocardiogram with ejection fraction of 60-5%, no wall abnormality, mild to moderate aortic sclerosis and severe left atrial dilatation.  Patient's home Xarelto was changed to Eliquis due to failure of treatment.  Hospital course further complicated by transient worsening of right-sided weakness on 03/28/2020 due to orthostatic hypotension and was treated with IV fluids and bedrest.  Dr. Erlinda Hong felt the stroke was embolic from A. fib and to avoid low blood pressure given left PCA stenosis.  Patient with resultant expressive/receptive aphasia, difficulty with complex command, right inattention, orthostasis with headaches as well  as right-sided weakness affecting ADLs and mobility. Patient transferred to CIR on 04/01/2020 .    Patient currently requires mod to max A with basic self-care skills and mod A for functional mobility safely  secondary to muscle weakness, decreased cardiorespiratoy endurance, impaired timing and sequencing, unbalanced muscle activation, motor apraxia, decreased coordination and decreased motor planning, visual attention to body and field, decreased midline orientation and decreased attention to right, decreased awareness, decreased problem solving and decreased safety awareness and decreased sitting balance, decreased standing balance, decreased postural control, hemiplegia and decreased balance strategies.  Prior to hospitalization, patient could complete ADL with independent .  Patient will benefit from skilled intervention to decrease level of assist with basic self-care skills and increase independence with basic self-care skills prior to discharge home with care partner.  Anticipate patient will require 24 hour supervision and follow up home health and follow up outpatient.  OT - End of Session Activity Tolerance: Tolerates 30+ min activity without fatigue Endurance Deficit: Yes OT Assessment Rehab Potential (ACUTE ONLY): Good OT Patient demonstrates impairments in the following area(s): Balance;Cognition;Endurance;Motor;Pain;Perception;Safety;Sensory;Vision OT Basic ADL's Functional Problem(s): Grooming;Bathing;Dressing;Toileting OT Transfers Functional Problem(s): Toilet;Tub/Shower OT Plan OT Intensity: Minimum of 1-2 x/day, 45 to 90 minutes OT Frequency: 5 out of 7 days OT Duration/Estimated Length of Stay: 14-16 days OT Treatment/Interventions: Balance/vestibular training;Neuromuscular re-education;Self Care/advanced ADL retraining;Therapeutic Exercise;Cognitive remediation/compensation;DME/adaptive equipment instruction;Pain management;Skin care/wound managment;UE/LE Strength  taining/ROM;Community reintegration;Patient/family education;Splinting/orthotics;UE/LE Coordination activities OT Self Feeding Anticipated Outcome(s): n/a OT Basic Self-Care Anticipated Outcome(s): supervision OT Toileting Anticipated Outcome(s): supervision OT Bathroom Transfers Anticipated Outcome(s): supervison OT Recommendation Recommendations for Other Services: Therapeutic Recreation consult Patient destination: Home Follow Up Recommendations: Outpatient OT;24  hour supervision/assistance Equipment Recommended: To be determined   OT Evaluation Precautions/Restrictions  Precautions Precautions: Fall Precaution Comments: orthostasis Restrictions Weight Bearing Restrictions: No General Chart Reviewed: Yes Family/Caregiver Present: Yes (fiance) Vital Signs  Pain Pain Assessment Pain Scale: 0-10 Pain Score: 0-No pain Home Living/Prior Functioning Home Living Family/patient expects to be discharged to:: Private residence Living Arrangements: Spouse/significant other, Other relatives Available Help at Discharge: Family, Available 24 hours/day Type of Home: Mudlogger of Steps: 1 Home Layout: Two level, Bed/bath upstairs, 1/2 bath on main level, Able to live on main level with bedroom/bathroom Alternate Level Stairs-Number of Steps: 15 Alternate Level Stairs-Rails: Left Bathroom Shower/Tub: Multimedia programmer: Standard  Lives With: Significant other Vision Baseline Vision/History: No visual deficits Patient Visual Report: No change from baseline Vision Assessment?: Yes Ocular Range of Motion: Within Functional Limits Alignment/Gaze Preference: Gaze left Tracking/Visual Pursuits: Requires cues, head turns, or add eye shifts to track Perception  Perception: Impaired Inattention/Neglect: Does not attend to right visual field;Does not attend to right side of body Praxis Praxis: Impaired Praxis Impairment Details: Motor  planning Cognition Overall Cognitive Status: Within Functional Limits for tasks assessed Arousal/Alertness: Awake/alert Orientation Level: Person;Place;Situation Person: Oriented Place: Oriented Situation: Oriented Year: 2021 Month: November Day of Week: Correct Memory: Impaired Memory Impairment: Decreased recall of new information Immediate Memory Recall: Sock;Blue;Bed Memory Recall Sock: Not able to recall Memory Recall Blue: With Cue Memory Recall Bed: Without Cue Attention: Sustained;Selective Sustained Attention: Appears intact Selective Attention: Appears intact Awareness: Impaired Awareness Impairment: Emergent impairment Problem Solving: Impaired Problem Solving Impairment: Functional basic Safety/Judgment: Appears intact Sensation Sensation Light Touch: Impaired Detail Light Touch Impaired Details: Impaired RUE;Impaired RLE Proprioception: Impaired Detail Proprioception Impaired Details: Impaired RUE;Impaired RLE Coordination Gross Motor Movements are Fluid and Coordinated: No Fine Motor Movements are Fluid and Coordinated: No Finger Nose Finger Test: dismetria Motor  Motor Motor: Abnormal postural alignment and control;Motor apraxia;Hemiplegia  Trunk/Postural Assessment  Cervical Assessment Cervical Assessment: Within Functional Limits Thoracic Assessment Thoracic Assessment:  (rounded shoulders) Lumbar Assessment Lumbar Assessment:  (posterior pelvic tilt) Postural Control Postural Control: Deficits on evaluation Righting Reactions: delayed- falls to the right Protective Responses: delayed  Balance Balance Balance Assessed: Yes Static Sitting Balance Static Sitting - Level of Assistance: 5: Stand by assistance Dynamic Sitting Balance Dynamic Sitting - Balance Support: During functional activity Dynamic Sitting - Level of Assistance: 4: Min assist;3: Mod assist Static Standing Balance Static Standing - Balance Support: During functional activity;No  upper extremity supported Static Standing - Level of Assistance: 4: Min assist;3: Mod assist Extremity/Trunk Assessment RUE Assessment RUE Assessment: Exceptions to [facility] RUE Body System: Neuro Brunstrum levels for arm and hand: Arm;Hand Brunstrum level for arm: Stage V Relative Independence from Synergy Brunstrum level for hand: Stage V Independence from basic synergies RUE Tone RUE Tone: Within Functional Limits LUE Assessment LUE Assessment: Within Functional Limits  Care Tool Care Tool Self Care Eating        Oral Care    Oral Care Assist Level: Minimal Assistance - Patient > 75%    Bathing   Body parts bathed by patient: Right arm;Chest;Abdomen;Right upper leg;Left upper leg;Face Body parts bathed by helper: Left arm;Front perineal area;Buttocks;Right lower leg;Left lower leg   Assist Level: Moderate Assistance - Patient 50 - 74%    Upper Body Dressing(including orthotics)   What is the patient wearing?: Pull over shirt   Assist Level: Maximal Assistance - Patient 25 - 49%    Lower Body Dressing (excluding footwear)  What is the patient wearing?: Pants;Incontinence brief Assist for lower body dressing: Maximal Assistance - Patient 25 - 49%    Putting on/Taking off footwear   What is the patient wearing?: Non-skid slipper socks Assist for footwear: Total Assistance - Patient < 25%       Care Tool Toileting Toileting activity   Assist for toileting: Total Assistance - Patient < 25% (did also have an incontient episode as well)     Care Tool Bed Mobility Roll left and right activity        Sit to lying activity   Sit to lying assist level: Maximal Assistance - Patient 25 - 49%    Lying to sitting edge of bed activity         Care Tool Transfers Sit to stand transfer   Sit to stand assist level: Moderate Assistance - Patient 50 - 74%    Chair/bed transfer   Chair/bed transfer assist level: Moderate Assistance - Patient 50 - 74%     Toilet transfer    Assist Level: Maximal Assistance - Patient 24 - 49%     Care Tool Cognition Expression of Ideas and Wants Expression of Ideas and Wants: Some difficulty - exhibits some difficulty with expressing needs and ideas (e.g, some words or finishing thoughts) or speech is not clear   Understanding Verbal and Non-Verbal Content Understanding Verbal and Non-Verbal Content: Understands (complex and basic) - clear comprehension without cues or repetitions   Memory/Recall Ability *first 3 days only Memory/Recall Ability *first 3 days only: Current season;That he or she is in a hospital/hospital unit    Refer to Care Plan for [place] 1 OT Short Term Goal 1 (Week 1): Pt will recall functional place to put right UE during grooming tasks with min questioning cues OT Short Term Goal 2 (Week 1): Pt will perform sit to stands with contact guard with recall with min A for feet placement OT Short Term Goal 3 (Week 1): Pt will don shirt with min A with hemi dressing techniques OT Short Term Goal 4 (Week 1): Pt will thread underwear and pants with min A with extra time  Recommendations for other services: Therapeutic Recreation  Outing/community reintegration   Skilled Therapeutic Intervention 1:1 Eval initiated with pt and pt's fiance with Ot goals, purpose and role discussed. Self care retraining at shower level. Pt received in the w/c. Pt ambulated with RW ~10 feet into the bathroom with max A with max tactile and manual facilitation for safe weight shifting, balance, management of right LE (knee supported to prevent hyperextension and to avoid adduction )and UE, functional use of RW. Anticipate pt will need a hand splint on RW if continue with the RW before improvement in functional grasp. Pt did demonstrate decr visual attention to right side of body and visual field but with min cues could correct. Pt with LOB with dynamic sitting in the shower and with controlled descents to the  toilet; falling to the right required A to return to midline. Educated and practiced functional use of right hand with min to mod A to use at dominant level. Also taught hemi undressing (before shower ) and dressing afterwards. Pt required cues for right foot placement in prep for each transfer anf for a task for right hand to be given for Ue to be engaged. Pt performed oral care at the sink with min A for engagement of right hand. Pt requested going back to bed  at end of session- mod A transfer back to bed at end of session.  ADL ADL Where Assessed-Eating: Wheelchair Grooming: Minimal assistance Where Assessed-Grooming: Sitting at sink Upper Body Bathing: Moderate assistance Where Assessed-Upper Body Bathing: Shower Lower Body Bathing: Maximal assistance Where Assessed-Lower Body Bathing: Shower;Sitting at sink Upper Body Dressing: Maximal assistance Where Assessed-Upper Body Dressing: Sitting at sink Lower Body Dressing: Maximal assistance Toileting: Maximal assistance Where Assessed-Toileting: Glass blower/designer: Maximal assistance;Moderate assistance Toilet Transfer Method: Stand pivot Toilet Transfer Equipment: Energy manager: Maximal Firefighter Method: Radiographer, therapeutic: Manufacturing systems engineer  Bed Mobility Bed Mobility: Sit to Supine Sit to Supine: Maximal Assistance - Patient 25-49% Transfers Sit to Stand: Moderate Assistance - Patient 50-74% Stand to Sit: Moderate Assistance - Patient 50-74%   Discharge Criteria: Patient will be discharged from OT if patient refuses treatment 3 consecutive times without medical reason, if treatment goals not met, if there is a change in medical status, if patient makes no progress towards goals or if patient is discharged from hospital.  The above assessment, treatment plan, treatment alternatives and goals were discussed and mutually agreed upon: by patient and by  family  Nicoletta Ba 04/02/2020, 2:19 PM

## 2020-04-02 NOTE — Progress Notes (Signed)
[facility] Individual Statement of Services  Patient Name:  Courtney Coleman  Date:  04/02/2020  Welcome to the [facility].  Our goal is to provide you with an individualized program based on your diagnosis and situation, designed to meet your specific needs.  With this comprehensive rehabilitation program, you will be expected to participate in at least 3 hours of rehabilitation therapies Monday-Friday, with modified therapy programming on the weekends.  Your rehabilitation program will include the following services:  Physical Therapy (PT), Occupational Therapy (OT), Speech Therapy (ST), 24 hour per day rehabilitation nursing, Neuropsychology, Care Coordinator, Rehabilitation Medicine, Nutrition Services and Pharmacy Services  Weekly team conferences will be held on Wednesday to discuss your progress.  Your Inpatient Rehabilitation Care Coordinator will talk with you frequently to get your input and to update you on team discussions.  Team conferences with you and your family in attendance may also be held.  Expected length of stay: 16-20 days-approx  Overall anticipated outcome: supervision-CGA level  Depending on your progress and recovery, your program may change. Your Inpatient Rehabilitation Care Coordinator will coordinate services and will keep you informed of any changes. Your Inpatient Rehabilitation Care Coordinator's name and contact numbers are listed  below.  The following services may also be recommended but are not provided by the [facility]:    [employer] will be made to provide these services after discharge if needed.  Arrangements include referral to agencies that provide these services.  Your insurance has been verified to be:  [employer] Your primary doctor is:  New patient appointment with [name]  Pertinent information  will be shared with your doctor and your insurance company.  Inpatient Rehabilitation Care Coordinator:  Ovidio Kin, [place] or Emilia Beck  Information discussed with and copy given to patient by: [name], 04/02/2020, 12:21 PM

## 2020-04-02 NOTE — Plan of Care (Signed)
  Problem: RH BOWEL ELIMINATION Goal: RH STG MANAGE BOWEL WITH ASSISTANCE Description: STG Manage Bowel with Assistance. Outcome: Progressing   Problem: RH BLADDER ELIMINATION Goal: RH STG MANAGE BLADDER WITH ASSISTANCE Description: STG Manage Bladder With modi Assistance Outcome: Progressing

## 2020-04-02 NOTE — Plan of Care (Signed)
Nutrition Education Note  RD consulted for nutrition education regarding diabetes.  Spoke with pt and husband [name] at bedside. Pt reports that she has a great appetite and has been feeling better with the way she has been eating during this admission.  Pt reports that PTA, she typically consumed 3 meals daily. A meal typically included a protein, a starch, and a vegetable. Typical protein options include baked or grilled chicken or fish. Typical starch options include baked potato, mashed potato, and mac and cheese. Vegetable options may include peas.  Pt typically drinks soda and juice at home. She also consumes hot chocolate, tea, and coffee.  Lab Results  Component Value Date   HGBA1C 7.2 (H) 03/28/2020    RD provided "Carbohydrate Counting for People with Diabetes" handout from the Academy of Nutrition and Dietetics. Discussed different food groups and their effects on blood sugar, emphasizing carbohydrate-containing foods. Provided list of carbohydrates and recommended serving sizes of common foods.  Discussed importance of controlled and consistent carbohydrate intake throughout the day. Provided examples of ways to balance meals/snacks and encouraged intake of high-fiber, whole grain complex carbohydrates. Teach back method used.  Expect good compliance.  Current diet order is Heart Healthy/Carb Modified, patient is consuming approximately 100% of meals at this time. Labs and medications reviewed. No further nutrition interventions warranted at this time. RD contact information provided. If additional nutrition issues arise, please re-consult RD.   Gaynell Face, MS, RD, LDN Inpatient Clinical Dietitian Please see [facility] for contact information.

## 2020-04-02 NOTE — Evaluation (Signed)
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 Breast cancer ([place])   . CHF (congestive heart failure) ([place])   . Dysrhythmia   . Hypertension    Past Surgical History:  Past Surgical History:  Procedure Laterality Date  . BREAST BIOPSY      Assessment & Plan Clinical Impression: Patient is a 54 year old female with history of HTN, breast cancer (dx 12/2019), Afib s/p cardioversion who was admitted on 03/27/2020 with reports of 24 hour history of speech difficutly, mild confusion, and right sided hemiparesis with difficulty walking.  History taken from chart review, husband, and patient.  UDS negative.  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  Echocardiogram with ejection fraction of 60-5%, no wall abnormality, mild to moderate aortic sclerosis and severe left atrial dilatation.  Patient's home Xarelto was changed to Eliquis due to failure of treatment.  Hospital course further complicated by transient worsening of right-sided weakness on 03/28/2020 due to orthostatic hypotension and was treated with IV fluids and bedrest.  Dr. Erlinda Hong felt the stroke was embolic from A. fib and to avoid low blood pressure given left PCA stenosis.  Patient with resultant expressive/receptive aphasia, difficulty with complex command, right inattention, orthostasis with  headaches as well as right-sided weakness affecting ADLs and mobility.  CIR was recommended due to functional decline.  Patient transferred to CIR on 04/01/2020 .   Patient currently requires mod with mobility secondary to muscle weakness, impaired timing and sequencing, unbalanced muscle activation, motor apraxia and decreased motor planning and decreased attention to right and decreased motor planning.  Prior to hospitalization, patient was independent  with mobility and lived with Significant other in a House home.  Home access is 1Stairs to enter.  Patient will benefit from skilled PT intervention to maximize safe functional mobility, minimize fall risk and decrease caregiver burden for planned discharge home with 24 hour assist.  Anticipate patient will benefit from HHPT vs OPPT pending progress and access to transportation at discharge.  PT - End of Session Activity Tolerance: Tolerates 30+ min activity with multiple rests Endurance Deficit: Yes Endurance Deficit Description: Short seated rests for recovery during functional mobility tasks PT Assessment Rehab Potential (ACUTE/IP ONLY): Good PT Barriers to Discharge: [place] home environment;Home environment access/layout;Insurance for SNF coverage PT Patient demonstrates impairments in the following area(s): Balance;Endurance;Motor;Sensory;Safety;Perception;Skin Integrity PT Transfers Functional Problem(s): Bed Mobility;Bed to Chair;Car PT Locomotion Functional Problem(s): Ambulation;Stairs PT Plan PT Intensity: Minimum of 1-2 x/day ,45 to 90 minutes PT Frequency: 5 out of 7 days PT Duration Estimated Length of Stay: 2-3 weeks PT Treatment/Interventions: Ambulation/gait training;Balance/vestibular training;Community reintegration;Cognitive remediation/compensation;Discharge planning;Disease management/prevention;Functional electrical stimulation;DME/adaptive equipment instruction;Functional mobility training;Neuromuscular  re-education;Patient/family education;Pain management;Psychosocial support;Splinting/orthotics;Therapeutic Activities;UE/LE Strength taining/ROM;Visual/perceptual remediation/compensation;Wheelchair propulsion/positioning;UE/LE Coordination activities;Therapeutic Exercise;Stair training;Skin care/wound management PT Transfers Anticipated Outcome(s): CGA with LRAD PT Locomotion Anticipated Outcome(s): CGA with LRAD PT Recommendation Follow Up Recommendations: 24 hour supervision/assistance;Home health PT;Outpatient PT Patient destination: Home  Equipment Recommended: To be determined Equipment Details: Pt owns no DME   PT Evaluation Precautions/Restrictions Precautions Precautions: Fall Precaution Comments: R hemiparesis, orthostasis Restrictions Weight Bearing Restrictions: No General Chart Reviewed: Yes Additional Pertinent History: A. fib s/p cardioversion, CHF, HTN, left breast mass s/p biopsy positive for cancer (12/2019 [place]) Family/Caregiver Present: Yes (Fiance) Vital SignsTherapy Vitals Temp: 98.2 F (36.8 C) Pulse Rate: (!) 46 Resp: 16 BP: (!) 150/70 Patient Position (if appropriate): Lying Oxygen Therapy SpO2: 100 % O2 Device: Room Air Pain Pain Assessment Pain Scale: 0-10 Pain Score: 0-No pain Home Living/Prior Functioning Home Living Available Help at Discharge: Family;Available 24 hours/day Type of Home: House Home Access: Stairs to enter [employer] of Steps: 1 Entrance Stairs-Rails: None Home Layout: Two level;Bed/bath upstairs;1/2 bath on main level;Able to live on main level with bedroom/bathroom Alternate Level Stairs-Number of Steps: 15 Alternate Level Stairs-Rails: Left Bathroom Shower/Tub: Multimedia programmer: Standard  Lives With: Significant other Prior Function Level of Independence: Independent with gait;Independent with transfers  Able to Take Stairs?: Yes Driving: Yes Vision/Perception  Vision - Assessment Ocular  Range of Motion: Within Functional Limits Alignment/Gaze Preference: Gaze left Tracking/Visual Pursuits: Requires cues, head turns, or add eye shifts to track Perception Perception: Impaired Inattention/Neglect: Does not attend to right visual field;Does not attend to right side of body Praxis Praxis: Impaired Praxis Impairment Details: Motor planning  Cognition Overall Cognitive Status: Within Functional Limits for tasks assessed Arousal/Alertness: Awake/alert Orientation Level: Oriented X4 Attention: Sustained;Selective Sustained Attention: Appears intact Selective Attention: Appears intact Memory: Impaired Memory Impairment: Decreased recall of new information Immediate Memory Recall: Sock;Blue;Bed Memory Recall Sock: Not able to recall Memory Recall Blue: With Cue Memory Recall Bed: Without Cue Awareness: Impaired Awareness Impairment: Emergent impairment Problem Solving: Impaired Problem Solving Impairment: Functional basic Safety/Judgment: Appears intact Sensation Sensation Light Touch: Impaired Detail Peripheral sensation comments: Able to detect light touch, diminished sensation Light Touch Impaired Details: Impaired RUE;Impaired RLE Proprioception: Impaired Detail Proprioception Impaired Details: Impaired RUE;Impaired RLE Coordination Gross Motor Movements are Fluid and Coordinated: No Fine Motor Movements are Fluid and Coordinated: No Coordination and Movement Description: slow and effortful, mild delay Finger Nose Finger Test: dysmetria Motor  Motor Motor: Abnormal postural alignment and control;Motor apraxia;Hemiplegia   Trunk/Postural Assessment  Cervical Assessment Cervical Assessment: Within Functional Limits Thoracic Assessment Thoracic Assessment: Exceptions to [facility] (Rounded shoulders) Lumbar Assessment Lumbar Assessment: Exceptions to [facility] (posterior pelvic tilt) Postural Control Postural Control: Deficits on evaluation Righting Reactions: delayed-  falls to the right Protective Responses: delayed  Balance Balance Balance Assessed: Yes Static Sitting Balance Static Sitting - Balance Support: Feet supported Static Sitting - Level of Assistance: 5: Stand by assistance Dynamic Sitting Balance Dynamic Sitting - Balance Support: During functional activity Dynamic Sitting - Level of Assistance: 4: Min assist Static Standing Balance Static Standing - Balance Support: During functional activity;No upper extremity supported Static Standing - Level of Assistance: 3: Mod assist Extremity Assessment  RUE Assessment RUE Assessment: Exceptions to [facility] General Strength Comments: weak grasp, 3+/5 wrist ext, 4-/5 elbow flex, 4-/5, elbow ext. shldr flex 4-/5 RUE Body System: Neuro Brunstrum levels for arm and hand: Arm;Hand Brunstrum level for arm: Stage V Relative Independence from Synergy Brunstrum level for hand: Stage V Independence from basic synergies RUE Tone RUE Tone: Within Functional Limits LUE Assessment LUE Assessment: Within Functional Limits RLE Assessment RLE Assessment: Exceptions to [facility] General Strength Comments: able to wiggle toes. Ankle DF 0/5, knee ext 4-/5, hip flex 3/5 LLE Assessment LLE  Assessment: Within Functional Limits  Care Tool Care Tool Bed Mobility Roll left and right activity   Roll left and right assist level: Moderate Assistance - Patient 50 - 74%    Sit to lying activity   Sit to lying assist level: Moderate Assistance - Patient 50 - 74%    Lying to sitting edge of bed activity   Lying to sitting edge of bed assist level: Moderate Assistance - Patient 50 - 74%     Care Tool Transfers Sit to stand transfer   Sit to stand assist level: Moderate Assistance - Patient 50 - 74%    Chair/bed transfer   Chair/bed transfer assist level: Moderate Assistance - Patient 50 - 74%     Toilet transfer   Assist Level: Maximal Assistance - Patient 24 - 49%    Car transfer   Car transfer assist level:  Moderate Assistance - Patient 50 - 74%      Care Tool Locomotion Ambulation   Assist level: 2 helpers (modA with chair follow) Assistive device: Walker-rolling Max distance: 82f  Walk 10 feet activity   Assist level: 2 helpers (modA and chair follow) Assistive device: Walker-rolling   Walk 50 feet with 2 turns activity Walk 50 feet with 2 turns activity did not occur: Safety/medical concerns      Walk 150 feet activity Walk 150 feet activity did not occur: Safety/medical concerns      Walk 10 feet on uneven surfaces activity Walk 10 feet on uneven surfaces activity did not occur: Safety/medical concerns      Stairs Stair activity did not occur: Safety/medical concerns        Walk up/down 1 step activity Walk up/down 1 step or curb (drop down) activity did not occur: Safety/medical concerns     Walk up/down 4 steps activity did not occuR: Safety/medical concerns  Walk up/down 4 steps activity      Walk up/down 12 steps activity Walk up/down 12 steps activity did not occur: Safety/medical concerns      Pick up small objects from floor Pick up small object from the floor (from standing position) activity did not occur: Safety/medical concerns      Wheelchair Will patient use wheelchair at discharge?: No          Wheel 50 feet with 2 turns activity      Wheel 150 feet activity        Refer to Care Plan for Long Term Goals  SHORT TERM GOAL WEEK 1 PT Short Term Goal 1 (Week 1): Pt will complete bed mobility with minA PT Short Term Goal 2 (Week 1): Pt will perform bed<>chair transfers with minA and LRAD PT Short Term Goal 3 (Week 1): Pt will ambulate 725fwith minA and LRAD PT Short Term Goal 4 (Week 1): Pt will negotiate up/down x4 steps with modA and LRAD  Recommendations for other services: None   Skilled Therapeutic Intervention Mobility Bed Mobility Bed Mobility: Rolling Right;Rolling Left;Supine to Sit Rolling Right: Moderate Assistance - Patient  50-74% Rolling Left: Moderate Assistance - Patient 50-74% Supine to Sit: Moderate Assistance - Patient 50-74% Sit to Supine: Maximal Assistance - Patient 25-49% Transfers Transfers: Sit to Stand;Stand to Sit;Stand Pivot Transfers Sit to Stand: Moderate Assistance - Patient 50-74% Stand to Sit: Moderate Assistance - Patient 50-74% Stand Pivot Transfers: Moderate Assistance - Patient 50 - 74% Stand Pivot Transfer Details: Visual cues/gestures for sequencing;Manual facilitation for weight bearing;Tactile cues for weight shifting;Verbal cues for safe use of DME/AE;Manual  facilitation for weight shifting;Tactile cues for posture;Verbal cues for precautions/safety;Manual facilitation for placement;Verbal cues for sequencing;Verbal cues for technique;Verbal cues for gait pattern;Visual cues/gestures for precautions/safety Transfer (Assistive device): Rolling walker Locomotion  Gait Ambulation: Yes Gait Assistance: Moderate Assistance - Patient 50-74% Gait Distance (Feet): 40 Feet Assistive device: Rolling walker Gait Assistance Details: Verbal cues for safe use of DME/AE;Verbal cues for precautions/safety;Verbal cues for technique;Verbal cues for gait pattern;Manual facilitation for weight shifting;Manual facilitation for placement;Verbal cues for sequencing;Tactile cues for weight shifting;Tactile cues for sequencing;Tactile cues for posture;Tactile cues for placement;Visual cues for safe use of DME/AE Gait Gait: Yes Gait Pattern: Impaired Gait Pattern: Step-to pattern;Decreased step length - left;Decreased step length - right;Decreased stance time - right;Decreased weight shift to right;Decreased dorsiflexion - right;Poor foot clearance - right;Narrow base of support Gait velocity: decreased Stairs / Additional Locomotion Stairs: No Wheelchair Mobility Wheelchair Mobility: No  Skilled Intervention: Pt received supine in bed, Fiance at bedside, pt agreeable and motivated to participate in PT  session. Initiated functional mobility as outlined above. Required modA for rolling L<>R without bed features, maxA for lower body dressing, modA for upper body dressing. Supine<>sit with modA with HOB flat, primarily for trunk elevation. Able to sit EOB with supervision. Stand<>pivot with modA and no AD from EOB to w/c. Provided updated and proper fitting w/c out of DME closet. Performed car transfer with modA via stand<>pivot and no AD, requiring cues for safety approach and general sequencing. She ambulated ~4f with modA and RW with chair follow for safety. Anticipate she will likely benefit from RW hand splint unless her functional grasp in RUE improves; will continue to monitor. W/c transport to main therapy gym, pt stating she did not feel comfortable attempting stairs due to instability and RLE weakness. Focused remainder of session on skilled gait training. She ambulated 2x436fwith modA and RW, cues provided for RLE heel strike, RW management, and reciprocal gait pattern. A few LOB's during gait, especially when BOS became too narrow and even more so with turns. W/c transport back to her room where she remained seated in w/c at end of session, needs in reach, chair alarm on, fiance at bedside.  Instructed pt in results of PT evaluation as detailed above, PT POC, rehab potential, rehab goals, and discharge recommendations. Additionally discussed CIR's policies regarding fall safety and use of chair alarm and/or quick release belt. Pt verbalized understanding and in agreement. Will update pt's family members as they become available.   Discharge Criteria: Patient will be discharged from PT if patient refuses treatment 3 consecutive times without medical reason, if treatment goals not met, if there is a change in medical status, if patient makes no progress towards goals or if patient is discharged from hospital.  The above assessment, treatment plan, treatment alternatives and goals were discussed and  mutually agreed upon: by patient and by family  ChAlger SimonsT, DPT 04/02/2020, 3:57 PM

## 2020-04-02 NOTE — Progress Notes (Signed)
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 Breast cancer ([place])   . CHF (congestive heart failure) ([place])   . Dysrhythmia   . Hypertension    Past Surgical History:  Past Surgical History:  Procedure Laterality Date  . BREAST BIOPSY     Social History:  reports that she has never smoked. She has never used smokeless tobacco. She reports previous alcohol use. She reports that she does not use drugs.  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 Pt and fiance were in [facility] a week ago with her sister then came here to stay with her niece.  Social History Preferred language: English Religion:  Cultural Background: From [place] Education: Some schooling Read: Yes Write: Yes Employment Status: Unemployed Public relations account executive Issues: No issues Guardian/Conservator: None-according to MD pt is capable of making her own decisions while here. Carver Fila will be here daily to provide support and see her progress.   Abuse/Neglect Abuse/Neglect Assessment Can Be Completed: Yes Physical Abuse: Denies Verbal Abuse: Denies Sexual Abuse: Denies Exploitation of patient/patient's resources: Denies Self-Neglect: Denies  Emotional Status Pt's affect, behavior and adjustment status: Pt is very appreciative to be here and be  getting the treatment she needs. She is willing to work hard and recover from this CVA. Carver Fila is supportive and will assist her in her care and keep her positive Recent Psychosocial Issues: recent diagnosis of breast cancer and other health issues Psychiatric History: No history appears to be coping appropriately and willing to talk about her concerns. Will ask neuro-psych to see while here for coping Substance Abuse History: No issues  Patient / Family Perceptions, Expectations & Goals Pt/Family understanding of illness & functional limitations: Pt and [name] can explain her breast cancer and heart issues. Both talk with the MD's involved and hopes with treatment she will do well and can return back home to the [place] Premorbid pt/family roles/activities: Partner, Mom, aunt, sibling, etc Anticipated changes in roles/activities/participation: resume Pt/family expectations/goals: Pt states: " I want to do well and then get treatment and be able to go back home."  [place] states: " I will help her with anything she needs me to do."  [employer]: Other (Comment) (Needs to get connected with many resources and follow up appointments) Premorbid Home Care/DME Agencies: None Transportation available at discharge: Carver Fila and [employer] referrals recommended: Neuropsychology  Discharge Planning Living Arrangements: Spouse/significant other, Other relatives Support Systems: Spouse/significant other, Other relatives, Children Type of Residence: Private residence Insurance Resources: Multimedia programmer (specify) ([employer]-health share insurance-basically uninsured) Museum/gallery curator Resources: Family Support Financial Screen Referred: Previously completed Living Expenses: Lives with family Money Management: Family Does the patient have any problems obtaining your medications?: Yes (Describe) (no coverage for medications) Home Management: Niece and [name] Patient/Family Preliminary  Plans: Return to nieces' home which is a townhouse with half bath downstairs and full bath upstairs. The plan is to stay here until her treatment is done then  go back to the [place]. Care Coordinator Barriers to Discharge: Home environment access/layout, Other (comments) Care Coordinator Barriers to Discharge Comments: Full bath upstairs and no insurance to cover medications or appointments Care Coordinator Anticipated Follow Up Needs: HH/OP  Clinical Impression Pleasant couple who are very appreciative and motivated to do well here and recover from her stroke. Pt has a PCP appointment set up for 04/2020 and an Oncology appt for 11/18.  Will see if can get charity for equipment and home health services. [name] appears to be a strong support for pt. Work on discharge needs. [name] 04/02/2020, 12:13 PM

## 2020-04-03 ENCOUNTER — Inpatient Hospital Stay (HOSPITAL_COMMUNITY): Payer: No Typology Code available for payment source | Admitting: Occupational Therapy

## 2020-04-03 ENCOUNTER — Inpatient Hospital Stay (HOSPITAL_COMMUNITY): Payer: No Typology Code available for payment source | Admitting: Speech Pathology

## 2020-04-03 ENCOUNTER — Inpatient Hospital Stay (HOSPITAL_COMMUNITY): Payer: No Typology Code available for payment source

## 2020-04-03 LAB — GLUCOSE, CAPILLARY
Glucose-Capillary: 105 mg/dL — ABNORMAL HIGH (ref 70–99)
Glucose-Capillary: 109 mg/dL — ABNORMAL HIGH (ref 70–99)
Glucose-Capillary: 161 mg/dL — ABNORMAL HIGH (ref 70–99)
Glucose-Capillary: 112 mg/dL — ABNORMAL HIGH (ref 70–99)

## 2020-04-03 MED ORDER — CARVEDILOL 12.5 MG PO TABS
12.5000 mg | ORAL_TABLET | Freq: Two times a day (BID) | ORAL | Status: DC
  Administered 2020-04-03: 12.5 mg via ORAL
  Filled 2020-04-03 (×4): qty 1

## 2020-04-03 NOTE — Progress Notes (Signed)
Occupational Therapy Session Note  Patient Details  Name: Courtney Coleman MRN: [id number] Date of Birth: 07-22-1965  Today's Date: 04/03/2020 OT Individual Time: 1000-1100 OT Individual Time Calculation (min): 60 min    Short Term Goals: Week 1:  OT Short Term Goal 1 (Week 1): Pt will recall functional place to put right UE during grooming tasks with min questioning cues OT Short Term Goal 2 (Week 1): Pt will perform sit to stands with contact guard with recall with min A for feet placement OT Short Term Goal 3 (Week 1): Pt will don shirt with min A with hemi dressing techniques OT Short Term Goal 4 (Week 1): Pt will thread underwear and pants with min A with extra time  Skilled Therapeutic Interventions/Progress Updates:    1:1 Self care retraining at shower level (pt's choice). Pt performed stand pivot transfer into the shower (going towards her right with mod A with use of grab bar. Pt with more automatic initiation to use right UE to assist with washing UB. Pt performed crossing LEs to wash feet in shower with A to hold right LE in place. Pt performed min A transfer back to w/c. Dressing at sink level with focus on recall of hemi dressing techniques. Pt able to don shirt with min a and able to thread brief and pants with min A. Focus on always providing right hand a job to do in all functional tasks. PT performed sit to stand with min A with manual facilitation for right knee control. Focused on pt being able to place both feet in proper position prior to standing.  A with fixing pt's hair.   NMR with homework provided for working on hand grasp and release and controlled movement of UE. Provided tan theraputty and pink foam block. Instructed pt and pt's fiance on HEP.   Left sitting up in the w/c.   Therapy Documentation Precautions:  Precautions Precautions: Fall Precaution Comments: R hemiparesis, orthostasis Restrictions Weight Bearing Restrictions: No Pain:  no c/o pain    Therapy/Group: Individual Therapy  Willeen Cass [facility] 04/03/2020, 3:37 PM

## 2020-04-03 NOTE — Progress Notes (Signed)
[place] PHYSICAL MEDICINE & REHABILITATION PROGRESS NOTE  Subjective/Complaints: No complaints this morning. Met boyfriend at bedside, very supportive She is showering with therapy this morning BP is elevated  ROS: Denies CP, SOB, N/V/D  Objective: Vital Signs: Blood pressure (!) 156/67, pulse (!) 52, temperature 97.9 F (36.6 C), temperature source Oral, resp. rate 18, height 5' 6"  (1.676 m), weight 85.3 kg, SpO2 98 %. No results found. Recent Labs    04/02/20 0438  WBC 6.8  HGB 10.9*  HCT 34.5*  PLT 249   Recent Labs    04/02/20 0438  NA 139  K 3.6  CL 106  CO2 23  GLUCOSE 114*  BUN 12  CREATININE 0.65  CALCIUM 9.0    Intake/Output Summary (Last 24 hours) at 04/03/2020 1141 Last data filed at 04/03/2020 0700 Gross per 24 hour  Intake 840 ml  Output --  Net 840 ml        Physical Exam: BP (!) 156/67 (BP Location: Right Arm)   Pulse (!) 52   Temp 97.9 F (36.6 C) (Oral)   Resp 18   Ht 5' 6"  (1.676 m)   Wt 85.3 kg   SpO2 98%   BMI 30.35 kg/m   General: Alert and oriented x 3, No apparent distress HEENT: Head is normocephalic, atraumatic, PERRLA, EOMI, sclera anicteric, oral mucosa pink and moist, dentition intact, ext ear canals clear,  Neck: Supple without JVD or lymphadenopathy Heart: Reg rate and rhythm. No murmurs rubs or gallops Chest: CTA bilaterally without wheezes, rales, or rhonchi; no distress Abdomen: Soft, non-tender, non-distended, bowel sounds positive. Extremities: No clubbing, cyanosis, or edema. Pulses are 2+ Skin: Clean and intact without signs of breakdown  Psych: Normal mood.  Normal behavior. Musc: No edema in extremities.  No tenderness in extremities. Neuro: Alert Mild right facial weakness Motor: LUE/LLE: 5/5 proximal distal RUE: 4/5 proximal distal with apraxia, unchanged RLE: 4-4+/5 proximal distal, unchanged  Assessment/Plan: 1. 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  Physiatrist is providing close team supervision and 24 hour management of active medical problems listed below.  Physiatrist and rehab team continue to assess barriers to discharge/monitor patient progress toward functional and medical goals   Care Tool:  Bathing    Body parts bathed by patient: Right arm, Chest, Abdomen, Right upper leg, Left upper leg, Face   Body parts bathed by helper: Left arm, Front perineal area, Buttocks, Right lower leg, Left lower leg     Bathing assist Assist Level: Moderate Assistance - Patient 50 - 74%     Upper Body Dressing/Undressing Upper body dressing   What is the patient wearing?: Pull over shirt    Upper body assist Assist Level: Maximal Assistance - Patient 25 - 49%    Lower Body Dressing/Undressing Lower body dressing      What is the patient wearing?: Pants, Incontinence brief     Lower body assist Assist for lower body dressing: Maximal Assistance - Patient 25 - 49%     Toileting Toileting    Toileting assist Assist for toileting: Total Assistance - Patient < 25%     Transfers Chair/bed transfer  Transfers assist     Chair/bed transfer assist level: Moderate Assistance - Patient 50 - 74%     Locomotion Ambulation  Ambulation assist      Assist level: 2 helpers (modA with chair follow) Assistive device: Walker-rolling Max distance: 54f   Walk 10 feet activity   Assist     Assist level: 2 helpers (modA and chair follow) Assistive device: Walker-rolling   Walk 50 feet activity   Assist Walk 50 feet with 2 turns activity did not occur: Safety/medical concerns         Walk 150 feet activity   Assist Walk 150 feet activity did not occur: Safety/medical concerns         Walk 10 feet on  uneven surface  activity   Assist Walk 10 feet on uneven surfaces activity did not occur: Safety/medical concerns         Wheelchair     Assist Will patient use wheelchair at discharge?: No             Wheelchair 50 feet with 2 turns activity    Assist            Wheelchair 150 feet activity     Assist           Medical Problem List and Plan: 1.  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    Continue CIR 2.  Antithrombotics: -DVT/anticoagulation:  Pharmaceutical: Other (comment)--now on Eliquis.              -antiplatelet therapy: N/A 3. Pain Management: Tylenol prn. Well controlled.  4. Mood: LCSW to follow for evaluation and support.              -antipsychotic agents: N/A 5. Neuropsych: This patient is capable of making decisions on her own behalf. 6. Skin/Wound Care: Routine pressure relief measures.  7. Fluids/Electrolytes/Nutrition: Monitor I/Os.               BMP within acceptable range on 11/4 8. HTN: Monitor BP tid--now on coreg and Entresto. Avoid LBP due to L-PCA stenosis  Orthostatics ordered             Monitor with increased mobility 9. A fib s/p cardioversion: Monitor HR tid--on coreg bid, amiodarone and Xarelto.              Monitor with increased exertion  11/5: Bradycardic: decrease Coreg to 12.5 BID 10. T2DM with hyperglycemia: Hgb A1c- 7.2. Monitor BS ac/hs. Diabetic education  11/5: CBGs are mildly elevated.              Monitor with increased mobility 11. Breast cancer: Arimidex--diagnosed in August but not had follow up due to multiple admissions secondary to cardiac issues. Family has been requesting referral to Hem/onc/surgeon in town--? Referral sent.  12. ABLA  Hb 10.9 on  11/4  Cont to monitor  LOS: 2 days A FACE TO FACE EVALUATION WAS PERFORMED  KClide DeutscherRaulkar 04/03/2020, 11:41 AM

## 2020-04-03 NOTE — Progress Notes (Signed)
Speech Language Pathology Daily Session Note  Patient Details  Name: Courtney Coleman MRN: [id number] Date of Birth: Aug 30, 1965  Today's Date: 04/03/2020 SLP Individual Time: 1400-1500 SLP Individual Time Calculation (min): 60 min  Short Term Goals: Week 1: SLP Short Term Goal 1 (Week 1): Pt will use word finding strategies during structured speech tasks with Supervision A cues SLP Short Term Goal 2 (Week 1): Pt will use word finding strategies in informal/unstructured conversation with mildly complex language with Min A cues SLP Short Term Goal 3 (Week 1): Pt will write at the sentence level with 80% accuracy and Supervision A level cues for correcting spelling errors SLP Short Term Goal 4 (Week 1): Pt will identify verbal and/or written errors with Min A cues  Skilled Therapeutic Interventions:   Patient seen with significant other present for education and recommendations regarding speech-language strategies and exercises. Patient completed analogy exercise with 80% accuracy and initially moderate fading to minA cues from SLP. She completed sentence correction exercise with min-modA for 90% accuracy. She had difficulty with verbally summarizing after reading short story and required initially mod cues, fading to min-mod A cues. She is right handed and did attempt to write with left hand (non-dominant) but was having significant difficulty with mechanics of this and so SLP switched task to verbal production of sentences to describe. Patient able to describe object function at phrase level with minA for 90% accuracy. Patient and significant other very receptive to and understanding of suggestions to work on her language abilities. Patient continues to benefit from skilled SLP intervention to maximize language function prior to discharge.  Pain Pain Assessment Pain Scale: 0-10 Pain Score: 0-No pain  Therapy/Group: Individual Therapy  Sonia Baller, MA, CCC-SLP Speech Therapy

## 2020-04-03 NOTE — Plan of Care (Signed)
  Problem: RH BOWEL ELIMINATION Goal: RH STG MANAGE BOWEL WITH ASSISTANCE Description: STG Manage Bowel with Assistance. Outcome: Progressing   Problem: RH BLADDER ELIMINATION Goal: RH STG MANAGE BLADDER WITH ASSISTANCE Description: STG Manage Bladder With modi Assistance Outcome: Progressing

## 2020-04-03 NOTE — Progress Notes (Signed)
Physical Therapy Session Note  Patient Details  Name: Courtney Coleman MRN: [id number] Date of Birth: 10/22/1965  Today's Date: 04/03/2020 PT Individual Time: 0800-0900 PT Individual Time Calculation (min): 60 min   Short Term Goals: Week 1:  PT Short Term Goal 1 (Week 1): Pt will complete bed mobility with minA PT Short Term Goal 2 (Week 1): Pt will perform bed<>chair transfers with minA and LRAD PT Short Term Goal 3 (Week 1): Pt will ambulate 70ft with minA and LRAD PT Short Term Goal 4 (Week 1): Pt will negotiate up/down x4 steps with modA and LRAD  Skilled Therapeutic Interventions/Progress Updates:    Pt received supine in bed, awake and motivated to participate in PT session; no reports of pain. Retrieved scrub pants/shirt as pt did not have any clean clothes in room. TED's donned with totalA while supine in bed. Performed supine<>sit with modA for RLE and trunk management; able to sit EOB with supervision and no BUE support. Donned scrub pants in sitting with modA for threading and pulling over hips while standing. However, after standing, pt reporting saturated brief. Therefore, returned to sitting and removed scrub pants and dirty brief with maxA for time management. Provided her with soaped washcloths where she was able to perform front and posterior pericare with setupA while she used her LUE. Applied new brief and scrubs pants with maxA for time management. She required minA for donning scrub t-shirt in seated position. Stand<>pivot transfer with modA and no AD, towards weaker R side, from EOB to w/c. Focused remainder of session on gait training. W/c transport for time management from her room to day room gym, wheeled in front of [place]. Educated patient on LiteGait machine and treadmill combo, she was agreeable. Applied harness in seated position with totalA. Sit<>stand with modA from w/c to [place]. Required modA for stepping up on to treadmill due to R knee buckling. While on  treadmill, she ambulated 154ft with partial weight bearing support through harness/sling, at 0.3 mph. Therapist assisting with facilitation of RLE swing, R foot placement, and lateral weight shift. Pt demo's step-to gait pattern with premature stepping of L foot. She also has significant decreased step height in BLE's, almost dragging both of them. Provided targets for her to step over on L foot while therapist assisting R foot; good carryover. Required modA +2, for safety, for stepping off of treadmill and removing harness. W/c transport back to her room for time management. Performed sit<>stand with min/mod and R knee block to place chair alarm. She ended session with Fiance at bedside, seated in w/c, chair alarm on, needs in reach.  Therapy Documentation Precautions:  Precautions Precautions: Fall Precaution Comments: R hemiparesis, orthostasis Restrictions Weight Bearing Restrictions: No  Therapy/Group: Individual Therapy  Jory Welke P Glori Machnik PT 04/03/2020, 9:44 AM

## 2020-04-04 ENCOUNTER — Inpatient Hospital Stay (HOSPITAL_COMMUNITY): Payer: No Typology Code available for payment source | Admitting: Occupational Therapy

## 2020-04-04 ENCOUNTER — Inpatient Hospital Stay (HOSPITAL_COMMUNITY): Payer: No Typology Code available for payment source

## 2020-04-04 ENCOUNTER — Inpatient Hospital Stay (HOSPITAL_COMMUNITY): Payer: No Typology Code available for payment source | Admitting: Physical Therapy

## 2020-04-04 LAB — GLUCOSE, CAPILLARY
Glucose-Capillary: 105 mg/dL — ABNORMAL HIGH (ref 70–99)
Glucose-Capillary: 123 mg/dL — ABNORMAL HIGH (ref 70–99)
Glucose-Capillary: 170 mg/dL — ABNORMAL HIGH (ref 70–99)
Glucose-Capillary: 129 mg/dL — ABNORMAL HIGH (ref 70–99)

## 2020-04-04 NOTE — Progress Notes (Signed)
[place] PHYSICAL MEDICINE & REHABILITATION PROGRESS NOTE  Subjective/Complaints: No complaints this morning. Denies pain, constipation, insomnia.  Working very well with therapy BP is elevated  ROS: Denies CP, SOB, N/V/D  Objective: Vital Signs: Blood pressure (!) 161/80, pulse (!) 55, temperature 98.5 F (36.9 C), temperature source Oral, resp. rate 18, height 5\' 6"  (1.676 m), weight 85.3 kg, SpO2 98 %. No results found. Recent Labs    04/02/20 0438  WBC 6.8  HGB 10.9*  HCT 34.5*  PLT 249   Recent Labs    04/02/20 0438  NA 139  K 3.6  CL 106  CO2 23  GLUCOSE 114*  BUN 12  CREATININE 0.65  CALCIUM 9.0   No intake or output data in the 24 hours ending 04/04/20 1252      Physical Exam: BP (!) 161/80 (BP Location: Left Arm)   Pulse (!) 55   Temp 98.5 F (36.9 C) (Oral)   Resp 18   Ht 5\' 6"  (1.676 m)   Wt 85.3 kg   SpO2 98%   BMI 30.35 kg/m    General: Alert and oriented x 3, No apparent distress HEENT: Head is normocephalic, atraumatic, PERRLA, EOMI, sclera anicteric, oral mucosa pink and moist, dentition intact, ext ear canals clear,  Neck: Supple without JVD or lymphadenopathy Heart: Reg rate and rhythm. No murmurs rubs or gallops Chest: CTA bilaterally without wheezes, rales, or rhonchi; no distress Abdomen: Soft, non-tender, non-distended, bowel sounds positive. Extremities: No clubbing, cyanosis, or edema. Pulses are 2+  Psych: Normal mood.  Normal behavior. Musc: No edema in extremities.  No tenderness in extremities. Neuro: Alert Mild right facial weakness Motor: LUE/LLE: 5/5 proximal distal RUE: 4/5 proximal distal with apraxia, unchanged RLE: 4-4+/5 proximal distal, unchanged  Assessment/Plan: 1. 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  Physiatrist is providing close team supervision and 24 hour management of active medical problems listed below.  Physiatrist and rehab team continue to assess barriers to discharge/monitor patient progress toward functional and medical goals   Care Tool:  Bathing    Body parts bathed by patient: Right arm, Chest, Abdomen, Right upper leg, Left upper leg, Face   Body parts bathed by helper: Left arm, Front perineal area, Buttocks, Right lower leg, Left lower leg     Bathing assist Assist Level: Moderate Assistance - Patient 50 - 74%     Upper Body Dressing/Undressing Upper body dressing   What is the patient wearing?: Pull over shirt    Upper body assist Assist Level: Maximal Assistance - Patient 25 - 49%    Lower Body Dressing/Undressing Lower body dressing      What is the patient wearing?: Incontinence brief     Lower body assist Assist for lower body dressing: 2 Helpers     Toileting Toileting    Toileting assist Assist for toileting: 2 Helpers     Transfers Chair/bed transfer  Transfers assist     Chair/bed transfer assist level: Moderate Assistance - Patient 50 - 74%     Locomotion Ambulation   Ambulation assist      Assist level: 2 helpers (modA with chair follow) Assistive device: Walker-rolling Max distance: 13ft   Walk 10 feet activity   Assist     Assist level: 2 helpers (  modA and chair follow) Assistive device: Walker-rolling   Walk 50 feet activity   Assist Walk 50 feet with 2 turns activity did not occur: Safety/medical concerns         Walk 150 feet activity   Assist Walk 150 feet activity did not occur: Safety/medical concerns         Walk 10 feet on uneven surface  activity   Assist Walk 10 feet on uneven surfaces activity did not occur: Safety/medical concerns         Wheelchair     Assist Will patient use wheelchair at discharge?: No              Wheelchair 50 feet with 2 turns activity    Assist            Wheelchair 150 feet activity     Assist           Medical Problem List and Plan: 1.  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    Continue CIR 2.  Antithrombotics: -DVT/anticoagulation:  Pharmaceutical: Other (comment)--now on Eliquis.              -antiplatelet therapy: N/A 3. Pain Management: Tylenol prn. Well controlled 4. Mood: LCSW to follow for evaluation and support.              -antipsychotic agents: N/A 5. Neuropsych: This patient is capable of making decisions on her own behalf. 6. Skin/Wound Care: Routine pressure relief measures.  7. Fluids/Electrolytes/Nutrition: Monitor I/Os.               BMP within acceptable range on 11/4 8. HTN: Monitor BP tid--now on coreg and Entresto. Avoid LBP due to L-PCA stenosis  Orthostatics ordered             Monitor with increased mobility  BP elevated- avoiding hypotension 9. A fib s/p cardioversion: Monitor HR tid--on coreg bid, amiodarone and Xarelto.              Monitor with increased exertion  11/5: Bradycardic: decrease Coreg to 12.5 BID 10. T2DM with hyperglycemia: Hgb A1c- 7.2. Monitor BS ac/hs. Diabetic education  11/5-11/6: CBGs are mildly elevated: high of 161             Monitor with increased mobility 11. Breast cancer: Arimidex--diagnosed in August but not had follow up due to multiple admissions secondary to cardiac issues. Family has been requesting referral to Hem/onc/surgeon in town--? Referral sent.  12. ABLA  Hb 10.9 on 11/4  Cont to monitor  LOS: 3 days A FACE TO FACE EVALUATION WAS PERFORMED  Clide Deutscher Shericka Johnstone 04/04/2020, 12:52 PM

## 2020-04-04 NOTE — Progress Notes (Signed)
Occupational Therapy Session Note  Patient Details  Name: Courtney Coleman MRN: [id number] Date of Birth: January 26, 1966  Today's Date: 04/04/2020 OT Individual Time: 4935-5217 OT Individual Time Calculation (min): 15 min    Short Term Goals: Week 1:  OT Short Term Goal 1 (Week 1): Pt will recall functional place to put right UE during grooming tasks with min questioning cues OT Short Term Goal 2 (Week 1): Pt will perform sit to stands with contact guard with recall with min A for feet placement OT Short Term Goal 3 (Week 1): Pt will don shirt with min A with hemi dressing techniques OT Short Term Goal 4 (Week 1): Pt will thread underwear and pants with min A with extra time  Skilled Therapeutic Interventions/Progress Updates:    1:1 Self care retraining at EOB with focus on dressing. Continue work on dynamic sitting balance, functional use of right hand for grasp and release, hemi dressing, sit to stands, standing balance without UE support. In standing continued practice with attention to right knee control and weight shifts. Pt's right UE functional continues to improved on the daily with continued practice. More proximal movement than distal control. Left sitting in the w/c with fiancee present.   Therapy Documentation Precautions:  Precautions Precautions: Fall Precaution Comments: R hemiparesis, orthostasis Restrictions Weight Bearing Restrictions: No General:   Vital Signs: Therapy Vitals Pulse Rate: (!) 55 Pain: Pain Assessment Pain Score: 0-No pain   Therapy/Group: Individual Therapy  Willeen Cass [facility] 04/04/2020, 12:28 PM

## 2020-04-04 NOTE — Progress Notes (Signed)
Speech Language Pathology Daily Session Note  Patient Details  Name: Sunya Humbarger MRN: [id number] Date of Birth: 1965/12/01  Today's Date: 04/04/2020 SLP Individual Time: 1010-1055 SLP Individual Time Calculation (min): 45 min  Short Term Goals: Week 1: SLP Short Term Goal 1 (Week 1): Pt will use word finding strategies during structured speech tasks with Supervision A cues SLP Short Term Goal 2 (Week 1): Pt will use word finding strategies in informal/unstructured conversation with mildly complex language with Min A cues SLP Short Term Goal 3 (Week 1): Pt will write at the sentence level with 80% accuracy and Supervision A level cues for correcting spelling errors SLP Short Term Goal 4 (Week 1): Pt will identify verbal and/or written errors with Min A cues  Skilled Therapeutic Interventions:Skilled ST services focused on education and language skills. SLP facilitated word finding strategies in convergent naming tasks, simple/general categorization pt demonstrated 83% accuracy in 12 opportunities, however in naming subcategories was only able to name 1 out 5 opportunities and 3 out 5 with max A semantic, phonemic and sentence completion cues. Pt demonstrated ability to name 10 animals in 1 minute interval in divergent naming task and following instruction of subcategorization and visualization strategies, pt was able to name 11 animals. Pt demonstrates greater impairment in word finding in structured tasks verse conversation, in simple conversation pt only required supervision A semantic cues. Pt's family entered session during the last 10 minutes. Education was provided to pt and family member pertaining to anomia, purpose of treatment and strategies to aid in word finding during conversation. All questions answered to satisfaction. Pt was left in room with family, call bell within reach and chair alarm set. SLP recommends to continue skilled services.     Pain Pain Assessment Pain Scale:  0-10 Pain Score: 0-No pain  Therapy/Group: Individual Therapy  Mabry Santarelli  [facility] 04/04/2020, 12:20 PM

## 2020-04-04 NOTE — Progress Notes (Signed)
Dr Adam Phenix made aware of held coreg this AM for low HR. Advised to continue to hold. No complications noted. Pt sitting in chair with family at bedside. Sheela Stack, LPN

## 2020-04-04 NOTE — Progress Notes (Signed)
Physical Therapy Session Note  Patient Details  Name: Courtney Coleman MRN: [id number] Date of Birth: 1966-01-15  Today's Date: 04/04/2020 PT Individual Time: 0813-0900 and [id number] PT Individual Time Calculation (min): 47 min and 77 min  Short Term Goals: Week 1:  PT Short Term Goal 1 (Week 1): Pt will complete bed mobility with minA PT Short Term Goal 2 (Week 1): Pt will perform bed<>chair transfers with minA and LRAD PT Short Term Goal 3 (Week 1): Pt will ambulate 29ft with minA and LRAD PT Short Term Goal 4 (Week 1): Pt will negotiate up/down x4 steps with modA and LRAD  Skilled Therapeutic Interventions/Progress Updates:    Session 1: Pt received supine in bed with her fiance present and pt agreeable to therapy session. Pt's fiance reports he plans to bring her tennis shoes tomorrow, Sunday. Supine>sitting L EOB, HOB elevated and using bedrail, with CGA and cuing for increased independent movement of R LE and able to perform with increased time. Sitting EOB donned shirt with mod assist; TED hose, socks, and pants with max assist for threading for time management. Pt is able to activate minimal R ankle DF movement while donning LB clothing! Sit>stand EOB>L UE support on bedrail with min assist (demos L LE weight shift bias/compensation) - standing with min assist for balance while pulling up pants over hips with max assist. L stand pivot to w/c with min/mod assist for balance (demos lack of full R wt shift during transfer). RN present for medication administration. Standing at sink with B UE support focusing on R/L weight shift and R LE NMR for stance control increasing hip/knee extensor activation progressed to pre-gait stepping L LE forward/backwards - therapist guarding for R knee buckle but blocking hyperextension - min assist for balance.  Transported to/from gym in w/c for time management and energy conservation. Gait training 75ft at L hallway rail transitioned to 3 Musketeer support with +2  mod assist for balance to ambulate an additional 26ft - pt demos ability to advance R LE during swing though has excessive hip adduction requiring max assist to reposition prior to initiating stance  - lacks R foot clearance during swing due to lack of hip/knee flexion activation  - demos primarily R knee hyperextension during stance (no true buckling) despite therapist blocking hyperextension - cuing throughout for improved trunk/hip extension and R weight shift during stance. Gait training an additional ~87ft and ~57ft via 3 Musketeer support with +2 mod assist as described. Pt left seated on EOB in care of Jenn, OT at end of session.  Session 2: Pt received supine in bed with her fiance present and pt agreeable to therapy session. Supine>sitting L EOB, HOB elevated and using bedrails, with pt using UEs to assist with R LE off EOB. Sitting EOB donned shirt with min assist and pants with max assist for threading LEs for time management. Pt noted to be incontinent of bladder in brief. Sit<>stand using L UE support on bedrail with min assist for standing and min/mod assist for balance while pt performed peri-care with set-up assist - donned clean brief total assist and pulled up pants max assist. R squat pivot EOB>w/c with mod assist for lifting/pivoting hips to force R LE Wbing.  Transported to/from gym in w/c for time management and energy conservation. R squat pivot w/c>EOM with min/mod assist due to improved motor planning. Sit>supine with CGA for steadying and increased time for R LE management onto mat but no assist. Supine bridging 2x15reps with therapist keeping  R foot from sliding, as needed - 2nd set had R LE bias for increased muscle activation - cuing throughout for keeping R knee from falling in/out and keeping pelvis level with pt having only mild difficulty and demoed excellent hip clearance. Supine R LE heel slides x10 reps focusing on hip/knee flexion then therapist returning leg to extended  position to reset. Supine>sitting R EOM with CGA for trunk steadying - demos good incorporation of R UE into this task to assist with trunk upright. Repeated sit<>stands to/from EOM x10 reps with min/mod assist for balance - mirror feedback and cuing for increased RLE WBing. Dynamic standing balance task via cross body reaching from low/high surfaces with R UE requiring squat position with R LE WBing - pt able to raise horeshoe up with R UE but not able to sustain strong enough grasp to keep the horseshoe positioned in her hand nor rotate wrist to place it on top of mirror - min/mod assist for balance and cuing for increased safety. Standing tolerance and R UE NMR placing large wooden PEGs into board with min assist for balance - repeated cuing throughout to maintain midline weight shift and sustain R LE extensor muscle activation - able to place ~5 PEGs in ~62minutes. L squat pivot EOM>w/c with min assist for pivoting hips and facilitating R LE WBing. Transported back to room and pt requesting for assist to bed. L squat pivot as just described. Sit>supine with min assist for R LE management onto bed. Pt left supine in bed with needs in reach and bed alarm on.  Therapy Documentation Precautions:  Precautions Precautions: Fall Precaution Comments: R hemiparesis, orthostasis Restrictions Weight Bearing Restrictions: No  Pain:   Session 1:  Denies pain during session.  Session 2: Denies pain during session.  Therapy/Group: Individual Therapy  Tawana Scale , PT, DPT, CSRS  04/04/2020, 9:02 AM

## 2020-04-05 LAB — GLUCOSE, CAPILLARY
Glucose-Capillary: 102 mg/dL — ABNORMAL HIGH (ref 70–99)
Glucose-Capillary: 98 mg/dL (ref 70–99)
Glucose-Capillary: 99 mg/dL (ref 70–99)
Glucose-Capillary: 119 mg/dL — ABNORMAL HIGH (ref 70–99)
Glucose-Capillary: 151 mg/dL — ABNORMAL HIGH (ref 70–99)

## 2020-04-05 MED ORDER — HYDRALAZINE HCL 10 MG PO TABS: 10.0000 mg | ORAL_TABLET | Freq: Three times a day (TID) | ORAL | Status: DC

## 2020-04-05 MED ORDER — HYDRALAZINE HCL 10 MG PO TABS
10.0000 mg | ORAL_TABLET | Freq: Three times a day (TID) | ORAL | Status: DC
  Administered 2020-04-05 – 2020-04-22 (×51): 10 mg via ORAL
  Filled 2020-04-05 (×50): qty 1

## 2020-04-05 NOTE — Progress Notes (Addendum)
[facility] PHYSICAL MEDICINE & REHABILITATION PROGRESS NOTE  Subjective/Complaints: Bp has been very high to systolic 409B. Dizziness wit therapy is getting better- will start hydralazine 10mg  TID to help with BP without dropping HR (dropped to 48 and so Coreg has been held)- would benefit from cardiology f/u this week.   ROS: Denies CP, SOB, N/V/D  Objective: Vital Signs: Blood pressure (!) 187/94, pulse (!) 52, temperature 98.4 F (36.9 C), temperature source Oral, resp. rate 18, height 5\' 6"  (1.676 m), weight 106.7 kg, SpO2 99 %. No results found. No results for input(s): WBC, HGB, HCT, PLT in the last 72 hours. No results for input(s): NA, K, CL, CO2, GLUCOSE, BUN, CREATININE, CALCIUM in the last 72 hours. No intake or output data in the 24 hours ending 04/05/20 3532   Physical Exam: BP (!) 187/94 (BP Location: Left Arm)   Pulse (!) 52   Temp 98.4 F (36.9 C) (Oral)   Resp 18   Ht 5\' 6"  (1.676 m)   Wt 106.7 kg   SpO2 99%   BMI 37.97 kg/m  , General: Alert and oriented x 3, No apparent distress HEENT: Head is normocephalic, atraumatic, PERRLA, EOMI, sclera anicteric, oral mucosa pink and moist, dentition intact, ext ear canals clear,  Neck: Supple without JVD or lymphadenopathy Heart: Reg rate and rhythm. No murmurs rubs or gallops Chest: CTA bilaterally without wheezes, rales, or rhonchi; no distress Abdomen: Soft, non-tender, non-distended, bowel sounds positive. Extremities: No clubbing, cyanosis, or edema. Pulses are 2+ Skin: Left breast with nipple darkening as compared to right Psych: Normal mood.  Normal behavior. Musc: No edema in extremities.  No tenderness in extremities. Neuro: Alert Mild right facial weakness Motor: LUE/LLE: 5/5 proximal distal RUE: 4/5 proximal distal with apraxia, unchanged RLE: 4-4+/5 proximal distal, unchanged  Assessment/Plan: 1. 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  Physiatrist is providing close team supervision and 24 hour management of active medical problems listed below.  Physiatrist and rehab team continue to assess barriers to discharge/monitor patient progress toward functional and medical goals   Care Tool:  Bathing    Body parts bathed by patient: Right arm, Chest, Abdomen, Right upper leg, Left upper leg, Face   Body parts bathed by helper: Left arm, Front perineal area, Buttocks, Right lower leg, Left lower leg     Bathing assist Assist Level: Moderate Assistance - Patient 50 - 74%     Upper Body Dressing/Undressing Upper body dressing   What is the patient wearing?: Pull over shirt    Upper body assist Assist Level: Maximal Assistance - Patient 25 - 49%    Lower Body Dressing/Undressing Lower body dressing      What is the patient wearing?: Incontinence brief     Lower body assist Assist for lower body dressing: Maximal Assistance - Patient 25 - 49%     Toileting Toileting    Toileting assist Assist for toileting: Maximal Assistance - Patient 25 - 49%     Transfers Chair/bed transfer  Transfers assist     Chair/bed transfer assist level: Minimal Assistance - Patient > 75% Chair/bed transfer assistive device: Bedrails, Armrests   Locomotion Ambulation   Ambulation assist      Assist level: 2 helpers Assistive device: Other (comment) (3 Musketeer) Max  distance: 74ft   Walk 10 feet activity   Assist     Assist level: 2 helpers Assistive device: Other (comment) (3 Musketeer)   Walk 50 feet activity   Assist Walk 50 feet with 2 turns activity did not occur: Safety/medical concerns  Assist level: 2 helpers Assistive device: Other (comment) (3 Musketeer)    Walk 150 feet activity   Assist Walk 150 feet  activity did not occur: Safety/medical concerns         Walk 10 feet on uneven surface  activity   Assist Walk 10 feet on uneven surfaces activity did not occur: Safety/medical concerns         Wheelchair     Assist Will patient use wheelchair at discharge?: No             Wheelchair 50 feet with 2 turns activity    Assist            Wheelchair 150 feet activity     Assist           Medical Problem List and Plan: 1.  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    Continue CIR 2.  Antithrombotics: -DVT/anticoagulation:  Pharmaceutical: Other (comment)--now on Eliquis.              -antiplatelet therapy: N/A 3. Pain Management: Tylenol prn. Well controlled 4. Mood: LCSW to follow for evaluation and support.              -antipsychotic agents: N/A 5. Neuropsych: This patient is capable of making decisions on her own behalf. 6. Skin/Wound Care: Routine pressure relief measures.  7. Fluids/Electrolytes/Nutrition: Monitor I/Os.               BMP within acceptable range on 11/4 8. HTN: Monitor BP tid--now on coreg and Entresto. Avoid LBP due to L-PCA stenosis  Orthostatics ordered- dizziness has much improved with therapy             Monitor with increased mobility  BP elevated- avoiding hypotension 9. A fib s/p cardioversion: Monitor HR tid--on coreg bid, amiodarone and Xarelto.              Monitor with increased exertion  11/5: Bradycardic: decrease Coreg to 12.5 BID  11/7: Patient would benefit from cardiology follow-up this week as se is not receiving Coreg due to low HR. I have added Hydralazine 10mg  TID for her BP 10. T2DM with hyperglycemia: Hgb A1c- 7.2. Monitor BS ac/hs. Diabetic  education  11/5-11/6: CBGs are mildly elevated: high of 161             Monitor with increased mobility 11. Breast cancer: Arimidex--diagnosed in August but not had follow up due to multiple admissions secondary to cardiac issues. Family has been requesting referral to Hem/onc/surgeon in town--? Referral sent.  12. ABLA  Hb 10.9 on 11/4  Cont to monitor  35 minutes spent in discussion of HR, BP, breast cancer, therapy, pain, physical examination, discussion of change in medications  LOS: 4 days A FACE TO FACE EVALUATION WAS PERFORMED  Emberleigh Reily P Leyli Kevorkian 04/05/2020, 9:22 AM

## 2020-04-06 ENCOUNTER — Inpatient Hospital Stay (HOSPITAL_COMMUNITY): Payer: No Typology Code available for payment source | Admitting: Occupational Therapy

## 2020-04-06 ENCOUNTER — Inpatient Hospital Stay (HOSPITAL_COMMUNITY): Payer: No Typology Code available for payment source

## 2020-04-06 LAB — CBC
HCT: 35.6 % — ABNORMAL LOW (ref 36.0–46.0)
Hemoglobin: 11 g/dL — ABNORMAL LOW (ref 12.0–15.0)
MCH: 25.6 pg — ABNORMAL LOW (ref 26.0–34.0)
MCHC: 30.9 g/dL (ref 30.0–36.0)
MCV: 82.8 fL (ref 80.0–100.0)
Platelets: 270 10*3/uL (ref 150–400)
RBC: 4.3 MIL/uL (ref 3.87–5.11)
RDW: 19.8 % — ABNORMAL HIGH (ref 11.5–15.5)
WBC: 6 10*3/uL (ref 4.0–10.5)
nRBC: 0 % (ref 0.0–0.2)

## 2020-04-06 LAB — BASIC METABOLIC PANEL
Anion gap: 9 (ref 5–15)
BUN: 12 mg/dL (ref 6–20)
CO2: 26 mmol/L (ref 22–32)
Calcium: 9.2 mg/dL (ref 8.9–10.3)
Chloride: 103 mmol/L (ref 98–111)
Creatinine, Ser: 0.84 mg/dL (ref 0.44–1.00)
GFR, Estimated: >60 mL/min (ref >60)
Glucose, Bld: 115 mg/dL — ABNORMAL HIGH (ref 70–99)
Potassium: 3.7 mmol/L (ref 3.5–5.1)
Sodium: 138 mmol/L (ref 135–145)

## 2020-04-06 LAB — GLUCOSE, CAPILLARY
Glucose-Capillary: 113 mg/dL — ABNORMAL HIGH (ref 70–99)
Glucose-Capillary: 123 mg/dL — ABNORMAL HIGH (ref 70–99)
Glucose-Capillary: 110 mg/dL — ABNORMAL HIGH (ref 70–99)
Glucose-Capillary: 124 mg/dL — ABNORMAL HIGH (ref 70–99)

## 2020-04-06 MED ORDER — AMIODARONE HCL 200 MG PO TABS
100.0000 mg | ORAL_TABLET | Freq: Every day | ORAL | Status: DC
  Administered 2020-04-07 – 2020-04-22 (×16): 100 mg via ORAL
  Filled 2020-04-06 (×16): qty 1

## 2020-04-06 MED ORDER — SACUBITRIL-VALSARTAN 49-51 MG PO TABS
1.0000 | ORAL_TABLET | Freq: Two times a day (BID) | ORAL | Status: DC
  Administered 2020-04-06 – 2020-04-22 (×33): 1 via ORAL
  Filled 2020-04-06 (×33): qty 1

## 2020-04-06 NOTE — Progress Notes (Signed)
Occupational Therapy Session Note  Patient Details  Name: Courtney Coleman MRN: [id number] Date of Birth: 27-Sep-1965  Today's Date: 04/06/2020 OT Individual Time: 1303-1400 OT Individual Time Calculation (min): 57 min    Short Term Goals: Week 1:  OT Short Term Goal 1 (Week 1): Pt will recall functional place to put right UE during grooming tasks with min questioning cues OT Short Term Goal 2 (Week 1): Pt will perform sit to stands with contact guard with recall with min A for feet placement OT Short Term Goal 3 (Week 1): Pt will don shirt with min A with hemi dressing techniques OT Short Term Goal 4 (Week 1): Pt will thread underwear and pants with min A with extra time  Skilled Therapeutic Interventions/Progress Updates:    Pt sitting up in w/c, fiance present for support, pt with no c/o pain, requesting to toilet and shower during OT session.  Pt transported to 3 in 1 commode over toilet and completed stand pivot with min assist.  Pt completed toileting with min assist for clothing mgt.  Pt able to complete pericare in seated position with anterior lean.  Stand pivot 3 in 1 commode> w/c and w/c>TTB with min assist using grab bars.  Pt bathed UB and LB with min assist for back and bilateral feet.  Stand pivot TTB>w/c with min assist. Pt transported to sinkside and completed UB dressing with min assist and LB dressing with mod assist.  Pt donned socks with min assist to hold RLE in figure 4 position and VCs provided to encourage right hand use.  Pt able to incorporate right hand as an assist but difficulty with fine motor control, reporting impaired sensation in hand.  Pt transported to gym and participated in neuro re-ed right hand to complete resistive pinch, gross digital extension using resistive putty, and gross grasp/release of cup needing VCs for sequencing of movement and intermittent TCs to encourage motor planning.  Pt transported back to room, stand pivot completed w/c>recliner.  Seat belt  alarm on, call bell in reach.  Therapy Documentation Precautions:  Precautions Precautions: Fall Precaution Comments: R hemiparesis, orthostasis Restrictions Weight Bearing Restrictions: No   Therapy/Group: Individual Therapy  Ezekiel Slocumb 04/06/2020, 3:48 PM

## 2020-04-06 NOTE — Consult Note (Signed)
Reason for Consult:Marked bradycardia/uncontrolled hypertension Referring Physician:Dr. Lakiesha Coleman is an 54 y.o. female.  [name] is 54 year old female with past medical history significant for hypertension, history of atrial fibrillations, status post cardioversion in the past, history of congestive heart failure secondary to preserved LV systolic function, carcinoma of the breast, was admitted because of Right leg weakness and was noted to have multifocal acute ischemia within the dorsomedial and left thalamus and left cerebral peduncle and corpus callosum and left basal ganglia.  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  Cardiologic consultation is called as patient was noted to have marked bradycardia associated with hypertension despite discontinuing carvedilol.  Patient denies any chest pain or shortness of breath.  Patient denies any dizziness, lightheadedness or syncope.  Denies PND, orthopnea, leg swelling.  Patient had 2-D echo done which showed normal LV systolic function with diastolic dysfunction and mild to moderate aortic sclerosis with no evidence of stenosis. Past Medical History:  Diagnosis Date  . Breast cancer ([place])   . CHF (congestive heart failure) ([place])   . Dysrhythmia   . Hypertension     Past Surgical History:  Procedure Laterality Date  . BREAST BIOPSY      Family History  Problem Relation Age of Onset  . Heart disease Mother   . Diabetes Father   . Cancer Neg Hx     Social History:  reports that she has never smoked. She has never used smokeless tobacco. She reports previous alcohol use. She reports that she does not use drugs.  Allergies: No Known Allergies  Medications: I have reviewed the patient's current medications.  Results for orders placed or performed during the hospital encounter of 04/01/20 (from the past 48 hour(s))  Glucose, capillary     Status: Abnormal   Collection Time: 04/04/20   6:06 PM  Result Value Ref Range   Glucose-Capillary 170 (H) 70 - 99 mg/dL    Comment: Glucose reference range applies only to samples taken after fasting for at least 8 hours.  Glucose, capillary     Status: Abnormal   Collection Time: 04/04/20  9:11 PM  Result Value Ref Range   Glucose-Capillary 129 (H) 70 - 99 mg/dL    Comment: Glucose reference range applies only to samples taken after fasting for at least 8 hours.  Glucose, capillary     Status: Abnormal   Collection Time: 04/05/20  5:59 AM  Result Value Ref Range   Glucose-Capillary 102 (H) 70 - 99 mg/dL    Comment: Glucose reference range applies only to samples taken after fasting for at least 8 hours.  Glucose, capillary     Status: None   Collection Time: 04/05/20 11:32 AM  Result Value Ref Range   Glucose-Capillary 99 70 - 99 mg/dL    Comment: Glucose reference range applies only to samples taken after fasting for at least 8 hours.  Glucose, capillary     Status: None   Collection Time: 04/05/20 12:17 PM  Result Value Ref Range   Glucose-Capillary 98 70 - 99 mg/dL    Comment: Glucose reference range applies only to samples taken after fasting for at least 8 hours.  Glucose, capillary     Status: Abnormal   Collection Time: 04/05/20  5:28 PM  Result Value Ref Range   Glucose-Capillary 119 (H) 70 - 99 mg/dL    Comment: Glucose reference range applies only to samples taken after fasting for at least  8 hours.  Glucose, capillary     Status: Abnormal   Collection Time: 04/05/20  9:12 PM  Result Value Ref Range   Glucose-Capillary 151 (H) 70 - 99 mg/dL    Comment: Glucose reference range applies only to samples taken after fasting for at least 8 hours.  Glucose, capillary     Status: Abnormal   Collection Time: 04/06/20  6:10 AM  Result Value Ref Range   Glucose-Capillary 113 (H) 70 - 99 mg/dL    Comment: Glucose reference range applies only to samples taken after fasting for at least 8 hours.  CBC     Status: Abnormal    Collection Time: 04/06/20  7:53 AM  Result Value Ref Range   WBC 6.0 4.0 - 10.5 K/uL   RBC 4.30 3.87 - 5.11 MIL/uL   Hemoglobin 11.0 (L) 12.0 - 15.0 g/dL   HCT 35.6 (L) 36 - 46 %   MCV 82.8 80.0 - 100.0 fL   MCH 25.6 (L) 26.0 - 34.0 pg   MCHC 30.9 30.0 - 36.0 g/dL   RDW 19.8 (H) 11.5 - 15.5 %   Platelets 270 150 - 400 K/uL   nRBC 0.0 0.0 - 0.2 %    Comment: Performed at [facility] [address], [place], [place] [address]  Glucose, capillary     Status: Abnormal   Collection Time: 04/06/20 11:57 AM  Result Value Ref Range   Glucose-Capillary 123 (H) 70 - 99 mg/dL    Comment: Glucose reference range applies only to samples taken after fasting for at least 8 hours.    No results found.  Review of Systems  Constitutional: Negative for chills and diaphoresis.  HENT: Negative for sore throat.   Eyes: Negative for pain.  Respiratory: Negative for chest tightness and shortness of breath.   Cardiovascular: Negative for chest pain and leg swelling.  Gastrointestinal: Negative for abdominal distention and abdominal pain.  Genitourinary: Negative for difficulty urinating.  Skin: Negative for color change.  Neurological: Negative for dizziness, seizures and headaches.   Blood pressure (!) 194/82, pulse (!) 55, temperature 98.1 F (36.7 C), temperature source Oral, resp. rate 18, height 5\' 6"  (1.676 m), weight 106 kg, SpO2 97 %. Physical Exam Constitutional:      Appearance: Normal appearance.  HENT:     Head: Normocephalic and atraumatic.  Eyes:     Extraocular Movements: Extraocular movements intact.     Conjunctiva/sclera: Conjunctivae normal.     Pupils: Pupils are equal, round, and reactive to light.  Cardiovascular:     Rate and Rhythm: Regular rhythm. Bradycardia present.     Heart sounds: Murmur (soft systolic murmur noted.  No S3 gallop) heard.   Pulmonary:     Breath sounds: Normal breath sounds.  Abdominal:     General: Abdomen is flat. There is no  distension.     Palpations: Abdomen is soft. There is no mass.  Musculoskeletal:     Cervical back: Normal range of motion. Rigidity present. No tenderness.  Skin:    General: Skin is warm and dry.  Neurological:     Mental Status: She is alert and oriented to person, place, and time. Mental status is at baseline.     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 Marked sinus bradycardia secondary to medications Uncontrolled hypertension. Non-insulin-dependent diabetes mellitus History of paroxysmal A. Fib on chronic anticoagulation  Chadv[place]  score of 5 History  of cardioversion in the past. Hyperlipidemia. Morbid obesity. Carcinoma of the left breast Plan Agree with holding carvedilol for now. Reduce amiodarone to 100 mg daily Increase  Entresto to 49/51 mg twice daily We will add amlodipine 5 mg daily if blood pressure still remains elevated Courtney Coleman 04/06/2020, 12:25 PM

## 2020-04-06 NOTE — Progress Notes (Signed)
Speech Language Pathology Daily Session Note  Patient Details  Name: Courtney Coleman MRN: [id number] Date of Birth: April 04, 1966  Today's Date: 04/06/2020 SLP Individual Time: 1003-1100 SLP Individual Time Calculation (min): 57 min  Short Term Goals: Week 1: SLP Short Term Goal 1 (Week 1): Pt will use word finding strategies during structured speech tasks with Supervision A cues SLP Short Term Goal 2 (Week 1): Pt will use word finding strategies in informal/unstructured conversation with mildly complex language with Min A cues SLP Short Term Goal 3 (Week 1): Pt will write at the sentence level with 80% accuracy and Supervision A level cues for correcting spelling errors SLP Short Term Goal 4 (Week 1): Pt will identify verbal and/or written errors with Min A cues  Skilled Therapeutic Interventions:Skilled ST services focused on language skills. Pt demonstrated ability to describe mildly complex pictures with supervision A verbal cues for sentence structure only. SLP facilitated word finding skills in mildly complex naming task given 3 associated words, pt demonstrated accuracy in 5 out 10 opportunities and 10 out 10 with mod A semantic and sentence completion cues. Pt demonstrated mildly complex convergent naming in 6 out 10 opportunities and 10 out 10 with mod A semantic cues and was able to name synonyms in 6 out 10 opportunities and in 10 out 10 opportunities with mod A semantic and sentence completion cues. SLP also facilitated word finding skills in communication barrier task in which pt required mod A semantic cues. Pt was left in room with call bell within reach and bed/chair alarm set. SLP recommends to continue skilled services.     Pain Pain Assessment Pain Score: 0-No pain  Therapy/Group: Individual Therapy  Raynell Scott  [facility] 04/06/2020, 2:06 PM

## 2020-04-06 NOTE — Progress Notes (Signed)
Physical Therapy Session Note  Patient Details  Name: Courtney Coleman MRN: [id number] Date of Birth: [birth year]  Today's Date: 04/06/2020 PT Individual Time: 1100-1200 PT Individual Time Calculation (min): 60 min   Short Term Goals: Week 1:  PT Short Term Goal 1 (Week 1): Pt will complete bed mobility with minA PT Short Term Goal 2 (Week 1): Pt will perform bed<>chair transfers with minA and LRAD PT Short Term Goal 3 (Week 1): Pt will ambulate 60ft with minA and LRAD PT Short Term Goal 4 (Week 1): Pt will negotiate up/down x4 steps with modA and LRAD  Skilled Therapeutic Interventions/Progress Updates:    Pt received supine in bed, agreeable to PT session; reports no pain. Pt in gown on arrival; therefore, 1st part of session focused on self care and dressing. Performed supine<>sit with minA with HOB flat, assist for RLE management. Able to sit EOB with supervision. maxA for donning socks for time management. Pt reports saturated brief, required totalA for brief management. Provided soaped wash cloths where she was able to perform frontal pericare with setupA in sitting, required totalA for posterior care while she stood. ModA for donning pants and able to don shirt with minA. Performed squat<>pivot transfer from EOB to w/c, towards weaker R side with cues for sequencing, hand placement, and head/hip. W/c transport for time management from her room to main therapy gym. Performed interval gait training, 1x76ft + 1x125ft with min/modA and RW. Used R gutter guard splint to assist with RUE grasp to RW. With initial gait, pt with x1 large LOB to the R requiring maxA for recovery; LOB contributed to poor R proprioception and scissoring gait pattern. Gait deficits include R knee hyperextension vs buckling, narrow BOS, difficulty with RW management, forward flexed trunk, and intermittent R toe drag. Focused remainder of session on NMR for RLE in sitting position at edge of mat. Performed 1x10 sit<>stands with  minA with R knee block and cues for R knee stability and controlled lowering to sitting position. After rest break, performed standing with RW support and instructed to step over small 3lb dumbbell, focusing on R hip/knee flexion and hamstring activation. Pt with great difficulty completing this and would often knock over the dumbbell. Completed the same task in sitting to remove multi-tasking component, still had great difficulty. Due to time constraints, unable to isolate R hamstring facilitation but will continue efforts. Performed squat<>pivot with min/modA back to w/c from mat table and wheeled back to her room where she remained seated in w/c with needs in reach, chair alarm on, RUE propped with pillow.  Therapy Documentation Precautions:  Precautions Precautions: Fall Precaution Comments: R hemiparesis, orthostasis Restrictions Weight Bearing Restrictions: No  Therapy/Group: Individual Therapy  Reed Eifert P Deborah Dondero PT 04/06/2020, 7:43 AM

## 2020-04-06 NOTE — Progress Notes (Signed)
[place] PHYSICAL MEDICINE & REHABILITATION PROGRESS NOTE  Subjective/Complaints: Overall feeling well. Denies dizziness yesterday. No pain. bp up over the weekend. Anxious to get home  ROS: Patient denies fever, rash, sore throat, blurred vision, nausea, vomiting, diarrhea, cough, shortness of breath or chest pain, joint or back pain, headache, or mood change.    Objective: Vital Signs: Blood pressure (!) 172/87, pulse 60, temperature 98.1 F (36.7 C), resp. rate 19, height 5\' 6"  (1.676 m), weight 106 kg, SpO2 98 %. No results found. Recent Labs    04/06/20 0753  WBC 6.0  HGB 11.0*  HCT 35.6*  PLT 270   Recent Labs    04/06/20 1106  NA 138  K 3.7  CL 103  CO2 26  GLUCOSE 115*  BUN 12  CREATININE 0.84  CALCIUM 9.2    Intake/Output Summary (Last 24 hours) at 04/06/2020 1258 Last data filed at 04/06/2020 2376 Gross per 24 hour  Intake 240 ml  Output 100 ml  Net 140 ml     Physical Exam: BP (!) 172/87 (BP Location: Left Arm)   Pulse 60   Temp 98.1 F (36.7 C)   Resp 19   Ht 5\' 6"  (1.676 m)   Wt 106 kg   SpO2 98%   BMI 37.72 kg/m  , Constitutional: No distress . Vital signs reviewed. obese HEENT: EOMI, oral membranes moist Neck: supple Cardiovascular: RRR without murmur. No JVD    Respiratory/Chest: CTA Bilaterally without wheezes or rales. Normal effort    GI/Abdomen: BS +, non-tender, non-distended Ext: no clubbing, cyanosis, or edema Psych: pleasant and cooperative Skin: Left breast with nipple darkening as compared to right Musc: No edema in extremities.  No tenderness in extremities. Neuro: Alert Mild right facial weakness Motor: LUE/LLE: 5/5 proximal distal RUE: 4/5 proximal distal with apraxia  RLE: 4- to 4+/5 proximal distal   Assessment/Plan: 1. 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  Physiatrist is providing close team supervision and 24 hour management of active medical problems listed below.  Physiatrist and rehab team continue to assess barriers to discharge/monitor patient progress toward functional and medical goals   Care Tool:  Bathing    Body parts bathed by patient: Right arm, Chest, Abdomen, Right upper leg, Left upper leg, Face   Body parts bathed by helper: Left arm, Front perineal area, Buttocks, Right lower leg, Left lower leg     Bathing assist Assist Level: Moderate Assistance - Patient 50 - 74%     Upper Body Dressing/Undressing Upper body dressing   What is the patient wearing?: Pull over shirt    Upper body assist Assist Level: Maximal Assistance - Patient 25 - 49%    Lower Body Dressing/Undressing Lower body dressing      What is the patient wearing?: Incontinence brief     Lower body assist Assist for lower body dressing: Maximal Assistance - Patient 25 - 49%     Toileting Toileting    Toileting assist Assist for toileting: Maximal Assistance - Patient 25 - 49%     Transfers Chair/bed transfer  Transfers assist     Chair/bed transfer assist level: Minimal Assistance - Patient > 75% Chair/bed transfer assistive device: Bedrails, Armrests   Locomotion Ambulation   Ambulation assist  Assist level: 2 helpers Assistive device: Other (comment) (3 Musketeer) Max distance: 43ft   Walk 10 feet activity   Assist     Assist level: 2 helpers Assistive device: Other (comment) (3 Musketeer)   Walk 50 feet activity   Assist Walk 50 feet with 2 turns activity did not occur: Safety/medical concerns  Assist level: 2 helpers Assistive device: Other (comment) (3 Musketeer)    Walk 150 feet activity   Assist Walk 150 feet activity did not occur: Safety/medical concerns         Walk 10 feet on uneven surface   activity   Assist Walk 10 feet on uneven surfaces activity did not occur: Safety/medical concerns         Wheelchair     Assist Will patient use wheelchair at discharge?: No             Wheelchair 50 feet with 2 turns activity    Assist            Wheelchair 150 feet activity     Assist           Medical Problem List and Plan: 1.  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    Continue CIR PT, OT, SLP  11/8 discussed with pt/family re: prognosis and ultimate potential to return to independently living again 2.  Antithrombotics: -DVT/anticoagulation:  Pharmaceutical: Other (comment)--now on Eliquis.              -antiplatelet therapy: N/A 3. Pain Management: Tylenol prn. Well controlled 4. Mood: LCSW to follow for evaluation and support.              -antipsychotic agents: N/A 5. Neuropsych: This patient is capable of making decisions on her own behalf. 6. Skin/Wound Care: Routine pressure relief measures.  7. Fluids/Electrolytes/Nutrition: Monitor I/Os.               I personally reviewed the patient's labs today and WNL 11/8 8. HTN: Monitor BP tid--now on coreg and Entresto. Avoid LBP due to L-PCA stenosis  Orthostatics ordered- dizziness has much improved with therapy             Monitor with increased mobility  11/8 "permissive htn" to prevent hypoperfusion 9. A fib s/p cardioversion: Monitor HR tid--on coreg bid, amiodarone and Xarelto.              Monitor with increased exertion  11/5: Bradycardic: decrease Coreg to 12.5 BID  11/7: Coreg stopped d/t bradycardia   -Hydralazine 10mg  TID was added for her BP  11/8 consider cardiology f/u this week re: rate control, assessment of meds 10. T2DM with  hyperglycemia: Hgb A1c- 7.2. Monitor BS ac/hs. Diabetic education  11/8 reasonable control 11. Breast cancer: Arimidex--diagnosed in August but not had follow up due to multiple admissions secondary to cardiac issues. Family has been requesting referral to Hem/onc/surgeon in town--? Referral sent.  12. ABLA  Hb 10.9 on 11/4---> 11.0 on 11/8  Cont to monitor     LOS: 5 days A FACE TO FACE EVALUATION WAS PERFORMED  Meredith Staggers 04/06/2020, 12:58 PM

## 2020-04-07 ENCOUNTER — Inpatient Hospital Stay (HOSPITAL_COMMUNITY): Payer: No Typology Code available for payment source | Admitting: Occupational Therapy

## 2020-04-07 ENCOUNTER — Inpatient Hospital Stay (HOSPITAL_COMMUNITY): Payer: No Typology Code available for payment source

## 2020-04-07 DIAGNOSIS — I480 Paroxysmal atrial fibrillation: Secondary | ICD-10-CM

## 2020-04-07 LAB — LIPID PANEL
Cholesterol: 142 mg/dL (ref 0–200)
HDL: 48 mg/dL (ref >40)
LDL Cholesterol: 85 mg/dL (ref 0–99)
Total CHOL/HDL Ratio: 3 RATIO
Triglycerides: 47 mg/dL (ref <150)
VLDL: 9 mg/dL (ref 0–40)

## 2020-04-07 LAB — GLUCOSE, CAPILLARY
Glucose-Capillary: 115 mg/dL — ABNORMAL HIGH (ref 70–99)
Glucose-Capillary: 123 mg/dL — ABNORMAL HIGH (ref 70–99)
Glucose-Capillary: 122 mg/dL — ABNORMAL HIGH (ref 70–99)
Glucose-Capillary: 115 mg/dL — ABNORMAL HIGH (ref 70–99)

## 2020-04-07 LAB — TSH: TSH: 2.026 u[IU]/mL (ref 0.350–4.500)

## 2020-04-07 MED ORDER — BLOOD PRESSURE CONTROL BOOK
Freq: Once | Status: AC
  Filled 2020-04-07: qty 1

## 2020-04-07 NOTE — Progress Notes (Signed)
Subjective:  Patient denies any chest pain or shortness of breath.  Heart rate slightly improved after reducing amiodarone  Objective:  Vital Signs in the last 24 hours: Temp:  [98.2 F (36.8 C)-98.8 F (37.1 C)] 98.6 F (37 C) (11/09 1303) Pulse Rate:  [57-58] 58 (11/09 1303) Resp:  [17-18] 17 (11/09 1303) BP: (152-186)/(73-85) 166/85 (11/09 1303) SpO2:  [91 %-100 %] 100 % (11/09 1303) Weight:  [105.6 kg] 105.6 kg (11/09 0524)  Intake/Output from previous day: 11/08 0701 - 11/09 0700 In: 520 [P.O.:520] Out: 600 [Urine:600] Intake/Output from this shift: Total I/O In: 420 [P.O.:420] Out: -   Physical Exam: exam unchanged  Lab Results: Recent Labs    04/06/20 0753  WBC 6.0  HGB 11.0*  PLT 270   Recent Labs    04/06/20 1106  NA 138  K 3.7  CL 103  CO2 26  GLUCOSE 115*  BUN 12  CREATININE 0.84   No results for input(s): TROPONINI in the last 72 hours.  Invalid input(s): CK, MB Hepatic Function Panel No results for input(s): PROT, ALBUMIN, AST, ALT, ALKPHOS, BILITOT, BILIDIR, IBILI in the last 72 hours. Recent Labs    04/07/20 0649  CHOL 142   No results for input(s): PROTIME in the last 72 hours.  Imaging: Imaging results have been reviewed and No results found.  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 Marked sinus bradycardia secondary to medications improved Uncontrolled hypertension. Non-insulin-dependent diabetes mellitus History of paroxysmal A. Fib on chronic anticoagulation  Chadvasc  score of 5 History of cardioversion in the past. Hyperlipidemia. Morbid obesity. Carcinoma of the left breast Plan Continue present management. I will sign off.  Please call if needed. Follow-up with me as outpatient in 2 weeks  LOS: 6 days    Charolette Forward 04/07/2020, 3:54 PM

## 2020-04-07 NOTE — Progress Notes (Signed)
Physical Therapy Session Note  Patient Details  Name: Courtney Coleman MRN: [id number] Date of Birth: [birth year]  Today's Date: 04/07/2020 PT Individual Time: 0800-0900 PT Individual Time Calculation (min): 60 min   Short Term Goals: Week 1:  PT Short Term Goal 1 (Week 1): Pt will complete bed mobility with minA PT Short Term Goal 2 (Week 1): Pt will perform bed<>chair transfers with minA and LRAD PT Short Term Goal 3 (Week 1): Pt will ambulate 53ft with minA and LRAD PT Short Term Goal 4 (Week 1): Pt will negotiate up/down x4 steps with modA and LRAD  Skilled Therapeutic Interventions/Progress Updates:     Pt received sitting EOB with husband assisting with self care/dressing. Pt eager to begin therapies, no c/o pain. Donned socks with totalA for time management. Performed stand<>pivot transfer with minA and RW from EOB to w/c, cues for safety approach and general sequencing. W/c transport for energy conservation to ortho gym. Performed x4 squat<>pivot transfers, requiring CGA towards her stronger L side and minA towards her weaker R side. Performed both seated and supine there-ex for RLE NMR: -1x10 LAQ with 5 second isometric holds in knee extension -1x10 sit<>stand with minA and R knee block for stability -1x15 standing toe taps on 4inch block requiring modA for balance -1x20 RLE standing hamstring curls with minA for balance -1x20 RLE supine heel slides focusing on sagital plane hip flex/ext -1x20 bridges with therapist stabilizing R knee -1x20 RLE hip add/abd in supine hooklying *Sit>supine with minA for RLE management on mat table. Requires minA also for sit>Supine for trunk control and RLE assist.  Stand<>step pivot with min/modA and RW from mat table to Nustep. Completed a total of 8 minutes on Nustep at workload of 5, using BLE's only, focusing on BLE coordination, RLE knee/hip extension. Required frequent TC's for R knee control due to adduction in knee flexion.Pt required x4 seated  rest breaks during Nustep 2/2 fatigue. Stand<>pivot with minA and RW from Nustep back to w/c. After rest break, she ambulated ~36ft with min/modA and RW on level surfaces, cues for widening BOS, increasing R heel strike, RW management, and lateral weight shifts. When cued for increasing step length, would often cause scissoring or excessive narrow BOS. Squat<>pivot with minA from EOB to w/c, towards R side. She remained seated in w/c with chair alarm on, Fiance at bedside, needs in reach.  Therapy Documentation Precautions:  Precautions Precautions: Fall Precaution Comments: R hemiparesis, orthostasis Restrictions Weight Bearing Restrictions: No  Therapy/Group: Individual Therapy  Kaile Bixler P Arali Somera PT 04/07/2020, 8:23 AM

## 2020-04-07 NOTE — Progress Notes (Signed)
Patient ID: Courtney Coleman, female   DOB: February 21, 1966, 54 y.o.   MRN: [id number] Met with the patient to review role of the nurse CM and collaboration with the SW to facilitate preparation for discharge. Reviewed risk factors for secondary stroke including HTN, HLD and DM. Reviewed DASH diet, statin use and management of DM with a CMM diet. Also reviewed HF and zone plan. States an understanding of information reviewed, given handouts to use for review of info covered. No questions or concerns noted at present. Continue to follow along to discharge. Margarito Liner

## 2020-04-07 NOTE — Progress Notes (Signed)
[place] PHYSICAL MEDICINE & REHABILITATION PROGRESS NOTE  Subjective/Complaints: Patient seen working with therapy this morning.  She states she slept well overnight.  Discussed improving function, particularly dorsiflexion with therapies.  Discussed BP/HR issues yesterday with husband and cardiology.  ROS: Denies CP, SOB, N/V/D  Objective: Vital Signs: Blood pressure (!) 152/77, pulse (!) 57, temperature 98.8 F (37.1 C), temperature source Oral, resp. rate 18, height 5\' 6"  (1.676 m), weight 105.6 kg, SpO2 91 %. No results found. Recent Labs    04/06/20 0753  WBC 6.0  HGB 11.0*  HCT 35.6*  PLT 270   Recent Labs    04/06/20 1106  NA 138  K 3.7  CL 103  CO2 26  GLUCOSE 115*  BUN 12  CREATININE 0.84  CALCIUM 9.2    Intake/Output Summary (Last 24 hours) at 04/07/2020 1226 Last data filed at 04/07/2020 0756 Gross per 24 hour  Intake 600 ml  Output 600 ml  Net 0 ml     Physical Exam: BP (!) 152/77   Pulse (!) 57   Temp 98.8 F (37.1 C) (Oral)   Resp 18   Ht 5\' 6"  (1.676 m)   Wt 105.6 kg   SpO2 91%   BMI 37.58 kg/m  Constitutional: No distress . Vital signs reviewed. HENT: Normocephalic.  Atraumatic. Eyes: EOMI. No discharge. Cardiovascular: No JVD.  RRR. Respiratory: Normal effort.  No stridor.  Bilateral clear to auscultation. GI: Non-distended.  BS +. Skin: Warm and dry.  Intact. Psych: Normal mood.  Normal behavior. Musc: No edema in extremities.  No tenderness in extremities. Neuro: Alert Mild right facial weakness Motor: LUE/LLE: 5/5 proximal distal RUE: 4+/5 proximal distal with apraxia  RLE: 4+/5 proximal distal   Assessment/Plan: 1. 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  Physiatrist is providing  close team supervision and 24 hour management of active medical problems listed below.  Physiatrist and rehab team continue to assess barriers to discharge/monitor patient progress toward functional and medical goals   Care Tool:  Bathing    Body parts bathed by patient: Right arm, Left arm, Chest, Abdomen, Front perineal area, Buttocks, Right upper leg, Left upper leg, Face   Body parts bathed by helper: Left lower leg, Right lower leg     Bathing assist Assist Level: Minimal Assistance - Patient > 75%     Upper Body Dressing/Undressing Upper body dressing   What is the patient wearing?: Pull over shirt    Upper body assist Assist Level: Minimal Assistance - Patient > 75%    Lower Body Dressing/Undressing Lower body dressing      What is the patient wearing?: Incontinence brief, Pants     Lower body assist Assist for lower body dressing: Moderate Assistance - Patient 50 - 74%     Toileting Toileting    Toileting assist Assist for toileting: Minimal Assistance - Patient > 75%     Transfers Chair/bed transfer  Transfers assist     Chair/bed transfer assist level: Minimal Assistance - Patient > 75% Chair/bed transfer assistive device: Bedrails, Armrests   Locomotion Ambulation   Ambulation assist      Assist level: 2 helpers Assistive device: Other (comment) (3 Musketeer) Max distance: 14ft   Walk 10 feet activity   Assist  Assist level: 2 helpers Assistive device: Other (comment) (3 Musketeer)   Walk 50 feet activity   Assist Walk 50 feet with 2 turns activity did not occur: Safety/medical concerns  Assist level: 2 helpers Assistive device: Other (comment) (3 Musketeer)    Walk 150 feet activity   Assist Walk 150 feet activity did not occur: Safety/medical concerns         Walk 10 feet on uneven surface  activity   Assist Walk 10 feet on uneven surfaces activity did not occur: Safety/medical concerns          Wheelchair     Assist Will patient use wheelchair at discharge?: No             Wheelchair 50 feet with 2 turns activity    Assist            Wheelchair 150 feet activity     Assist           Medical Problem List and Plan: 1.  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    Continue CIR  Lipid profile within normal limits on 11/9 2.  Antithrombotics: -DVT/anticoagulation:  Pharmaceutical: Other (comment)--now on Eliquis.              -antiplatelet therapy: N/A 3. Pain Management: Tylenol prn.   Controlled on 11/9 4. Mood: LCSW to follow for evaluation and support.              -antipsychotic agents: N/A 5. Neuropsych: This patient is capable of making decisions on her own behalf. 6. Skin/Wound Care: Routine pressure relief measures.  7. Fluids/Electrolytes/Nutrition: Monitor I/Os.   8. HTN: Monitor BP. Avoid LBP due to L-PCA stenosis  Appreciate cardiology recs  Blood pressure elevated, orthostatics negative on 11/9             Monitor with increased mobility 9. A fib s/p cardioversion: Monitor HR.             Monitor with increased exertion  Medication being adjusted by cardiology, appreciate recs 10. T2DM with hyperglycemia: Hgb A1c- 7.2. Monitor BS ac/hs. Diabetic education  Mildly elevated on 11/9  Continue to monitor 11. Breast cancer: Arimidex--diagnosed in August but not had follow up due to multiple admissions secondary to cardiac issues. Family has been requesting referral to Hem/onc/surgeon in town--? Referral sent.  12. ABLA  Hb 11.0 on 11/8  Cont to monitor     LOS: 6 days A FACE TO FACE EVALUATION WAS PERFORMED  Nickholas Goldston Lorie Phenix 04/07/2020, 12:26 PM

## 2020-04-07 NOTE — Progress Notes (Signed)
Occupational Therapy Session Note  Patient Details  Name: Courtney Coleman MRN: [id number] Date of Birth: May 14, 1966  Today's Date: 04/07/2020 OT Individual Time: 1416-1530 OT Individual Time Calculation (min): 74 min    Short Term Goals: Week 1:  OT Short Term Goal 1 (Week 1): Pt will recall functional place to put right UE during grooming tasks with min questioning cues OT Short Term Goal 2 (Week 1): Pt will perform sit to stands with contact guard with recall with min A for feet placement OT Short Term Goal 3 (Week 1): Pt will don shirt with min A with hemi dressing techniques OT Short Term Goal 4 (Week 1): Pt will thread underwear and pants with min A with extra time  Skilled Therapeutic Interventions/Progress Updates:    Pt sitting up in w/c, no c/o pain, requesting to shower.  Pts fiance present for entire session for support and encouragement.  Pt ambulated to bathroom with RW needing min assist due to right knee intermittently buckling.  Pt completed TTB transfer with min assist to slow descent using grab bars as well.  Doffing of UB and LB dressing completed with CGA. Pt bathed UB with min assist for back and LB with CGA using forward lean to wash buttocks.  Stand pivot TTB to w/c with min assist using grab bars.  Pt transported to sink and donned gown per pt request with min assist.  Brief donned with mod assist to thread over feet and tab mgt as well as VCs needed to encourage functional use of right hand to pinch and pull.  Pt brushed teeth using right hand using a gross grasp technique and needing min assist to apply toothpaste and place on sink with one instance of dropping toothbrush.  Pt participated in seated tabletop [facility] activities including large bead threading and large peg grasp, pinch, and place using template design to copy.  Pt required increased time, intermittent TCs /hand over hand to facilitate proprioceptive feedback, and frequent VCs to encourage use of thumb, index, and  long fingers for improved precision and control.  Pt requesting back to bed.  Stand pivot at [facility] with min assist needed to EOB.  Min assist sit to supine for RLE support.  Call bell in reach, bed alarm on.  Therapy Documentation Precautions:  Precautions Precautions: Fall Precaution Comments: R hemiparesis, orthostasis Restrictions Weight Bearing Restrictions: No   Therapy/Group: Individual Therapy  Ezekiel Slocumb 04/07/2020, 1:00 PM

## 2020-04-07 NOTE — Progress Notes (Signed)
Speech Language Pathology Daily Session Note  Patient Details  Name: Courtney Coleman MRN: [id number] Date of Birth: Sep 29, 1965  Today's Date: 04/07/2020 SLP Individual Time: 1103-1202 SLP Individual Time Calculation (min): 59 min  Short Term Goals: Week 1: SLP Short Term Goal 1 (Week 1): Pt will use word finding strategies during structured speech tasks with Supervision A cues SLP Short Term Goal 2 (Week 1): Pt will use word finding strategies in informal/unstructured conversation with mildly complex language with Min A cues SLP Short Term Goal 3 (Week 1): Pt will write at the sentence level with 80% accuracy and Supervision A level cues for correcting spelling errors SLP Short Term Goal 4 (Week 1): Pt will identify verbal and/or written errors with Min A cues  Skilled Therapeutic Interventions:Skilled ST services focused on language skills. Pt's fiance was present and SLP provided education for word finding cuing, he was able to return demonstration throughout the session with supervision cues. SLP facilitated word finding skills in structured tasks naming synonyms with mod A semantic cues and opposites with min A semantic cues. Pt was able to create specific defintions given words with supervision A semantic cues. Pt demonstrated ability to list members of fruits and vegetables in divergent naming tasks with mod A fade to min A verbal cues for use visualization associations and sub-categorizations.  Pt was left in room with fiance call bell within reach and chair alarm set. SLP recommends to continue skilled services.      Pain Pain Assessment Pain Scale: 0-10 Pain Score: 0-No pain  Therapy/Group: Individual Therapy  Ceejay Kegley  [facility] 04/07/2020, 12:11 PM

## 2020-04-08 ENCOUNTER — Inpatient Hospital Stay (HOSPITAL_COMMUNITY): Payer: No Typology Code available for payment source | Admitting: Physical Therapy

## 2020-04-08 ENCOUNTER — Inpatient Hospital Stay (HOSPITAL_COMMUNITY): Payer: No Typology Code available for payment source | Admitting: Occupational Therapy

## 2020-04-08 ENCOUNTER — Inpatient Hospital Stay (HOSPITAL_COMMUNITY): Payer: No Typology Code available for payment source | Admitting: Speech Pathology

## 2020-04-08 ENCOUNTER — Encounter (HOSPITAL_COMMUNITY): Payer: No Typology Code available for payment source | Admitting: Psychology

## 2020-04-08 ENCOUNTER — Inpatient Hospital Stay (HOSPITAL_COMMUNITY): Payer: No Typology Code available for payment source

## 2020-04-08 DIAGNOSIS — K5901 Slow transit constipation: Secondary | ICD-10-CM

## 2020-04-08 LAB — GLUCOSE, CAPILLARY
Glucose-Capillary: 114 mg/dL — ABNORMAL HIGH (ref 70–99)
Glucose-Capillary: 115 mg/dL — ABNORMAL HIGH (ref 70–99)
Glucose-Capillary: 82 mg/dL (ref 70–99)
Glucose-Capillary: 95 mg/dL (ref 70–99)

## 2020-04-08 MED ORDER — POLYETHYLENE GLYCOL 3350 17 G PO PACK
17.0000 g | PACK | Freq: Two times a day (BID) | ORAL | Status: DC
  Administered 2020-04-08 – 2020-04-22 (×19): 17 g via ORAL
  Filled 2020-04-08 (×28): qty 1

## 2020-04-08 NOTE — Progress Notes (Signed)
Occupational Therapy Session Note  Patient Details  Name: Courtney Coleman MRN: [id number] Date of Birth: 1966-01-25  Today's Date: 04/08/2020 OT Individual Time: 1400-1445 OT Individual Time Calculation (min): 45 min    Short Term Goals: Week 1:  OT Short Term Goal 1 (Week 1): Pt will recall functional place to put right UE during grooming tasks with min questioning cues OT Short Term Goal 2 (Week 1): Pt will perform sit to stands with contact guard with recall with min A for feet placement OT Short Term Goal 3 (Week 1): Pt will don shirt with min A with hemi dressing techniques OT Short Term Goal 4 (Week 1): Pt will thread underwear and pants with min A with extra time  Skilled Therapeutic Interventions/Progress Updates:    Pt sitting up in w/c, no c/o pain, agreeable to OT session.  Pt requesting to defer self care and work on right hand control.  Pt transported to dayroom via w/c for time mgt.  Pt participated in tabletop fine and gross motor activity with NMR component using right hand including yellow putty gross grasp, roll and 3 jaw chuck pinch, towel gathers with finger isolations, thumb opposition to Index-small, small item retrieval from putty, in hand manipulation finger<>palm using extra small pegs.  Pt required VCs, visual demo, and intermittent TCs to facilitate functional pinching position, and isolation of each digit to complete tasks.  Pt transported to room and completed stand pivot to recliner with CGA using RW and min assist to slow descent stand to sit.  Call bell in reach, seat belt alarm on.  Therapy Documentation Precautions:  Precautions Precautions: Fall Precaution Comments: R hemiparesis, orthostasis Restrictions Weight Bearing Restrictions: No   Therapy/Group: Individual Therapy  Ezekiel Slocumb 04/08/2020, 3:47 PM

## 2020-04-08 NOTE — Progress Notes (Signed)
Patient ID: Courtney Coleman, female   DOB: [birth year], 55 y.o.   MRN: [id number] Met with pt and fiance to inform team conference goals supervision-CGA level and target discharge date 11/24. Pt was really hoping she could go home 11/14. She realizes she needs the rehab and is in agreement with the date the therapy team gave her. Her fiance was supportive and encouraging. Have changed her Oncology appt for 11/30 @ 1:00 pm. Work on discharge needs. Pt will not be eligible for any home health services due to being uninsured.

## 2020-04-08 NOTE — Progress Notes (Signed)
Speech Language Pathology Weekly Progress and Session Note  Patient Details  Name: Courtney Coleman MRN: [id number] Date of Birth: 1965-11-03  Beginning of progress report period: April 02, 2020 End of progress report period: April 08, 2020  Today's Date: 04/08/2020 SLP Individual Time: 1030-1130 SLP Individual Time Calculation (min): 60 min  Short Term Goals: Week 1: SLP Short Term Goal 1 (Week 1): Pt will use word finding strategies during structured speech tasks with Supervision A cues SLP Short Term Goal 1 - Progress (Week 1): Met SLP Short Term Goal 2 (Week 1): Pt will use word finding strategies in informal/unstructured conversation with mildly complex language with Min A cues SLP Short Term Goal 2 - Progress (Week 1): Partly met SLP Short Term Goal 3 (Week 1): Pt will write at the sentence level with 80% accuracy and Supervision A level cues for correcting spelling errors (Patient struggles with writing with non-dominant hand (left) at word level) SLP Short Term Goal 3 - Progress (Week 1): Discontinued (comment) SLP Short Term Goal 4 (Week 1): Pt will identify verbal and/or written errors with Min A cues SLP Short Term Goal 4 - Progress (Week 1): Partly met    New Short Term Goals: Week 2: SLP Short Term Goal 1 (Week 2): Patient will describe object function at sentence level with 90% accuracy and minA cues for word-finding, fluency. SLP Short Term Goal 2 (Week 2): Patient will identify verbal/written errors of spelling and word use (not grammatical) with 80% accuracy and minA cues. SLP Short Term Goal 3 (Week 2): Patient will make inferences and/or describe meaning of familiar idioms at phrase level with minA for 80% accuracy. SLP Short Term Goal 4 (Week 2): Patient will verbally summarize after she or clinician reads aloud a short paragraph, with 85% accuracy and minA cues.  Weekly Progress Updates:  Patient met 1/3 STG's and was making progress to the two goals she didn't  meet. One goal (sentence level writing) was not met due to patient having difficulty writing with her non-dominant (left) hand. Patient is very motivated and works hard during sessions. She has difficulty speaking in structured and unstructured conversations, describing and summarizing. She is motivated to work but has not shown significant frustrations with her overall performance.   Intensity: Minumum of 1-2 x/day, 30 to 90 minutes Frequency: 3 to 5 out of 7 days Duration/Length of Stay: 2-3 weeks Treatment/Interventions: Cueing hierarchy;Cognitive remediation/compensation;Speech/Language facilitation;Patient/family education   Daily Session  Skilled Therapeutic Interventions:  Patient seen in room with boyfriend present for skilled ST session. Patient participated in sentence correction task and required mod A to identify errors of tense, punctuation and minA for errors of incorrect word. She struggled with attempting to describe some familiar idioms and was more literal in her descriptions. With modA cues from SLP, she was able to improve with this task. Patient was initiating more during unstructured conversation but continues to speak mainly in short phrases. Patient continues to benefit from skilled SLP intervention to maximize language abilities prior to discharge.     General    Pain Pain Assessment Pain Scale: 0-10 Pain Score: 0-No pain  Therapy/Group: Individual Therapy  Sonia Baller, MA, CCC-SLP Speech Therapy

## 2020-04-08 NOTE — Consult Note (Signed)
Neuropsychological Consultation   Patient:   Courtney Coleman   DOB:   November 23, 1965  MR Number:  [id number]  Location:  [facility] [id number] [place] [address] Dept: [phone] Loc: [phone]           Date of Service:   04/08/2020  Start Time:   3 PM End Time:   4 PM  Provider/Observer:  Ilean Skill, Psy.D.       Clinical Neuropsychologist       Billing Code/Service: [phone]  Chief Complaint:    Courtney Coleman is a 54 year old female with history of hypertension, breast cancer (diagnosed 12/2019), A. fib.  Patient admitted on 03/27/2020 with reports of 24-hour history of speech difficulties, mild confusion and right-sided hemiparesis with difficulty walking.  MRI/MRA brain showed ischemia within the dorsomedial left thalamus, left cerebral peduncle, small foci in corpus callosum and left basal ganglia as well as occlusion of left PCA P1-2 junction.  Dr. Erlinda Hong felt stroke was embolic in nature due to A. fib.  The patient has had improving issues with expressive/receptive aphasia and difficulty with complex information processing and following command, right inattention and right-sided weakness.  Reason for Service:  Patient was referred for neuropsychological consultation due to coping following recent CVA with also a recent history of diagnosis of breast cancer.  Below is the HPI for the current admission.  HPI: Courtney Coleman is a 54 year old female with history of HTN, breast cancer (dx 12/2019), Afib s/p cardioversion who was admitted on 03/27/2020 with reports of 24 hour history of speech difficutly, mild confusion, and right sided hemiparesis with difficulty walking.  History taken from chart review, husband, and patient.  UDS negative.  MRI/MRI brain showed ischemia within the dorsomedial left thalamus, left cerebral peduncle, small foci in corpus callosum and left basal ganglia as well as  occlusion of left PCA P1-2 junction.  Echocardiogram with ejection fraction of 60-5%, no wall abnormality, mild to moderate aortic sclerosis and severe left atrial dilatation.  Patient's home Xarelto was changed to Eliquis due to failure of treatment.  Hospital course further complicated by transient worsening of right-sided weakness on 03/28/2020 due to orthostatic hypotension and was treated with IV fluids and bedrest.  Dr. Erlinda Hong felt the stroke was embolic from A. fib and to avoid low blood pressure given left PCA stenosis.  Patient with resultant expressive/receptive aphasia, difficulty with complex command, right inattention, orthostasis with headaches as well as right-sided weakness affecting ADLs and mobility.  CIR was recommended due to functional decline.  Please see preadmission assessment from earlier today as well.  Current Status:  Patient was sitting up in her bed when I entered the room and was alert and oriented but did have some slowed information processing speed observe through behavioral observation.  Receptive speech appeared to be intact and the patient was able to effectively communicate and there appears to be significant improvement in expressive language abilities.  Most of her expressive language deficits noted by the patient appeared to be motor deficits in nature.  The patient acknowledged some stressors around the medical complications she has been facing including her recent diagnosis of breast cancer as well as her even more recent CVA.  The patient denied severe depression but did acknowledge anxiety and coping issues around recent medical complications.  Behavioral Observation: Courtney Coleman  presents as a 54 y.o.-year-old Right African American Female who appeared her stated age. her dress  was Appropriate and she was Well Groomed and her manners were Appropriate to the situation.  her participation was indicative of Appropriate and Redirectable behaviors.  There were any  physical disabilities noted.  she displayed an appropriate level of cooperation and motivation.     Interactions:    Active Appropriate and Redirectable  Attention:   abnormal and attention span appeared shorter than expected for age  Memory:   within normal limits; recent and remote memory intact  Visuo-spatial:  not examined but earlier reports of visual inattention.  Speech (Volume):  low  Speech:   normal; normal with some slowed verbal production and potentially some mild word finding issues but overall adequate expressive language capacity.  Thought Process:  Coherent and Relevant  Though Content:  WNL; not suicidal and not homicidal  Orientation:   person, place, time/date and situation  Judgment:   Fair  Planning:   Fair  Affect:    Anxious  Mood:    Dysphoric  Insight:   Good  Intelligence:   normal  Medical History:   Past Medical History:  Diagnosis Date  . Breast cancer ([place])   . CHF (congestive heart failure) ([place])   . Dysrhythmia   . Hypertension    Psychiatric History:  No prior psychiatric history  Family Med/Psych History:  Family History  Problem Relation Age of Onset  . Heart disease Mother   . Diabetes Father   . Cancer Neg Hx     Impression/DX:  Courtney Coleman is a 54 year old female with history of hypertension, breast cancer (diagnosed 12/2019), A. fib.  Patient admitted on 03/27/2020 with reports of 24-hour history of speech difficulties, mild confusion and right-sided hemiparesis with difficulty walking.  MRI/MRA brain showed ischemia within the dorsomedial left thalamus, left cerebral peduncle, small foci in corpus callosum and left basal ganglia as well as occlusion of left PCA P1-2 junction.  Dr. Erlinda Hong felt stroke was embolic in nature due to A. fib.  The patient has had improving issues with expressive/receptive aphasia and difficulty with complex information processing and following command, right inattention and right-sided  weakness.  Patient was sitting up in her bed when I entered the room and was alert and oriented but did have some slowed information processing speed observe through behavioral observation.  Receptive speech appeared to be intact and the patient was able to effectively communicate and there appears to be significant improvement in expressive language abilities.  Most of her expressive language deficits noted by the patient appeared to be motor deficits in nature.  The patient acknowledged some stressors around the medical complications she has been facing including her recent diagnosis of breast cancer as well as her even more recent CVA.  The patient denied severe depression but did acknowledge anxiety and coping issues around recent medical complications.  Disposition/Plan:  Worked on coping and adjustment issues.  I do think the patient would benefit from a seeing her again first of next week.  Anxiety and coping issues are still challenging the patient going forward and she continues to struggle with dealing with the significant recent medical diagnoses and conditions she is facing.  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

## 2020-04-08 NOTE — Patient Care Conference (Signed)
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 Delice Lesch Care Coodinator Present: Dorien Chihuahua, RN, BSN, CRRN;Becky Dupree, LCSW Nurse Present: Pamella Pert) Glandorf, LPN PT Present: Ginnie Smart, PT OT Present: Leretha Pol, OT SLP Present: Jettie Booze, CF-SLP PPS Coordinator present : Gunnar Fusi, Novella Olive, PT     Current Status/Progress Goal Weekly Team Focus  Bowel/Bladder   incontinent of bowel and bladder. LBM 11/8  timed toileting while awake  assess toileting needs qshift and PRN   Swallow/Nutrition/ Hydration             ADL's   supervision/minA UB self care, min/modA LB self care, minA functional transfers  supervision  functional transfers, self care, NMR RUE, compensatory techniques   Mobility   minA bed mobility, minA stand<>pivot with RW, minA squat pivot. Gait ~173ft with minA and RW  supervision/CGA  Functional transfers, Gait progressions, RLE NMR   Communication   Supervision A simple conversation, Mod-min A strcutred mildly complex tasks  Mod I word finding. Supervision A spelling  word finding strategies and spelling   Safety/Cognition/ Behavioral Observations  Supervision A right side and error awareness  Mod I  emergent awareness verbal and functional   Pain   no c/o pain  remain free of  pain  assess pain qshift and PRN   Skin   No skin impairments  remian free of skin impsirments  assess skin qshift and PRN     Discharge Planning:  HOme with niece and fiance who is here daily and has seen her in therapies. He is able to provide assist at discharge. Appointments set up for Oncology and PCP   Team Discussion: Doing well overall post thalamic stroke. MD adjusted medications for blood pressure and heart-rate.Patient is motivated to return home. CBGs stable. Dorsiflexion improved on affected limb and able to ambulate 150' min-mod assist with a rolling walker.  Patient on target to meet rehab goals: yes  *See Care Plan and progress notes for long and short-term goals.   Revisions to Treatment Plan:  Review layout of the home Compensatory strategies reviewed  Teaching Needs: Transfers, toileting, medications, etc.   Current Barriers to Discharge: Decreased caregiver support, Home enviroment access/layout and lack of insurance for coverage for [facility] services  Possible Resolutions to Barriers: Family education Home exercise program     Medical Summary Current Status: Expressive/receptive aphasia, difficulty with complex command, right inattention, right hemiparesis affecting ADLs and mobility secondary to dorsomedial left thalamus, left cerebral peduncle, small foci in corpus callosum and left basal ganglia as well as occlusion of left PCA P1-2 junction.  Barriers to Discharge: Medical stability;Weight   Possible Resolutions to [employer] Focus: Therapies, follow labs -Hb, optimize DM/HTN/HR meds, appreciate Cards recs   Continued Need for Acute Rehabilitation Level of Care: The patient requires  daily medical management by a physician with specialized training in physical medicine and rehabilitation for the following reasons: Direction of a multidisciplinary physical rehabilitation program to maximize functional independence : Yes Medical management of patient  stability for increased activity during participation in an intensive rehabilitation regime.: Yes Analysis of laboratory values and/or radiology reports with any subsequent need for medication adjustment and/or medical intervention. : Yes   I attest that I was present, lead the team conference, and concur with the assessment and plan of the team.   Dorien Chihuahua B 04/08/2020, 3:38 PM

## 2020-04-08 NOTE — Progress Notes (Signed)
Physical Therapy Session Note  Patient Details  Name: Courtney Coleman MRN: [id number] Date of Birth: [birth year]  Today's Date: 04/08/2020 PT Individual Time: 0900-1000 PT Individual Time Calculation (min): 60 min   Short Term Goals: Week 1:  PT Short Term Goal 1 (Week 1): Pt will complete bed mobility with minA PT Short Term Goal 2 (Week 1): Pt will perform bed<>chair transfers with minA and LRAD PT Short Term Goal 3 (Week 1): Pt will ambulate 72ft with minA and LRAD PT Short Term Goal 4 (Week 1): Pt will negotiate up/down x4 steps with modA and LRAD  Skilled Therapeutic Interventions/Progress Updates:    Pt received sitting in w/c, agreeable to PT session without c/o pain. W/c transport for time management from her room to main therapy gym. Performed squat<>pivot with minA from w/c to mat table, towards stronger L side. Performed RLE NMR seated at mat table, including: -1x20 ankle DF with AAROM to end range (*improved DF 2/5(!)) -1x15 LAQ with 5 second isometric knee extensions -1x15 isometric hamstring curls -1x15 sit<>stands with minA and mild R knee block.  She then ambulated 2x168ft + 273ft with minA and RW, cues throughout for R knee stability, RW management, increasing R heel strike. She demonstrates improved R foot clearance but continues to struggle with R knee stability with mostly R knee hyperextension in terminal stance. She also has difficulty preventing RW from veering L during gait. Performed Nustep at workload 5 for x8 minutes, focusing on R knee stability, R hip flex and R knee ext. Stand<>pivot transfer with minA from Nustep to w/c and returned to her room where she remained seated in w/c at end of session, needs in reach, safety belt alarm on.  Therapy Documentation Precautions:  Precautions Precautions: Fall Precaution Comments: R hemiparesis, orthostasis Restrictions Weight Bearing Restrictions: No  Therapy/Group: Individual Therapy  Abbi Mancini P Lexii Walsh  PT 04/08/2020, 7:40 AM

## 2020-04-08 NOTE — Progress Notes (Signed)
Physical Therapy Session Note  Patient Details  Name: Courtney Coleman MRN: [id number] Date of Birth: August 22, 1965  Today's Date: 04/08/2020 PT Individual Time: 9295-7473 PT Individual Time Calculation (min): 30 min   Short Term Goals: Week 1:  PT Short Term Goal 1 (Week 1): Pt will complete bed mobility with minA PT Short Term Goal 2 (Week 1): Pt will perform bed<>chair transfers with minA and LRAD PT Short Term Goal 3 (Week 1): Pt will ambulate 77f with minA and LRAD PT Short Term Goal 4 (Week 1): Pt will negotiate up/down x4 steps with modA and LRAD  Skilled Therapeutic Interventions/Progress Updates:   Pt received sitting in WC and agreeable to PT. [facility]   [facility] session x8.  pregait stepping on level surface forward 2 x 3 BLE, over 1 in bar on floor 2 x 3. To tap on 4 inch step 2 x 3.  Manual facilitation to improve hip extension and glute med activation in stance on the R as well as prevent compensations in trunk with hip/extension and knee flexion to return to starting position.   Gait training with RW 2 x 255fwith min assist and min-mod cues for posture, improved stance time on the R with improved glute med activaiton, and to prevent GR in stance on the R. Noted improvement with increased distance each bout.   [facility] returned to [facility] and [facility] and all needs met.          Therapy Documentation Precautions:  Precautions Precautions: Fall Precaution Comments: R hemiparesis, orthostasis Restrictions Weight Bearing Restrictions: No Pain: Pain Assessment Pain Scale: 0-10 Pain Score: 0-No pain    Therapy/Group: Individual Therapy  AuLorie Phenix1/02/2020, 9:32 AM

## 2020-04-08 NOTE — Progress Notes (Addendum)
[place] PHYSICAL MEDICINE & REHABILITATION PROGRESS NOTE  Subjective/Complaints: Patient seen working with therapy this morning.  She states she slept well overnight.  Discussed improving function, particularly dorsiflexion with therapies.  Discussed BP/HR issues yesterday with husband and cardiology.  ROS: Denies CP, SOB, N/V/D  Objective: Vital Signs: Blood pressure (!) 152/73, pulse (!) 53, temperature 98.2 F (36.8 C), temperature source Oral, resp. rate 20, height 5\' 6"  (1.676 m), weight 104.7 kg, SpO2 98 %. No results found. Recent Labs    04/06/20 0753  WBC 6.0  HGB 11.0*  HCT 35.6*  PLT 270   Recent Labs    04/06/20 1106  NA 138  K 3.7  CL 103  CO2 26  GLUCOSE 115*  BUN 12  CREATININE 0.84  CALCIUM 9.2    Intake/Output Summary (Last 24 hours) at 04/08/2020 1119 Last data filed at 04/08/2020 0754 Gross per 24 hour  Intake 642 ml  Output 400 ml  Net 242 ml     Physical Exam: BP (!) 152/73 (BP Location: Right Arm)   Pulse (!) 53   Temp 98.2 F (36.8 C) (Oral)   Resp 20   Ht 5\' 6"  (1.676 m)   Wt 104.7 kg   SpO2 98%   BMI 37.26 kg/m  Constitutional: No distress . Vital signs reviewed. HENT: Normocephalic.  Atraumatic. Eyes: EOMI. No discharge. Cardiovascular: No JVD.  RRR. Respiratory: Normal effort.  No stridor.  Bilateral clear to auscultation. GI: Non-distended.  BS +. Skin: Warm and dry.  Intact. Psych: Normal mood.  Normal behavior. Musc: No edema in extremities.  No tenderness in extremities. Neuro: Alert Mild right facial weakness Motor: LUE/LLE: 5/5 proximal distal RUE: 4+/5 proximal distal with apraxia  RLE: 4+/5 proximal distal   Assessment/Plan: 1. 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  Physiatrist is providing close team supervision and 24 hour management of active medical problems listed below.  Physiatrist and rehab team continue to assess barriers to discharge/monitor patient progress toward functional and medical goals   Care Tool:  Bathing    Body parts bathed by patient: Right arm, Left arm, Chest, Abdomen, Front perineal area, Buttocks, Right upper leg, Left upper leg, Face   Body parts bathed by helper: Left lower leg, Right lower leg     Bathing assist Assist Level: Minimal Assistance - Patient > 75%     Upper Body Dressing/Undressing Upper body dressing   What is the patient wearing?: Pull over shirt    Upper body assist Assist Level: Minimal Assistance - Patient > 75%    Lower Body Dressing/Undressing Lower body dressing      What is the patient wearing?: Incontinence brief, Pants     Lower body assist Assist for lower body dressing: Moderate Assistance - Patient 50 - 74%     Toileting Toileting    Toileting assist Assist for toileting: Minimal Assistance - Patient > 75%     Transfers Chair/bed transfer  Transfers assist     Chair/bed transfer assist level: Minimal Assistance - Patient > 75% Chair/bed transfer assistive device: Bedrails, Armrests   Locomotion Ambulation   Ambulation assist      Assist level: 2 helpers Assistive device: Other (comment) (3 Musketeer) Max distance: 9ft   Walk 10 feet activity  Assist     Assist level: 2 helpers Assistive device: Other (comment) (3 Musketeer)   Walk 50 feet activity   Assist Walk 50 feet with 2 turns activity did not occur: Safety/medical concerns  Assist level: 2 helpers Assistive device: Other (comment) (3 Musketeer)    Walk 150 feet activity   Assist Walk 150 feet activity did not occur: Safety/medical concerns         Walk 10 feet on uneven surface  activity   Assist Walk 10 feet on uneven surfaces activity did not occur:  Safety/medical concerns         Wheelchair     Assist Will patient use wheelchair at discharge?: No             Wheelchair 50 feet with 2 turns activity    Assist            Wheelchair 150 feet activity     Assist           Medical Problem List and Plan: 1.  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    Continue CIR  Lipid profile within normal limits on 11/9  Team conference today to discuss current and goals and coordination of care, home and environmental barriers, and discharge planning with nursing, case manager, and therapies. Please see conference note from today as well.  2.  Antithrombotics: -DVT/anticoagulation:  Pharmaceutical: Other (comment)--now on Eliquis.              -antiplatelet therapy: N/A 3. Pain Management: Tylenol prn.   Controlled on 11/10 4. Mood: LCSW to follow for evaluation and support.              -antipsychotic agents: N/A 5. Neuropsych: This patient is capable of making decisions on her own behalf. 6. Skin/Wound Care: Routine pressure relief measures.  7. Fluids/Electrolytes/Nutrition: Monitor I/Os.   8. HTN: Monitor BP. Avoid LBP due to L-PCA stenosis  Appreciate cardiology recs  Elevated, but improving, within reasonable range on 11/10             Monitor with increased mobility 9. A fib s/p cardioversion: Monitor HR.             Monitor with increased exertion  Heart rate borderline low on 11/10  Appreciate cardiology recs 10. T2DM with hyperglycemia: Hgb A1c- 7.2. Monitor BS ac/hs. Diabetic education  Mildly elevated on 11/10  Continue to monitor 11. Breast cancer: Arimidex--diagnosed in August but not had follow up due to multiple admissions secondary to cardiac  issues. Family has been requesting referral to Hem/onc/surgeon in town--? Referral sent.  12. ABLA  Hb 11.0 on 11/8  Cont to monitor 13. Slow transit constipation  Bowel meds increased on 11/10  LOS: 7 days A FACE TO FACE EVALUATION WAS PERFORMED  Hermelinda Diegel Lorie Phenix 04/08/2020, 11:19 AM

## 2020-04-09 ENCOUNTER — Ambulatory Visit: Payer: No Typology Code available for payment source | Admitting: Hematology and Oncology

## 2020-04-09 ENCOUNTER — Inpatient Hospital Stay (HOSPITAL_COMMUNITY): Payer: No Typology Code available for payment source | Admitting: Occupational Therapy

## 2020-04-09 ENCOUNTER — Inpatient Hospital Stay (HOSPITAL_COMMUNITY): Payer: No Typology Code available for payment source

## 2020-04-09 DIAGNOSIS — R001 Bradycardia, unspecified: Secondary | ICD-10-CM

## 2020-04-09 LAB — GLUCOSE, CAPILLARY
Glucose-Capillary: 115 mg/dL — ABNORMAL HIGH (ref 70–99)
Glucose-Capillary: 116 mg/dL — ABNORMAL HIGH (ref 70–99)
Glucose-Capillary: 93 mg/dL (ref 70–99)
Glucose-Capillary: 123 mg/dL — ABNORMAL HIGH (ref 70–99)

## 2020-04-09 MED ORDER — AMLODIPINE BESYLATE 2.5 MG PO TABS
2.5000 mg | ORAL_TABLET | Freq: Every day | ORAL | Status: DC
  Administered 2020-04-09 – 2020-04-12 (×4): 2.5 mg via ORAL
  Filled 2020-04-09 (×4): qty 1

## 2020-04-09 NOTE — Progress Notes (Signed)
Speech Language Pathology Daily Session Note  Patient Details  Name: Courtney Coleman MRN: [id number] Date of Birth: 09-Oct-1965  Today's Date: 04/09/2020 SLP Individual Time: 1300-1400 SLP Individual Time Calculation (min): 60 min  Short Term Goals: Week 2: SLP Short Term Goal 1 (Week 2): Patient will describe object function at sentence level with 90% accuracy and minA cues for word-finding, fluency. SLP Short Term Goal 2 (Week 2): Patient will identify verbal/written errors of spelling and word use (not grammatical) with 80% accuracy and minA cues. SLP Short Term Goal 3 (Week 2): Patient will make inferences and/or describe meaning of familiar idioms at phrase level with minA for 80% accuracy. SLP Short Term Goal 4 (Week 2): Patient will verbally summarize after she or clinician reads aloud a short paragraph, with 85% accuracy and minA cues.  Skilled Therapeutic Interventions: Skilled SLP intervention focused on verbal and written expression. Pt able to write 2-4 word sentences containing common words and locations independently. Min A needed x2 during structured description task. Pt summarized short paragraphs with no word finding difficulty noted this session. Pts spouse reports significant improvement in the last week with verbal expression. Cont with therapy per plan of care.      Pain Pain Assessment Pain Scale: Faces Faces Pain Scale: No hurt  Therapy/Group: Individual Therapy  Darrol Poke Trisha Ken 04/09/2020, 2:04 PM

## 2020-04-09 NOTE — Progress Notes (Signed)
Physical Therapy Session Note  Patient Details  Name: Courtney Coleman MRN: [id number] Date of Birth: July 19, 1965  Today's Date: 04/09/2020 PT Individual Time: 0800-0915 PT Individual Time Calculation (min): 75 min   Short Term Goals: Week 1:  PT Short Term Goal 1 (Week 1): Pt will complete bed mobility with minA PT Short Term Goal 2 (Week 1): Pt will perform bed<>chair transfers with minA and LRAD PT Short Term Goal 3 (Week 1): Pt will ambulate 77ft with minA and LRAD PT Short Term Goal 4 (Week 1): Pt will negotiate up/down x4 steps with modA and LRAD  Skilled Therapeutic Interventions/Progress Updates:    Pt received sitting in w/c, fiance at bedside assisting with morning preperations. Pt agreeable to PT session without c/o pain. W/c transport for time management to main therapy gym. Pt reports she was able to grip a brush with RUE, reports excitement with progress. Squat<>pivot transfer with CGA from w/c to mat table. Performed RLE NMR while seated EOM: -1x10 ankle DF/PF (AAROM for end range) -1x15 LAQ with 3# ankle weight -1x15 knee isometric extensions with 3# ankle weights, 5 second holds -1x15 isometric reversal with hamstring curls -1x10 sit<>stands with CGA/minA and mild R knee block -1x15 sit<>stands with minA with RLE bias towards strengthening and mild R knee block  Performed standing overhead reaching with minA guard while removing horseshoes from top of mirror (~33ft tall mirror), instructed to use RUE only. No LOB or knee buckling noted but steadiness present. Repeated this activity while placing horseshoes back on mirror with RUE, emphasizing lateral weight shifts and thoracic extension with overhead reaching. She then performed gait training, ambulating 162ft + 156ft (seated rest) with minA and RW. Gait deficits include occasional R knee hyperextension in stance, intermittent R toe drag with decreased R foot clearance, and difficulty managing RW in straight path. Cues throughout  for normalizing gait pattern and focusing on R knee stability. Ambulated to ADL apartment room and performed stand>sit on regular bed with RW and minA for controlled lowering. Sit>Supine with minA for RLE management and trunk control. Also requires minA for supine<>sit on flat bed without bed features for RLE/trunk management. Able to sit EOB with supervision. Performed pre-gait training to emphasize R foot clearance and hamstring facilitation; with CGA and RW support, performed stepping over hockey stick repetitively. Squat<>pivot to w/c with CGA. Wheeled to stairs where pt navigated up/down x4 steps with min/modA and B HR support, frequent cues and reminders needed for proper sequencing (ascend with L foot, descend with R foot, with step-to pattern). Returned to her room in w/c where she remained seated in chair with safety belt alarm on, needs in reach, fiance at bedside.  Therapy Documentation Precautions:  Precautions Precautions: Fall Precaution Comments: R hemiparesis, orthostasis Restrictions Weight Bearing Restrictions: No  Therapy/Group: Individual Therapy  Adriell Polansky P Darcel Zick PT 04/09/2020, 8:20 AM

## 2020-04-09 NOTE — Progress Notes (Signed)
[place] PHYSICAL MEDICINE & REHABILITATION PROGRESS NOTE  Subjective/Complaints: Patient seen sitting up, working with therapies.  She states she slept well overnight.  She is aware of her discharge date, but hopeful for sooner discharge. She states she feels much better after BM yesterday.   ROS: Denies CP, SOB, N/V/D  Objective: Vital Signs: Blood pressure (!) 173/69, pulse (!) 59, temperature 99.5 F (37.5 C), temperature source Oral, resp. rate 18, height 5\' 6"  (1.676 m), weight 104.7 kg, SpO2 97 %. No results found. No results for input(s): WBC, HGB, HCT, PLT in the last 72 hours. No results for input(s): NA, K, CL, CO2, GLUCOSE, BUN, CREATININE, CALCIUM in the last 72 hours.  Intake/Output Summary (Last 24 hours) at 04/09/2020 1114 Last data filed at 04/09/2020 0750 Gross per 24 hour  Intake 540 ml  Output 103 ml  Net 437 ml     Physical Exam: BP (!) 173/69 (BP Location: Left Arm)   Pulse (!) 59   Temp 99.5 F (37.5 C) (Oral)   Resp 18   Ht 5\' 6"  (1.676 m)   Wt 104.7 kg   SpO2 97%   BMI 37.26 kg/m  Constitutional: No distress . Vital signs reviewed. HENT: Normocephalic.  Atraumatic. Eyes: EOMI. No discharge. Cardiovascular: No JVD.  RRR. Respiratory: Normal effort.  No stridor.  Bilateral clear to auscultation. GI: Non-distended.  BS +. Skin: Warm and dry.  Intact. Psych: Normal mood.  Normal behavior. Musc: No edema in extremities.  No tenderness in extremities. Neuro: Alert Mild right facial weakness Motor: LUE/LLE: 5/5 proximal distal RUE: 4+/5 proximal distal with apraxia, stable RLE: 4+/5 proximal distal, stable  Assessment/Plan: 1. 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  Physiatrist is providing close  team supervision and 24 hour management of active medical problems listed below.  Physiatrist and rehab team continue to assess barriers to discharge/monitor patient progress toward functional and medical goals   Care Tool:  Bathing    Body parts bathed by patient: Right arm, Left arm, Chest, Abdomen, Front perineal area, Buttocks, Right upper leg, Left upper leg, Face   Body parts bathed by helper: Left lower leg, Right lower leg     Bathing assist Assist Level: Minimal Assistance - Patient > 75%     Upper Body Dressing/Undressing Upper body dressing   What is the patient wearing?: Pull over shirt    Upper body assist Assist Level: Minimal Assistance - Patient > 75%    Lower Body Dressing/Undressing Lower body dressing      What is the patient wearing?: Incontinence brief, Pants     Lower body assist Assist for lower body dressing: Moderate Assistance - Patient 50 - 74%     Toileting Toileting    Toileting assist Assist for toileting: Minimal Assistance - Patient > 75%     Transfers Chair/bed transfer  Transfers assist     Chair/bed transfer assist level: Contact Guard/Touching assist Chair/bed transfer assistive device: Bedrails, Armrests   Locomotion Ambulation   Ambulation assist      Assist level: Minimal Assistance - Patient > 75% Assistive device: Walker-rolling Max distance: 179ft   Walk 10 feet activity   Assist     Assist level: Minimal Assistance - Patient > 75% Assistive device: Walker-rolling   Walk 50  feet activity   Assist Walk 50 feet with 2 turns activity did not occur: Safety/medical concerns  Assist level: Minimal Assistance - Patient > 75% Assistive device: Walker-rolling    Walk 150 feet activity   Assist Walk 150 feet activity did not occur: Safety/medical concerns  Assist level: Minimal Assistance - Patient > 75% Assistive device: Walker-rolling    Walk 10 feet on uneven surface  activity   Assist Walk  10 feet on uneven surfaces activity did not occur: Safety/medical concerns         Wheelchair     Assist Will patient use wheelchair at discharge?: No             Wheelchair 50 feet with 2 turns activity    Assist            Wheelchair 150 feet activity     Assist           Medical Problem List and Plan: 1.  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    Continue CIR  Lipid profile within normal limits on 11/9 2.  Antithrombotics: -DVT/anticoagulation:  Pharmaceutical: Other (comment)--now on Eliquis.              -antiplatelet therapy: N/A 3. Pain Management: Tylenol prn.   Controlled on 11/11 4. Mood: LCSW to follow for evaluation and support.              -antipsychotic agents: N/A 5. Neuropsych: This patient is capable of making decisions on her own behalf. 6. Skin/Wound Care: Routine pressure relief measures.  7. Fluids/Electrolytes/Nutrition: Monitor I/Os.   8. HTN: Monitor BP. Avoid LBP due to L-PCA stenosis  Appreciate cardiology recs  Norvasc 2.5 started on 11/11  Elevated on 11/11             Monitor with increased mobility 9. A fib s/p cardioversion: Monitor HR.             Monitor with increased exertion  Heart rate bradycardic on 11/11  Appreciate cardiology recs 10. T2DM with hyperglycemia: Hgb A1c- 7.2. Monitor BS ac/hs. Diabetic education  Mildly elevated on 11/11  Continue to monitor 11. Breast cancer: Arimidex--diagnosed in August but not had follow up due to multiple admissions secondary to cardiac issues. Family has been requesting referral to Hem/onc/surgeon in town--? Referral sent.  12. ABLA  Hb 11.0 on 11/8  Cont to monitor 13. Slow transit constipation  Bowel meds increased  on 11/10  Improving  LOS: 8 days A FACE TO FACE EVALUATION WAS PERFORMED  Mckinlee Dunk Lorie Phenix 04/09/2020, 11:14 AM

## 2020-04-09 NOTE — Progress Notes (Signed)
Occupational Therapy Session Note  Patient Details  Name: Courtney Coleman MRN: [id number] Date of Birth: 1965/09/16  Today's Date: 04/09/2020 OT Individual Time: 1000-1045 OT Individual Time Calculation (min): 45 min    Short Term Goals: Week 1:  OT Short Term Goal 1 (Week 1): Pt will recall functional place to put right UE during grooming tasks with min questioning cues OT Short Term Goal 2 (Week 1): Pt will perform sit to stands with contact guard with recall with min A for feet placement OT Short Term Goal 3 (Week 1): Pt will don shirt with min A with hemi dressing techniques OT Short Term Goal 4 (Week 1): Pt will thread underwear and pants with min A with extra time  Skilled Therapeutic Interventions/Progress Updates:    Pt sitting up in w/c,  Wanting to defer self care, reports her husband helped "wash down" this morning, and requesting to work on right hand dexterity.  Pt transported to day room and participated in table top [facility] activities with NMR component right hand. Activities included retrieving extra small pegs from putty, rolling/gripping/pinching putty, buttoning/unbuttoning 6 large buttons on [facility] box, and finger <>palm in hand manipulation using various sized and shaped small items.  Pt required intermittent VCs, TCs, and visual demo to facilitate increased fine motor precision and control.  Pt transported back to room via w/c, call bell in reach, seat belt alarm on.  Pt exhibited increased finger isolation especially in the thumb today.  Still requires extended time to complete simple fine motor task with right hand due to impairments in motor planning.    Therapy Documentation Precautions:  Precautions Precautions: Fall Precaution Comments: R hemiparesis, orthostasis Restrictions Weight Bearing Restrictions: No   Therapy/Group: Individual Therapy  Ezekiel Slocumb 04/09/2020, 12:43 PM

## 2020-04-10 ENCOUNTER — Inpatient Hospital Stay (HOSPITAL_COMMUNITY): Payer: No Typology Code available for payment source | Admitting: Occupational Therapy

## 2020-04-10 ENCOUNTER — Inpatient Hospital Stay (HOSPITAL_COMMUNITY): Payer: No Typology Code available for payment source

## 2020-04-10 LAB — GLUCOSE, CAPILLARY
Glucose-Capillary: 113 mg/dL — ABNORMAL HIGH (ref 70–99)
Glucose-Capillary: 121 mg/dL — ABNORMAL HIGH (ref 70–99)
Glucose-Capillary: 148 mg/dL — ABNORMAL HIGH (ref 70–99)
Glucose-Capillary: 134 mg/dL — ABNORMAL HIGH (ref 70–99)

## 2020-04-10 NOTE — Progress Notes (Signed)
Physical Therapy Weekly Progress Note  Patient Details  Name: Courtney Coleman MRN: [id number] Date of Birth: 1965-09-19  Beginning of progress report period: April 02, 2020 End of progress report period: April 10, 2020  Today's Date: 04/10/2020 PT Individual Time: 5638-9373 + 1300-1415 PT Individual Time Calculation (min): 60 min + 75 min   Patient has met 4 of 4 short term goals. Pt is making appropriate progress towards her goals. She continues to show strong motivation to participate in therapies and is determined to return as much function in her R hemibody as much as possible. She requires minA for bed mobility, minA for squat<>pivot and stand<>pivot transfers, can ambulate ~52fwith minA and RW (with RW gutter splint) and 1549fwith modA and RW (with RW gutter splint). She is able to negotiate up/down x4 steps with modA and 2 hand rails.   Patient continues to demonstrate the following deficits muscle weakness and impaired timing and sequencing, unbalanced muscle activation, motor apraxia, decreased coordination and decreased motor planning and therefore will continue to benefit from skilled PT intervention to increase functional independence with mobility.  Patient progressing toward long term goals.  Continue plan of care.  PT Short Term Goals Week 2:  PT Short Term Goal 1 (Week 2): Pt will completed bed mobility with supervision PT Short Term Goal 2 (Week 2): Pt will complete bed<>chair transfers with CGA and LRAD PT Short Term Goal 3 (Week 2): Pt will ambulate 15021fith minA and LRAD PT Short Term Goal 4 (Week 2): Pt will negotiate up/down x4 steps with minA and LRAD  Skilled Therapeutic Interventions/Progress Updates:      1st session: Pt received sitting in w/c, agreeable to PT session without c/o pain, fiance at bedside. W/c transport for time management from her room to main therapy gym. Squat<>pivot with CGA/minA from w/c to mat table, towards stronger L side. Performed  RLE NMR at edge of mat including: -1x10 RLE LAQ with 4# ankle weight -1x10 RLE knee isometric with 5 second holds with 4# ankle wieght -1x10 RLE isometric hamstring reversals -1x10 standing RLE hamstring curls -1x10 standing RLE isometric hamstring curls with 5 second holds -1x10 sit<>stands with 4inch block under LLE -1x10 sit<>stands with 4inch block under RLE *Seated rest breaks provided b/w sets for recovery, therapist providing cues for correct sequencing and muscle activation throughout  She then performed sit<>stands with minA and task overlay for placing clothes pin onto basketball net that was placed on her R side, promoting R weight shift and reaching outside cone of stability. She required minA for balance and was instructed to use RUE only for placing clothespins. She had great difficulty with fine motor control of her R digits for grasping clothes pin but she was able to complete with extra time/effort. Focused remained of session on gait training. Squat<>pivot with minA from mat table to w/c. She ambulated 2x150f27fth minA (regressing to light modA after ~75ft29fd RW. Gait deficits include intermittent R toe drag, narrow BOS, R knee hyperextension, and veering L with the RW.  She also performed side stepping ~30ft 18fe holding on to the rail, note occasional scissoring and difficulty maintaining R hip extension/abduction combination while side stepping R. Returned to her room where she remained seated in w/c with needs in reach, chair alarm on, fiance at bedside.  2nd session Pt received sitting in w/c, agreeable to PT session without c/o pain. W/c transport for time management to dayroom gym. Performed squat<>pivot transfer with minA from w/c  to mat table. Performed repeated sit<>stands with mild R knee block focusing on R weight shift and erect posture. Also performed ball toss while seated at mat table focusing on core control, RUE coordination, and sitting balance. Attempted to  perform eccentric step downs on 2inch platform however pt unable to prevent knee buckling without mod/maxA while trying to step L foot back up to platform. Therefore, deferred further attempts for safety concerns and would benefit from having // bars for this activity. Attached LIteGait harness while she stood with minA, required min/modA for stepping on to treadmill where she ambulating with partial weight bearing support on LiteGait at 0.66mh. Total time intervals as follows: -632m [id number] *64fed rest breaks required with lap times listed above  Gait deficits include premature stepping of LLE resulting in decreased RLE weightbearing and stance time. Also intermittent R toe drag with decreased R foot clearance. Placed obstacles on treadmill for her to avoid to facilitate increased R foot clearance and this appeared to help with good carryover during session. A few instances of R knee hyperextension but not as much as prior sessions. LiteGait harness removed in similar fashion as described above. Performed stand<>pivot transfer with minA from w/c to Nustep. She completed x8 min at workload of 4, focusing on both cardiovascular endurance as well as R knee stability and hip extension. Required minA for stand<>pivot back to w/c and returned to her room where she remained seated in w/c with chair alarm on, needs in reach.  Therapy Documentation Precautions:  Precautions Precautions: Fall Precaution Comments: R hemiparesis, orthostasis Restrictions Weight Bearing Restrictions: No  Therapy/Group: Individual Therapy  Xaine Sansom P Mariana Wiederholt PT 04/10/2020, 12:21 PM

## 2020-04-10 NOTE — Progress Notes (Signed)
Occupational Therapy Weekly Progress Note  Patient Details  Name: Courtney Coleman MRN: [id number] Date of Birth: May 02, 1966  Beginning of progress report period: April 03, 2020 End of progress report period: April 10, 2020  Today's Date: 04/10/2020 OT Individual Time: 1530-1630 OT Individual Time Calculation (min): 60 min    Patient has met 2 of 4 short term goals.  Pt is making steady gains with skilled OT and is very motivated to progress and participate.  Pt has increased functional use of right hand and is now using to brush teeth and self feed, however still needing increased time and min assist and still needs VCs at times to incorporate right hand.  Pt has also increased independence during UB dressing from mod assist to setup and bathing tasks with min assist only.  Pts safety awareness is improving as well as her functional transfers more consistently CGA with increased controlled descent.   Patient continues to demonstrate the following deficits: muscle weakness, motor apraxia, decreased coordination and decreased motor planning, decreased problem solving, decreased safety awareness, decreased memory and delayed processing and decreased sitting balance, decreased standing balance and decreased balance strategies and therefore will continue to benefit from skilled OT intervention to enhance overall performance with BADL.  Patient progressing toward long term goals..  Continue plan of care.  OT Short Term Goals Week 1:  OT Short Term Goal 1 (Week 1): Pt will recall functional place to put right UE during grooming tasks with min questioning cues OT Short Term Goal 1 - Progress (Week 1): Met OT Short Term Goal 2 (Week 1): Pt will perform sit to stands with contact guard with recall with min A for feet placement OT Short Term Goal 2 - Progress (Week 1): Progressing toward goal OT Short Term Goal 3 (Week 1): Pt will don shirt with min A with hemi dressing techniques OT Short Term Goal  3 - Progress (Week 1): Met OT Short Term Goal 4 (Week 1): Pt will thread underwear and pants with min A with extra time OT Short Term Goal 4 - Progress (Week 1): Progressing toward goal Week 2:  OT Short Term Goal 1 (Week 2): Pt will complete LB dressing with min assist. OT Short Term Goal 2 (Week 2): Pt will complete sit<>stand with CGA and safe descent in prep for LB self care. OT Short Term Goal 3 (Week 2): Pt will brush teeth using right dominant hand with supervision.  Skilled Therapeutic Interventions/Progress Updates:    Pt sitting up in w/c, no c/o pain, agreeable to OT session.  Pt completed stand pivot w/c<>TTB with CGA using grab bar.  Pt doffed shirt with mod I and pants and brief with CGA.  Pt bathed UB and LB with min assist for back and right foot.  Pt may benefit from training in use of long handled sponge in future session to increase independence level.  Pt donned gown seated with setup.  Pt donned brief with threading of BLE with min assist to pull over hips in standing at RW and min VCs to increase functional use of right hand.  Pt participated in [facility] seated tabletop activities including 3 jaw chuck pinching to retrieve small items from putty as well as grip/roll/pinch, grasping utensil and bringing to mouth, and handwriting task with builtup pen using coban.  Pt instructed to utilize right hand at each meal using utensils, and handwriting task a few times per day.  Call bell in reach, seat belt alarm on.  Therapy  Documentation Precautions:  Precautions Precautions: Fall Precaution Comments: R hemiparesis, orthostasis Restrictions Weight Bearing Restrictions: No   Therapy/Group: Individual Therapy  Ezekiel Slocumb 04/10/2020, 12:47 PM

## 2020-04-10 NOTE — Progress Notes (Signed)
[place] PHYSICAL MEDICINE & REHABILITATION PROGRESS NOTE  Subjective/Complaints: Patient seen sitting up in his chair this AM.  She states she selpt well overnight.  She states she has a question, but cannot recall. Family at bedside.  She denies complaints. Discussed blood pressure with patient. Patient states she has not had a BM today.   ROS: Denies CP, SOB, N/V/D  Objective: Vital Signs: Blood pressure (!) 162/68, pulse (!) 57, temperature 98.2 F (36.8 C), resp. rate 18, height 5\' 6"  (1.676 m), weight 103.7 kg, SpO2 98 %. No results found. No results for input(s): WBC, HGB, HCT, PLT in the last 72 hours. No results for input(s): NA, K, CL, CO2, GLUCOSE, BUN, CREATININE, CALCIUM in the last 72 hours.  Intake/Output Summary (Last 24 hours) at 04/10/2020 1137 Last data filed at 04/10/2020 0750 Gross per 24 hour  Intake 700 ml  Output 125 ml  Net 575 ml     Physical Exam: BP (!) 162/68 (BP Location: Left Arm)   Pulse (!) 57   Temp 98.2 F (36.8 C)   Resp 18   Ht 5\' 6"  (1.676 m)   Wt 103.7 kg   SpO2 98%   BMI 36.90 kg/m  Constitutional: No distress . Vital signs reviewed. HENT: Normocephalic.  Atraumatic. Eyes: EOMI. No discharge. Cardiovascular: No JVD.  RRR. Respiratory: Normal effort.  No stridor.  Bilateral clear to auscultation. GI: Non-distended.  BS +. Skin: Warm and dry.  Intact. Psych: Normal mood.  Normal behavior. Musc: No edema in extremities.  No tenderness in extremities. Neuro: Alert Mild right facial weakness, stable Motor: LUE/LLE: 5/5 proximal distal RUE: 4+/5 proximal distal with apraxia, unchanged RLE: 4+/5 proximal distal, unchanged  Assessment/Plan: 1. 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  Physiatrist is providing close team supervision and 24 hour management of active medical problems listed below.  Physiatrist and rehab team continue to assess barriers to discharge/monitor patient progress toward functional and medical goals   Care Tool:  Bathing    Body parts bathed by patient: Right arm, Left arm, Chest, Abdomen, Front perineal area, Buttocks, Right upper leg, Left upper leg, Face   Body parts bathed by helper: Left lower leg, Right lower leg     Bathing assist Assist Level: Minimal Assistance - Patient > 75%     Upper Body Dressing/Undressing Upper body dressing   What is the patient wearing?: Pull over shirt    Upper body assist Assist Level: Minimal Assistance - Patient > 75%    Lower Body Dressing/Undressing Lower body dressing      What is the patient wearing?: Incontinence brief, Pants     Lower body assist Assist for lower body dressing: Moderate Assistance - Patient 50 - 74%     Toileting Toileting    Toileting assist Assist for toileting: Minimal Assistance - Patient > 75%     Transfers Chair/bed transfer  Transfers assist     Chair/bed transfer assist level: Contact Guard/Touching assist Chair/bed transfer assistive device: Bedrails, Armrests   Locomotion Ambulation   Ambulation assist      Assist level: Minimal Assistance - Patient > 75% Assistive device: Walker-rolling Max distance: 191ft   Walk 10 feet activity   Assist     Assist level: Minimal Assistance - Patient >  75% Assistive device: Walker-rolling   Walk 50 feet activity   Assist Walk 50 feet with 2 turns activity did not occur: Safety/medical concerns  Assist level: Minimal Assistance - Patient > 75% Assistive device: Walker-rolling    Walk 150 feet activity   Assist Walk 150 feet activity did not occur: Safety/medical concerns  Assist level: Minimal Assistance - Patient > 75% Assistive device: Walker-rolling    Walk 10 feet on  uneven surface  activity   Assist Walk 10 feet on uneven surfaces activity did not occur: Safety/medical concerns         Wheelchair     Assist Will patient use wheelchair at discharge?: No             Wheelchair 50 feet with 2 turns activity    Assist            Wheelchair 150 feet activity     Assist           Medical Problem List and Plan: 1.  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    Continue CIR  Lipid profile within normal limits on 11/9 2.  Antithrombotics: -DVT/anticoagulation:  Pharmaceutical: Other (comment)--now on Eliquis.              -antiplatelet therapy: N/A 3. Pain Management: Tylenol prn.   Controlled on 11/12 4. Mood: LCSW to follow for evaluation and support.              -antipsychotic agents: N/A 5. Neuropsych: This patient is capable of making decisions on her own behalf. 6. Skin/Wound Care: Routine pressure relief measures.  7. Fluids/Electrolytes/Nutrition: Monitor I/Os.   8. HTN: Monitor BP. Avoid LBP due to L-PCA stenosis  Appreciate cardiology recs  Norvasc 2.5 started on 11/11  Remains mildly elevated on 11/12, will not make changes today given recent changes             Monitor with increased mobility 9. A fib s/p cardioversion: Monitor HR.             Monitor with increased exertion  Heart rate borderline bradycardic on 11/12  Appreciate cardiology recs 10. T2DM with hyperglycemia: Hgb A1c- 7.2. Monitor BS ac/hs. Diabetic education  Mildly elevated on 11/12  Continue to monitor 11. Breast cancer: Arimidex--diagnosed in August but not had follow up due to multiple admissions secondary to cardiac issues. Family has been requesting referral to Hem/onc/surgeon in  town--? Referral sent.  12. ABLA  Hb 11.0 on 11/8  Cont to monitor 13. Slow transit constipation  Bowel meds increased on 11/10  Improving overall, does not wish to make further changes today.  LOS: 9 days A FACE TO FACE EVALUATION WAS PERFORMED  Adrian Dinovo Lorie Phenix 04/10/2020, 11:37 AM

## 2020-04-11 LAB — GLUCOSE, CAPILLARY
Glucose-Capillary: 136 mg/dL — ABNORMAL HIGH (ref 70–99)
Glucose-Capillary: 110 mg/dL — ABNORMAL HIGH (ref 70–99)
Glucose-Capillary: 106 mg/dL — ABNORMAL HIGH (ref 70–99)
Glucose-Capillary: 114 mg/dL — ABNORMAL HIGH (ref 70–99)

## 2020-04-11 NOTE — Progress Notes (Signed)
[place] PHYSICAL MEDICINE & REHABILITATION PROGRESS NOTE  Subjective/Complaints:  Pt reports she's doing "great'- denies pain- LBM this AM-    ROS:  Pt denies SOB, abd pain, CP, N/V/C/D, and vision changes   Objective: Vital Signs: Blood pressure (!) 157/65, pulse 64, temperature 98.3 F (36.8 C), temperature source Oral, resp. rate 18, height 5\' 6"  (1.676 m), weight 104.2 kg, SpO2 100 %. No results found. No results for input(s): WBC, HGB, HCT, PLT in the last 72 hours. No results for input(s): NA, K, CL, CO2, GLUCOSE, BUN, CREATININE, CALCIUM in the last 72 hours.  Intake/Output Summary (Last 24 hours) at 04/11/2020 1451 Last data filed at 04/11/2020 0725 Gross per 24 hour  Intake 222 ml  Output 325 ml  Net -103 ml     Physical Exam: BP (!) 157/65 (BP Location: Left Arm)   Pulse 64   Temp 98.3 F (36.8 C) (Oral)   Resp 18   Ht 5\' 6"  (1.676 m)   Wt 104.2 kg   SpO2 100%   BMI 37.08 kg/m  Constitutional: No distress . Vital signs reviewed.sitting up in PJs in bedside chair, watching TV, NAD HENT: Normocephalic.  Atraumatic. Eyes: EOMI. No discharge. Cardiovascular: RRR. Respiratory: CTA B/L- no W/R/R- good air movement GI: Soft, NT, ND, (+)BS  Skin: Warm and dry.  Intact. Psych: bright affect. Musc: No edema in extremities.  No tenderness in extremities. Neuro: Alert Mild right facial weakness, stable Motor: LUE/LLE: 5/5 proximal distal RUE: 4+/5 proximal distal with apraxia, unchanged RLE: 4+/5 proximal distal, unchanged  Assessment/Plan: 1. 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  Physiatrist is providing close team supervision and 24 hour management of active medical problems listed below.  Physiatrist and  rehab team continue to assess barriers to discharge/monitor patient progress toward functional and medical goals   Care Tool:  Bathing    Body parts bathed by patient: Right arm, Left arm, Chest, Abdomen, Front perineal area, Buttocks, Right upper leg, Left upper leg, Face, Left lower leg   Body parts bathed by helper: Right lower leg     Bathing assist Assist Level: Minimal Assistance - Patient > 75%     Upper Body Dressing/Undressing Upper body dressing   What is the patient wearing?: Pull over shirt, Hospital gown only    Upper body assist Assist Level: Set up assist    Lower Body Dressing/Undressing Lower body dressing      What is the patient wearing?: Pants, Incontinence brief     Lower body assist Assist for lower body dressing: Contact Guard/Touching assist     Toileting Toileting    Toileting assist Assist for toileting: Minimal Assistance - Patient > 75%     Transfers Chair/bed transfer  Transfers assist     Chair/bed transfer assist level: Contact Guard/Touching assist Chair/bed transfer assistive device: Bedrails, Armrests   Locomotion Ambulation   Ambulation assist      Assist level: Minimal Assistance - Patient > 75% Assistive device: Walker-rolling Max distance: 14ft   Walk 10 feet activity   Assist     Assist level: Minimal Assistance - Patient > 75% Assistive device: Walker-rolling   Walk 50 feet activity   Assist Walk 50 feet with 2 turns activity did not occur: Safety/medical concerns  Assist level:  Minimal Assistance - Patient > 75% Assistive device: Walker-rolling    Walk 150 feet activity   Assist Walk 150 feet activity did not occur: Safety/medical concerns  Assist level: Minimal Assistance - Patient > 75% Assistive device: Walker-rolling    Walk 10 feet on uneven surface  activity   Assist Walk 10 feet on uneven surfaces activity did not occur: Safety/medical concerns          Wheelchair     Assist Will patient use wheelchair at discharge?: No             Wheelchair 50 feet with 2 turns activity    Assist            Wheelchair 150 feet activity     Assist           Medical Problem List and Plan: 1.  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    Continue CIR  Lipid profile within normal limits on 11/9 2.  Antithrombotics: -DVT/anticoagulation:  Pharmaceutical: Other (comment)--now on Eliquis.              -antiplatelet therapy: N/A 3. Pain Management: Tylenol prn.   11/13- controlled- con't regimen 4. Mood: LCSW to follow for evaluation and support.              -antipsychotic agents: N/A 5. Neuropsych: This patient is capable of making decisions on her own behalf. 6. Skin/Wound Care: Routine pressure relief measures.  7. Fluids/Electrolytes/Nutrition: Monitor I/Os.   8. HTN: Monitor BP. Avoid LBP due to L-PCA stenosis  Appreciate cardiology recs  Norvasc 2.5 started on 11/11  Remains mildly elevated on 11/12, will not make changes today given recent changes  11/13- wait to make changes- slightly elevated             Monitor with increased mobility 9. A fib s/p cardioversion: Monitor HR.             Monitor with increased exertion  Heart rate borderline bradycardic on 11/12  11/13- same today- won't change meds yet- Pulse 64  Appreciate cardiology recs 10. T2DM with hyperglycemia: Hgb A1c- 7.2. Monitor BS ac/hs. Diabetic education  Mildly elevated on 11/12  Continue to monitor 11. Breast cancer: Arimidex--diagnosed in August but not had follow up due to multiple admissions secondary to cardiac issues. Family has been requesting referral to Hem/onc/surgeon in town--? Referral  sent.  12. ABLA  Hb 11.0 on 11/8  Cont to monitor 13. Slow transit constipation  Bowel meds increased on 11/10  Improving overall, does not wish to make further changes today.  11/13- BM this AM- con't regimen    LOS: 10 days A FACE TO FACE EVALUATION WAS PERFORMED  Shirlena Brinegar 04/11/2020, 2:51 PM

## 2020-04-11 NOTE — Plan of Care (Signed)
°  Problem: Consults Goal: RH BRAIN INJURY PATIENT EDUCATION Description: Description: See Patient Education module for eduction specifics Outcome: Progressing Goal: Skin Care Protocol Initiated - if Braden Score 18 or less Description: If consults are not indicated, leave blank or document N/A Outcome: Progressing Goal: Nutrition Consult-if indicated Outcome: Progressing Goal: Diabetes Guidelines if Diabetic/Glucose > 140 Description: If diabetic or lab glucose is > 140 mg/dl - Initiate Diabetes/Hyperglycemia Guidelines & Document Interventions  Outcome: Progressing   Problem: RH BOWEL ELIMINATION Goal: RH STG MANAGE BOWEL WITH ASSISTANCE Description: STG Manage Bowel with min Assistance. Outcome: Progressing   Problem: RH BLADDER ELIMINATION Goal: RH STG MANAGE BLADDER WITH ASSISTANCE Description: STG Manage Bladder With modi Assistance Outcome: Progressing   Problem: RH SKIN INTEGRITY Goal: RH STG SKIN FREE OF INFECTION/BREAKDOWN Description: With min assist Outcome: Progressing Goal: RH STG MAINTAIN SKIN INTEGRITY WITH ASSISTANCE Description: STG Maintain Skin Integrity With min Assistance. Outcome: Progressing   Problem: RH SAFETY Goal: RH STG ADHERE TO SAFETY PRECAUTIONS W/ASSISTANCE/DEVICE Description: STG Adhere to Safety Precautions With modi Assistance/Device. Outcome: Progressing Goal: RH STG DECREASED RISK OF FALL WITH ASSISTANCE Description: STG Decreased Risk of Fall With modi Assistance. Outcome: Progressing   Problem: RH COGNITION-NURSING Goal: RH STG USES MEMORY AIDS/STRATEGIES W/ASSIST TO PROBLEM SOLVE Description: STG Uses Memory Aids/Strategies With modi Assistance to Problem Solve. Outcome: Progressing Goal: RH STG ANTICIPATES NEEDS/CALLS FOR ASSIST W/ASSIST/CUES Description: STG Anticipates Needs/Calls for  Assist With modi Assistance/Cues. Outcome: Progressing   Problem: Consults Goal: RH STROKE PATIENT EDUCATION Description: See Patient  Education module for education specifics  Outcome: Progressing   Problem: Consults Goal: RH STROKE PATIENT EDUCATION Description: See Patient Education module for education specifics  Outcome: Progressing   Problem: RH KNOWLEDGE DEFICIT Goal: RH STG INCREASE KNOWLEDGE OF HYPERTENSION Description: Patient will be able to manage HTN with medications and diet modifications using handouts and educational handouts with cues/reminders Outcome: Progressing Goal: RH STG INCREASE KNOWLEGDE OF HYPERLIPIDEMIA Description: Patient will be able to manage HLD with medications and diet modifications using handouts and educational handouts with cues/reminders Outcome: Progressing Goal: RH STG INCREASE KNOWLEDGE OF STROKE PROPHYLAXIS Description: Patient will be able to manage secondary stroke risks and medications with cues/reminders and using handouts and educational information Outcome: Progressing

## 2020-04-12 ENCOUNTER — Inpatient Hospital Stay (HOSPITAL_COMMUNITY): Payer: No Typology Code available for payment source | Admitting: Speech Pathology

## 2020-04-12 ENCOUNTER — Inpatient Hospital Stay (HOSPITAL_COMMUNITY): Payer: No Typology Code available for payment source | Admitting: Physical Therapy

## 2020-04-12 LAB — GLUCOSE, CAPILLARY
Glucose-Capillary: 111 mg/dL — ABNORMAL HIGH (ref 70–99)
Glucose-Capillary: 121 mg/dL — ABNORMAL HIGH (ref 70–99)
Glucose-Capillary: 98 mg/dL (ref 70–99)
Glucose-Capillary: 146 mg/dL — ABNORMAL HIGH (ref 70–99)

## 2020-04-12 MED ORDER — AMLODIPINE BESYLATE 5 MG PO TABS
5.0000 mg | ORAL_TABLET | Freq: Every day | ORAL | Status: DC
  Administered 2020-04-13 – 2020-04-16 (×4): 5 mg via ORAL
  Filled 2020-04-12 (×4): qty 1

## 2020-04-12 NOTE — Progress Notes (Signed)
Speech Language Pathology Daily Session Note  Patient Details  Name: Courtney Coleman MRN: [id number] Date of Birth: [birth year]  Today's Date: 04/12/2020 SLP Individual Time: 1035-1100 SLP Individual Time Calculation (min): 25 min  Short Term Goals: Week 2: SLP Short Term Goal 1 (Week 2): Patient will describe object function at sentence level with 90% accuracy and minA cues for word-finding, fluency. SLP Short Term Goal 2 (Week 2): Patient will identify verbal/written errors of spelling and word use (not grammatical) with 80% accuracy and minA cues. SLP Short Term Goal 3 (Week 2): Patient will make inferences and/or describe meaning of familiar idioms at phrase level with minA for 80% accuracy. SLP Short Term Goal 4 (Week 2): Patient will verbally summarize after she or clinician reads aloud a short paragraph, with 85% accuracy and minA cues.  Skilled Therapeutic Interventions:   Pt was seen for skilled ST targeting communication goals.  Pt was sitting in wheelchair with fiancee present at bedside, awake, alert, and agreeable to participating in [place].  Both pt and her fiancee report that pt is nearly "100%" back to baseline for communication.  They both endorse fluent expression when communicating with friends and family members but that pt periodically struggles to convey novel, complex information (specifically related to her medical care) with unfamiliar listeners, which is to be expected.  Pt reports that she is "satisfied" with her functional communication at this time.  Pt may be approaching readiness for discharge from SLP services but I will defer ultimate decisions regarding pt's plan of care to her primary SLP at this time.  Pt was left in wheelchair with fiancee at bedside.  Continue per current plan of care.    Pain Pain Assessment Pain Scale: 0-10 Pain Score: 0-No pain  Therapy/Group: Individual Therapy  Corynne Scibilia, Selinda Orion 04/12/2020, 11:11 AM

## 2020-04-12 NOTE — Plan of Care (Signed)
  Problem: Consults Goal: RH BRAIN INJURY PATIENT EDUCATION Description: Description: See Patient Education module for eduction specifics Outcome: Progressing Goal: Skin Care Protocol Initiated - if Braden Score 18 or less Description: If consults are not indicated, leave blank or document N/A Outcome: Progressing Goal: Nutrition Consult-if indicated Outcome: Progressing Goal: Diabetes Guidelines if Diabetic/Glucose > 140 Description: If diabetic or lab glucose is > 140 mg/dl - Initiate Diabetes/Hyperglycemia Guidelines & Document Interventions  Outcome: Progressing   Problem: RH BOWEL ELIMINATION Goal: RH STG MANAGE BOWEL WITH ASSISTANCE Description: STG Manage Bowel with min Assistance. Outcome: Progressing   Problem: RH BLADDER ELIMINATION Goal: RH STG MANAGE BLADDER WITH ASSISTANCE Description: STG Manage Bladder With modi Assistance Outcome: Progressing   Problem: RH SKIN INTEGRITY Goal: RH STG SKIN FREE OF INFECTION/BREAKDOWN Description: With min assist Outcome: Progressing Goal: RH STG MAINTAIN SKIN INTEGRITY WITH ASSISTANCE Description: STG Maintain Skin Integrity With min Assistance. Outcome: Progressing   Problem: RH SAFETY Goal: RH STG ADHERE TO SAFETY PRECAUTIONS W/ASSISTANCE/DEVICE Description: STG Adhere to Safety Precautions With modi Assistance/Device. Outcome: Progressing Goal: RH STG DECREASED RISK OF FALL WITH ASSISTANCE Description: STG Decreased Risk of Fall With modi Assistance. Outcome: Progressing   Problem: RH COGNITION-NURSING Goal: RH STG USES MEMORY AIDS/STRATEGIES W/ASSIST TO PROBLEM SOLVE Description: STG Uses Memory Aids/Strategies With modi Assistance to Problem Solve. Outcome: Progressing Goal: RH STG ANTICIPATES NEEDS/CALLS FOR ASSIST W/ASSIST/CUES Description: STG Anticipates Needs/Calls for  Assist With modi Assistance/Cues. Outcome: Progressing   Problem: Consults Goal: RH STROKE PATIENT EDUCATION Description: See Patient  Education module for education specifics  Outcome: Progressing   Problem: Consults Goal: RH STROKE PATIENT EDUCATION Description: See Patient Education module for education specifics  Outcome: Progressing   Problem: RH KNOWLEDGE DEFICIT Goal: RH STG INCREASE KNOWLEDGE OF HYPERTENSION Description: Patient will be able to manage HTN with medications and diet modifications using handouts and educational handouts with cues/reminders Outcome: Progressing Goal: RH STG INCREASE KNOWLEGDE OF HYPERLIPIDEMIA Description: Patient will be able to manage HLD with medications and diet modifications using handouts and educational handouts with cues/reminders Outcome: Progressing Goal: RH STG INCREASE KNOWLEDGE OF STROKE PROPHYLAXIS Description: Patient will be able to manage secondary stroke risks and medications with cues/reminders and using handouts and educational information Outcome: Progressing

## 2020-04-12 NOTE — Progress Notes (Signed)
Physical Therapy Session Note  Patient Details  Name: Courtney Coleman MRN: [id number] Date of Birth: 18-Oct-1965  Today's Date: 04/12/2020 PT Individual Time: 0910-1005 PT Individual Time Calculation (min): 55 min   Short Term Goals: Week 2:  PT Short Term Goal 1 (Week 2): Pt will completed bed mobility with supervision PT Short Term Goal 2 (Week 2): Pt will complete bed<>chair transfers with CGA and LRAD PT Short Term Goal 3 (Week 2): Pt will ambulate 175ft with minA and LRAD PT Short Term Goal 4 (Week 2): Pt will negotiate up/down x4 steps with minA and LRAD  Skilled Therapeutic Interventions/Progress Updates:    Pt received sitting in w/c and agreeable to therapy session. Donned tennis shoes max assist for time management.  Transported to/from gym in w/c for time management and energy conservation. Gait training ~38ft using RW with min/light mod assist for balance - demos intermittent R knee hyperextension during stance, lack of sufficient R hip/knee flexion during swing causing poor foot clearance, and most importantly increased trunk flexion with forward lean onto RW. Performed R LE NMR targeting stance phase control via L foot taps on/off 4" step without UE support - heavy min assist for balance - mirror feedback and cuing for increased R hip extensor and abductor activation, only 2x knee hyperextension but with cuing able to correct and avoid. Gait training ~4ftx ~132ft, ~64ft with +2 assist for L HHA and R UE around therapist's shoulders with +2 min assist for balance - pt demos improved upright trunk posture with improved pelvic movement and weight shifting - therapist facilitating R hip abductor activation during stance with significant improvement in stance control - cuing for increased R stance time and longer L step length - continues to demo delayed knee flexion during swing causing poor R foot clearance. R LE NMR at stairs using B UE support on HRs via R foot taps to/from external  targets on/off 1st 6" height step while having to lift R foot over a cone to force earlier activation of hamstrings for knee flexion when initiating brining foot forward 2 sets to fatigue working on increased speed of movement - pt demos excellent activation of those movements but has difficulty translating them into ambulation with impaired timing. Gait training ~14ft using L HHA and R UE around therapist's shoulders with heavier +2 min assist due to increasing fatigue and continued cuing/facilitation for above gait impairments. Transported back to room and left seated in w/c with needs in reach, chair alarm on, and her fiance present.  Therapy Documentation Precautions:  Precautions Precautions: Fall Precaution Comments: R hemiparesis, orthostasis Restrictions Weight Bearing Restrictions: No  Pain: Denies pain during session.   Therapy/Group: Individual Therapy  Tawana Scale , PT, DPT, CSRS  04/12/2020, 7:51 AM

## 2020-04-12 NOTE — Progress Notes (Signed)
[place] PHYSICAL MEDICINE & REHABILITATION PROGRESS NOTE  Subjective/Complaints:  Pt reports bowels OK- LBM yesterday- denies pain- sitting up in w/c watching TV.  ROS:   Pt denies SOB, abd pain, CP, N/V/C/D, and vision changes   Objective: Vital Signs: Blood pressure (!) 159/75, pulse (!) 57, temperature 97.6 F (36.4 C), temperature source Oral, resp. rate 18, height 5\' 6"  (1.676 m), weight 108 kg, SpO2 97 %. No results found. No results for input(s): WBC, HGB, HCT, PLT in the last 72 hours. No results for input(s): NA, K, CL, CO2, GLUCOSE, BUN, CREATININE, CALCIUM in the last 72 hours.  Intake/Output Summary (Last 24 hours) at 04/12/2020 1056 Last data filed at 04/12/2020 0805 Gross per 24 hour  Intake 860 ml  Output 300 ml  Net 560 ml     Physical Exam: BP (!) 159/75 (BP Location: Left Arm)   Pulse (!) 57   Temp 97.6 F (36.4 C) (Oral)   Resp 18   Ht 5\' 6"  (1.676 m)   Wt 108 kg   SpO2 97%   BMI 38.43 kg/m  Constitutional: No distress . Vital signs reviewed- sitting up in w/c watching TV, family in room, NAD HENT: Normocephalic.  Atraumatic. Eyes: EOMI. No discharge. Cardiovascular: mildly bradycardic- regular rhythm. Respiratory: CTA B/L- no W/R/R- good air movement GI: Soft, NT, ND, (+)BS  Skin: Warm and dry.  Intact. Psych: bright affect.smiling Musc: No edema in extremities.  No tenderness in extremities. Neuro: Alert Mild right facial weakness, stable Motor: LUE/LLE: 5/5 proximal distal RUE: 4+/5 proximal distal with apraxia, unchanged RLE: 4+/5 proximal distal, unchanged  Assessment/Plan: 1. 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  Physiatrist is providing close team supervision and 24 hour management of  active medical problems listed below.  Physiatrist and rehab team continue to assess barriers to discharge/monitor patient progress toward functional and medical goals   Care Tool:  Bathing    Body parts bathed by patient: Right arm, Left arm, Chest, Abdomen, Front perineal area, Buttocks, Right upper leg, Left upper leg, Face, Left lower leg   Body parts bathed by helper: Right lower leg     Bathing assist Assist Level: Minimal Assistance - Patient > 75%     Upper Body Dressing/Undressing Upper body dressing   What is the patient wearing?: Pull over shirt, Hospital gown only    Upper body assist Assist Level: Set up assist    Lower Body Dressing/Undressing Lower body dressing      What is the patient wearing?: Pants, Incontinence brief     Lower body assist Assist for lower body dressing: Contact Guard/Touching assist     Toileting Toileting    Toileting assist Assist for toileting: Minimal Assistance - Patient > 75%     Transfers Chair/bed transfer  Transfers assist     Chair/bed transfer assist level: Contact Guard/Touching assist Chair/bed transfer assistive device: Bedrails, Armrests   Locomotion Ambulation   Ambulation assist      Assist level: Minimal Assistance - Patient > 75% Assistive device: Walker-rolling Max distance: 137ft   Walk 10 feet activity   Assist     Assist level: Minimal Assistance - Patient > 75% Assistive device: Walker-rolling   Walk 50 feet activity   Assist Walk 50 feet with 2 turns  activity did not occur: Safety/medical concerns  Assist level: Minimal Assistance - Patient > 75% Assistive device: Walker-rolling    Walk 150 feet activity   Assist Walk 150 feet activity did not occur: Safety/medical concerns  Assist level: Minimal Assistance - Patient > 75% Assistive device: Walker-rolling    Walk 10 feet on uneven surface  activity   Assist Walk 10 feet on uneven surfaces activity did not occur:  Safety/medical concerns         Wheelchair     Assist Will patient use wheelchair at discharge?: No             Wheelchair 50 feet with 2 turns activity    Assist            Wheelchair 150 feet activity     Assist           Medical Problem List and Plan: 1.  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    Continue CIR  Lipid profile within normal limits on 11/9 2.  Antithrombotics: -DVT/anticoagulation:  Pharmaceutical: Other (comment)--now on Eliquis.              -antiplatelet therapy: N/A 3. Pain Management: Tylenol prn.   11/14-= no pain- con't tylenol prn 4. Mood: LCSW to follow for evaluation and support.              -antipsychotic agents: N/A 5. Neuropsych: This patient is capable of making decisions on her own behalf. 6. Skin/Wound Care: Routine pressure relief measures.  7. Fluids/Electrolytes/Nutrition: Monitor I/Os.   8. HTN: Monitor BP. Avoid LBP due to L-PCA stenosis  Appreciate cardiology recs  Norvasc 2.5 started on 11/11  Remains mildly elevated on 11/12, will not make changes today given recent changes  11/13- wait to make changes- slightly elevated  11/14- increase norvasc to 5mg  daily             Monitor with increased mobility 9. A fib s/p cardioversion: Monitor HR.             Monitor with increased exertion  Heart rate borderline bradycardic on 11/12  11/13- same today- won't change meds yet- Pulse 64  11/14- Mildly bradycardic- rate 57- con't Norvasc.   Appreciate cardiology recs 10. T2DM with hyperglycemia: Hgb A1c- 7.2. Monitor BS ac/hs. Diabetic education  Mildly elevated on 11/12  Continue to monitor 11. Breast cancer: Arimidex--diagnosed in August but not had follow up due  to multiple admissions secondary to cardiac issues. Family has been requesting referral to Hem/onc/surgeon in town--? Referral sent.  12. ABLA  Hb 11.0 on 11/8  Cont to monitor 13. Slow transit constipation  Bowel meds increased on 11/10  Improving overall, does not wish to make further changes today.  11/13- BM this AM- con't regimen  11/14- LBM yesterday AM    LOS: 11 days A FACE TO FACE EVALUATION WAS PERFORMED  Courtney Coleman 04/12/2020, 10:56 AM

## 2020-04-13 ENCOUNTER — Inpatient Hospital Stay (HOSPITAL_COMMUNITY): Payer: No Typology Code available for payment source | Admitting: Occupational Therapy

## 2020-04-13 ENCOUNTER — Inpatient Hospital Stay (HOSPITAL_COMMUNITY): Payer: No Typology Code available for payment source

## 2020-04-13 DIAGNOSIS — C50112 Malignant neoplasm of central portion of left female breast: Secondary | ICD-10-CM

## 2020-04-13 DIAGNOSIS — C50912 Malignant neoplasm of unspecified site of left female breast: Secondary | ICD-10-CM

## 2020-04-13 DIAGNOSIS — Z170 Estrogen receptor positive status [ER+]: Secondary | ICD-10-CM

## 2020-04-13 LAB — CBC
HCT: 35.2 % — ABNORMAL LOW (ref 36.0–46.0)
Hemoglobin: 10.9 g/dL — ABNORMAL LOW (ref 12.0–15.0)
MCH: 26.3 pg (ref 26.0–34.0)
MCHC: 31 g/dL (ref 30.0–36.0)
MCV: 85 fL (ref 80.0–100.0)
Platelets: 258 10*3/uL (ref 150–400)
RBC: 4.14 MIL/uL (ref 3.87–5.11)
RDW: 19.9 % — ABNORMAL HIGH (ref 11.5–15.5)
WBC: 5.8 10*3/uL (ref 4.0–10.5)
nRBC: 0 % (ref 0.0–0.2)

## 2020-04-13 LAB — BASIC METABOLIC PANEL
Anion gap: 8 (ref 5–15)
BUN: 12 mg/dL (ref 6–20)
CO2: 24 mmol/L (ref 22–32)
Calcium: 9 mg/dL (ref 8.9–10.3)
Chloride: 106 mmol/L (ref 98–111)
Creatinine, Ser: 0.73 mg/dL (ref 0.44–1.00)
GFR, Estimated: >60 mL/min (ref >60)
Glucose, Bld: 101 mg/dL — ABNORMAL HIGH (ref 70–99)
Potassium: 3.6 mmol/L (ref 3.5–5.1)
Sodium: 138 mmol/L (ref 135–145)

## 2020-04-13 LAB — GLUCOSE, CAPILLARY
Glucose-Capillary: 101 mg/dL — ABNORMAL HIGH (ref 70–99)
Glucose-Capillary: 112 mg/dL — ABNORMAL HIGH (ref 70–99)
Glucose-Capillary: 135 mg/dL — ABNORMAL HIGH (ref 70–99)
Glucose-Capillary: 115 mg/dL — ABNORMAL HIGH (ref 70–99)

## 2020-04-13 NOTE — Progress Notes (Signed)
Occupational Therapy Session Note  Patient Details  Name: Courtney Coleman MRN: [id number] Date of Birth: 20-May-1966  Today's Date: 04/13/2020 OT Individual Time: 1000-1058 OT Individual Time Calculation (min): 58 min   Skilled Therapeutic Interventions/Progress Updates:    Pt greeted in the w/c, ADL needs met with no c/o pain. Agreeable to go outside for some fresh air. She was escorted via w/c to the outdoor patio, husband accompanied [place]. She completed a stand pivot transfer with Min A using RW to one of the patio chairs where she engaged in a tabletop task focusing on strengthening of hand intrinsics. Specifically focused on 3 jaw chuck grasp and in-hand manipulations while pt assembled puzzle designs using geometric shapes of different sizes. She stacked 3 larger hexagonal shapes with increased time after 2 attempts. Min-Mod A for hand<finger translations with cues. She then ambulated using RW with Min A, using the Rt and orthosis while she transferred to a community bench, vcs for hand placement during stand<sit transition. After sitting for a few minutes in the sunshine, pt ambulated back to her w/c in the same manner as written above. She was returned to the room via w/c and left with all needs within reach, spouse at beside.   Therapy Documentation Precautions:  Precautions Precautions: Fall Precaution Comments: R hemiparesis, orthostasis Restrictions Weight Bearing Restrictions: No ADL: ADL Where Assessed-Eating: Wheelchair Grooming: Minimal assistance Where Assessed-Grooming: Sitting at sink Upper Body Bathing: Moderate assistance Where Assessed-Upper Body Bathing: Shower Lower Body Bathing: Maximal assistance Where Assessed-Lower Body Bathing: Shower, Sitting at sink Upper Body Dressing: Maximal assistance Where Assessed-Upper Body Dressing: Sitting at sink Lower Body Dressing: Maximal assistance Toileting: Maximal assistance Where Assessed-Toileting: Glass blower/designer:  Maximal assistance, Moderate assistance Toilet Transfer Method: Arts development officer: Energy manager: Doctor, general practice Method: Radiographer, therapeutic: Radio broadcast assistant      Therapy/Group: Individual Therapy  Courtney Coleman A Courtney Coleman 04/13/2020, 12:31 PM

## 2020-04-13 NOTE — Progress Notes (Signed)
Physical Therapy Session Note  Patient Details  Name: Courtney Coleman MRN: [id number] Date of Birth: 19-May-1966  Today's Date: 04/13/2020 PT Individual Time: 1300-1415 PT Individual Time Calculation (min): 75 min   Short Term Goals: Week 2:  PT Short Term Goal 1 (Week 2): Pt will completed bed mobility with supervision PT Short Term Goal 2 (Week 2): Pt will complete bed<>chair transfers with CGA and LRAD PT Short Term Goal 3 (Week 2): Pt will ambulate 17ft with minA and LRAD PT Short Term Goal 4 (Week 2): Pt will negotiate up/down x4 steps with minA and LRAD  Skilled Therapeutic Interventions/Progress Updates:    Pt received sitting in w/c, agreeable to PT session, no reports of pain. Fiance showed therapist pictures of home entrance and stairs to 2nd level. Home entrance is ~4inch step to enter home without rails and ~15 stairs to 2nd floor with 1 HR on the R during ascent. W/c transport for time management from her room to main therapy gym, placed in front of stairs. Negotiated up/down 2x4 steps (seated rest) with modA and 1 HR, performed via side stepping method with BUE on 1 HR (on R). She struggles with R foot placement during stair sequencing and providing L foot with enough space, worse with descent > ascent. Discussion held with patient who states she plans on residing on the 1st level, NOT the 2nd floor when she discharges home. She then ambulated 3x51ft (seated rest breaks) with RW and minA. Demo's decreased R foot clearance with intermittent toe drag, narrow BOS, and ocassional R knee hyperextension in stance however gait deficits improved since prior session! Performed curb training on 5inch platform to mimic home entrance, requiring minA and RW she was able to complete this x6 bouts. Required cues for RW management and sequencing with negotiation, anticipate need for family training on this at future date. She then completed gait training at HR, performing side-stepping L<>R 21ft each  direction requiring minA for stability, focus on engaging R hip abductor. W/c transport to dayroom gym, stand<>pivot transfer with minA to Nustep. Performed Nustep at x8 minutes with workload of 5, focusing on both BLE coordination and R knee strengthening to assist with  carryover into stability during functional tasks. Required minA for stand<>pivot back to her w/c and returned to her room where she remained seated in chair at end of session with needs in reach, chair alarm on.  Therapy Documentation Precautions:  Precautions Precautions: Fall Precaution Comments: R hemiparesis, orthostasis Restrictions Weight Bearing Restrictions: No  Therapy/Group: Individual Therapy   Kiki Bivens P Yordin Rhoda PT 04/13/2020, 1:27 PM

## 2020-04-13 NOTE — Plan of Care (Signed)
°  Problem: Consults Goal: RH BRAIN INJURY PATIENT EDUCATION Description: Description: See Patient Education module for eduction specifics Outcome: Progressing Goal: Skin Care Protocol Initiated - if Braden Score 18 or less Description: If consults are not indicated, leave blank or document N/A Outcome: Progressing Goal: Nutrition Consult-if indicated Outcome: Progressing Goal: Diabetes Guidelines if Diabetic/Glucose > 140 Description: If diabetic or lab glucose is > 140 mg/dl - Initiate Diabetes/Hyperglycemia Guidelines & Document Interventions  Outcome: Progressing   Problem: RH BOWEL ELIMINATION Goal: RH STG MANAGE BOWEL WITH ASSISTANCE Description: STG Manage Bowel with min Assistance. Outcome: Progressing   Problem: RH BLADDER ELIMINATION Goal: RH STG MANAGE BLADDER WITH ASSISTANCE Description: STG Manage Bladder With modi Assistance Outcome: Progressing   Problem: RH SKIN INTEGRITY Goal: RH STG SKIN FREE OF INFECTION/BREAKDOWN Description: With min assist Outcome: Progressing Goal: RH STG MAINTAIN SKIN INTEGRITY WITH ASSISTANCE Description: STG Maintain Skin Integrity With min Assistance. Outcome: Progressing   Problem: RH SAFETY Goal: RH STG ADHERE TO SAFETY PRECAUTIONS W/ASSISTANCE/DEVICE Description: STG Adhere to Safety Precautions With modi Assistance/Device. Outcome: Progressing Goal: RH STG DECREASED RISK OF FALL WITH ASSISTANCE Description: STG Decreased Risk of Fall With modi Assistance. Outcome: Progressing   Problem: RH COGNITION-NURSING Goal: RH STG USES MEMORY AIDS/STRATEGIES W/ASSIST TO PROBLEM SOLVE Description: STG Uses Memory Aids/Strategies With modi Assistance to Problem Solve. Outcome: Progressing Goal: RH STG ANTICIPATES NEEDS/CALLS FOR ASSIST W/ASSIST/CUES Description: STG Anticipates Needs/Calls for  Assist With modi Assistance/Cues. Outcome: Progressing   Problem: Consults Goal: RH STROKE PATIENT EDUCATION Description: See Patient  Education module for education specifics  Outcome: Progressing   Problem: Consults Goal: RH STROKE PATIENT EDUCATION Description: See Patient Education module for education specifics  Outcome: Progressing   Problem: RH KNOWLEDGE DEFICIT Goal: RH STG INCREASE KNOWLEDGE OF HYPERTENSION Description: Patient will be able to manage HTN with medications and diet modifications using handouts and educational handouts with cues/reminders Outcome: Progressing Goal: RH STG INCREASE KNOWLEGDE OF HYPERLIPIDEMIA Description: Patient will be able to manage HLD with medications and diet modifications using handouts and educational handouts with cues/reminders Outcome: Progressing Goal: RH STG INCREASE KNOWLEDGE OF STROKE PROPHYLAXIS Description: Patient will be able to manage secondary stroke risks and medications with cues/reminders and using handouts and educational information Outcome: Progressing

## 2020-04-13 NOTE — Progress Notes (Signed)
Speech Language Pathology Daily Session Note  Patient Details  Name: Courtney Coleman MRN: [id number] Date of Birth: [birth year]  Today's Date: 04/13/2020 SLP Individual Time: 0805-0900 SLP Individual Time Calculation (min): 55 min  Short Term Goals: Week 2: SLP Short Term Goal 1 (Week 2): Patient will describe object function at sentence level with 90% accuracy and minA cues for word-finding, fluency. SLP Short Term Goal 2 (Week 2): Patient will identify verbal/written errors of spelling and word use (not grammatical) with 80% accuracy and minA cues. SLP Short Term Goal 3 (Week 2): Patient will make inferences and/or describe meaning of familiar idioms at phrase level with minA for 80% accuracy. SLP Short Term Goal 4 (Week 2): Patient will verbally summarize after she or clinician reads aloud a short paragraph, with 85% accuracy and minA cues.   Skilled Therapeutic Interventions:Skilled ST services focused on language skills. SLP facilitated oral reading skills, summarization skills and spelling errors utilizing [employer] oral reading assessment subsections. Pt demonstrated reading fluency at paragraph level with supervision A verbal cues fade to mod I. Pt required mod A verbal cues fading to min A verbal cues (3/4th way through task) to summarize main idea verse listing details in order. SLP further facilitated summarization and concise thought in written task, pt was able to write main idea sentences with supervision A verbal cues for error awareness of verb tense and mod I for correction of spelling errors. Pt supported increase word finding skills, with noted deficits continuing in higher language tasks. Pt was left in room with call bell within reach and chair alarm set. SLP recommends to continue skilled services.     Pain Pain Assessment Pain Score: 0-No pain  Therapy/Group: Individual Therapy  Courtney Coleman  [facility] 04/13/2020, 9:20 AM

## 2020-04-13 NOTE — Progress Notes (Signed)
[place] PHYSICAL MEDICINE & REHABILITATION PROGRESS NOTE  Subjective/Complaints: Patient seen sitting up in a chair working with therapy this morning.  She states she slept well overnight.  She has questions regarding oncology follow-up.  Discussed with team as well as patient.  Plan for follow-up on 11/30-appointment rescheduled she states her bowels are moving well now.  ROS: Denies CP, SOB, N/V/D  Objective: Vital Signs: Blood pressure (!) 156/89, pulse 64, temperature 98.6 F (37 C), resp. rate 18, height 5\' 6"  (1.676 m), weight 107.5 kg, SpO2 99 %. No results found. Recent Labs    04/13/20 0616  WBC 5.8  HGB 10.9*  HCT 35.2*  PLT 258   Recent Labs    04/13/20 0616  NA 138  K 3.6  CL 106  CO2 24  GLUCOSE 101*  BUN 12  CREATININE 0.73  CALCIUM 9.0    Intake/Output Summary (Last 24 hours) at 04/13/2020 1915 Last data filed at 04/13/2020 1754 Gross per 24 hour  Intake 660 ml  Output 700 ml  Net -40 ml     Physical Exam: BP (!) 156/89 (BP Location: Left Arm)   Pulse 64   Temp 98.6 F (37 C)   Resp 18   Ht 5\' 6"  (1.676 m)   Wt 107.5 kg   SpO2 99%   BMI 38.25 kg/m   Constitutional: No distress . Vital signs reviewed. HENT: Normocephalic.  Atraumatic. Eyes: EOMI. No discharge. Cardiovascular: No JVD.  RRR. Respiratory: Normal effort.  No stridor.  Bilateral clear to auscultation. GI: Non-distended.  BS +. Skin: Warm and dry.  Intact. Psych: Normal mood.  Normal behavior. Musc: No edema in extremities.  No tenderness in extremities. Neuro: Alert Mild right facial weakness, stable Motor: LUE/LLE: 5/5 proximal distal RUE: 4+/5 proximal distal with apraxia, stable RLE: 4+/5 proximal distal, unchanged  Assessment/Plan: 1. 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  Physiatrist is providing close team supervision and 24 hour management of active medical problems listed below.  Physiatrist and rehab team continue to assess barriers to discharge/monitor patient progress toward functional and medical goals   Care Tool:  Bathing    Body parts bathed by patient: Right arm, Left arm, Chest, Abdomen, Front perineal area, Buttocks, Right upper leg, Left upper leg, Face, Left lower leg   Body parts bathed by helper: Right lower leg     Bathing assist Assist Level: Minimal Assistance - Patient > 75%     Upper Body Dressing/Undressing Upper body dressing   What is the patient wearing?: Pull over shirt, Hospital gown only    Upper body assist Assist Level: Set up assist    Lower Body Dressing/Undressing Lower body dressing      What is the patient wearing?: Pants, Incontinence brief     Lower body assist Assist for lower body dressing: Contact Guard/Touching assist     Toileting Toileting    Toileting assist Assist for toileting: Minimal Assistance - Patient > 75%     Transfers Chair/bed transfer  Transfers assist     Chair/bed transfer assist level: Minimal Assistance - Patient > 75% Chair/bed transfer assistive device: Programmer, multimedia   Ambulation assist      Assist level: 2 helpers (+2 min A) Assistive device: Hand held assist  Max distance: 113ft   Walk 10 feet activity   Assist     Assist level: 2 helpers (+2 min A) Assistive device: Hand held assist   Walk 50 feet activity   Assist Walk 50 feet with 2 turns activity did not occur: Safety/medical concerns  Assist level: 2 helpers (+2 min A) Assistive device: Hand held assist    Walk 150 feet activity   Assist Walk 150 feet activity did not occur: Safety/medical concerns  Assist level: Minimal Assistance - Patient > 75% Assistive device: Walker-rolling    Walk 10 feet on uneven  surface  activity   Assist Walk 10 feet on uneven surfaces activity did not occur: Safety/medical concerns         Wheelchair     Assist Will patient use wheelchair at discharge?: No             Wheelchair 50 feet with 2 turns activity    Assist            Wheelchair 150 feet activity     Assist           Medical Problem List and Plan: 1.  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    Continue CIR  Lipid profile within normal limits on 11/9 2.  Antithrombotics: -DVT/anticoagulation:  Pharmaceutical: Other (comment)--now on Eliquis.              -antiplatelet therapy: N/A 3. Pain Management: Tylenol prn.  4. Mood: LCSW to follow for evaluation and support.              -antipsychotic agents: N/A 5. Neuropsych: This patient is capable of making decisions on her own behalf. 6. Skin/Wound Care: Routine pressure relief measures.  7. Fluids/Electrolytes/Nutrition: Monitor I/Os.   8. HTN: Monitor BP. Avoid LBP due to L-PCA stenosis  Appreciate cardiology recs  Norvasc 2.5 started on 11/11, increased to 5 on 11/14  Elevated on 11/15, would not make further adjustments today given recent increase             Monitor with increased mobility 9. A fib s/p cardioversion: Monitor HR.             Monitor with increased exertion  Heart rate relatively controlled on 11/15  Appreciate cardiology recs 10. T2DM with hyperglycemia: Hgb A1c- 7.2. Monitor BS ac/hs. Diabetic education  ?  Trending up on 11/15, will consider medication adjustments if persistent  Continue to monitor 11. Breast cancer: Arimidex--diagnosed in August but not had follow up due to multiple admissions secondary to cardiac issues. Family has been  requesting referral to Hem/onc/surgeon in town-appointment on 11/30 12. ABLA  Hb 10.9 on 11/15  Cont to monitor 13. Slow transit constipation  Bowel meds increased on 11/10  Improving  LOS: 12 days A FACE TO FACE EVALUATION WAS PERFORMED  Christie Copley Lorie Phenix 04/13/2020, 7:15 PM

## 2020-04-14 ENCOUNTER — Inpatient Hospital Stay (HOSPITAL_COMMUNITY): Payer: No Typology Code available for payment source | Admitting: Occupational Therapy

## 2020-04-14 ENCOUNTER — Inpatient Hospital Stay (HOSPITAL_COMMUNITY): Payer: No Typology Code available for payment source

## 2020-04-14 LAB — GLUCOSE, CAPILLARY
Glucose-Capillary: 105 mg/dL — ABNORMAL HIGH (ref 70–99)
Glucose-Capillary: 130 mg/dL — ABNORMAL HIGH (ref 70–99)
Glucose-Capillary: 302 mg/dL — ABNORMAL HIGH (ref 70–99)
Glucose-Capillary: 87 mg/dL (ref 70–99)

## 2020-04-14 NOTE — Progress Notes (Signed)
Occupational Therapy Session Note  Patient Details  Name: Courtney Coleman MRN: [id number] Date of Birth: July 10, 1965  Today's Date: 04/14/2020 OT Individual Time: 1120-1206 OT Individual Time Calculation (min): 46 min    Short Term Goals: Week 1:  OT Short Term Goal 1 (Week 1): Pt will recall functional place to put right UE during grooming tasks with min questioning cues OT Short Term Goal 1 - Progress (Week 1): Met OT Short Term Goal 2 (Week 1): Pt will perform sit to stands with contact guard with recall with min A for feet placement OT Short Term Goal 2 - Progress (Week 1): Progressing toward goal OT Short Term Goal 3 (Week 1): Pt will don shirt with min A with hemi dressing techniques OT Short Term Goal 3 - Progress (Week 1): Met OT Short Term Goal 4 (Week 1): Pt will thread underwear and pants with min A with extra time OT Short Term Goal 4 - Progress (Week 1): Progressing toward goal Week 2:  OT Short Term Goal 1 (Week 2): Pt will complete LB dressing with min assist. OT Short Term Goal 2 (Week 2): Pt will complete sit<>stand with CGA and safe descent in prep for LB self care. OT Short Term Goal 3 (Week 2): Pt will brush teeth using right dominant hand with supervision.      Skilled Therapeutic Interventions/Progress Updates:    Pt received in w/c with her fiance in the room.  Pt agreeable to working on balance, RUE Dowell and R visual tracking control. Reassessed tracking and pt used additional head turns to follow target to her right.  Pt taken to day room.  Worked on shoulder coordination and B coordination with 2# dowel bar for arm reaches forward and large slow circular rows forward and back.    In standing, pt worked with the [employer] system and used several programs for visual tracking, dynamic reaching (across midline) and Duncan.  She had quite a bit of difficulty maintaining R pointer finger extension. She could achieve it but had difficulty holding it in extension.  Used a stylus  for some of the activities.  Pt stood for over 20 min.   Pt taken back to room and worked with her super soft putty for finger extension exercises and isometric extension of pointer finger with pushing holes in putty.  Recommended she continue this in her free time later today.  Pt resting in wc with belt alarm on and all needs met.  Therapy Documentation Precautions:  Precautions Precautions: Fall Precaution Comments: R hemiparesis, orthostasis Restrictions Weight Bearing Restrictions: No  Pain: Pain Assessment Pain Score: 0-No pain    Therapy/Group: Individual Therapy  [place] 04/14/2020, 12:47 PM

## 2020-04-14 NOTE — Progress Notes (Signed)
Speech Language Pathology Weekly Progress and Session Note  Patient Details  Name: Courtney Coleman MRN: [id number] Date of Birth: 09/15/1965  Beginning of progress report period: April 08, 2020 End of progress report period: April 14, 2020  Today's Date: 04/14/2020 SLP Individual Time: 0086-7619 SLP Individual Time Calculation (min): 28 min  Short Term Goals: Week 2: SLP Short Term Goal 1 (Week 2): Patient will describe object function at sentence level with 90% accuracy and minA cues for word-finding, fluency. SLP Short Term Goal 1 - Progress (Week 2): Met SLP Short Term Goal 2 (Week 2): Patient will identify verbal/written errors of spelling and word use (not grammatical) with 80% accuracy and minA cues. SLP Short Term Goal 2 - Progress (Week 2): Met SLP Short Term Goal 3 (Week 2): Patient will make inferences and/or describe meaning of familiar idioms at phrase level with minA for 80% accuracy. SLP Short Term Goal 3 - Progress (Week 2): Met SLP Short Term Goal 4 (Week 2): Patient will verbally summarize after she or clinician reads aloud a short paragraph, with 85% accuracy and minA cues. SLP Short Term Goal 4 - Progress (Week 2): Met    New Short Term Goals: Week 3: SLP Short Term Goal 1 (Week 3): STG=LTG due to short ELOS  Weekly Progress Updates:Pt made great progress meeting 4 out 4 goals this reporting period. Pt demonstrates supervision A - Mod I use of word finding skills in conversation. Pt requires extra cuing and time when presented with higher level language tasks such as summarizing, making inferences, understanding proverbs. Pt demonstrated ability to self-correct spelling errors. Pt would continue to benefit from skilled ST services in order to maximize functional independence and reduce burden of care, with continued skilled ST services to work on higher level language.       Intensity: Minumum of 1-2 x/day, 30 to 90 minutes Frequency: 1 to 3 out of 7  days Duration/Length of Stay: 11/24 Treatment/Interventions: Cueing hierarchy;Cognitive remediation/compensation;Speech/Language facilitation;Patient/family education   Daily Session  Skilled Therapeutic Interventions: Skilled ST services focused on education and language skills. Pt demonstrated ability to describe object function of items in room  Mod I. SLP facilitated higher level language skills in explaining proverbs in which, pt was able to explain when used in a sentence. Pt demonstrated ability to make inferences after listening to short paragraphs. SLP provided homework to continue to work on identifying meanings of proverbs with fiance and provided instruction for cuing. Pt's fiance demonstrated appropriate cuing with supervision A verbal cues. Pt was left in room with fiance, call bell within reach and chair alarm set. SLP recommends to continue skilled services.    General    Pain Pain Assessment Pain Score: 0-No pain  Therapy/Group: Individual Therapy  Ali Mclaurin  [facility] 04/14/2020, 4:29 PM

## 2020-04-14 NOTE — Progress Notes (Signed)
[place] PHYSICAL MEDICINE & REHABILITATION PROGRESS NOTE  Subjective/Complaints: Patient seen sitting up, working with therapy this morning.  Good sitting balance noted.  She states she slept well overnight.  ROS: Denies CP, SOB, N/V/D  Objective: Vital Signs: Blood pressure (!) 149/69, pulse (!) 54, temperature 98.4 F (36.9 C), temperature source Oral, resp. rate 18, height 5\' 6"  (1.676 m), weight 107.5 kg, SpO2 97 %. No results found. Recent Labs    04/13/20 0616  WBC 5.8  HGB 10.9*  HCT 35.2*  PLT 258   Recent Labs    04/13/20 0616  NA 138  K 3.6  CL 106  CO2 24  GLUCOSE 101*  BUN 12  CREATININE 0.73  CALCIUM 9.0    Intake/Output Summary (Last 24 hours) at 04/14/2020 1239 Last data filed at 04/13/2020 1754 Gross per 24 hour  Intake 440 ml  Output --  Net 440 ml     Physical Exam: BP (!) 149/69 (BP Location: Left Arm)   Pulse (!) 54   Temp 98.4 F (36.9 C) (Oral)   Resp 18   Ht 5\' 6"  (1.676 m)   Wt 107.5 kg   SpO2 97%   BMI 38.25 kg/m   Constitutional: No distress . Vital signs reviewed. HENT: Normocephalic.  Atraumatic. Eyes: EOMI. No discharge. Cardiovascular: No JVD.  RRR. Respiratory: Normal effort.  No stridor.  Bilateral clear to auscultation. GI: Non-distended.  BS +. Skin: Warm and dry.  Intact. Psych: Normal mood.  Normal behavior. Musc: No edema in extremities.  No tenderness in extremities. Neuro: Alert Mild right facial weakness, stable Motor: LUE/LLE: 5/5 proximal distal RUE: 4+/5 proximal distal with apraxia, unchanged RLE: 4+/5 proximal distal, unchanged No increase in tone appreciated  Assessment/Plan: 1. 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  Physiatrist is providing close team  supervision and 24 hour management of active medical problems listed below.  Physiatrist and rehab team continue to assess barriers to discharge/monitor patient progress toward functional and medical goals   Care Tool:  Bathing    Body parts bathed by patient: Right arm, Left arm, Chest, Abdomen, Front perineal area, Buttocks, Right upper leg, Left upper leg, Face, Left lower leg   Body parts bathed by helper: Right lower leg     Bathing assist Assist Level: Minimal Assistance - Patient > 75%     Upper Body Dressing/Undressing Upper body dressing   What is the patient wearing?: Pull over shirt, Hospital gown only    Upper body assist Assist Level: Set up assist    Lower Body Dressing/Undressing Lower body dressing      What is the patient wearing?: Pants, Incontinence brief     Lower body assist Assist for lower body dressing: Contact Guard/Touching assist     Toileting Toileting    Toileting assist Assist for toileting: Minimal Assistance - Patient > 75%     Transfers Chair/bed transfer  Transfers assist     Chair/bed transfer assist level: Minimal Assistance - Patient > 75% Chair/bed transfer assistive device: Programmer, multimedia   Ambulation assist      Assist level: 2 helpers (+2 min A) Assistive device: Hand held assist Max distance: 114ft   Walk 10 feet activity   Assist     Assist level:  2 helpers (+2 min A) Assistive device: Hand held assist   Walk 50 feet activity   Assist Walk 50 feet with 2 turns activity did not occur: Safety/medical concerns  Assist level: 2 helpers (+2 min A) Assistive device: Hand held assist    Walk 150 feet activity   Assist Walk 150 feet activity did not occur: Safety/medical concerns  Assist level: Minimal Assistance - Patient > 75% Assistive device: Walker-rolling    Walk 10 feet on uneven surface  activity   Assist Walk 10 feet on uneven surfaces activity did not occur:  Safety/medical concerns         Wheelchair     Assist Will patient use wheelchair at discharge?: No             Wheelchair 50 feet with 2 turns activity    Assist            Wheelchair 150 feet activity     Assist           Medical Problem List and Plan: 1.  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    Continue CIR  Lipid profile within normal limits on 11/9 2.  Antithrombotics: -DVT/anticoagulation:  Pharmaceutical: Other (comment)--now on Eliquis.              -antiplatelet therapy: N/A 3. Pain Management: Tylenol prn.  4. Mood: LCSW to follow for evaluation and support.              -antipsychotic agents: N/A 5. Neuropsych: This patient is capable of making decisions on her own behalf. 6. Skin/Wound Care: Routine pressure relief measures.  7. Fluids/Electrolytes/Nutrition: Monitor I/Os.   8. HTN: Monitor BP. Avoid LBP due to L-PCA stenosis  Appreciate cardiology recs  Norvasc 2.5 started on 11/11, increased to 5 on 11/14  Remains elevated on 11/16, will consider further increase in medications tomorrow if persistent             Monitor with increased mobility 9. A fib s/p cardioversion: Monitor HR.             Monitor with increased exertion  Heart rate relatively controlled on 11/16  Appreciate cardiology recs 10. T2DM with hyperglycemia: Hgb A1c- 7.2. Monitor BS ac/hs. Diabetic education  Remains slightly elevated on 11/16, will consider medications  Continue to monitor 11. Breast cancer: Arimidex--diagnosed in August but not had follow up due to multiple admissions secondary to cardiac issues. Family has been requesting referral to Hem/onc/surgeon in town-appointment on 11/30 12. ABLA  Hb 10.9 on 11/15  Cont to monitor 13. Slow transit  constipation  Bowel meds increased on 11/10  Improving  LOS: 13 days A FACE TO FACE EVALUATION WAS PERFORMED  Courtney Coleman 04/14/2020, 12:39 PM

## 2020-04-14 NOTE — Progress Notes (Signed)
Occupational Therapy Session Note  Patient Details  Name: Courtney Coleman MRN: [id number] Date of Birth: 1965/11/09  Today's Date: 04/14/2020 OT Individual Time: 1962-2297 ; and 1448-1530 OT Individual Time Calculation (min): 45 min; and 42 min   Short Term Goals: Week 2:  OT Short Term Goal 1 (Week 2): Pt will complete LB dressing with min assist. OT Short Term Goal 2 (Week 2): Pt will complete sit<>stand with CGA and safe descent in prep for LB self care. OT Short Term Goal 3 (Week 2): Pt will brush teeth using right dominant hand with supervision.  Skilled Therapeutic Interventions/Progress Updates:    First session: Pt sitting up in w/c, reporting no pain and wants to defer self care due to having washed up with husband, requesting to work on right hand motor control.  Pt self propelled w/c needing max VCs to increase use of right upper extremity to maneuver in hallway.  Pt completed biceps curls with 3 lb and forearm pro/sup 1 lb free weight x 15 reps. Pt needing max VCs and intermittent TCs to improve controlled movement during both therex's.  Pt completed right 3 jaw chuck pinch activity using min resistive clothespins.  Pt demonstrated increased speed and decreased dropping compared to last time task completed.  Pt completed standing fine motor control and functional reach task using right upper extremity needing only supervision for standing x 20 minutes without rest breaks.  Pt having difficulty utilizing index and middle finger together for functional pinching tasks and dropping medium sized checker pieces, requiring VCs and visual demonstration to facilitate improved positioning and movement.  Pt transported back to room, call bell in reach, seat belt alarm on.  Second session: Pt sitting up in wc, no c/o pain.  Pt transported to ortho gym for time mgt.  Pt completed Stand pivot w/c to nustep with CGA.  Pt completed x 8 mins on resist level 5 using BUE and BLE.  Pt needed frequent VCs to  attend to right hand due to weak grip and slipping off handlebar.  Pt completed stand pivot to w/c with min assist due to slow rapid descent.  Pt participated in seated functional reach and fine motor precision task at [employer].  Pt needing intermittent multimodal cues to facilitate functional point and touch position.  Pt exhibited improved motor control with use of middle and ring finger to stabilize hand on board due to impaired proprioception without.  Pt self propelled w/c approx 30 feet using BUE with intermittent VCs and TCs to facilitate improved gross grip right hand.  Pt returned to room, call bell in reach, seat alarm on.  Therapy Documentation Precautions:  Precautions Precautions: Fall Precaution Comments: R hemiparesis, orthostasis Restrictions Weight Bearing Restrictions: No   Therapy/Group: Individual Therapy  Ezekiel Slocumb 04/14/2020, 10:59 AM

## 2020-04-14 NOTE — Progress Notes (Signed)
Physical Therapy Session Note  Patient Details  Name: Courtney Coleman MRN: [id number] Date of Birth: [birth year]  Today's Date: 04/14/2020 PT Individual Time: 0800-0915 PT Individual Time Calculation (min): 75 min   Short Term Goals: Week 1:  PT Short Term Goal 1 (Week 1): Pt will complete bed mobility with minA PT Short Term Goal 2 (Week 1): Pt will perform bed<>chair transfers with minA and LRAD PT Short Term Goal 3 (Week 1): Pt will ambulate 43ft with minA and LRAD PT Short Term Goal 4 (Week 1): Pt will negotiate up/down x4 steps with modA and LRAD  Skilled Therapeutic Interventions/Progress Updates:    Pt received sitting in w/c, agreeable to PT session, no reports of pain. W/c transport for time management to main therapy gym. Performed squat<>pivot with CGA from w/c to mat table. Performed NMR for R knee stability including step-ups on 3inch platform with HHA and minA for RLE and modA for LLE. She also performed lateral step-ups in similar fashion as listed above, increased difficulty with stepping up with LLE due to inability to maintain R knee stability and would hyperextend her knee. Performed repeated sit<>stands with CGA and no AD, placing LLE anteriorly to bias RLE. She also performed 5xSTS x3 bouts, completing in 14.5 seconds, 15 seconds, and 13.5 seconds respectively. She then completed the TUG x3 bouts, with minA guard and RW, scoring 25 seconds, 27 seconds, and 28 seconds respectively. Completed blocked practice squat<>pivot transfers from mat table to w/c with CGA, cues for "checklist" and general safety awareness. Performed gait training, 4x127ft (seated rest) with mostly minA and RW, demo's decreased R foot clearance, L veering with RW, and occasional R knee hyperextension. She also completed obstacle training with cones separated ~39ft apart, completing this with RW and minA guard and cues for wide turns to avoid cones. Returned to her room where she remained seated in chair with  fiance at bedside, needs in reach.   Therapy Documentation Precautions:  Precautions Precautions: Fall Precaution Comments: R hemiparesis, orthostasis Restrictions Weight Bearing Restrictions: No  Therapy/Group: Individual Therapy   Sahily Biddle P Blanchie Zeleznik PT 04/14/2020, 9:26 AM

## 2020-04-15 ENCOUNTER — Inpatient Hospital Stay (HOSPITAL_COMMUNITY): Payer: No Typology Code available for payment source

## 2020-04-15 ENCOUNTER — Inpatient Hospital Stay (HOSPITAL_COMMUNITY): Payer: No Typology Code available for payment source | Admitting: Occupational Therapy

## 2020-04-15 LAB — GLUCOSE, CAPILLARY
Glucose-Capillary: 96 mg/dL (ref 70–99)
Glucose-Capillary: 117 mg/dL — ABNORMAL HIGH (ref 70–99)
Glucose-Capillary: 134 mg/dL — ABNORMAL HIGH (ref 70–99)
Glucose-Capillary: 108 mg/dL — ABNORMAL HIGH (ref 70–99)

## 2020-04-15 NOTE — Progress Notes (Signed)
Speech Language Pathology Daily Session Note  Patient Details  Name: Courtney Coleman MRN: [id number] Date of Birth: January 20, 1966  Today's Date: 04/15/2020 SLP Individual Time: 1010-1100 SLP Individual Time Calculation (min): 50 min  Short Term Goals: Week 3: SLP Short Term Goal 1 (Week 3): STG=LTG due to short ELOS  Skilled Therapeutic Interventions:Skilled ST services focused on education and language skills. SLP facilitated word finding skill in abstract language tasks, such as analogies, inferences following short paragraphs and divergent naming tasks with supervision A semantic cues. Pt demonstrated improvement in divergent naming tasks of naming animals following instruction of associations and subcategories, with ability to name 16 with in a minute with supervision A semantic cues from fiance. SLP provided education to pt and fiance pertaining to proverb homework and levels of cuing. He was able to return demonstration of cuing within proverb task.  Pt was left in room with fiance, call bell within reach and chair alarm set. SLP recommends to continue skilled services.     Pain    Therapy/Group: Individual Therapy  Aibhlinn Kalmar  [facility] 04/15/2020, 7:38 AM

## 2020-04-15 NOTE — Progress Notes (Signed)
Physical Therapy Session Note  Patient Details  Name: Courtney Coleman MRN: [id number] Date of Birth: 09-05-1965  Today's Date: 04/15/2020 PT Individual Time: 0800-0858 PT Individual Time Calculation (min): 58 min   Short Term Goals: Week 2:  PT Short Term Goal 1 (Week 2): Pt will completed bed mobility with supervision PT Short Term Goal 2 (Week 2): Pt will complete bed<>chair transfers with CGA and LRAD PT Short Term Goal 3 (Week 2): Pt will ambulate 138ft with minA and LRAD PT Short Term Goal 4 (Week 2): Pt will negotiate up/down x4 steps with minA and LRAD  Skilled Therapeutic Interventions/Progress Updates:    Pt received sitting in w/c, agreeable to PT session, no reports of pain. W/c transport for time management to main therpay gym. Squat<>pivot transfer with CGA from w/c to mat table. Performed RLE NMR in both sitting and standing: -1x10 seated LAQ with 3# ankle weight -1x10 seated hip flex with 3# ankle weight -1x10 standing RLE hamstring curls with RW support -1x10 standing hip abduction with RW support -1x10 mini-squats focusing on RLE control and stability *therapist cueing for correct muscle activation and sequencing throughout  She ambulated 175ft with minA and RW on level surfaces, continue to shoe decreased R knee stability and occasional R toe drag; cues provided for correcting. After seated rest, trial of gait with no AD. She ambulated 116ft with HHA and modA, gait deficits include what was written above with the addition of step-to gait pattern, increased R lateral lean, and increased instances of R knee hyperextension. Educated her on importance of RW use of mobility until deficits improve, she voiced understanding. Performed dynamic standing balance with instruction on placing clothes pins with RUE to net that was placed ~25ft high, biasing her R side to promote R weight shift, requiring minA guard for balance. Performed squat<>pivot transfer with CGA from mat table back to  w/c. Focused remainder of session on w/c propulsion for strengthening activity tolerance and RUE dexterity/motor control for neuro re ed. She required minA for propelling ~190ft and required cues for RUE placement and increased R shldr extension to improve stroke efficiency. Decreased RUE proprioception leading to impaired RUE placement and often missing the rim and grasping the wheel instead. Returned to her room where she remained seated in w/c at end of session, fiance at bedside, needs in reach. Updated Fiance on progress with mobility, both appear pleased with progress.  Therapy Documentation Precautions:  Precautions Precautions: Fall Precaution Comments: R hemiparesis, orthostasis Restrictions Weight Bearing Restrictions: No  Therapy/Group: Individual Therapy  Tricia [place] P Audray Rumore PT 04/15/2020, 7:50 AM

## 2020-04-15 NOTE — Progress Notes (Signed)
Patient ID: Courtney Coleman, female   DOB: 10-Feb-1966, 54 y.o.   MRN: [id number]  Met with pt and finance to inform of team conference progress toward her goals and discharge still 11/24. She is pleased with her progress thus far. Finance is here and assist with her care while here. Discussed will begin looking for equipment since she has none and her insurance is limited. Continue to work on discharge needs.

## 2020-04-15 NOTE — Progress Notes (Addendum)
Occupational Therapy Session Note  Patient Details  Name: Courtney Coleman MRN: [id number] Date of Birth: December 04, 1965  Today's Date: 04/15/2020 OT Individual Time: 1500-1530 OT Individual Time Calculation (min): 30 min    Short Term Goals: Week 2:  OT Short Term Goal 1 (Week 2): Pt will complete LB dressing with min assist. OT Short Term Goal 2 (Week 2): Pt will complete sit<>stand with CGA and safe descent in prep for LB self care. OT Short Term Goal 3 (Week 2): Pt will brush teeth using right dominant hand with supervision.  Skilled Therapeutic Interventions/Progress Updates:    Pt sitting up in w/c, no c/o pain.  Pt completed oral hygiene standing sinkside with supervision and noted improved functional use of right hand and dynamic standing balance.  Pt completed doffing/donning of bilateral shoes including untying/tying shoelaces with supervision only and increased time.  Pt completed blocked practice of clothing mgt to pull pants up/down over hips with increased use of right hand and CGA for sit<>stand at RW. Improved overall fine motor and gross motor control today right hand during functional tasks.  Still needs increased time to complete due to remaining [facility] and strength impairments. Call bell in reach, seat belt alarm on.  Therapy Documentation Precautions:  Precautions Precautions: Fall Precaution Comments: R hemiparesis, orthostasis Restrictions Weight Bearing Restrictions: No   Therapy/Group: Individual Therapy  Ezekiel Slocumb 04/15/2020, 4:40 PM

## 2020-04-15 NOTE — Progress Notes (Signed)
[place] PHYSICAL MEDICINE & REHABILITATION PROGRESS NOTE  Subjective/Complaints: Patient seen sitting up, working with therapy this morning.  She states she slept well overnight.  She states she had a question but cannot recall at this time.  ROS: Denies CP, SOB, N/V/D  Objective: Vital Signs: Blood pressure (!) 172/77, pulse (!) 58, temperature 98 F (36.7 C), temperature source Oral, resp. rate 18, height 5\' 6"  (1.676 m), weight 104.5 kg, SpO2 98 %. No results found. Recent Labs    04/13/20 0616  WBC 5.8  HGB 10.9*  HCT 35.2*  PLT 258   Recent Labs    04/13/20 0616  NA 138  K 3.6  CL 106  CO2 24  GLUCOSE 101*  BUN 12  CREATININE 0.73  CALCIUM 9.0    Intake/Output Summary (Last 24 hours) at 04/15/2020 1048 Last data filed at 04/15/2020 0700 Gross per 24 hour  Intake 220 ml  Output --  Net 220 ml     Physical Exam: BP (!) 172/77 (BP Location: Right Arm)   Pulse (!) 58   Temp 98 F (36.7 C) (Oral)   Resp 18   Ht 5\' 6"  (1.676 m)   Wt 104.5 kg   SpO2 98%   BMI 37.18 kg/m   Constitutional: No distress . Vital signs reviewed. HENT: Normocephalic.  Atraumatic. Eyes: EOMI. No discharge. Cardiovascular: No JVD.  RRR. Respiratory: Normal effort.  No stridor.  Bilateral clear to auscultation. GI: Non-distended.  BS +. Skin: Warm and dry.  Intact. Psych: Normal mood.  Normal behavior. Musc: No edema in extremities.  No tenderness in extremities. Neuro: Alert Mild right facial weakness, stable Motor: LUE/LLE: 5/5 proximal distal RUE: 4+/5 proximal distal with apraxia, stable RLE: 4+/5 proximal distal, unchanged No increase in tone appreciated  Assessment/Plan: 1. 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  Physiatrist is providing close team supervision and 24 hour management of active medical problems listed below.  Physiatrist and rehab team continue to assess barriers to discharge/monitor patient progress toward functional and medical goals   Care Tool:  Bathing    Body parts bathed by patient: Right arm, Left arm, Chest, Abdomen, Front perineal area, Buttocks, Right upper leg, Left upper leg, Face, Left lower leg   Body parts bathed by helper: Right lower leg     Bathing assist Assist Level: Minimal Assistance - Patient > 75%     Upper Body Dressing/Undressing Upper body dressing   What is the patient wearing?: Pull over shirt, Hospital gown only    Upper body assist Assist Level: Set up assist    Lower Body Dressing/Undressing Lower body dressing      What is the patient wearing?: Pants, Incontinence brief     Lower body assist Assist for lower body dressing: Contact Guard/Touching assist     Toileting Toileting    Toileting assist Assist for toileting: Minimal Assistance - Patient > 75%     Transfers Chair/bed transfer  Transfers assist     Chair/bed transfer assist level: Minimal Assistance - Patient > 75% Chair/bed transfer assistive device: Programmer, multimedia   Ambulation assist      Assist level: 2 helpers (+2 min A) Assistive device: Hand held assist Max distance: 197ft   Walk 10 feet activity  Assist     Assist level: 2 helpers (+2 min A) Assistive device: Hand held assist   Walk 50 feet activity   Assist Walk 50 feet with 2 turns activity did not occur: Safety/medical concerns  Assist level: 2 helpers (+2 min A) Assistive device: Hand held assist    Walk 150 feet activity   Assist Walk 150 feet activity did not occur: Safety/medical concerns  Assist level: Minimal Assistance - Patient > 75% Assistive device: Walker-rolling    Walk 10 feet on uneven surface  activity   Assist Walk 10 feet on  uneven surfaces activity did not occur: Safety/medical concerns         Wheelchair     Assist Will patient use wheelchair at discharge?: No             Wheelchair 50 feet with 2 turns activity    Assist            Wheelchair 150 feet activity     Assist           Medical Problem List and Plan: 1.  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    Continue CIR  Lipid profile within normal limits on 11/9  Team conference today to discuss current and goals and coordination of care, home and environmental barriers, and discharge planning with nursing, case manager, and therapies. Please see conference note from today as well.  2.  Antithrombotics: -DVT/anticoagulation:  Pharmaceutical: Other (comment)--now on Eliquis.              -antiplatelet therapy: N/A 3. Pain Management: Tylenol prn.  4. Mood: LCSW to follow for evaluation and support.              -antipsychotic agents: N/A 5. Neuropsych: This patient is capable of making decisions on her own behalf. 6. Skin/Wound Care: Routine pressure relief measures.  7. Fluids/Electrolytes/Nutrition: Monitor I/Os.   8. HTN: Monitor BP. Avoid LBP due to L-PCA stenosis  Appreciate cardiology recs  Norvasc 2.5 started on 11/11, increased to 5 on 11/14  Was relatively controlled, however?  Trending up on 11/17, continue to follow             Monitor with increased mobility 9. A fib s/p cardioversion: Monitor HR.             Monitor with increased exertion  Heart rate relatively controlled on 11/17  Appreciate cardiology recs 10. T2DM with hyperglycemia: Hgb A1c- 7.2. Monitor BS ac/hs. Diabetic education  Labile on 11/17, monitor for trend  Continue to monitor 11. Breast cancer: Arimidex--diagnosed in August but not had  follow up due to multiple admissions secondary to cardiac issues. Family has been requesting referral to Hem/onc/surgeon in town-appointment on 11/30 12. ABLA  Hb 10.9 on 11/15  Cont to monitor 13. Slow transit constipation  Bowel meds increased on 11/10  Improving  LOS: 14 days A FACE TO FACE EVALUATION WAS PERFORMED  Jakevion Arney Lorie Phenix 04/15/2020, 10:48 AM

## 2020-04-15 NOTE — Patient Care Conference (Signed)
Inpatient RehabilitationTeam Conference and Plan of Care Update Date: 04/15/2020   Time: 11:09 AM    Patient Name: Courtney Coleman      Medical Record Number: [id number]  Date of Birth: [birth year] Sex: Female         Room/Bed: 4W25C/4W25C-01 Payor Info: Payor: [employer] MULTIPLAN / Plan: MULTIPLAN [employer]CS / Product Type: *No Product type* /    Admit Date/Time:  04/01/2020  3:10 PM  Primary Diagnosis:  Thalamic stroke [facility])  Hospital Problems: Principal Problem:   Thalamic stroke ([place]) Active Problems:   Acute blood loss anemia   Benign essential HTN   Slow transit constipation   Bradycardia   Malignant neoplasm of left female breast [facility])    Expected Discharge Date: Expected Discharge Date: 04/22/20  Team Members Present: Physician leading conference: Dr. Delice Lesch Care Coodinator Present: Dorien Chihuahua, RN, BSN, CRRN;Becky Dupree, LCSW Nurse Present: Annita Brod, LPN PT Present: Ginnie Smart, PT OT Present: Leretha Pol, OT SLP Present: Charolett Bumpers, SLP PPS Coordinator present : Gunnar Fusi, Novella Olive, PT     Current Status/Progress Goal Weekly Team Focus  Bowel/Bladder   continent of bowel and bladder LBM 11/16  timed toileting while awake  assess toileting needs qshift and PRN   Swallow/Nutrition/ Hydration             ADL's   CGA functional transfers, supervision UB ADLs, min A LB ADLs  supervision  functional transfers RUE NMR, self care training, RUE strengthening   Mobility   minA bed mobility, minA stand<>pivot, minA gait ~171ft with RW, modA 4 stairs  supervision/CGA  RLE NMR, functional transfers, gait progressions, standing balance.   Communication   Supervision-Min A  Mod I word finding. Supervision A spelling  education and higher level/abstract word finding   Safety/Cognition/ Behavioral Observations            Pain   no c/o pain  remain free of pain  assess pain qshift and PRN   Skin   no skin impairments  remian free of skin  impsirments  assess skin qshift and PRN     Discharge Planning:  Fiance here daily and has observed in therapies. Plan to go to niece's home and fiance to assist with her care. PCP & Oncology appointments made for follow up   Team Discussion: MD notes CBG and BP fluctuations; adjusting medications. Patient is continent of bowel and bladder and able to ambulate with a rolling walker and min assist. Progress impaired by poor safety awareness, right hand fine motor issues and right leg buckling. Patient struggles with use of the walker and runs into furniture/items in the hall. Patient on target to meet rehab goals: yes   *See Care Plan and progress notes for long and short-term goals.   Revisions to Treatment Plan:   Teaching Needs: Transfers, safety awareness cues, medications, etc.  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  Barriers to Discharge: Medical stability;Weight  Barriers to Discharge Comments: No insurance for DME Possible Resolutions to [employer] Focus: Therapies,  follow labs -Hb, optimize DM/BP meds   Continued Need for Acute Rehabilitation Level of Care: The patient requires daily medical management by a physician with specialized training in physical medicine and rehabilitation for the following reasons: Direction of a multidisciplinary physical rehabilitation program to maximize functional independence : Yes Medical management of patient stability for increased activity  during participation in an intensive rehabilitation regime.: Yes Analysis of laboratory values and/or radiology reports with any subsequent need for medication adjustment and/or medical intervention. : Yes   I attest that I was present, lead the team conference, and concur with the assessment and plan of the team.   Dorien Chihuahua B 04/15/2020, 1:56 PM

## 2020-04-15 NOTE — Progress Notes (Signed)
Occupational Therapy Session Note ° °Patient Details  °Name: Courtney Coleman °MRN: [id number] °Date of Birth: 02/27/1966 ° °Today's Date: 04/15/2020 °OT Individual Time: 1333-1428 °OT Individual Time Calculation (min): 55 min  ° ° °Short Term Goals: °Week 1:  OT Short Term Goal 1 (Week 1): Pt will recall functional place to put right UE during grooming tasks with min questioning cues °OT Short Term Goal 1 - Progress (Week 1): Met °OT Short Term Goal 2 (Week 1): Pt will perform sit to stands with contact guard with recall with min A for feet placement °OT Short Term Goal 2 - Progress (Week 1): Progressing toward goal °OT Short Term Goal 3 (Week 1): Pt will don shirt with min A with hemi dressing techniques °OT Short Term Goal 3 - Progress (Week 1): Met °OT Short Term Goal 4 (Week 1): Pt will thread underwear and pants with min A with extra time °OT Short Term Goal 4 - Progress (Week 1): Progressing toward goal ° °Skilled Therapeutic Interventions/Progress Updates:  °  1;1. Pt received in w/c with husband present agreeable to OT with no pain requesting to work on RUE. Pt completes tabletop activities seated and standing with CGA and MIN facilitation for end range shoulder motion. Pt with focus on tip to tip pinch, finger>palm manipulation, gross grasp and release and turning ojects in hand: scrabble tiles, pegs, clothes pins etc. Pt requires VC for R attention to R hand when reaching for objects often hitting hand into table. Exited session with pt seated in w/c, exit alarm on and call light in reach ° °Therapy Documentation °Precautions:  °Precautions °Precautions: Fall °Precaution Comments: R hemiparesis, orthostasis °Restrictions °Weight Bearing Restrictions: No °General: °  °Vital Signs: ° °Pain: °Pain Assessment °Pain Scale: 0-10 °Pain Score: 0-No pain °ADL: °ADL °Where Assessed-Eating: Wheelchair °Grooming: Minimal assistance °Where Assessed-Grooming: Sitting at sink °Upper Body Bathing: Moderate assistance °Where  Assessed-Upper Body Bathing: Shower °Lower Body Bathing: Maximal assistance °Where Assessed-Lower Body Bathing: Shower, Sitting at sink °Upper Body Dressing: Maximal assistance °Where Assessed-Upper Body Dressing: Sitting at sink °Lower Body Dressing: Maximal assistance °Toileting: Maximal assistance °Where Assessed-Toileting: Toilet °Toilet Transfer: Maximal assistance, Moderate assistance °Toilet Transfer Method: Stand pivot °Toilet Transfer Equipment: Grab bars °Walk-In Shower Transfer: Maximal assistance °Walk-In Shower Transfer Method: Stand pivot °Walk-In Shower Equipment: Transfer tub bench °Vision °  °Perception  °  °Praxis °  °Exercises: °  °Other Treatments:   ° ° °Therapy/Group: Individual Therapy ° °Stephanie M Schlosser °04/15/2020, 12:08 PM °

## 2020-04-16 ENCOUNTER — Inpatient Hospital Stay (HOSPITAL_COMMUNITY): Payer: No Typology Code available for payment source | Admitting: Occupational Therapy

## 2020-04-16 ENCOUNTER — Inpatient Hospital Stay (HOSPITAL_COMMUNITY): Payer: No Typology Code available for payment source | Admitting: Speech Pathology

## 2020-04-16 ENCOUNTER — Inpatient Hospital Stay (HOSPITAL_COMMUNITY): Payer: No Typology Code available for payment source

## 2020-04-16 ENCOUNTER — Ambulatory Visit: Payer: No Typology Code available for payment source | Admitting: Hematology and Oncology

## 2020-04-16 LAB — GLUCOSE, CAPILLARY
Glucose-Capillary: 102 mg/dL — ABNORMAL HIGH (ref 70–99)
Glucose-Capillary: 164 mg/dL — ABNORMAL HIGH (ref 70–99)
Glucose-Capillary: 148 mg/dL — ABNORMAL HIGH (ref 70–99)
Glucose-Capillary: 126 mg/dL — ABNORMAL HIGH (ref 70–99)

## 2020-04-16 MED ORDER — AMLODIPINE BESYLATE 5 MG PO TABS
7.5000 mg | ORAL_TABLET | Freq: Every day | ORAL | Status: DC
  Administered 2020-04-17 – 2020-04-22 (×6): 7.5 mg via ORAL
  Filled 2020-04-16 (×6): qty 1

## 2020-04-16 MED ORDER — AMLODIPINE BESYLATE 2.5 MG PO TABS
2.5000 mg | ORAL_TABLET | Freq: Once | ORAL | Status: AC
  Administered 2020-04-16: 2.5 mg via ORAL
  Filled 2020-04-16: qty 1

## 2020-04-16 NOTE — Progress Notes (Signed)
Speech Language Pathology Daily Session Note  Patient Details  Name: Maybree Riling MRN: [id number] Date of Birth: 24-May-1966  Today's Date: 04/16/2020 SLP Individual Time: 1015-1100 SLP Individual Time Calculation (min): 45 min  Short Term Goals: Week 3: SLP Short Term Goal 1 (Week 3): STG=LTG due to short ELOS  Skilled Therapeutic Interventions:   Patient seen for skilled ST session with focus on expressive and receptive language goals. Significant other present in room during session and SLP provided feedback and strategies, exercises they could work on outside of therapy sessions. Patient was more talkative than she has been. She was able to describe definitions of words but for majority she required modA to use context or dictionary on phone. She continues to struggle with figurative language but was able to improve with her ability to perform when SLP gave her more detailed examples/hypothetical situations. Patient continues to benefit from skilled SLP intervention to maximize language function prior to discharge.  Pain Pain Assessment Pain Scale: 0-10 Pain Score: 0-No pain Faces Pain Scale: No hurt Pain Type: Acute pain Patients Stated Pain Goal: 2  Therapy/Group: Individual Therapy  Sonia Baller, MA, CCC-SLP Speech Therapy [facility]

## 2020-04-16 NOTE — Progress Notes (Signed)
[place] PHYSICAL MEDICINE & REHABILITATION PROGRESS NOTE  Subjective/Complaints: Patient seen sitting up, working with therapy this morning.  She states she slept well overnight.  She is question regarding discharge date.  ROS: Denies CP, SOB, N/V/D  Objective: Vital Signs: Blood pressure (!) 156/89, pulse (!) 57, temperature 98.3 F (36.8 C), temperature source Oral, resp. rate 18, height 5\' 6"  (1.676 m), weight 104.4 kg, SpO2 100 %. No results found. No results for input(s): WBC, HGB, HCT, PLT in the last 72 hours. No results for input(s): NA, K, CL, CO2, GLUCOSE, BUN, CREATININE, CALCIUM in the last 72 hours.  Intake/Output Summary (Last 24 hours) at 04/16/2020 1436 Last data filed at 04/16/2020 0900 Gross per 24 hour  Intake 440 ml  Output 102 ml  Net 338 ml     Physical Exam: BP (!) 156/89 (BP Location: Right Arm)   Pulse (!) 57   Temp 98.3 F (36.8 C) (Oral)   Resp 18   Ht 5\' 6"  (1.676 m)   Wt 104.4 kg   SpO2 100%   BMI 37.15 kg/m   Constitutional: No distress . Vital signs reviewed. HENT: Normocephalic.  Atraumatic. Eyes: EOMI. No discharge. Cardiovascular: No JVD.  RRR. Respiratory: Normal effort.  No stridor.  Bilateral clear to auscultation. GI: Non-distended.  BS +. Skin: Warm and dry.  Intact. Psych: Normal mood.  Normal behavior. Musc: No edema in extremities.  No tenderness in extremities. Neuro: Alert Mild right facial weakness, unchanged Motor: LUE/LLE: 5/5 proximal distal RUE: 4+/5 proximal distal with apraxia, unchanged RLE: 4+/5 proximal distal, unchanged No increase in tone appreciated  Assessment/Plan: 1. 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  Physiatrist is providing close team supervision and  24 hour management of active medical problems listed below.  Physiatrist and rehab team continue to assess barriers to discharge/monitor patient progress toward functional and medical goals   Care Tool:  Bathing    Body parts bathed by patient: Right arm, Left arm, Chest, Abdomen, Front perineal area, Buttocks, Right upper leg, Left upper leg, Face, Left lower leg   Body parts bathed by helper: Right lower leg     Bathing assist Assist Level: Contact Guard/Touching assist     Upper Body Dressing/Undressing Upper body dressing   What is the patient wearing?: Pull over shirt    Upper body assist Assist Level: Set up assist    Lower Body Dressing/Undressing Lower body dressing      What is the patient wearing?: Pants, Incontinence brief     Lower body assist Assist for lower body dressing: Contact Guard/Touching assist     Toileting Toileting    Toileting assist Assist for toileting: Set up assist     Transfers Chair/bed transfer  Transfers assist     Chair/bed transfer assist level: Minimal Assistance - Patient > 75% Chair/bed transfer assistive device: Programmer, multimedia   Ambulation assist      Assist level: 2 helpers (+2 min A) Assistive device: Hand held assist Max distance: 154ft   Walk 10 feet activity   Assist     Assist level: 2 helpers (+2 min A) Assistive device: Hand held assist   Walk 50 feet activity   Assist Walk 50 feet with 2 turns activity did not occur: Safety/medical  concerns  Assist level: 2 helpers (+2 min A) Assistive device: Hand held assist    Walk 150 feet activity   Assist Walk 150 feet activity did not occur: Safety/medical concerns  Assist level: Minimal Assistance - Patient > 75% Assistive device: Walker-rolling    Walk 10 feet on uneven surface  activity   Assist Walk 10 feet on uneven surfaces activity did not occur: Safety/medical concerns         Wheelchair     Assist Will  patient use wheelchair at discharge?: No             Wheelchair 50 feet with 2 turns activity    Assist            Wheelchair 150 feet activity     Assist           Medical Problem List and Plan: 1.  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    Continue CIR  Lipid profile within normal limits on 11/9 2.  Antithrombotics: -DVT/anticoagulation:  Pharmaceutical: Other (comment)--now on Eliquis.              -antiplatelet therapy: N/A 3. Pain Management: Tylenol prn.  4. Mood: LCSW to follow for evaluation and support.              -antipsychotic agents: N/A 5. Neuropsych: This patient is capable of making decisions on her own behalf. 6. Skin/Wound Care: Routine pressure relief measures.  7. Fluids/Electrolytes/Nutrition: Monitor I/Os.   8. HTN: Monitor BP. Avoid LBP due to L-PCA stenosis  Appreciate cardiology recs  Norvasc 2.5 started on 11/11, increased to 5 on 11/14, increased to 7.5 on 11/18             Monitor with increased mobility 9. A fib s/p cardioversion: Monitor HR.             Monitor with increased exertion  Heart rate relatively controlled on 11/18  Appreciate cardiology recs 10. T2DM with hyperglycemia: Hgb A1c- 7.2. Monitor BS ac/hs. Diabetic education  Remains labile on 11/18  Continue to monitor 11. Breast cancer: Arimidex--diagnosed in August but not had follow up due to multiple admissions secondary to cardiac issues. Family has been requesting referral to Hem/onc/surgeon in town-appointment on 11/30 12. ABLA  Hb 10.9 on 11/15  Cont to monitor 13. Slow transit constipation  Bowel meds increased on 11/10  Improving  LOS: 15 days A FACE TO FACE EVALUATION WAS PERFORMED  Courtney Coleman Courtney Coleman 04/16/2020, 2:36 PM

## 2020-04-16 NOTE — Progress Notes (Signed)
Physical Therapy Session Note  Patient Details  Name: Courtney Coleman MRN: [id number] Date of Birth: 1965/06/28  Today's Date: 04/16/2020 PT Individual Time: 0800-0900 PT Individual Time Calculation (min): 60 min   Short Term Goals: Week 2:  PT Short Term Goal 1 (Week 2): Pt will completed bed mobility with supervision PT Short Term Goal 2 (Week 2): Pt will complete bed<>chair transfers with CGA and LRAD PT Short Term Goal 3 (Week 2): Pt will ambulate 1106ft with minA and LRAD PT Short Term Goal 4 (Week 2): Pt will negotiate up/down x4 steps with minA and LRAD  Skilled Therapeutic Interventions/Progress Updates:    Pt received sitting in w/c, agreeable to PT session, no reports of pain. W/c transport for time management to main therapy gym. Performed squat<>pivot transfer with close supervision from w/c to mat table, towards her L side. Performed repeated sit<>stands with close supervision from lowered mat table, focusing on controlled lowering and RLE knee stability. She ambulated 162ft with minA and RW, continues to show R foot drag and R knee instability with difficulty maintaining RW straight path. Applied Large Toe-up brace to RLE to improve ankle DF assist where she ambulated another 160ft with minA and RW. Improved R foot clearance however continues to show R knee instability in stance. Performed NMR for standing balance on blue air-ex foam pad. With unsupported standing and minA guard, she performed feet together with eyes open and eyes closed. She also trialed mini single leg squats on RLE on airex but had severe knee buckling required maxA for recovery. Supine<>sit with minA for RLE management onto mat table. Performed the following there-ex on mat table to target both R hip and R knee strengthening: - repeated bridges with RLE bias progressions, performed a total of 4x10  -R sidelying straight leg hip abduction (AAROM) 2x10  -R sidelying straight leg hip isometric holds (AAROM) 2x10 -R  sidelying clamshells 2x10  Supine<>sit on mat table with CGA. Ambulated another 2x140ft with minA and RW with toe-brace, focusing on R foot placement and general stability; she still has difficulty, even with R toe brace, clearing R foot. Pt was returned to her room in w/c and remained seated in chair with fiance at bedside, updated on mobility.   Therapy Documentation Precautions:  Precautions Precautions: Fall Precaution Comments: R hemiparesis, orthostasis Restrictions Weight Bearing Restrictions: No  Therapy/Group: Individual Therapy  Andy Allende P Silvana Holecek PT 04/16/2020, 7:40 AM

## 2020-04-16 NOTE — Progress Notes (Signed)
Occupational Therapy Session Note  Patient Details  Name: Courtney Coleman MRN: [id number] Date of Birth: 1965/10/04  Today's Date: 04/16/2020 OT Individual Time: 1115-1200 OT Individual Time Calculation (min): 45 min    Short Term Goals: Week 2:  OT Short Term Goal 1 (Week 2): Pt will complete LB dressing with min assist. OT Short Term Goal 2 (Week 2): Pt will complete sit<>stand with CGA and safe descent in prep for LB self care. OT Short Term Goal 3 (Week 2): Pt will brush teeth using right dominant hand with supervision.  Skilled Therapeutic Interventions/Progress Updates:    Patient seated in w/c, alert and ready for therapy session.  She denies pain or need for adl tasks at this time.  SPT to /from mat surface with min A.  Good seated balance t/o session.  Completed trunk mobility/balance, core strength, posture and proximal stability activities, GMC, inhibition and stretching of flexors shoulder to hand, facilitation of extensors shoulder to hand with increased distal control and outward reach noted at end of session.  Completed dexterity and weight bearing in seated and standing positions with good tolerance.   She remained seated in w.c at close of session, husband present and call bell in reach.    Therapy Documentation Precautions:  Precautions Precautions: Fall Precaution Comments: R hemiparesis, orthostasis Restrictions Weight Bearing Restrictions: No  Therapy/Group: Individual Therapy  Carlos Levering 04/16/2020, 7:40 AM

## 2020-04-16 NOTE — Progress Notes (Signed)
Occupational Therapy Session Note  Patient Details  Name: Courtney Coleman MRN: [id number] Date of Birth: June 22, 1965  Today's Date: 04/16/2020 OT Individual Time: 1330-1430 OT Individual Time Calculation (min): 60 min    Short Term Goals: Week 1:  OT Short Term Goal 1 (Week 1): Pt will recall functional place to put right UE during grooming tasks with min questioning cues OT Short Term Goal 1 - Progress (Week 1): Met OT Short Term Goal 2 (Week 1): Pt will perform sit to stands with contact guard with recall with min A for feet placement OT Short Term Goal 2 - Progress (Week 1): Progressing toward goal OT Short Term Goal 3 (Week 1): Pt will don shirt with min A with hemi dressing techniques OT Short Term Goal 3 - Progress (Week 1): Met OT Short Term Goal 4 (Week 1): Pt will thread underwear and pants with min A with extra time OT Short Term Goal 4 - Progress (Week 1): Progressing toward goal Week 2:  OT Short Term Goal 1 (Week 2): Pt will complete LB dressing with min assist. OT Short Term Goal 2 (Week 2): Pt will complete sit<>stand with CGA and safe descent in prep for LB self care. OT Short Term Goal 3 (Week 2): Pt will brush teeth using right dominant hand with supervision.  Skilled Therapeutic Interventions/Progress Updates:    Pt sitting up in w/c, no c/o pain.  Pt completed sinkside self care including bathing and dressing with focus on increased use of right hand functionally.  Pt required supervision overall for UB bathing and dressing with min intermittent VCs to encourage right handed use.  Pt completed LB bathing and dressing with CGA during sit<>stand and standing portion of tasks and supervision for seated portion.  Pt needing increased time to complete due to impaired fine motor control of right hand.  Pt requesting to use bathroom.  CGA at RW to ambulate from sinkside to bathroom and complete 3 in 1 commode transfer.  Pt completed pericare with supervision and CGA for clothing  mgt.  Pt ambulated back to w/c at bedside with CGA needing min VCs to prevent rapid descent during stand to sit.  Pt participated in box and blocks assessment with the following results: right hand: 19 blocks; left hand: 52 blocks.  Call bell in reach, seat belt alarm on.  Therapy Documentation Precautions:  Precautions Precautions: Fall Precaution Comments: R hemiparesis, orthostasis Restrictions Weight Bearing Restrictions: No   Therapy/Group: Individual Therapy  Ezekiel Slocumb 04/16/2020, 2:03 PM

## 2020-04-17 ENCOUNTER — Inpatient Hospital Stay (HOSPITAL_COMMUNITY): Payer: No Typology Code available for payment source | Admitting: Speech Pathology

## 2020-04-17 ENCOUNTER — Inpatient Hospital Stay (HOSPITAL_COMMUNITY): Payer: No Typology Code available for payment source

## 2020-04-17 ENCOUNTER — Inpatient Hospital Stay (HOSPITAL_COMMUNITY): Payer: No Typology Code available for payment source | Admitting: Occupational Therapy

## 2020-04-17 DIAGNOSIS — R208 Other disturbances of skin sensation: Secondary | ICD-10-CM

## 2020-04-17 LAB — GLUCOSE, CAPILLARY
Glucose-Capillary: 101 mg/dL — ABNORMAL HIGH (ref 70–99)
Glucose-Capillary: 99 mg/dL (ref 70–99)
Glucose-Capillary: 143 mg/dL — ABNORMAL HIGH (ref 70–99)
Glucose-Capillary: 141 mg/dL — ABNORMAL HIGH (ref 70–99)

## 2020-04-17 NOTE — Progress Notes (Signed)
Orthopedic Tech Progress Note Patient Details:  Courtney Coleman May 12, 1966 [id number] Called in order to [employer] for a [facility] Patient ID: Courtney Coleman, female   DOB: 1966/03/13, 54 y.o.   MRN: [id number]   Janit Pagan 04/17/2020, 2:07 PM

## 2020-04-17 NOTE — Progress Notes (Signed)
[place] PHYSICAL MEDICINE & REHABILITATION PROGRESS NOTE  Subjective/Complaints: Patient seen sitting up in her chair this morning.  She states she slept well overnight.  She notes tingling in her fingers x2 overnight.  Discussed options, however patient does not want any medication at this time.  Denies at present.  Her fianc notes that she is getting stronger.  ROS: Denies CP, SOB, N/V/D  Objective: Vital Signs: Blood pressure (!) 178/101, pulse 81, temperature 98.5 F (36.9 C), temperature source Oral, resp. rate 18, height 5\' 6"  (1.676 m), weight 104.5 kg, SpO2 99 %. No results found. No results for input(s): WBC, HGB, HCT, PLT in the last 72 hours. No results for input(s): NA, K, CL, CO2, GLUCOSE, BUN, CREATININE, CALCIUM in the last 72 hours.  Intake/Output Summary (Last 24 hours) at 04/17/2020 1236 Last data filed at 04/17/2020 0115 Gross per 24 hour  Intake 220 ml  Output 300 ml  Net -80 ml     Physical Exam: BP (!) 178/101 (BP Location: Left Arm)   Pulse 81   Temp 98.5 F (36.9 C) (Oral)   Resp 18   Ht 5\' 6"  (1.676 m)   Wt 104.5 kg   SpO2 99%   BMI 37.18 kg/m   Constitutional: No distress . Vital signs reviewed. HENT: Normocephalic.  Atraumatic. Eyes: EOMI. No discharge. Cardiovascular: No JVD.  RRR. Respiratory: Normal effort.  No stridor.  Bilateral clear to auscultation. GI: Non-distended.  BS +. Skin: Warm and dry.  Intact. Psych: Normal mood.  Normal behavior. Musc: No edema in extremities.  No tenderness in extremities. Negative Tinel's, Durkin's at right median wrist Neuro: Alert Mild right facial weakness, unchanged Motor: LUE/LLE: 5/5 proximal distal RUE: 4+/5 proximal distal with apraxia, stable RLE: 4+/5 proximal distal, stable No increase in tone appreciated  Assessment/Plan: 1. 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  Physiatrist is providing close team supervision and 24 hour management of active medical problems listed below.  Physiatrist and rehab team continue to assess barriers to discharge/monitor patient progress toward functional and medical goals   Care Tool:  Bathing    Body parts bathed by patient: Right arm, Left arm, Chest, Abdomen, Front perineal area, Buttocks, Right upper leg, Left upper leg, Face, Left lower leg   Body parts bathed by helper: Right lower leg     Bathing assist Assist Level: Contact Guard/Touching assist     Upper Body Dressing/Undressing Upper body dressing   What is the patient wearing?: Pull over shirt    Upper body assist Assist Level: Set up assist    Lower Body Dressing/Undressing Lower body dressing      What is the patient wearing?: Pants, Incontinence brief     Lower body assist Assist for lower body dressing: Contact Guard/Touching assist     Toileting Toileting    Toileting assist Assist for toileting: Set up assist     Transfers Chair/bed transfer  Transfers assist     Chair/bed transfer assist level: Minimal Assistance - Patient > 75% Chair/bed transfer assistive device: Programmer, multimedia   Ambulation assist      Assist level: 2 helpers (+2 min A) Assistive device: Hand held assist Max distance: 187ft   Walk 10 feet activity   Assist  Assist level: 2 helpers (+2 min A) Assistive device: Hand held assist   Walk 50 feet activity   Assist Walk 50 feet with 2 turns activity did not occur: Safety/medical concerns  Assist level: 2 helpers (+2 min A) Assistive device: Hand held assist    Walk 150 feet activity   Assist Walk 150 feet activity did not occur: Safety/medical concerns  Assist level: Minimal Assistance - Patient > 75% Assistive device: Walker-rolling     Walk 10 feet on uneven surface  activity   Assist Walk 10 feet on uneven surfaces activity did not occur: Safety/medical concerns         Wheelchair     Assist Will patient use wheelchair at discharge?: No             Wheelchair 50 feet with 2 turns activity    Assist            Wheelchair 150 feet activity     Assist           Medical Problem List and Plan: 1.  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    Continue CIR  WHO ordered for dysesthesias  Lipid profile within normal limits on 11/9 2.  Antithrombotics: -DVT/anticoagulation:  Pharmaceutical: Other (comment)--now on Eliquis.              -antiplatelet therapy: N/A 3. Pain Management: Tylenol prn.  4. Mood: LCSW to follow for evaluation and support.              -antipsychotic agents: N/A 5. Neuropsych: This patient is capable of making decisions on her own behalf. 6. Skin/Wound Care: Routine pressure relief measures.  7. Fluids/Electrolytes/Nutrition: Monitor I/Os.   8. HTN: Monitor BP. Avoid LBP due to L-PCA stenosis  Appreciate cardiology recs  Norvasc 2.5 started on 11/11, increased to 5 on 11/14, increased to 7.5 on 11/18  Remains elevated on 11/19, would not make any changes today given increase yesterday.             Monitor with increased mobility 9. A fib s/p cardioversion: Monitor HR.             Monitor with increased exertion  Heart rate relatively controlled on 11/19  Appreciate cardiology recs 10. T2DM with hyperglycemia: Hgb A1c- 7.2. Monitor BS ac/hs. Diabetic education  Labile on 11/19, monitor for trend  Continue to monitor 11. Breast cancer: Arimidex--diagnosed in August but not had follow up due to multiple admissions secondary to cardiac issues. Family has been  requesting referral to Hem/onc/surgeon in town-appointment on 11/30 12. ABLA  Hb 10.9 on 11/15  Cont to monitor 13. Slow transit constipation  Bowel meds increased on 11/10  Improving  LOS: 16 days A FACE TO FACE EVALUATION WAS PERFORMED  Glena Pharris Lorie Phenix 04/17/2020, 12:36 PM

## 2020-04-17 NOTE — Progress Notes (Signed)
Speech Language Pathology Daily Session Note  Patient Details  Name: Courtney Coleman MRN: [id number] Date of Birth: 02/13/1966  Today's Date: 04/17/2020 SLP Individual Time: 1000-1100 SLP Individual Time Calculation (min): 60 min  Short Term Goals: Week 3: SLP Short Term Goal 1 (Week 3): STG=LTG due to short ELOS  Skilled Therapeutic Interventions:   Patient seen to address expressive and receptive language goals. She was able to correctly describe 3 different proverbs after SLP rephrasing and reviewing key words. She was 75% accurate with minimal cues for answering multiple choice comprehension questions after clinician-read and independent reading of short passages. She summarized main points/information after she read a paragraph, followed by SLP re-reading it to her and required minA cues to perform. Patient continues to benefit from skilled SLP intervention to maximize language function prior to discharge.   Pain Pain Assessment Pain Scale: 0-10 Pain Score: 0-No pain  Therapy/Group: Individual Therapy   Sonia Baller, MA, CCC-SLP Speech Therapy

## 2020-04-17 NOTE — Progress Notes (Signed)
Occupational Therapy Weekly Progress Note  Patient Details  Name: Courtney Coleman MRN: [id number] Date of Birth: 10/06/1965  Beginning of progress report period: April 10, 2020 End of progress report period: April 17, 2020  Today's Date: 04/17/2020 OT Individual Time: 1416-1530 OT Individual Time Calculation (min): 74 min    Patient has met 2 of 3 short term goals.  Pt is steadily improving evidenced by increased independence during dressing, oral hygiene, bathing, and toileting tasks.  Pt improved from needing mod assist for LB dressing to now only needing CGA and from needing min/mod assist for bathing to now only needing CGA/supervision, as well as pt is now using right hand more for functional tasks including brushing teeth with supervision.  Pt does still need extra time to complete tasks with right hand incorporation due to impaired fine motor control and coordination as well as impaired proprioception, however it is noticeably improving. Pt has difficulty with in-hand manipulation, digital isolation, with delayed fine motor processing.  Pts functional transfers and standing balance are improving as well needing CGA/superivision, but does still occasionally need min assist due to impaired mild safety awareness impairment with rapid descent upon sitting. Pt is very motivated to get better overall and family is supportive throughout sessions.  Patient continues to demonstrate the following deficits: muscle weakness, decreased coordination and decreased motor planning, right side neglect, decreased awareness, decreased problem solving, decreased safety awareness and delayed processing and decreased standing balance and decreased balance strategies and therefore will continue to benefit from skilled OT intervention to enhance overall performance with BADL.  Patient progressing toward long term goals..  Continue plan of care.  OT Short Term Goals Week 2:  OT Short Term Goal 1 (Week 2): Pt  will complete LB dressing with min assist. OT Short Term Goal 1 - Progress (Week 2): Met OT Short Term Goal 2 (Week 2): Pt will complete sit<>stand with CGA and safe descent in prep for LB self care. OT Short Term Goal 2 - Progress (Week 2): Progressing toward goal OT Short Term Goal 3 (Week 2): Pt will brush teeth using right dominant hand with supervision. OT Short Term Goal 3 - Progress (Week 2): Met Week 3:  OT Short Term Goal 1 (Week 3): STGs=LTGs due to ELOS  Skilled Therapeutic Interventions/Progress Updates:    Pt sitting up in w/c with partner present throughout session.  Pt reports her left eye feels irritated and swollen.  Nursing made aware.  Pt agreeable to session with OT.  Pt completed resistive therex RUE gross motor in preparation for fine motor tasks and for strengthening.  Pt completed 2 x 20 reps biceps curls using 3 lb free weight and forearm pro/supination using 1 lb free weight.  Pt required intermittent VCs, visual demo, and TCs to facilitate improved sequencing of positioning during biceps curls. Pt completed gross grip using min resistive putty with neuro re-ed component to facilitate in hand manipulation of putty.  Pt having difficulty isolating fingers to rotate putty in palm.  Pt instructed through finger extension taps on at a time and tendon glides 2 x 20 reps.  Pt completed fine motor coordination tasks including pvc pipe puzzle, screwing nuts/bolts, and finger<>palm in hand manipulation using small glass beads.  Pt required intermittent multimodal cue's to promote improved motor control.  Also educated pt on use of visual attending to right hand as much as possible due to impaired proprioception to prevent knocking items over.  Pt up in w/c, call bell in  reach, seat alarm on.    Therapy Documentation Precautions:  Precautions Precautions: Fall Precaution Comments: R hemiparesis, orthostasis Restrictions Weight Bearing Restrictions: No   Therapy/Group: Individual  Therapy  Ezekiel Slocumb 04/17/2020, 10:01 AM

## 2020-04-17 NOTE — Progress Notes (Signed)
Physical Therapy Weekly Progress Note  Patient Details  Name: Courtney Coleman MRN: [id number] Date of Birth: May 25, 1966  Beginning of progress report period: April 10, 2020 End of progress report period: April 17, 2020  Today's Date: 04/17/2020 PT Individual Time: 0800-0900 PT Individual Time Calculation (min): 60 min   Patient has met 3 of 4 short term goals. Pt is making appropriate progress. She continues to be highly motivated towards regaining indep. She has supportive spouse who has been present most days who assists with self care tasks, appears very involved and willing to help which is great. She is able to complete bed mobility with supervision, squat<>pivot transfers with CGA, and can ambulate 112f with minA and RW. She still requires modA for navigating 4 stairs due to RLE weakness and impaired RLE proprioception. Pt will not need to navigate stairs at discharge, will be staying on main level with 1 step to enter home.  Patient continues to demonstrate the following deficits muscle weakness, decreased cardiorespiratoy endurance, unbalanced muscle activation, decreased coordination and decreased motor planning, decreased attention to right and decreased motor planning and decreased standing balance, decreased postural control, hemiplegia and decreased balance strategies and therefore will continue to benefit from skilled PT intervention to increase functional independence with mobility.  Patient progressing toward long term goals.  Continue plan of care.  PT Short Term Goals Week 2:  PT Short Term Goal 1 (Week 2): Pt will completed bed mobility with supervision PT Short Term Goal 2 (Week 2): Pt will complete bed<>chair transfers with CGA and LRAD PT Short Term Goal 3 (Week 2): Pt will ambulate 1523fwith minA and LRAD PT Short Term Goal 4 (Week 2): Pt will negotiate up/down x4 steps with minA and LRAD Week 3:  PT Short Term Goal 1 (Week 3): STG = LTG due to ELOS  Skilled  Therapeutic Interventions/Progress Updates:    Pt received sitting in w/c, fiance at bedside, RN providing morning medications. Pt agreeable to PT session. Reports R distal finger tip nerve pain, described as "burning." RN made aware and MD notified as well. Pt reports this pain occurs primarily after "exercising" but the onset is delayed, primarily at night time. W/c transport for time management to main therapy gym. Squat<>pivot transfer with CGA from w/c to mat table. Performed repeated sit<>stands with CGA from mat table. Also performed forward stepping with LLE while focusing on RLE stabilization, requiring minA guard for balance and R knee stability (knee buckling vs hyperextension). She had a few instances of large LOB due to buckling requiring maxA for recovery. She also performed lateral stepping with LLE while again, focusing on RLE stabilization, requiring minA guard for balance and R knee stability. Completed multi-plane knee stability training by performing static standing marching with LLE with minA guard. After seated rest break, performed eccentric step downs with LLE (R knee stability) on 1inch block with modA for balance (x2 instances of maxA LOB 2/2 knee buckling requiring assist for recovery). Ambulated 2x1253fith CGAminA guard and RW (removed RW gutter splint to promote RUE grasp). Gait deficits include R intermittent toe drag, R lateral lean, narrow BOS. No overt LOB but general unsteadiness noted. Squat<>pivot with CGA from mat table back to her w/c and returned to her room where she remained seated in w.c with needs in reach, made comfortable.   Therapy Documentation Precautions:  Precautions Precautions: Fall Precaution Comments: R hemiparesis, orthostasis Restrictions Weight Bearing Restrictions: No  Therapy/Group: Individual Therapy  Jeoffrey Eleazer P Aveena Bari PT 04/17/2020,  7:44 AM

## 2020-04-18 LAB — GLUCOSE, CAPILLARY
Glucose-Capillary: 99 mg/dL (ref 70–99)
Glucose-Capillary: 163 mg/dL — ABNORMAL HIGH (ref 70–99)
Glucose-Capillary: 115 mg/dL — ABNORMAL HIGH (ref 70–99)
Glucose-Capillary: 193 mg/dL — ABNORMAL HIGH (ref 70–99)

## 2020-04-18 NOTE — Progress Notes (Signed)
[facility] PHYSICAL MEDICINE & REHABILITATION PROGRESS NOTE  Subjective/Complaints: .  Slept well , no hand tingling last noc or this am   ROS: Denies CP, SOB, N/V/D  Objective: Vital Signs: Blood pressure (!) 144/67, pulse (!) 59, temperature (!) 97.5 F (36.4 C), temperature source Oral, resp. rate 18, height 5\' 6"  (1.676 m), weight 104.7 kg, SpO2 96 %. No results found. No results for input(s): WBC, HGB, HCT, PLT in the last 72 hours. No results for input(s): NA, K, CL, CO2, GLUCOSE, BUN, CREATININE, CALCIUM in the last 72 hours.  Intake/Output Summary (Last 24 hours) at 04/18/2020 1107 Last data filed at 04/18/2020 0900 Gross per 24 hour  Intake 220 ml  Output 325 ml  Net -105 ml     Physical Exam: BP (!) 144/67 (BP Location: Left Arm)   Pulse (!) 59   Temp (!) 97.5 F (36.4 C) (Oral)   Resp 18   Ht 5\' 6"  (1.676 m)   Wt 104.7 kg   SpO2 96%   BMI 37.26 kg/m    General: No acute distress Mood and affect are appropriate Heart: Regular rate and rhythm no rubs murmurs or extra sounds Lungs: Clear to auscultation, breathing unlabored, no rales or wheezes Abdomen: Positive bowel sounds, soft nontender to palpation, nondistended Extremities: No clubbing, cyanosis, or edema Skin: No evidence of breakdown, no evidence of rash   Neuro: Alert Mild right facial weakness, unchanged Motor: LUE/LLE: 5/5 proximal distal RUE: 4+/5 proximal distal with apraxia, stable RLE: 4+/5 proximal distal, stable Decreased fine motor Right hand No increase in tone appreciated  Assessment/Plan: 1. 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  Physiatrist is providing close team supervision and 24 hour management of active medical problems listed  below.  Physiatrist and rehab team continue to assess barriers to discharge/monitor patient progress toward functional and medical goals   Care Tool:  Bathing    Body parts bathed by patient: Right arm, Left arm, Chest, Abdomen, Front perineal area, Buttocks, Right upper leg, Left upper leg, Face, Left lower leg   Body parts bathed by helper: Right lower leg     Bathing assist Assist Level: Contact Guard/Touching assist     Upper Body Dressing/Undressing Upper body dressing   What is the patient wearing?: Pull over shirt    Upper body assist Assist Level: Set up assist    Lower Body Dressing/Undressing Lower body dressing      What is the patient wearing?: Pants, Incontinence brief     Lower body assist Assist for lower body dressing: Contact Guard/Touching assist     Toileting Toileting    Toileting assist Assist for toileting: Set up assist     Transfers Chair/bed transfer  Transfers assist     Chair/bed transfer assist level: Minimal Assistance - Patient > 75% Chair/bed transfer assistive device: Programmer, multimedia   Ambulation assist      Assist level: 2 helpers (+2 min A) Assistive device: Hand held assist Max distance: 161ft   Walk 10 feet activity   Assist     Assist level: 2 helpers (+2 min A) Assistive device: Hand held assist   Walk 50 feet activity   Assist Walk 50 feet with 2 turns activity did not occur: Safety/medical concerns  Assist  level: 2 helpers (+2 min A) Assistive device: Hand held assist    Walk 150 feet activity   Assist Walk 150 feet activity did not occur: Safety/medical concerns  Assist level: Minimal Assistance - Patient > 75% Assistive device: Walker-rolling    Walk 10 feet on uneven surface  activity   Assist Walk 10 feet on uneven surfaces activity did not occur: Safety/medical concerns         Wheelchair     Assist Will patient use wheelchair at discharge?: No              Wheelchair 50 feet with 2 turns activity    Assist            Wheelchair 150 feet activity     Assist           Medical Problem List and Plan: 1.  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    Continue CIR  WHO ordered for dysesthesias- CVA related   Lipid profile within normal limits on 11/9 2.  Antithrombotics: -DVT/anticoagulation:  Pharmaceutical: Other (comment)--now on Eliquis.              -antiplatelet therapy: N/A 3. Pain Management: Tylenol prn.  4. Mood: LCSW to follow for evaluation and support.              -antipsychotic agents: N/A 5. Neuropsych: This patient is capable of making decisions on her own behalf. 6. Skin/Wound Care: Routine pressure relief measures.  7. Fluids/Electrolytes/Nutrition: Monitor I/Os.   8. HTN: Monitor BP. Avoid LBP due to L-PCA stenosis  Appreciate cardiology recs  Norvasc 2.5 started on 11/11, increased to 5 on 11/14, increased to 7.5 on 11/18  Remains elevated on 11/19, would not make any changes today given increase yesterday.          Vitals:   04/17/20 1934 04/18/20 0447  BP: (!) 160/68 (!) 144/67  Pulse: 68 (!) 59  Resp: 18 18  Temp: (!) 97.4 F (36.3 C) (!) 97.5 F (36.4 C)  SpO2: 99% 96%  improved this am  9. A fib s/p cardioversion: Monitor HR.             Monitor with increased exertion  Heart rate low to normal   Appreciate cardiology recs 10. T2DM with hyperglycemia: Hgb A1c- 7.2. Monitor BS ac/hs. Diabetic education  Controlled 11/20   CBG (last 3)  Recent Labs    04/17/20 1631 04/17/20 2130 04/18/20 0635  GLUCAP 143* 141* 99    11. Breast cancer: Arimidex--diagnosed in August but not had follow up due to multiple admissions secondary to cardiac issues. Family has been requesting referral to  Hem/onc/surgeon in town-appointment on 11/30 12. ABLA  Hb 10.9 on 11/15  Cont to monitor 13. Slow transit constipation  Bowel meds increased on 11/10  Improving  LOS: 17 days A FACE TO FACE EVALUATION WAS PERFORMED  Charlett Blake 04/18/2020, 11:07 AM

## 2020-04-19 ENCOUNTER — Encounter (HOSPITAL_COMMUNITY): Payer: No Typology Code available for payment source | Admitting: Occupational Therapy

## 2020-04-19 ENCOUNTER — Inpatient Hospital Stay (HOSPITAL_COMMUNITY): Payer: No Typology Code available for payment source

## 2020-04-19 LAB — GLUCOSE, CAPILLARY
Glucose-Capillary: 100 mg/dL — ABNORMAL HIGH (ref 70–99)
Glucose-Capillary: 97 mg/dL (ref 70–99)
Glucose-Capillary: 154 mg/dL — ABNORMAL HIGH (ref 70–99)
Glucose-Capillary: 189 mg/dL — ABNORMAL HIGH (ref 70–99)

## 2020-04-19 NOTE — Plan of Care (Signed)
  Problem: RH BOWEL ELIMINATION Goal: RH STG MANAGE BOWEL WITH ASSISTANCE Description: STG Manage Bowel with min Assistance. Outcome: Progressing   Problem: RH SKIN INTEGRITY Goal: RH STG SKIN FREE OF INFECTION/BREAKDOWN Description: With min assist Outcome: Progressing Goal: RH STG MAINTAIN SKIN INTEGRITY WITH ASSISTANCE Description: STG Maintain Skin Integrity With min Assistance. Outcome: Progressing

## 2020-04-19 NOTE — Progress Notes (Signed)
Speech Language Pathology Daily Session Note  Patient Details  Name: Courtney Coleman MRN: [id number] Date of Birth: 09/25/1965  Today's Date: 04/19/2020 SLP Individual Time: 3428-7681 SLP Individual Time Calculation (min): 42 min  Short Term Goals: Week 3: SLP Short Term Goal 1 (Week 3): STG=LTG due to short ELOS  Skilled Therapeutic Interventions:Skilled ST services focused on language skills. SLP facilitated identifying main idea in complex to mildly complex paragraphs, pt required mod A verbal cues in complex verse supervision A verbal cues in mildly complex content. Pt demonstrated ability to apply strategies to identify main idea utilizing [facility] questions with supervision A verbal cues. Pt was able to name synonyms and antynonyms with min A semantic and context cues. Pt was left in room with call bell within reach and chair alarm set. SLP recommends to continue skilled services.     Pain Pain Assessment Pain Score: 0-No pain  Therapy/Group: Individual Therapy  Courtney Coleman  [facility] 04/19/2020, 11:50 AM

## 2020-04-19 NOTE — Progress Notes (Signed)
Occupational Therapy Session Note  Patient Details  Name: Courtney Coleman MRN: [id number] Date of Birth: 08/17/1965  Today's Date: 04/19/2020 OT Group Time: 1100-1200 OT Group Time Calculation (min): 60 min  Skilled Therapeutic Interventions/Progress Updates:    Pt engaged in therapeutic w/c level dance group focusing on patient choice, UE/LE strengthening, salience, activity tolerance, and social participation. Pt was guided through various dance-based exercises involving UEs/LEs and trunk. All music was selected by group members. Emphasis placed on Rt NMR and standing balance. Pt and her spouse [name] exhibited very high levels of participation in group. Pt actively worked on strengthening her Rt side, spouse providing min cuing at times. She stood with spouse using her RW to dance to one song, OT providing CGA-Min A for dynamic balance while she marched in place. At end of session pts spouse returned pt to the room via w/c.    Therapy Documentation Precautions:  Precautions Precautions: Fall Precaution Comments: R hemiparesis, orthostasis Restrictions Weight Bearing Restrictions: No Vital Signs: Therapy Vitals Temp: 98.3 F (36.8 C) Pulse Rate: 60 Resp: 18 BP: (!) 144/68 Patient Position (if appropriate): Lying Oxygen Therapy SpO2: 100 % O2 Device: Room Air Pain: pt with no s/s pain during tx   ADL: ADL Where Assessed-Eating: Wheelchair Grooming: Minimal assistance Where Assessed-Grooming: Sitting at sink Upper Body Bathing: Moderate assistance Where Assessed-Upper Body Bathing: Shower Lower Body Bathing: Maximal assistance Where Assessed-Lower Body Bathing: Shower, Sitting at sink Upper Body Dressing: Maximal assistance Where Assessed-Upper Body Dressing: Sitting at sink Lower Body Dressing: Maximal assistance Toileting: Maximal assistance Where Assessed-Toileting: Glass blower/designer: Maximal assistance, Moderate assistance Toilet Transfer Method: Actuary: Energy manager: Doctor, general practice Method: Radiographer, therapeutic: Transfer tub bench      Therapy/Group: Group Therapy  [employer] 04/19/2020, 4:01 PM

## 2020-04-20 ENCOUNTER — Inpatient Hospital Stay (HOSPITAL_COMMUNITY): Payer: No Typology Code available for payment source

## 2020-04-20 ENCOUNTER — Inpatient Hospital Stay (HOSPITAL_COMMUNITY): Payer: No Typology Code available for payment source | Admitting: Occupational Therapy

## 2020-04-20 LAB — CBC
HCT: 39.3 % (ref 36.0–46.0)
Hemoglobin: 12.1 g/dL (ref 12.0–15.0)
MCH: 25.7 pg — ABNORMAL LOW (ref 26.0–34.0)
MCHC: 30.8 g/dL (ref 30.0–36.0)
MCV: 83.6 fL (ref 80.0–100.0)
Platelets: 258 10*3/uL (ref 150–400)
RBC: 4.7 MIL/uL (ref 3.87–5.11)
RDW: 19.9 % — ABNORMAL HIGH (ref 11.5–15.5)
WBC: 5.7 10*3/uL (ref 4.0–10.5)
nRBC: 0 % (ref 0.0–0.2)

## 2020-04-20 LAB — BASIC METABOLIC PANEL
Anion gap: 8 (ref 5–15)
BUN: 13 mg/dL (ref 6–20)
CO2: 26 mmol/L (ref 22–32)
Calcium: 9.2 mg/dL (ref 8.9–10.3)
Chloride: 104 mmol/L (ref 98–111)
Creatinine, Ser: 0.74 mg/dL (ref 0.44–1.00)
GFR, Estimated: >60 mL/min (ref >60)
Glucose, Bld: 139 mg/dL — ABNORMAL HIGH (ref 70–99)
Potassium: 3.5 mmol/L (ref 3.5–5.1)
Sodium: 138 mmol/L (ref 135–145)

## 2020-04-20 LAB — GLUCOSE, CAPILLARY
Glucose-Capillary: 103 mg/dL — ABNORMAL HIGH (ref 70–99)
Glucose-Capillary: 101 mg/dL — ABNORMAL HIGH (ref 70–99)
Glucose-Capillary: 100 mg/dL — ABNORMAL HIGH (ref 70–99)
Glucose-Capillary: 192 mg/dL — ABNORMAL HIGH (ref 70–99)

## 2020-04-20 NOTE — Progress Notes (Signed)
Speech Language Pathology Daily Session Note  Patient Details  Name: Courtney Coleman MRN: [id number] Date of Birth: 1965-10-31  Today's Date: 04/20/2020 SLP Individual Time: 1005-1100 SLP Individual Time Calculation (min): 55 min  Short Term Goals: Week 3: SLP Short Term Goal 1 (Week 3): STG=LTG due to short ELOS  Skilled Therapeutic Interventions:Skilled ST services focused on education and language skills. SLP created packets to practice word finding skills in various structured and abstract tasks (inlcuding naming synonyms, antonyms, divergent /convergent naming, creating sentences based on function, inferencing and abstract topics...etc.) SLP facilitated use of word finding strategies in each task, pt required min A semantic cues. SLP provided education how to utilize packets, since pt will most likely not qualify for continued ST services. All questions answered to satisfaction. Pt was left in room with call bell within reach and chair alarm set. SLP recommends to continue skilled services.     Pain Pain Assessment Pain Score: 0-No pain  Therapy/Group: Individual Therapy  Sung Parodi  [facility] 04/20/2020, 4:16 PM

## 2020-04-20 NOTE — Progress Notes (Signed)
[place] PHYSICAL MEDICINE & REHABILITATION PROGRESS NOTE  Subjective/Complaints: No complaints with this morning.  Very grateful for care here Requests 2-3 month supply of medications given lack of ability to drive.   ROS: Denies CP, SOB, N/V/D  Objective: Vital Signs: Blood pressure (!) 145/74, pulse (!) 59, temperature 97.8 F (36.6 C), resp. rate 16, height 5\' 6"  (1.676 m), weight 106.1 kg, SpO2 98 %. No results found. Recent Labs    04/20/20 0743  WBC 5.7  HGB 12.1  HCT 39.3  PLT 258   Recent Labs    04/20/20 0743  NA 138  K 3.5  CL 104  CO2 26  GLUCOSE 139*  BUN 13  CREATININE 0.74  CALCIUM 9.2    Intake/Output Summary (Last 24 hours) at 04/20/2020 1200 Last data filed at 04/20/2020 1049 Gross per 24 hour  Intake 720 ml  Output 200 ml  Net 520 ml     Physical Exam: BP (!) 145/74 (BP Location: Left Arm)   Pulse (!) 59   Temp 97.8 F (36.6 C)   Resp 16   Ht 5\' 6"  (1.676 m)   Wt 106.1 kg   SpO2 98%   BMI 37.75 kg/m   Gen: no distress, normal appearing HEENT: oral mucosa pink and moist, NCAT Cardio: Reg rate Chest: normal effort, normal rate of breathing Abd: soft, non-distended Ext: no edema Skin: intact Psych: pleasant, normal affect Neuro: Alert Mild right facial weakness, unchanged Motor: LUE/LLE: 5/5 proximal distal RUE: 4+/5 proximal distal with apraxia, stable RLE: 4+/5 proximal distal, stable Decreased fine motor Right hand No increase in tone appreciated  Assessment/Plan: 1. 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  Physiatrist is providing close team supervision and 24 hour management of active medical problems listed below.  Physiatrist and rehab team continue to assess barriers to  discharge/monitor patient progress toward functional and medical goals   Care Tool:  Bathing    Body parts bathed by patient: Right arm, Left arm, Chest, Abdomen, Front perineal area, Buttocks, Right upper leg, Left upper leg, Face, Left lower leg   Body parts bathed by helper: Right lower leg     Bathing assist Assist Level: Contact Guard/Touching assist     Upper Body Dressing/Undressing Upper body dressing   What is the patient wearing?: Pull over shirt    Upper body assist Assist Level: Set up assist    Lower Body Dressing/Undressing Lower body dressing      What is the patient wearing?: Pants, Incontinence brief     Lower body assist Assist for lower body dressing: Contact Guard/Touching assist     Toileting Toileting    Toileting assist Assist for toileting: Set up assist     Transfers Chair/bed transfer  Transfers assist     Chair/bed transfer assist level: Minimal Assistance - Patient > 75% Chair/bed transfer assistive device: Programmer, multimedia   Ambulation assist      Assist level: 2 helpers (+2 min A) Assistive device: Hand held assist Max distance: 13ft   Walk 10 feet activity   Assist     Assist level: 2 helpers (+2 min A) Assistive device: Hand held assist   Walk 50 feet activity   Assist Walk 50 feet with 2 turns activity did not  occur: Safety/medical concerns  Assist level: 2 helpers (+2 min A) Assistive device: Hand held assist    Walk 150 feet activity   Assist Walk 150 feet activity did not occur: Safety/medical concerns  Assist level: Minimal Assistance - Patient > 75% Assistive device: Walker-rolling    Walk 10 feet on uneven surface  activity   Assist Walk 10 feet on uneven surfaces activity did not occur: Safety/medical concerns         Wheelchair     Assist Will patient use wheelchair at discharge?: No             Wheelchair 50 feet with 2 turns activity    Assist             Wheelchair 150 feet activity     Assist           Medical Problem List and Plan: 1.  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    Continue CIR  WHO ordered for dysesthesias- CVA related   Lipid profile within normal limits on 11/9 2.  Antithrombotics: -DVT/anticoagulation:  Pharmaceutical: Other (comment)--now on Eliquis.              -antiplatelet therapy: N/A 3. Pain Management: Tylenol prn. Denies pain.  4. Mood: LCSW to follow for evaluation and support.              -antipsychotic agents: N/A 5. Neuropsych: This patient is capable of making decisions on her own behalf. 6. Skin/Wound Care: Routine pressure relief measures.  7. Fluids/Electrolytes/Nutrition: Monitor I/Os.   8. HTN: Monitor BP. Avoid LBP due to L-PCA stenosis  Appreciate cardiology recs  Norvasc 2.5 started on 11/11, increased to 5 on 11/14, increased to 7.5 on 11/18  Remains elevated on 11/19, would not make any changes today given increase yesterday.          Vitals:   04/19/20 1918 04/20/20 0544  BP: (!) 148/69 (!) 145/74  Pulse: 64 (!) 59  Resp: 18 16  Temp: 98 F (36.7 C) 97.8 F (36.6 C)  SpO2: 94% 98%  11/22: BP is slightly elevated. Continue to monitor.  9. A fib s/p cardioversion: Monitor HR.             Monitor with increased exertion  Heart rate low to normal   Appreciate cardiology recs 10. T2DM with hyperglycemia: Hgb A1c- 7.2. Monitor BS ac/hs. Diabetic education  11/22: Elevated to 189 last night, continue to monitor.     CBG (last 3)  Recent Labs    04/19/20 2112 04/20/20 0610 04/20/20 1127  GLUCAP 189* 103* 101*    11. Breast cancer: Arimidex--diagnosed in August but not had follow up due to multiple admissions secondary to cardiac issues. Family has been  requesting referral to Hem/onc/surgeon in town-appointment on 11/30 12. ABLA  Hb 10.9 on 11/15, 12.1 on 11/22  Cont to monitor 13. Slow transit constipation  Bowel meds increased on 11/10  Improving  LOS: 19 days A FACE TO FACE EVALUATION WAS PERFORMED  Laryssa Hassing P Jasher Barkan 04/20/2020, 12:00 PM

## 2020-04-20 NOTE — Progress Notes (Signed)
Occupational Therapy Session Note  Patient Details  Name: Courtney Coleman MRN: [id number] Date of Birth: [birth year]  Today's Date: 04/20/2020 OT Individual Time: 6384-5364 OT Individual Time Calculation (min): 48 min    Short Term Goals: Week 3:  OT Short Term Goal 1 (Week 3): STGs=LTGs due to ELOS  Skilled Therapeutic Interventions/Progress Updates:    Pt requesting to "work on my hand" referring to right hand motor control.  Pt self propelled approximately 30 feet with supervision and increased time.  Pt transported to large gym for time mgt.  Pt participated in standing dynamic balance with RUE reach, pinch and place component.  Pt required increased time to pinch and difficulty manipulating clothespin within hand when needed and using table to compensate to reposition clothespin.  Pt able to stand with CGA with one episode of rapid descent to chair requiring min assist to sit safely.  Pt participated in seated tabletop [facility] activities including finger<>palm manipulation using small blocks, thumb tack pinch/place/pull activity, and threading small beads using RUE as dominant mover.  Improved manipulation of small blocks today compared to last attempt, however unable to pinch small thumbtacks and manipulate in hand therefore downgraded to placing tacks in upright position for pt to pinch and pull/place.  Pt transported to room, call bell in reach, seat belt alarm on.   Therapy Documentation Precautions:  Precautions Precautions: Fall Precaution Comments: R hemiparesis, orthostasis Restrictions Weight Bearing Restrictions: No   Therapy/Group: Individual Therapy  Ezekiel Slocumb 04/20/2020, 3:30 PM

## 2020-04-20 NOTE — Progress Notes (Signed)
Physical Therapy Session Note  Patient Details  Name: Courtney Coleman  MRN: [id number] Date of Birth: June 07, 1965  Today's Date: 04/20/2020 PT Individual Time: 0800-0900 + 1445-1530 PT Individual Time Calculation (min): 60 min  + 45 min  Short Term Goals: Week 2:  PT Short Term Goal 1 (Week 2): Pt will completed bed mobility with supervision PT Short Term Goal 2 (Week 2): Pt will complete bed<>chair transfers with CGA and LRAD PT Short Term Goal 3 (Week 2): Pt will ambulate 18ft with minA and LRAD PT Short Term Goal 4 (Week 2): Pt will negotiate up/down x4 steps with minA and LRAD Week 3:  PT Short Term Goal 1 (Week 3): STG = LTG due to ELOS  Skilled Therapeutic Interventions/Progress Updates:   1st session:   Pt received sitting in w/c, fiance at bedside, pt agreeable to PT session with no reports of pain. W/c transport for time management from her room to main therapy gym, squat<>pivot with close supervision from w/c to mat table. Performed the following there-ex/activity at mat table: -1x10 repeated sit<>stands with close supervision -1x10 repeated goblet squats with 4lb med ball and CGA -1x10 repeated goblet squats with 4lb med ball + upward reach with CGA -1x10 seated 4lb med ball upward reach -2x20 standing LLE toe taps on 1inch platform with minA guard focusing on R knee stability (x1 large LOB requiring abrupt sitting due to inability to correct) -2x5 repeated unsupported sit<>stands with task overlay for picking up cone from floor with RUE, requiring minA guard for stability  Performed interval gait training with minA guard and RW, ambulating 2x234ft (seated rest break) on level surfaces. Gait deficits include intermittent R toe drag, listing L with RW, and mild unsteadiness with turns. Cues throughout for increasing R heel strike, RW management, and normalizing gait pattern. Also performed gait training 2x97ft with minA and RW, stepping over x5 cones spaced ~3ft apart, with RLE  only, emphasizing RLE clearance, R hamstring/knee flexion, and obstacle training. Pt required to stop ambulating before stepping over, unable to perform sequential movements while navigating obstacles. Squat<>pivot with close supervision from mat table back to w/c and returned to her room where she remained seated in w/c with needs in reach, fiance at bedside.  2nd session: Pt received sitting in w/c, agreeable to PT session, fiance at bedside. Focus of session to perform hands-on family training to prepare for safe DC home where 24/7 S/A will be provided. Wheeled pt in w/c to main therapy gym. Started with stair training where she navigated up/down x4 steps with modA and 1 HR support, via side stepping method. Provided stair training in order to demonstrate to Fiance why stairs should be avoided at home until improved mobility. Fiance voiced understanding. Next, performed curb training as pt has 1 curb step to enter home. Therapist provided minA guard with RW and performed x6 bouts with 6.5inch platform. She required reminders for proper stepping pattern "up with strong, down with weak." After seated rest break, fiance provided hands-on assist with therapist providing close supervision where she performed curb training. x1 instance where she stepped down with L foot leading rather than R, resulting in x1 moderate LOB due to R knee buckling. Again, required reminders for proper stepping pattern to reduce this risk of R knee buckling. After another seated rest, she ambulated ~238ft with RW and Fiance providing minA guard. Fiance demonstrating good body mechanics and guarding technique. All questions and concerns addressed regarding home safety training. Returned patient to her room  in her w/c where she remained seated in chair with needs in reach, fiance at bedside.  Therapy Documentation Precautions:  Precautions Precautions: Fall Precaution Comments: R hemiparesis, orthostasis Restrictions Weight Bearing  Restrictions: No  Therapy/Group: Individual Therapy  Rayford Williamsen P Thanh Mottern PT 04/20/2020, 8:20 AM

## 2020-04-20 NOTE — Plan of Care (Signed)
  Problem: RH BOWEL ELIMINATION Goal: RH STG MANAGE BOWEL WITH ASSISTANCE Description: STG Manage Bowel with min Assistance. Outcome: Progressing   Problem: RH SAFETY Goal: RH STG ADHERE TO SAFETY PRECAUTIONS W/ASSISTANCE/DEVICE Description: STG Adhere to Safety Precautions With modi Assistance/Device. Outcome: Progressing Goal: RH STG DECREASED RISK OF FALL WITH ASSISTANCE Description: STG Decreased Risk of Fall With modi Assistance. Outcome: Progressing

## 2020-04-21 ENCOUNTER — Inpatient Hospital Stay (HOSPITAL_COMMUNITY): Payer: No Typology Code available for payment source

## 2020-04-21 ENCOUNTER — Other Ambulatory Visit (HOSPITAL_COMMUNITY): Payer: Self-pay | Admitting: Physical Medicine and Rehabilitation

## 2020-04-21 ENCOUNTER — Inpatient Hospital Stay (HOSPITAL_COMMUNITY): Payer: No Typology Code available for payment source | Admitting: Occupational Therapy

## 2020-04-21 DIAGNOSIS — I69398 Other sequelae of cerebral infarction: Secondary | ICD-10-CM

## 2020-04-21 DIAGNOSIS — I951 Orthostatic hypotension: Secondary | ICD-10-CM

## 2020-04-21 DIAGNOSIS — R209 Unspecified disturbances of skin sensation: Secondary | ICD-10-CM

## 2020-04-21 LAB — GLUCOSE, CAPILLARY
Glucose-Capillary: 97 mg/dL (ref 70–99)
Glucose-Capillary: 92 mg/dL (ref 70–99)
Glucose-Capillary: 141 mg/dL — ABNORMAL HIGH (ref 70–99)
Glucose-Capillary: 121 mg/dL — ABNORMAL HIGH (ref 70–99)

## 2020-04-21 MED ORDER — HYDRALAZINE HCL 10 MG PO TABS: 10.0000 mg | ORAL_TABLET | Freq: Three times a day (TID) | ORAL | 0 refills | Status: DC

## 2020-04-21 MED ORDER — AMLODIPINE BESYLATE 5 MG PO TABS: 7.5000 mg | ORAL_TABLET | Freq: Every day | ORAL | 0 refills | Status: DC

## 2020-04-21 MED ORDER — POLYETHYLENE GLYCOL 3350 17 G PO PACK: 17.0000 g | PACK | Freq: Two times a day (BID) | ORAL | 0 refills | Status: DC

## 2020-04-21 MED ORDER — ANASTROZOLE 1 MG PO TABS: 1.0000 mg | ORAL_TABLET | Freq: Every day | ORAL | 0 refills | Status: DC

## 2020-04-21 MED ORDER — APIXABAN 5 MG PO TABS: 5.0000 mg | ORAL_TABLET | Freq: Two times a day (BID) | ORAL | 0 refills | Status: DC

## 2020-04-21 MED ORDER — SACUBITRIL-VALSARTAN 49-51 MG PO TABS: 1.0000 | ORAL_TABLET | Freq: Two times a day (BID) | ORAL | 0 refills | Status: DC

## 2020-04-21 MED ORDER — ATORVASTATIN CALCIUM 40 MG PO TABS: 40.0000 mg | ORAL_TABLET | Freq: Every day | ORAL | 0 refills | Status: DC

## 2020-04-21 MED ORDER — AMIODARONE HCL 100 MG PO TABS: 100.0000 mg | ORAL_TABLET | Freq: Every day | ORAL | 0 refills | Status: DC

## 2020-04-21 MED FILL — ATORVASTATIN CALCIUM 40 MG: 40 | 30 days supply | Qty: 30 | Fill #0

## 2020-04-21 MED FILL — AMLODIPINE BESYLATE 5 MG TA: 5 | 30 days supply | Qty: 45 | Fill #0

## 2020-04-21 MED FILL — hydrALAZINE HCL 10 MG TABS: 10 | 30 days supply | Qty: 90 | Fill #0

## 2020-04-21 MED FILL — POLYETHYLENE GLYCOL 3350 PO: 17 | 30 days supply | Qty: 510 | Fill #0

## 2020-04-21 MED FILL — ELIQUIS 5 MG TABLET: 5 | 30 days supply | Qty: 60 | Fill #0

## 2020-04-21 MED FILL — ENTRESTO 49 MG-51 MG TABLET: 49-51 | 30 days supply | Qty: 60 | Fill #0

## 2020-04-21 MED FILL — ANASTROZOLE 1 MG TABLET: 1 | 30 days supply | Qty: 30 | Fill #0

## 2020-04-21 MED FILL — AMIODARONE HCL 200 MG TAB: 200 | 30 days supply | Qty: 15 | Fill #0

## 2020-04-21 NOTE — Progress Notes (Signed)
[place] PHYSICAL MEDICINE & REHABILITATION PROGRESS NOTE  Subjective/Complaints: Patient seen sitting up in bed this AM.  She states she slept well overnight.  She is excited about discharge tomorrow.  She has questions regarding discharge medications.   ROS: Denies CP, SOB, N/V/D  Objective: Vital Signs: Blood pressure (!) 171/98, pulse 70, temperature 98.6 F (37 C), temperature source Oral, resp. rate 18, height 5\' 6"  (1.676 m), weight 107.7 kg, SpO2 100 %. No results found. Recent Labs    04/20/20 0743  WBC 5.7  HGB 12.1  HCT 39.3  PLT 258   Recent Labs    04/20/20 0743  NA 138  K 3.5  CL 104  CO2 26  GLUCOSE 139*  BUN 13  CREATININE 0.74  CALCIUM 9.2    Intake/Output Summary (Last 24 hours) at 04/21/2020 1000 Last data filed at 04/21/2020 0900 Gross per 24 hour  Intake 920 ml  Output 100 ml  Net 820 ml     Physical Exam: BP (!) 171/98 (BP Location: Left Arm)   Pulse 70   Temp 98.6 F (37 C) (Oral)   Resp 18   Ht 5\' 6"  (1.676 m)   Wt 107.7 kg   SpO2 100%   BMI 38.32 kg/m   Constitutional: No distress . Vital signs reviewed. HENT: Normocephalic.  Atraumatic. Eyes: EOMI. No discharge. Cardiovascular: No JVD.  RRR. Respiratory: Normal effort.  No stridor.  Bilateral clear to auscultation. GI: Non-distended.  BS +. Skin: Warm and dry.  Intact. Psych: Normal mood.  Normal behavior. Musc: No edema in extremities.  No tenderness in extremities. Neuro: Alert Mild right facial weakness, unchanged Motor: LUE/LLE: 5/5 proximal distal RUE: 4+/5 proximal distal with apraxia, stable RLE: 4+/5 proximal distal, unchanged Decreased fine motor Right hand, stable No increase in tone appreciated  Assessment/Plan: 1. 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  Physiatrist is providing close team supervision and 24 hour management of active medical problems listed below.  Physiatrist and rehab team continue to assess barriers to discharge/monitor patient progress toward functional and medical goals   Care Tool:  Bathing    Body parts bathed by patient: Right arm, Left arm, Chest, Abdomen, Front perineal area, Buttocks, Right upper leg, Left upper leg, Face, Left lower leg   Body parts bathed by helper: Right lower leg     Bathing assist Assist Level: Contact Guard/Touching assist     Upper Body Dressing/Undressing Upper body dressing   What is the patient wearing?: Pull over shirt    Upper body assist Assist Level: Set up assist    Lower Body Dressing/Undressing Lower body dressing      What is the patient wearing?: Pants, Incontinence brief     Lower body assist Assist for lower body dressing: Contact Guard/Touching assist     Toileting Toileting    Toileting assist Assist for toileting: Set up assist     Transfers Chair/bed transfer  Transfers assist     Chair/bed transfer assist level: Minimal Assistance - Patient > 75% Chair/bed transfer assistive device: Programmer, multimedia   Ambulation assist      Assist level: 2 helpers (+2 min A) Assistive device: Hand held assist Max distance: 110ft   Walk 10 feet activity   Assist  Assist level: 2 helpers (+2 min A) Assistive device: Hand held assist   Walk 50 feet activity   Assist Walk 50 feet with 2 turns activity did not occur: Safety/medical concerns  Assist level: 2 helpers (+2 min A) Assistive device: Hand held assist    Walk 150 feet activity   Assist Walk 150 feet activity did not occur: Safety/medical concerns  Assist level: Minimal Assistance - Patient > 75% Assistive device: Walker-rolling    Walk 10 feet on uneven surface  activity   Assist Walk 10 feet on uneven  surfaces activity did not occur: Safety/medical concerns         Wheelchair     Assist Will patient use wheelchair at discharge?: No             Wheelchair 50 feet with 2 turns activity    Assist            Wheelchair 150 feet activity     Assist           Medical Problem List and Plan: 1.  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    Continue CIR  WHO ordered for dysesthesias- CVA related, appears to be improving  Lipid profile within normal limits on 11/9 2.  Antithrombotics: -DVT/anticoagulation:  Pharmaceutical: Other (comment)--now on Eliquis.              -antiplatelet therapy: N/A 3. Pain Management: Tylenol prn. Denies pain.  4. Mood: LCSW to follow for evaluation and support.              -antipsychotic agents: N/A 5. Neuropsych: This patient is capable of making decisions on her own behalf. 6. Skin/Wound Care: Routine pressure relief measures.  7. Fluids/Electrolytes/Nutrition: Monitor I/Os.   8. HTN: Monitor BP. Avoid LBP due to L-PCA stenosis   Appreciate cardiology recs  Norvasc 2.5 started on 11/11, increased to 5 on 11/14, increased to 7.5 on 11/18            Vitals:   04/20/20 2013 04/21/20 0528  BP: (!) 122/93 (!) 171/98  Pulse: 67 70  Resp: 18 18  Temp: 98.4 F (36.9 C) 98.6 F (37 C)  SpO2: 99% 100%   Positive orthostatics on 11/23, monitor for symptoms 9. A fib s/p cardioversion: Monitor HR.             Monitor with increased exertion  Heart rate controlled on 11/23  Appreciate cardiology recs 10. T2DM with hyperglycemia: Hgb A1c- 7.2. Monitor BS ac/hs. Diabetic education  Labile on 11/23, monitor for trend CBG (last 3)  Recent Labs    04/20/20 1613 04/20/20 2049 04/21/20 0559  GLUCAP 100* 192* 97  11. Breast  cancer: Arimidex--diagnosed in August but not had follow up due to multiple admissions secondary to cardiac issues. Family has been requesting referral to Hem/onc/surgeon in town-appointment on 11/30 12. ABLA: Resolved  Hb 12.1 on 11/22  Cont to monitor 13. Slow transit constipation  Bowel meds increased on 11/10  Improved  LOS: 20 days A FACE TO FACE EVALUATION WAS PERFORMED  Cynitha Berte Lorie Phenix 04/21/2020, 10:00 AM

## 2020-04-21 NOTE — Progress Notes (Signed)
Speech Language Pathology Discharge Summary  Patient Details  Name: Courtney Coleman MRN: [id number] Date of Birth: [birth year]  Today's Date: 04/21/2020 SLP Individual Time: 1105-1210 SLP Individual Time Calculation (min): 65 min   Skilled Therapeutic Interventions:   Skilled ST services focused on education and language skills. SLP facilitated formal assessment of word finding skills. Pt demonstrated 10/10 on CLQT, and 5/8 (mild deficit) on Cognistat naming subsection. Pt demonstrated moderate deficits on abstract language (categorgization/convergent naming) on Cognistat subsection and moderate deficits in generative naming (divergent naming) subsections on CLQT. Pt demonstrated overall greater ability to express thoughts in conversation compared to structured task. SLP educated pt and fiance pertaining to use of handouts provided yesterday and continued focuse on word finding skills. All questions answered to satisfaction. Pt was left in room with call bell within reach and bed/chair alarm set. SLP recommends to continue skilled services.     Patient has met 3 of 3 long term goals.  Patient to discharge at overall Modified Independent;Supervision level.  Reasons goals not met:     Clinical Impression/Discharge Summary:   Pt made great progress meeting 3 out 3 goals, discharging at supervision - Mod I in communication and verbal error awareness. Pt demonstrates greater ability to express mildly complex thoughts/needs in conversation verse in structured tasks. SLP provided education and extensive handouts to continue word finding skills at discharge since follow up ST services might not be covered by insurance. Pt and Pt's fiance were very engaged in education throughout the length of stay at CIR. All questions answered to satisfaction. Pt benefited from skilled ST services in order to maximize functional independence and reduce burden of care, requiring supervision at discharge with continued  outpatient services would be beneficial.   Care Partner:  Caregiver Able to Provide Assistance: Yes  Type of Caregiver Assistance: Physical;Cognitive  Recommendation:  Outpatient SLP  Rationale for SLP Follow Up: Maximize functional communication;Maximize cognitive function and independence;Reduce caregiver burden   Equipment: N/A   Reasons for discharge: Discharged from hospital   Patient/Family Agrees with Progress Made and Goals Achieved: Yes    Suellyn Meenan  [facility] 04/21/2020, 12:18 PM

## 2020-04-21 NOTE — Progress Notes (Addendum)
Patient ID: Courtney Coleman, female   DOB: [birth year], 54 y.o.   MRN: [id number]  Spoke with [facility] who will accept the referral and provide HHPT and SP therapies at home. Pt and finance feel ready for discharge tomorrow. Pt is glad to be going to niece's and getting out of the hospital. See in am for any questions. Match completed and in system

## 2020-04-21 NOTE — Discharge Summary (Addendum)
Physician Discharge Summary  Patient ID: Courtney Coleman MRN: [id number] DOB/AGE: 08/04/1965 54 y.o.  Admit date: 04/01/2020 Discharge date: 04/21/2020  Discharge Diagnoses:  Principal Problem:   Thalamic stroke [facility]) Active Problems:   Acute blood loss anemia   Benign essential HTN   Slow transit constipation   Bradycardia   Malignant neoplasm of left female breast (HCC)   Dysesthesia   Alteration of sensations, post-stroke   Discharged Condition: Stable.  Significant Diagnostic Studies: N/AA  Labs:  Basic Metabolic Panel: BMP Latest Ref Rng & Units 04/20/2020 04/13/2020 04/06/2020  Glucose 70 - 99 mg/dL 139(H) 101(H) 115(H)  BUN 6 - 20 mg/dL 13 12 12   Creatinine 0.44 - 1.00 mg/dL 0.74 0.73 0.84  Sodium 135 - 145 mmol/L 138 138 138  Potassium 3.5 - 5.1 mmol/L 3.5 3.6 3.7  Chloride 98 - 111 mmol/L 104 106 103  CO2 22 - 32 mmol/L 26 24 26   Calcium 8.9 - 10.3 mg/dL 9.2 9.0 9.2    CBC: CBC Latest Ref Rng & Units 04/20/2020 04/13/2020 04/06/2020  WBC 4.0 - 10.5 K/uL 5.7 5.8 6.0  Hemoglobin 12.0 - 15.0 g/dL 12.1 10.9(L) 11.0(L)  Hematocrit 36 - 46 % 39.3 35.2(L) 35.6(L)  Platelets 150 - 400 K/uL 258 258 270    CBG: Recent Labs  Lab 04/20/20 1613 04/20/20 2049 04/21/20 0559 04/21/20 1144 04/21/20 1642  GLUCAP 100* 192* 97 92 141*    Brief HPI:   Courtney Coleman is a 54 y.o. female with history of HTN, breast cancer, A. fib s/p cardioversion who was admitted 03/27/2020 reports of 24-hour history of speech difficulty, mild confusion and right hemiparesis with difficulty walking.  UDS was negative.  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  Hospital course was further complicated by transient worsening of right-sided weakness on 10/30 due to orthostatic hypotension and she was treated with IV fluids and bedrest briefly.    Dr.  Erlinda Hong felt that stroke was embolic from A. fib and to avoid low blood pressure given left PCA stenosis.  Xarelto was changed to Eliquis due to failure of treatment.  Patient with resultant expressive/receptive aphasia, difficulty with complex commands, right inattention, orthostatics as well as headaches with right-sided weakness affecting ADLs and mobility.  CIR was recommended due to functional decline.   Hospital Course: Courtney Coleman was admitted to rehab 04/01/2020 for inpatient therapies to consist of PT, ST and OT at least three hours five days a week. Past admission physiatrist, therapy team and rehab RN have worked together to provide customized collaborative inpatient rehab.  She continues on Eliquis twice daily and is tolerating this without side effects. Dr. Terrence Dupont was consulted for input on mild bradycardia as well as uncontrolled hypertension..  Coreg was discontinued and amiodarone was decreased to 100 mg a day.  Her blood pressure control has improved with increase in Entresto to 49/51 twice daily as well as addition of amlodipine.  Orthostatic symptoms have resolved and she is showing improvement in activity tolerance.     Her diabetes has been monitored with ac/hs CBG checks and SSI was use prn for tighter BS control.  Blood sugars are currently controlled on diet alone and she has been educated on carb modified restriction.  Bowel program has been augmented to help manage constipation.  Serial check of CBC showed acute blood loss anemia which has resolved and  she is tolerating Eliquis without signs of bleeding.  Her mood has been stable and dysesthesias LUE has improved with use of WHO.  She has made steady progress during her stay and is currently at supervision level.  She received 1 month supply of medications.  [facility] pharmacy and has been set up for primary care follow-up after discharge.  Attempts were made to procure home health PT, OT and ST after discharge but she was denied due to lack of  insurance.   Rehab course: During patient's stay in rehab weekly team conferences were held to monitor patient's progress, set goals and discuss barriers to discharge. At admission, patient required assistance with ADL tasks and mod assist with mobility.  She exhibited mild expressive aphasia with mild right visual inattention and receptive deficits had mostly resolved.  She  has had improvement in activity tolerance, balance, postural control as well as ability to compensate for deficits. She has had improvement in functional use RUE  and RLE as well as improvement in awareness. She is able to complete ADL tasks with supervision.  She requires supervision with cues for transfers and is able to ambulate 200 feet with contact-guard to min assist with use of rolling walker. She is able to express herself at modified independent to supervision level. She demonstrates moderate deficits in expressive language but has greater ability to express thoughts in conversation.   Family education was completed with significant other regarding all aspects of care.  Disposition: Home  Diet: Heart healthy/carb modified  Special Instructions: 1.  Follow-up with PCP and hematology as per schedule appointments. 2. No driving or strenuous activity till cleared by MD.   Discharge Instructions     Ambulatory referral to Neurology   Complete by: As directed    An appointment is requested in approximately: 4 weeks   Ambulatory referral to Physical Medicine Rehab   Complete by: As directed    1-2 weeks TC appt      Allergies as of 04/22/2020   No Known Allergies      Medication List     STOP taking these medications    carvedilol 25 MG tablet Commonly known as: COREG   Entresto 24-26 MG Generic drug: sacubitril-valsartan Replaced by: sacubitril-valsartan 49-51 MG       TAKE these medications    amiodarone 100 MG tablet Commonly known as: PACERONE Take 1 tablet (100 mg total) by mouth  daily. What changed:  medication strength how much to take   amLODipine 5 MG tablet Commonly known as: NORVASC Take 1.5 tablets (7.5 mg total) by mouth daily.   anastrozole 1 MG tablet Commonly known as: ARIMIDEX Take 1 tablet (1 mg total) by mouth daily.   apixaban 5 MG Tabs tablet Commonly known as: ELIQUIS Take 1 tablet (5 mg total) by mouth 2 (two) times daily.   atorvastatin 40 MG tablet Commonly known as: LIPITOR Take 1 tablet (40 mg total) by mouth daily.   hydrALAZINE 10 MG tablet Commonly known as: APRESOLINE Take 1 tablet (10 mg total) by mouth every 8 (eight) hours.   polyethylene glycol 17 g packet Commonly known as: MIRALAX / GLYCOLAX Take 17 g by mouth 2 (two) times daily.   sacubitril-valsartan 49-51 MG Commonly known as: ENTRESTO Take 1 tablet by mouth 2 (two) times daily. Replaces: Entresto 24-26 MG        Follow-up Information     Jamse Arn, MD Follow up.   Specialty: Physical Medicine and Rehabilitation Contact information:  [address] [place] [address] [phone]         Nicholas Lose, MD Follow up on 04/28/2020.   Specialty: Hematology and Oncology Why: Appointment at 1 pm Contact information: [place] [address] Davie, [place], NP Follow up on 05/15/2020.   Specialty: Internal Medicine Why: Appointment at 8:50 am Contact information: [address] [place] [address] [place]. Call today.   Why: for appointment in 5-7 days if you have not heard from them.  Contact information: [address]     [address] [place] [address] [phone]        Charolette Forward, MD. Call.   Specialty: Cardiology Why: for follow up on Heart/A fib Contact information: [address] [place] [address] [phone]                 Signed: Bary Leriche 04/24/2020, 8:19 PM Patient was seen, face-face, and physical exam performed by me on day of discharge, greater than 30 minutes of total time spent.. Please see progress note from day of discharge as well.  Delice Lesch, MD, ABPMR

## 2020-04-21 NOTE — Progress Notes (Signed)
Occupational Therapy Discharge Summary  Patient Details  Name: Courtney Coleman MRN: [id number] Date of Birth: 07-18-1965  Today's Date: 04/21/2020 OT Individual Time: 2703-5009 OT Individual Time Calculation (min): 59 min    Patient has met 12 of 12 long term goals due to improved activity tolerance, improved balance, postural control, ability to compensate for deficits, functional use of  RIGHT upper and RIGHT lower extremity, improved awareness and improved coordination.  Patient to discharge at overall Supervision level.  Patient's care partner is independent to provide the necessary cognitive assistance at discharge.    Recommendation:  Patient will benefit from ongoing skilled OT through [facility].  Pt will also benefit from thorough HEP follow through provided by this OT to address RUE motor control and strength and BADLs.  Equipment: No equipment provided. Pt has purchased 3 in 1 commode for safe toileting.  Reasons for discharge: treatment goals met and discharge from hospital  Patient/family agrees with progress made and goals achieved: Yes   Skilled Intervention:  Pt sitting up in w/c, spouse present throughout session.  Pt and spouse demonstrated safe transfer and functional mobility techniques including sit<>stand from w/c, ambulation to bathroom using RW, and toilet transfer. Pts spouse asking to go over safety using tub shower.  OT re-educated pt and spouse of safety hazards with attempting to use tub shower on second floor until PT has cleared pt to safely ambulate to second floor as well as safety hazards with completing stand to sit to floor for bathtub use.  Pt and spouse report good understanding and receptiveness.   Pt's spouse provided appropriate supervision and VC's as needed throughout.  Assessed pt's RUE strength and coordination per below findings.  Pt participated in Box and Blocks assessment with following results: Right hand 19 blocks Left hand 57 blocks.  Pt score  the same as last attempt performed last week.  Emphasized to pt the importance of HEP follow through and provided handout to facilitate carryover at DC. Call bell in reach, seat belt alarm on.   OT Discharge Precautions/Restrictions  Precautions Precautions: Fall Precaution Comments: R hemiparesis Restrictions Weight Bearing Restrictions: No Pain Pain Assessment Pain Scale: 0-10 Pain Score: 0-No pain ADL ADL Eating: Set up Where Assessed-Eating: Chair Grooming: Setup Where Assessed-Grooming: Chair Upper Body Bathing: Setup Where Assessed-Upper Body Bathing: Sitting at sink Lower Body Bathing: Supervision/safety Where Assessed-Lower Body Bathing: Standing at sink Upper Body Dressing: Setup Where Assessed-Upper Body Dressing: Sitting at sink Lower Body Dressing: Supervision/safety Where Assessed-Lower Body Dressing: Standing at sink, Sitting at sink Toileting: Setup Where Assessed-Toileting: Bedside Commode Toilet Transfer: Close supervision Toilet Transfer Method: Stand pivot Science writer: Extra wide bedside commode Tub/Shower Transfer: Not assessed Social research officer, government: Not assessed Social research officer, government Method: Radiographer, therapeutic: Gaffer Baseline Vision/History: No visual deficits Patient Visual Report: No change from baseline Vision Assessment?: No apparent visual deficits Perception  Perception: Impaired Inattention/Neglect: Does not attend to right side of body (mild) Praxis Praxis: Impaired Praxis Impairment Details: Motor planning Cognition Overall Cognitive Status: Within Functional Limits for tasks assessed Arousal/Alertness: Awake/alert Orientation Level: Oriented X4 Attention: Sustained Sustained Attention: Appears intact Selective Attention: Appears intact Memory: Impaired Memory Impairment: Decreased recall of new information Awareness: Appears intact Problem Solving: Appears intact Problem  Solving Impairment: Functional basic Safety/Judgment: Appears intact Sensation Sensation Light Touch: Appears Intact (BUE) Hot/Cold: Appears Intact Proprioception: Impaired Detail Proprioception Impaired Details: Impaired RUE;Impaired RLE Stereognosis: Not tested Coordination Gross Motor Movements are Fluid and  Coordinated: No Fine Motor Movements are Fluid and Coordinated: No Coordination and Movement Description: decreased dexterity and fine motor control in RUE. Impaired proprioception resulting in impaired movement patterns in standing and functional tasks Finger Nose Finger Test: Impaired RUE; WNL LUE Motor  Motor Motor: Hemiplegia Motor - Skilled Clinical Observations: R hemiparesis Mobility  Bed Mobility Bed Mobility: Rolling Right;Rolling Left;Supine to Sit;Sit to Supine Rolling Right: Supervision/verbal cueing Rolling Left: Supervision/Verbal cueing Supine to Sit: Supervision/Verbal cueing Sit to Supine: Supervision/Verbal cueing Transfers Sit to Stand: Supervision/Verbal cueing Stand to Sit: Supervision/Verbal cueing  Trunk/Postural Assessment  Cervical Assessment Cervical Assessment: Within Functional Limits Thoracic Assessment Thoracic Assessment: Exceptions to [facility] (rounded shoulders) Lumbar Assessment Lumbar Assessment: Exceptions to [facility] (posterior pelvic tilt) Postural Control Postural Control: Within Functional Limits  Balance Balance Balance Assessed: Yes Standardized Balance Assessment Standardized Balance Assessment: Timed Up and Go Test Timed Up and Go Test TUG: Normal TUG Normal TUG (seconds): 19 Static Sitting Balance Static Sitting - Balance Support: Feet supported Static Sitting - Level of Assistance: 7: Independent Dynamic Sitting Balance Dynamic Sitting - Balance Support: During functional activity Dynamic Sitting - Level of Assistance: 5: Stand by assistance Static Standing Balance Static Standing - Balance Support: No upper extremity  supported Static Standing - Level of Assistance: 5: Stand by assistance Dynamic Standing Balance Dynamic Standing - Level of Assistance: 5: Stand by assistance Extremity/Trunk Assessment RUE Assessment RUE Assessment: Exceptions to [facility] Passive Range of Motion (PROM) Comments: WNL Active Range of Motion (AROM) Comments: WFL General Strength Comments: Shoulder 4+/5 all planes; elbow flex/ext 4/5; FA pro/sup 4-/5; grip 4-/5 RUE Body System: Neuro Brunstrum level for arm: Stage V Relative Independence from Synergy Brunstrum level for hand: Stage V Independence from basic synergies RUE Tone RUE Tone: Within Functional Limits LUE Assessment LUE Assessment: Within Functional Limits   Ezekiel Slocumb 04/21/2020, 12:24 PM

## 2020-04-21 NOTE — Discharge Summary (Signed)
Physical Therapy Discharge Summary  Patient Details  Name: Courtney Coleman MRN: [id number] Date of Birth: 1966-05-16  Today's Date: 04/21/2020 PT Individual Time: 1000-1057 PT Individual Time Calculation (min): 57 min   Patient has met 7 of 8 long term goals due to improved activity tolerance, improved balance, improved postural control, increased strength, ability to compensate for deficits, functional use of  right upper extremity and right lower extremity, improved attention, improved awareness and improved coordination.  Patient to discharge at an ambulatory level Supervision.   Patient's care partner is independent to provide the necessary physical assistance at discharge.  Reasons goals not met: Pt requiring minA for gait greater than ~50-59f with RW. Hands-on family training performed with Fiance during her stay.  Recommendation:  Patient will benefit from ongoing skilled PT services in outpatient setting to continue to advance safe functional mobility, address ongoing impairments in R hemibody weakness, decreased RLE/RUE proprioception, mild R inattention, decreased balance, gait deficits in order to minimize fall risk.  Equipment: RW  Reasons for discharge: treatment goals met and discharge from hospital  Patient/family agrees with progress made and goals achieved: Yes  PT Discharge Precautions/Restrictions Precautions Precautions: Fall Precaution Comments: R hemiparesis Restrictions Weight Bearing Restrictions: No Vital Signs Therapy Vitals Temp: 98.6 F (37 C) Temp Source: Oral Pulse Rate: 70 Resp: 18 BP: (!) 171/98 Patient Position (if appropriate): Lying Oxygen Therapy SpO2: 100 % O2 Device: Room Air Pain Pain Assessment Pain Scale: 0-10 Pain Score: Asleep Vision/Perception  Vision - Assessment Ocular Range of Motion: Within Functional Limits Perception Perception: Impaired Inattention/Neglect: Does not attend to right side of body  (Mild) Praxis Praxis: Impaired Praxis Impairment Details: Motor planning  Cognition Overall Cognitive Status: Within Functional Limits for tasks assessed Arousal/Alertness: Awake/alert Orientation Level: Oriented X4 Attention: Sustained;Selective Sustained Attention: Appears intact Selective Attention: Appears intact Problem Solving: Impaired Problem Solving Impairment: Functional basic Safety/Judgment: Appears intact Sensation Sensation Light Touch: Impaired by gross assessment Peripheral sensation comments: Able to detect light touch, diminished sensation Light Touch Impaired Details: Impaired RUE;Impaired RLE Hot/Cold: Appears Intact Proprioception: Impaired Detail Proprioception Impaired Details: Impaired RUE;Impaired RLE Stereognosis: Not tested Coordination Gross Motor Movements are Fluid and Coordinated: No Fine Motor Movements are Fluid and Coordinated: No Coordination and Movement Description: decreased dexterity and fine motor control in RUE. Impaired proprioception resulting in impaired movement patterns in standing and functional tasks Motor  Motor Motor: Hemiplegia Motor - Skilled Clinical Observations: R hemiparesis  Mobility Bed Mobility Bed Mobility: Rolling Right;Rolling Left;Supine to Sit;Sit to Supine Rolling Right: Supervision/verbal cueing Rolling Left: Supervision/Verbal cueing Supine to Sit: Supervision/Verbal cueing Sit to Supine: Supervision/Verbal cueing Transfers Transfers: Sit to Stand;Stand to Sit;Squat Pivot Transfers Sit to Stand: Supervision/Verbal cueing (with RW) Stand to Sit: Supervision/Verbal cueing (with RW) Stand Pivot Transfers: Contact Guard/Touching assist Stand Pivot Transfer Details: Visual cues/gestures for sequencing;Tactile cues for weight shifting;Verbal cues for safe use of DME/AE;Tactile cues for posture;Verbal cues for precautions/safety;Verbal cues for sequencing;Verbal cues for technique;Verbal cues for gait  pattern;Visual cues/gestures for precautions/safety;Visual cues for safe use of DME/AE Squat Pivot Transfers: Contact Guard/Touching assist Transfer (Assistive device): Rolling walker  Locomotion  Gait Ambulation: Yes Gait Assistance: Contact Guard/Touching assist;Minimal Assistance - Patient > 75% Gait Distance (Feet): 200 Feet Assistive device: Rolling walker Gait Assistance Details: Verbal cues for safe use of DME/AE;Verbal cues for precautions/safety;Verbal cues for technique;Verbal cues for gait pattern;Manual facilitation for weight shifting;Manual facilitation for placement;Verbal cues for sequencing;Tactile cues for weight shifting;Tactile cues for sequencing;Tactile cues for posture;Tactile cues for  placement;Visual cues for safe use of DME/AE Gait Assistance Details: Requires minA after ~80f of gait Gait Gait: Yes Gait Pattern: Impaired Gait Pattern: Decreased stance time - right;Decreased step length - right;Step-through pattern;Decreased dorsiflexion - right;Decreased weight shift to right;Narrow base of support;Trunk flexed;Right genu recurvatum;Right foot flat Gait velocity: decreased Stairs / Additional Locomotion Stairs: Yes Stairs Assistance: Moderate Assistance - Patient 50 - 74% Stair Management Technique: Step to pattern;Sideways;One rail Right Number of Stairs: 4 Height of Stairs: 6 Ramp: Minimal Assistance - Patient >75% Curb: Minimal Assistance - Patient >75% Wheelchair Mobility Wheelchair Mobility: No  Trunk/Postural Assessment  Cervical Assessment Cervical Assessment: Within Functional Limits Thoracic Assessment Thoracic Assessment: Exceptions to [facility](Rounded shoulders) Lumbar Assessment Lumbar Assessment: Exceptions to [facility](post pelvic tilt) Postural Control Postural Control: Within Functional Limits  Balance Balance Balance Assessed: Yes Standardized Balance Assessment Standardized Balance Assessment: Timed Up and Go Test Timed Up and Go Test TUG:  Normal TUG Normal TUG (seconds): 19.8 Static Sitting Balance Static Sitting - Balance Support: Feet supported Static Sitting - Level of Assistance: 7: Independent Dynamic Sitting Balance Dynamic Sitting - Balance Support: During functional activity Dynamic Sitting - Level of Assistance: 5: Stand by assistance Static Standing Balance Static Standing - Balance Support: No upper extremity supported Static Standing - Level of Assistance: 5: Stand by assistance  TUG on 11/16: 26.6 seconds TUG on 11/23: 19.8 seconds 5xSTS on 11/16: 14.3 seconds 5xSTS on 11/23: 10.4 seconds  Extremity Assessment    LUE Assessment LUE Assessment: Within Functional Limits RLE Assessment RLE Assessment: Exceptions to [facility] Strength Comments: Ankle DF 3+/5, knee ext 4-/5, knee flex 3+/5, hip flex 3+/5 LLE Assessment LLE Assessment: Within Functional Limits   Skilled Intervention: Pt greeted seated in w/c, agreeable to PT session, no reports of pain but endorses excitement for upcoming DC tomorrow. Reviewed general home safety precautions with her and she voiced good understanding. W/c transport for time management from her room to ortho gym to perform car transfers. She performed x2 car transfers, initially requiring minA due to attempting side-stepping method causing R knee buckling requiring minA for recovery. After instructing on safe technique (sit>turn) she was able to complete with CGA. Reviewed this with fiance when we returned to her room at end of session to ensure carryover. Pt then ambulated 1520fwith CGA/minA and RW on level surfaces, continues to show intermittent R foot drag and mild R knee instability during stance with L veering with RW. Performed TUG and 5xSTS. TUG score improved from 26.6 seconds on 11/16 to 19.8 seconds on 11/23. She also improved 5xSTS from 14.3 seconds on 11/16 to 10.4 seconds on 11/23. She also performed repeated picking up cone from ground with CGA/minA guard from  standing position, 1x10 reps. She then performed repeated stands with overhead reaching with small soccer ball, emphasizing elongation of thoarcic spine and functional overhead reaching. She then performed standing rotations with ball, focusing on thoracic rotation, RLE weight bearing, and dynamic balance .She ambulated back to her room, ~20067fwith CGA/minA and RW and remained seated in w/c with fiance at bedside, needs in reach.  Courtney Coleman P Courtney Coleman PT, DPT 04/21/2020, 7:49 AM

## 2020-04-21 NOTE — Progress Notes (Signed)
Inpatient Rehabilitation Care Coordinator  Discharge Note  The overall goal for the admission was met for:   Discharge location: Yes-HOME TO [facility] [place]  Length of Stay: Yes-21 DAYS  Discharge activity level: Yes-SUPERVISION-CGA LEVEL  Home/community participation: Yes  Services provided included: MD, RD, PT, OT, SLP, RN, CM, TR, Pharmacy, Neuropsych and SW  Financial Services: Other: PH[employer]-shared basically uninsured  Follow-up services arranged: [place] DUE TO HAS INSURANCE. [facility]  Comments (or additional information):Courtney Coleman WAS HERE DAILY AND PARTICIPATED IN THERAPIES WILL PROVIDE 24 HR CARE AT [facility]. MATCH GIVEN TO ASSIST WITH MEDICATIONS AND PCP ARRANGED VIA [place] APPOINTMENT 12/17 @ 8:50 AM  Patient/Family verbalized understanding of follow-up arrangements: Yes  Individual responsible for coordination of the follow-up plan: Courtney Coleman-FIANCE [phone]  Confirmed correct DME delivered: Courtney Coleman 04/21/2020    Courtney Coleman, [place]

## 2020-04-22 LAB — GLUCOSE, CAPILLARY: Glucose-Capillary: 110 mg/dL — ABNORMAL HIGH (ref 70–99)

## 2020-04-22 NOTE — Discharge Instructions (Signed)
Inpatient Rehab Discharge Instructions  Courtney Coleman Discharge date and time:  04/22/20  Activities/Precautions/ Functional Status: Activity: no lifting, driving, or strenuous exercise till cleared by MD Diet: cardiac diet and diabetic diet Wound Care: none needed    Functional status:  ___ No restrictions     ___ Walk up steps independently _X__ 24/7 supervision/assistance   ___ Walk up steps with assistance ___ Intermittent supervision/assistance  ___ Bathe/dress independently ___ Walk with walker     ___ Bathe/dress with assistance ___ Walk Independently    ___ Shower independently ___ Walk with assistance    _X__ Shower with assistance _X__ No alcohol     ___ Return to work/school ________  Special Instructions:   COMMUNITY REFERRALS UPON DISCHARGE:   UNABLE TO [place] DUE TO [place] Equipment/Items [place]                                                 Agency/Supplier:[facility]  [phone]  Match given for assistance with medications  STROKE/TIA DISCHARGE INSTRUCTIONS SMOKING Cigarette smoking nearly doubles your risk of having a stroke & is the single most alterable risk factor  If you smoke or have smoked in the last 12 months, you are advised to quit smoking for your health.  Most of the excess cardiovascular risk related to smoking disappears within a year of stopping.  Ask you doctor about anti-smoking medications  [place] Quit Line: 1-800-QUIT NOW  Free Smoking Cessation Classes [phone]  CHOLESTEROL Know your levels; limit fat & cholesterol in your diet  Lipid Panel     Component Value Date/Time   CHOL 142 04/07/2020 0649   TRIG 47 04/07/2020 0649   HDL 48 04/07/2020 0649   CHOLHDL 3.0 04/07/2020 0649   VLDL 9 04/07/2020 0649   LDLCALC 85 04/07/2020 0649      Many patients benefit from treatment even if their cholesterol is at  goal.  Goal: Total Cholesterol (CHOL) less than 160  Goal:  Triglycerides (TRIG) less than 150  Goal:  HDL greater than 40  Goal:  LDL (LDLCALC) less than 100   BLOOD PRESSURE American Stroke Association blood pressure target is less that 120/80 mm/Hg  Your discharge blood pressure is:  BP: (!) 159/72  Monitor your blood pressure  Limit your salt and alcohol intake  Many individuals will require more than one medication for high blood pressure  DIABETES (A1c is a blood sugar average for last 3 months) Goal HGBA1c is under 7% (HBGA1c is blood sugar average for last 3 months)  Diabetes:     Lab Results  Component Value Date   HGBA1C 7.2 (H) 03/28/2020     Your HGBA1c can be lowered with medications, healthy diet, and exercise.  Check your blood sugar as directed by your physician  Call your physician if you experience unexplained or low blood sugars.  PHYSICAL ACTIVITY/REHABILITATION Goal is 30 minutes at least 4 days per week  Activity: No driving, Therapies: see above Return to work: N/A  Activity decreases your risk of heart attack and stroke and makes your heart stronger.  It helps control your weight and blood pressure; helps you relax and can improve your mood.  Participate in a regular exercise program.  Talk with your doctor about  the best form of exercise for you (dancing, walking, swimming, cycling).  DIET/WEIGHT Goal is to maintain a healthy weight  Your discharge diet is:  Diet Order            Diet heart healthy/carb modified Room service appropriate? Yes; Fluid consistency: Thin; Fluid restriction: 1500 mL Fluid  Diet effective now               liquids Your height is:  Height: 5\' 6"  (167.6 cm) Your current weight is: Weight: 107.7 kg Your Body Mass Index (BMI) is:  BMI (Calculated): 38.34  Following the type of diet specifically designed for you will help prevent another stroke.  Your goal weight is: 155 lbs  Your goal Body Mass Index (BMI) is  19-24.  Healthy food habits can help reduce 3 risk factors for stroke:  High cholesterol, hypertension, and excess weight.  RESOURCES Stroke/Support Group:  Call [phone]   STROKE EDUCATION PROVIDED/REVIEWED AND GIVEN TO PATIENT Stroke warning signs and symptoms How to activate emergency medical system (call 911). Medications prescribed at discharge. Need for follow-up after discharge. Personal risk factors for stroke. Pneumonia vaccine given:  Flu vaccine given:  My questions have been answered, the writing is legible, and I understand these instructions.  I will adhere to these goals & educational materials that have been provided to me after my discharge from the hospital.    My questions have been answered and I understand these instructions. I will adhere to these goals and the provided educational materials after my discharge from the hospital.  Patient/Caregiver Signature _______________________________ Date __________  Clinician Signature _______________________________________ Date __________  Please bring this form and your medication list with you to all your follow-up doctor's appointments.    Information on my medicine - ELIQUIS (apixaban)  This medication education was reviewed with me or my healthcare representative as part of my discharge preparation.   Why was Eliquis prescribed for you? Eliquis was prescribed for you to reduce the risk of a blood clot forming that can cause a stroke if you have a medical condition called atrial fibrillation (a type of irregular heartbeat).  What do You need to know about Eliquis ? Take your Eliquis TWICE DAILY - one tablet in the morning and one tablet in the evening with or without food. If you have difficulty swallowing the tablet whole please discuss with your pharmacist how to take the medication safely.  Take Eliquis exactly as prescribed by your doctor and DO NOT stop taking Eliquis without talking to the doctor  who prescribed the medication.  Stopping may increase your risk of developing a stroke.  Refill your prescription before you run out.  After discharge, you should have regular check-up appointments with your healthcare provider that is prescribing your Eliquis.  In the future your dose may need to be changed if your kidney function or weight changes by a significant amount or as you get older.  What do you do if you miss a dose? If you miss a dose, take it as soon as you remember on the same day and resume taking twice daily.  Do not take more than one dose of ELIQUIS at the same time to make up a missed dose.  Important Safety Information A possible side effect of Eliquis is bleeding. You should call your healthcare provider right away if you experience any of the following: ? Bleeding from an injury or your nose that does not stop. ? Unusual colored urine (red or  dark brown) or unusual colored stools (red or black). ? Unusual bruising for unknown reasons. ? A serious fall or if you hit your head (even if there is no bleeding).  Some medicines may interact with Eliquis and might increase your risk of bleeding or clotting while on Eliquis. To help avoid this, consult your healthcare provider or pharmacist prior to using any new prescription or non-prescription medications, including herbals, vitamins, non-steroidal anti-inflammatory drugs (NSAIDs) and supplements.  This website has more information on Eliquis (apixaban): http://[email or web address].com/[email or web address]

## 2020-04-22 NOTE — Progress Notes (Signed)
[facility] PHYSICAL MEDICINE & REHABILITATION PROGRESS NOTE  Subjective/Complaints: Patient getting ready for discharge this AM.  Celesta Gentile present, who has questions regarding discharge meds.  Patient is very excited about discharge.   ROS: Denies CP, SOB, N/V/D  Objective: Vital Signs: Blood pressure 134/68, pulse (!) 58, temperature (!) 97.5 F (36.4 C), resp. rate 18, height 5\' 6"  (1.676 m), weight 105.2 kg, SpO2 98 %. No results found. Recent Labs    04/20/20 0743  WBC 5.7  HGB 12.1  HCT 39.3  PLT 258   Recent Labs    04/20/20 0743  NA 138  K 3.5  CL 104  CO2 26  GLUCOSE 139*  BUN 13  CREATININE 0.74  CALCIUM 9.2    Intake/Output Summary (Last 24 hours) at 04/22/2020 1026 Last data filed at 04/22/2020 0555 Gross per 24 hour  Intake 660 ml  Output 125 ml  Net 535 ml     Physical Exam: BP 134/68 (BP Location: Left Arm)    Pulse (!) 58    Temp (!) 97.5 F (36.4 C)    Resp 18    Ht 5\' 6"  (1.676 m)    Wt 105.2 kg    SpO2 98%    BMI 37.43 kg/m   Constitutional: No distress . Vital signs reviewed. HENT: Normocephalic.  Atraumatic. Eyes: No discharge. Cardiovascular: No JVD.   Respiratory: Normal effort.  No stridor.   Psych: Normal mood.  Normal behavior. Neuro: Alert Mild right facial weakness, unchanged Motor: LUE/LLE: 5/5 proximal distal RUE: 4+/5 proximal distal with apraxia, stable RLE: 4+/5 proximal distal, unchanged Decreased fine motor Right hand, stable No increase in tone appreciated  Assessment/Plan: 1. 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  Physiatrist is providing close team supervision and 24 hour management of active medical problems listed below.  Physiatrist and rehab team continue to assess  barriers to discharge/monitor patient progress toward functional and medical goals   Care Tool:  Bathing    Body parts bathed by patient: Right arm, Left arm, Chest, Abdomen, Front perineal area, Buttocks, Right upper leg, Left upper leg, Face, Left lower leg   Body parts bathed by helper: Right lower leg     Bathing assist Assist Level: Supervision/Verbal cueing     Upper Body Dressing/Undressing Upper body dressing   What is the patient wearing?: Pull over shirt    Upper body assist Assist Level: Set up assist    Lower Body Dressing/Undressing Lower body dressing      What is the patient wearing?: Pants, Incontinence brief     Lower body assist Assist for lower body dressing: Supervision/Verbal cueing     Toileting Toileting    Toileting assist Assist for toileting: Set up assist     Transfers Chair/bed transfer  Transfers assist     Chair/bed transfer assist level: Contact Guard/Touching assist Chair/bed transfer assistive device: Programmer, multimedia   Ambulation assist      Assist level: Minimal Assistance - Patient > 75% Assistive device: Walker-rolling Max distance: 223ft   Walk 10 feet activity   Assist     Assist level: Contact Guard/Touching assist Assistive device: Walker-rolling   Walk 50 feet activity   Assist Walk 50 feet with 2 turns activity did not occur: Safety/medical concerns  Assist level: Contact Guard/Touching assist Assistive device: Walker-rolling    Walk 150 feet activity   Assist Walk 150 feet activity did not occur: Safety/medical concerns  Assist level: Minimal Assistance - Patient > 75% Assistive device: Walker-rolling    Walk 10 feet on uneven surface  activity   Assist Walk 10 feet on uneven surfaces activity did not occur: Safety/medical concerns   Assist level: Minimal Assistance - Patient > 75% Assistive device: Aeronautical engineer Will patient use  wheelchair at discharge?: No             Wheelchair 50 feet with 2 turns activity    Assist            Wheelchair 150 feet activity     Assist           Medical Problem List and Plan: 1.  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    DC today  Will see patient for transitional care management in 1-2 weeks post-discharge  WHO ordered for dysesthesias- CVA related, appears to be improving  Lipid profile within normal limits on 11/9 2.  Antithrombotics: -DVT/anticoagulation:  Pharmaceutical: Other (comment)--now on Eliquis.              -antiplatelet therapy: N/A 3. Pain Management: Tylenol prn. Denies pain.  4. Mood: LCSW to follow for evaluation and support.              -antipsychotic agents: N/A 5. Neuropsych: This patient is capable of making decisions on her own behalf. 6. Skin/Wound Care: Routine pressure relief measures.  7. Fluids/Electrolytes/Nutrition: Monitor I/Os.   8. HTN: Monitor BP. Avoid LBP due to L-PCA stenosis  Appreciate cardiology recs  Norvasc 2.5 started on 11/11, increased to 5 on 11/14, increased to 7.5 on 11/18            Vitals:   04/21/20 1942 04/22/20 0445  BP: 134/68 134/68  Pulse: 68 (!) 58  Resp: 18 18  Temp: (!) 97 F (36.1 C) (!) 97.5 F (36.4 C)  SpO2: 99% 98%   Relatively controlled on 11/24 9. A fib s/p cardioversion: Monitor HR.             Monitor with increased exertion  Heart rate controlled on 11/24  Appreciate cardiology recs 10. T2DM with hyperglycemia: Hgb A1c- 7.2. Monitor BS ac/hs. Diabetic education  Improving control on 11/24 CBG (last 3)  Recent Labs    04/21/20 1642 04/21/20 2101 04/22/20 0557  GLUCAP 141* 121* 110*  11. Breast cancer: Arimidex--diagnosed in August but not had follow up due to  multiple admissions secondary to cardiac issues. Family has been requesting referral to Hem/onc/surgeon in town-appointment on 11/30 12. ABLA: Resolved  Hb 12.1 on 11/22  Cont to monitor 13. Slow transit constipation  Bowel meds increased on 11/10  Improved  > 30 minutes spent in total in discharge planning between myself and PA regarding aforementioned, as well discussion regarding DME equipment, follow-up appointments, follow-up therapies, discharge medications, discharge recommendations, particularly given lack of insurance  LOS: 21 days A FACE TO FACE EVALUATION WAS PERFORMED  Acie Custis Lorie Phenix 04/22/2020, 10:26 AM

## 2020-04-22 NOTE — Progress Notes (Signed)
Patient received discharge instruction and ready to go. Leaving at about 1000 this am. Alert and oriented per usual.

## 2020-04-27 ENCOUNTER — Telehealth: Payer: Self-pay

## 2020-04-27 NOTE — Progress Notes (Signed)
[facility] NOTE  Patient Care Team: Kerin Perna, NP as PCP - General (Internal Medicine)  CHIEF COMPLAINTS/PURPOSE OF CONSULTATION:  Newly diagnosed breast cancer  HISTORY OF PRESENTING ILLNESS:  Courtney Coleman 54 y.o. female is here because of a history of left breast cancer. Patient palpated a left breast mass in 07/2019 and underwent a mammogram in the [place], where she was told it had a 10% chance of malignancy. Upon presentation to the [facility], she underwent a mammogram and [place] on 01/03/2020 that showed an 8.0cm focal distortion and calcifications in the left breast at the 2 o'clock position, and 3 abnormal axillary lymph nodes. 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 She was previously evaluated in [place]. She was admitted to [facility] from 04/01/20-04/21/20 for a thalamic stroke. She experienced speech difficulty, mild confusion, and difficulty walking and presented to the ED. She was switched from Xarelto to Eliquis. She presents to the clinic today for initial evaluation.  She plans to receive her entire treatment in [place] with the help of her niece.  I reviewed her records extensively and collaborated the history with the patient.  SUMMARY OF ONCOLOGIC HISTORY: Oncology History  Malignant neoplasm of central portion of left breast in female, estrogen receptor positive ([place])   Initial Diagnosis   Palpable left breast mass started February 2021 (patient lives in the [place]), went to [place] and mammogram and ultrasound 01/03/2020: 8 cm distortion and calcifications left breast 2:00 and 3 abnormal axillary lymph nodes.  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 Breast cancer ([place])   . CHF (congestive  heart failure) ([place])   . Dysrhythmia   . Hypertension     SURGICAL HISTORY: Past Surgical History:  Procedure Laterality Date  . BREAST BIOPSY      SOCIAL HISTORY: Social History   Socioeconomic History  . Marital status: Single    Spouse name: Not on file  . Number of children: Not on file  . Years of education: Not on file  . Highest education level: Not on file  Occupational History  . Not on file  Tobacco Use  . Smoking status: Never Smoker  . Smokeless tobacco: Never Used  Substance and Sexual Activity  . Alcohol use: Not Currently  . Drug use: Never  . Sexual activity: Not on file  Other Topics Concern  . Not on file  Social History Narrative  . Not on file   Social Determinants of Health   Financial Resource Strain:   . Difficulty of Paying Living Expenses: Not on file  Food Insecurity:   . Worried About Charity fundraiser in the Last Year: Not on file  . Ran Out of Food in the Last Year: Not on file  Transportation Needs:   . Lack of Transportation (Medical): Not on file  . Lack of Transportation (Non-Medical): Not on file  Physical Activity:   . Days of Exercise per Week: Not on file  . Minutes of Exercise per Session: Not on file  Stress:   . Feeling of Stress : Not on file  Social Connections:   . Frequency of Communication with Friends and Family: Not on file  . Frequency of Social Gatherings with Friends  and Family: Not on file  . Attends Religious Services: Not on file  . Active Member of Clubs or Organizations: Not on file  . Attends Archivist Meetings: Not on file  . Marital Status: Not on file  Intimate Partner Violence:   . Fear of Current or Ex-Partner: Not on file  . Emotionally Abused: Not on file  . Physically Abused: Not on file  . Sexually Abused: Not on file    FAMILY HISTORY: Family History  Problem Relation Age of Onset  . Heart disease Mother   . Diabetes Father   . Cancer Neg Hx     ALLERGIES:  has No  Known Allergies.  MEDICATIONS:  Current Outpatient Medications  Medication Sig Dispense Refill  . amiodarone (PACERONE) 100 MG tablet Take 1 tablet (100 mg total) by mouth daily. 30 tablet 0  . amLODipine (NORVASC) 5 MG tablet Take 1.5 tablets (7.5 mg total) by mouth daily. 45 tablet 0  . anastrozole (ARIMIDEX) 1 MG tablet Take 1 tablet (1 mg total) by mouth daily. 30 tablet 0  . apixaban (ELIQUIS) 5 MG TABS tablet Take 1 tablet (5 mg total) by mouth 2 (two) times daily. 60 tablet 0  . atorvastatin (LIPITOR) 40 MG tablet Take 1 tablet (40 mg total) by mouth daily. 30 tablet 0  . hydrALAZINE (APRESOLINE) 10 MG tablet Take 1 tablet (10 mg total) by mouth every 8 (eight) hours. 90 tablet 0  . polyethylene glycol (MIRALAX / GLYCOLAX) 17 g packet Take 17 g by mouth 2 (two) times daily. 60 each 0  . sacubitril-valsartan (ENTRESTO) 49-51 MG Take 1 tablet by mouth 2 (two) times daily. 60 tablet 0   No current facility-administered medications for this visit.    REVIEW OF SYSTEMS:   Constitutional: Denies fevers, chills or abnormal night sweats Eyes: Denies blurriness of vision, double vision or watery eyes Ears, nose, mouth, throat, and face: Denies mucositis or sore throat Respiratory: Denies cough, dyspnea or wheezes Cardiovascular: Denies palpitation, chest discomfort or lower extremity swelling Gastrointestinal:  Denies nausea, heartburn or change in bowel habits Skin: Denies abnormal skin rashes Lymphatics: Denies new lymphadenopathy or easy bruising Neurological:Denies numbness, tingling or new weaknesses Behavioral/Psych: Mood is stable, no new changes  Breast: Palpable mass in the left breast All other systems were reviewed with the patient and are negative.  PHYSICAL EXAMINATION: ECOG PERFORMANCE STATUS: 1 - Symptomatic but completely ambulatory  Vitals:   04/28/20 1245  BP: (!) 161/70  Pulse: 68  Resp: 18  Temp: (!) 97.5 F (36.4 C)  SpO2: 100%   Filed Weights   04/28/20  1245  Weight: 241 lb 1.6 oz (109.4 kg)    GENERAL:alert, no distress and comfortable SKIN: skin color, texture, turgor are normal, no rashes or significant lesions EYES: normal, conjunctiva are pink and non-injected, sclera clear OROPHARYNX:no exudate, no erythema and lips, buccal mucosa, and tongue normal  NECK: supple, thyroid normal size, non-tender, without nodularity LYMPH:  no palpable lymphadenopathy in the cervical, axillary or inguinal LUNGS: clear to auscultation and percussion with normal breathing effort HEART: regular rate & rhythm and no murmurs and no lower extremity edema ABDOMEN:abdomen soft, non-tender and normal bowel sounds Musculoskeletal:no cyanosis of digits and no clubbing  PSYCH: alert & oriented x 3 with fluent speech NEURO: no focal motor/sensory deficits BREAST: Palpable mass in the left breast (exam performed in the presence of a chaperone)   LABORATORY DATA:  I have reviewed the data as listed Lab Results  Component Value Date   WBC 5.7 04/20/2020   HGB 12.1 04/20/2020   HCT 39.3 04/20/2020   MCV 83.6 04/20/2020   PLT 258 04/20/2020   Lab Results  Component Value Date   NA 138 04/20/2020   K 3.5 04/20/2020   CL 104 04/20/2020   CO2 26 04/20/2020    RADIOGRAPHIC STUDIES: I have personally reviewed the radiological reports and agreed with the findings in the report.  ASSESSMENT AND PLAN:  Malignant neoplasm of central portion of left breast in female, estrogen receptor positive ([place]) Palpable left breast mass started February 2021 (patient lives in the [place]), went to [place] and mammogram and ultrasound 01/03/2020: 8 cm distortion and calcifications left breast 2:00 and 3 abnormal axillary lymph nodes.  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  Pathology and radiology counseling: Discussed with the patient, the details of pathology including the type of breast cancer,the clinical staging, the significance of ER,  PR and HER-2/neu receptors and the implications for treatment. After reviewing the pathology in detail, we proceeded to discuss the different treatment options between surgery, radiation, chemotherapy, antiestrogen therapies.  Recommendation based on multidisciplinary tumor board: 1. Neoadjuvant chemotherapy with TCH Perjeta 6 cycles followed by Herceptin Perjeta versus Kadcyla maintenance for 1 year 2. Followed by mastectomy with the targeted node dissection 3. Followed by adjuvant radiation therapy  4.  Followed by antiestrogen therapy and neratinib  Chemotherapy Counseling: I discussed the risks and benefits of chemotherapy including the risks of nausea/ vomiting, risk of infection from low WBC count, fatigue due to chemo or anemia, bruising or bleeding due to low platelets, mouth sores, loss/ change in taste and decreased appetite. Liver and kidney function will be monitored through out chemotherapy as abnormalities in liver and kidney function may be a side effect of treatment. Cardiac dysfunction due to Herceptin and Perjeta were discussed in detail. Risk of permanent bone marrow dysfunction due to chemo were also discussed.  Plan: 1. Port placement 2. Echocardiogram 3. Chemotherapy class 4. Breast MRI 5. CT chest abdomen pelvis  for staging 6.  Consult social work to help her since she does not have any health insurance. 7.  Counseled about participation in the nausea clinical trial  URCC 16070: Treatment of refractory nausea.  After first cycle of chemo if patient experience chemo induced nausea and vomiting the randomized from cycle 2 to Aloxi plus Dex plus olanzapine or placebo plus Compazine or placebo plus placebo prior to chemo and take home medications for day 2 today for of Dex plus olanzapine or placebo and Compazine or placebo every 8 hours.  If patient does not have nausea after cycle 1, then the trial is complete.  Return to clinic to start chemotherapy.     All  questions were answered. The patient knows to call the clinic with any problems, questions or concerns.   Rulon Eisenmenger, MD MPH 04/28/2020    I, Molly Dorshimer, am acting as scribe for Nicholas Lose, MD.  I have reviewed the above documentation for accuracy and completeness, and I agree with the above.

## 2020-04-27 NOTE — Telephone Encounter (Signed)
Transitional Care Call--who you spoke with patient Courtney Coleman   1. Are you/is patient experiencing any problems since coming home? No.  Are there any questions regarding any aspect of care? No. 2. Are there any questions regarding medications administration/dosing?No.  Are meds being taken as prescribed? Yes.  Patient should review meds with caller to confirm. Done 3. Have there been any falls? None. 4. Has Home Health been to the house and/or have they contacted you? Yes. Excellent care.  If not, have you tried to contact them? No problems. Can we help you contact them? N/A. 5. Are bowels and bladder emptying properly? Yes. Are there any unexpected incontinence issues? None. If applicable, is patient following bowel/bladder programs? N/A. 6. Any fevers, problems with breathing, unexpected pain? No. 7. Are there any skin problems or new areas of breakdown? None. 8. Has the patient/family member arranged specialty MD follow up (ie cardiology/neurology/renal/surgical/etc)? Appointments and phone contacts given.  Can we help arrange? No need.  9. Does the patient need any other services or support that we can help arrange? No. Patient advised to call back with any issues.  10. Are caregivers following through as expected in assisting the patient? Yes.         11. Has the patient quit smoking, drinking alcohol, or using drugs as recommended? Per patient she does not do any of the activities.   Appointment Date/Time/ Arrival time/ and who they are seeing [place]

## 2020-04-28 ENCOUNTER — Inpatient Hospital Stay
Payer: No Typology Code available for payment source | Attending: Hematology and Oncology | Admitting: Hematology and Oncology

## 2020-04-28 ENCOUNTER — Other Ambulatory Visit: Payer: Self-pay | Admitting: Hematology and Oncology

## 2020-04-28 ENCOUNTER — Encounter: Payer: Self-pay | Admitting: Medical Oncology

## 2020-04-28 ENCOUNTER — Other Ambulatory Visit: Payer: Self-pay

## 2020-04-28 ENCOUNTER — Encounter: Payer: Self-pay | Admitting: Hematology and Oncology

## 2020-04-28 ENCOUNTER — Encounter: Payer: Self-pay | Admitting: Licensed Clinical Social Worker

## 2020-04-28 VITALS — BP 161/70 | HR 68 | Temp 97.5°F | Resp 18 | Ht 66.0 in | Wt 241.1 lb

## 2020-04-28 DIAGNOSIS — Z7901 Long term (current) use of anticoagulants: Secondary | ICD-10-CM | POA: Diagnosis not present

## 2020-04-28 DIAGNOSIS — C50112 Malignant neoplasm of central portion of left female breast: Secondary | ICD-10-CM

## 2020-04-28 DIAGNOSIS — I639 Cerebral infarction, unspecified: Secondary | ICD-10-CM | POA: Diagnosis not present

## 2020-04-28 DIAGNOSIS — Z170 Estrogen receptor positive status [ER+]: Secondary | ICD-10-CM | POA: Insufficient documentation

## 2020-04-28 MED ORDER — ONDANSETRON HCL 8 MG PO TABS: 8.0000 mg | ORAL_TABLET | Freq: Two times a day (BID) | ORAL | 1 refills | Status: DC | PRN

## 2020-04-28 MED ORDER — PROCHLORPERAZINE MALEATE 10 MG PO TABS: 10.0000 mg | ORAL_TABLET | Freq: Four times a day (QID) | ORAL | 1 refills | Status: DC | PRN

## 2020-04-28 MED ORDER — LIDOCAINE-PRILOCAINE 2.5-2.5 % EX CREA: TOPICAL_CREAM | CUTANEOUS | 3 refills | Status: DC

## 2020-04-28 MED ORDER — DEXAMETHASONE 4 MG PO TABS: 4.0000 mg | ORAL_TABLET | Freq: Every day | ORAL | 0 refills | Status: DC

## 2020-04-28 MED FILL — ONDANSETRON HCL 8 MG TABLET: 8 | 15 days supply | Qty: 30 | Fill #0

## 2020-04-28 MED FILL — PROCHLORPERAZINE 10 MG TAB: 10 | 7 days supply | Qty: 30 | Fill #0

## 2020-04-28 MED FILL — DEXAMETHASONE 4 MG TABLET: 4 | 12 days supply | Qty: 12 | Fill #0

## 2020-04-28 NOTE — Progress Notes (Signed)
START ON PATHWAY REGIMEN - Breast     A cycle is every 21 days:     Pertuzumab      Pertuzumab      Trastuzumab-xxxx      Trastuzumab-xxxx      Carboplatin      Docetaxel   **Always confirm dose/schedule in your pharmacy ordering system**  Patient Characteristics: Preoperative or Nonsurgical Candidate (Clinical Staging), Neoadjuvant Therapy followed by Surgery, Invasive Disease, Chemotherapy, HER2 Positive, ER Positive Therapeutic Status: Preoperative or Nonsurgical Candidate (Clinical Staging) AJCC M Category: cM0 AJCC Grade: G3 Breast Surgical Plan: Neoadjuvant Therapy followed by Surgery ER Status: Positive (+) AJCC 8 Stage Grouping: IIB HER2 Status: Positive (+) AJCC T Category: cT3 AJCC N Category: cN1 PR Status: Positive (+) Intent of Therapy: Curative Intent, Discussed with Patient

## 2020-04-28 NOTE — Progress Notes (Signed)
[id number]: Treatment of Refractory Nausea  REFERRAL  Dr. Lindi Adie referred patient to study. [place] and I met with patient this afternoon, who is here with her niece, after her scheduled appointment with Dr. Lindi Adie. Patient confirms that MD had given her a brief explanation of what this study is about and the patient expressed interest in learning more. Patient was informed of some of the study involvement such as the medication, questionnaires and blood sample collection. Patient is also aware that participation is voluntary. Patient was encouraged to ask questions, in which she denied having any questions at this time. I provided patient with the study consent and authorization forms for her to review, as well as my contact information for any questions she may have. I informed patient that there is an eligibility requirement for this study and that while she is reading over the information I provided her today, I will be checking for eligibility. I also informed patient that I will contact her before she is seen in clinic for chemo education, to see if she has any question. Patient and niece were both thanked for their time and interest in study and encouraged them to call Dr. Lindi Adie or myself with questions.  Maxwell Marion, RN, BSN, [facility] 04/28/2020 1:58 PM

## 2020-04-28 NOTE — Progress Notes (Signed)
Called uninsured patient referred by RN to address financial concerns.  Introduced myself as Arboriculturist and explained the uninsured process. All uninsured patients automatically receive a 56% discount for services billed through [facility]. Patient must be found ineligible for Medicaid in order to apply for any discount beyond the 56% by completing a CAFA application and providing supporting documentation for Customer Service. Advised for any imaging services, surgeries; etc., once scheduled, a member from the Pre-Service center will contact her to discuss payments/options for those particular services. Patient verbalized understanding.  Patient states she is not a [place] citizen, which would affect Medicaid eligibility. This would be discussed by hospital counselors when they reach out regarding her recent hospital visit.     Discussed one-time $1000 Radio broadcast assistant to assist with personal expenses while going through treatment. Advised what is needed to apply. She will bring when she has chemo ed class.  She has my name and contact number for any additional financial questions or concerns.

## 2020-04-28 NOTE — Progress Notes (Signed)
[place] Work  Clinical Social Work was referred by Therapist, sports for assessment of psychosocial needs.  Clinical Social Worker contacted patient by phone  to offer support and assess for needs.   Patient without insurance- has already been called by [employer] to discuss process (see note). CSW offered information on other assistance programs through cancer foundations. Pt requested information be e-mailed, CSW confirmed e-mail and sent information.  No other questions or needs from patient at this time.     [place], [place] Worker [employer]

## 2020-04-28 NOTE — Assessment & Plan Note (Addendum)
Palpable left breast mass started February 2021 (patient lives in the [place]), went to [facility] and mammogram and ultrasound 01/03/2020: 8 cm distortion and calcifications left breast 2:00 and 3 abnormal axillary lymph nodes.  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  Pathology and radiology counseling: Discussed with the patient, the details of pathology including the type of breast cancer,the clinical staging, the significance of ER, PR and HER-2/neu receptors and the implications for treatment. After reviewing the pathology in detail, we proceeded to discuss the different treatment options between surgery, radiation, chemotherapy, antiestrogen therapies.  Recommendation based on multidisciplinary tumor board: 1. Neoadjuvant chemotherapy with TCH Perjeta 6 cycles followed by Herceptin Perjeta versus Kadcyla maintenance for 1 year 2. Followed by mastectomy with the targeted node dissection 3. Followed by adjuvant radiation therapy  4.  Followed by antiestrogen therapy and neratinib  Chemotherapy Counseling: I discussed the risks and benefits of chemotherapy including the risks of nausea/ vomiting, risk of infection from low WBC count, fatigue due to chemo or anemia, bruising or bleeding due to low platelets, mouth sores, loss/ change in taste and decreased appetite. Liver and kidney function will be monitored through out chemotherapy as abnormalities in liver and kidney function may be a side effect of treatment. Cardiac dysfunction due to Herceptin and Perjeta were discussed in detail. Risk of permanent bone marrow dysfunction due to chemo were also discussed.  Plan: 1. Port placement 2. Echocardiogram 3. Chemotherapy class 4. Breast MRI 5. CT chest abdomen pelvis  for staging 6.  Consult social work to help her since she does not have any health insurance. 7.  Counseled about participation in the nausea clinical trial  URCC 16070: Treatment of refractory  nausea.  After first cycle of chemo if patient experience chemo induced nausea and vomiting the randomized from cycle 2 to Aloxi plus Dex plus olanzapine or placebo plus Compazine or placebo plus placebo prior to chemo and take home medications for day 2 today for of Dex plus olanzapine or placebo and Compazine or placebo every 8 hours.  If patient does not have nausea after cycle 1, then the trial is complete.  Return to clinic to start chemotherapy.

## 2020-04-30 ENCOUNTER — Encounter: Payer: Self-pay | Admitting: *Deleted

## 2020-04-30 ENCOUNTER — Other Ambulatory Visit: Payer: Self-pay | Admitting: *Deleted

## 2020-04-30 ENCOUNTER — Telehealth: Payer: Self-pay | Admitting: *Deleted

## 2020-04-30 DIAGNOSIS — C50112 Malignant neoplasm of central portion of left female breast: Secondary | ICD-10-CM

## 2020-04-30 DIAGNOSIS — Z170 Estrogen receptor positive status [ER+]: Secondary | ICD-10-CM

## 2020-04-30 NOTE — Telephone Encounter (Signed)
Spoke to pt, provided navigation resources and contact information. Discussed coordination of care and next steps. Received verbal understanding. Denies questions or concerns regarding dx or treatment care plan. Encourage pt to call with needs. Received verbal understanding.

## 2020-05-01 ENCOUNTER — Telehealth: Payer: Self-pay | Admitting: Hematology and Oncology

## 2020-05-01 NOTE — Telephone Encounter (Signed)
Scheduled appt per 12/2 sch msg - pt is aware of appt date and time   

## 2020-05-01 NOTE — Telephone Encounter (Signed)
No 11/30 los, no changes made to pt schedule

## 2020-05-04 ENCOUNTER — Other Ambulatory Visit: Payer: Self-pay | Admitting: Radiology

## 2020-05-05 ENCOUNTER — Encounter
Payer: No Typology Code available for payment source | Attending: Physical Medicine & Rehabilitation | Admitting: Physical Medicine & Rehabilitation

## 2020-05-05 ENCOUNTER — Other Ambulatory Visit: Payer: Self-pay | Admitting: Radiology

## 2020-05-06 ENCOUNTER — Encounter (HOSPITAL_COMMUNITY): Payer: Self-pay

## 2020-05-06 ENCOUNTER — Ambulatory Visit (HOSPITAL_COMMUNITY)
Admission: RE | Admit: 2020-05-06 | Discharge: 2020-05-06 | Disposition: A | Discharge status: Home / Self Care | Payer: No Typology Code available for payment source | Source: Ambulatory Visit | Attending: Hematology and Oncology | Admitting: Hematology and Oncology

## 2020-05-06 ENCOUNTER — Other Ambulatory Visit: Payer: Self-pay | Admitting: Hematology and Oncology

## 2020-05-06 ENCOUNTER — Other Ambulatory Visit: Payer: Self-pay

## 2020-05-06 DIAGNOSIS — C50112 Malignant neoplasm of central portion of left female breast: Secondary | ICD-10-CM | POA: Insufficient documentation

## 2020-05-06 DIAGNOSIS — Z170 Estrogen receptor positive status [ER+]: Secondary | ICD-10-CM

## 2020-05-06 DIAGNOSIS — Z7901 Long term (current) use of anticoagulants: Secondary | ICD-10-CM | POA: Insufficient documentation

## 2020-05-06 DIAGNOSIS — Z79899 Other long term (current) drug therapy: Secondary | ICD-10-CM | POA: Insufficient documentation

## 2020-05-06 HISTORY — PX: IR IMAGING GUIDED PORT INSERTION: IMG5740

## 2020-05-06 LAB — CBC
HCT: 37.8 % (ref 36.0–46.0)
Hemoglobin: 11.8 g/dL — ABNORMAL LOW (ref 12.0–15.0)
MCH: 26.8 pg (ref 26.0–34.0)
MCHC: 31.2 g/dL (ref 30.0–36.0)
MCV: 85.9 fL (ref 80.0–100.0)
Platelets: 284 10*3/uL (ref 150–400)
RBC: 4.4 MIL/uL (ref 3.87–5.11)
RDW: 21.4 % — ABNORMAL HIGH (ref 11.5–15.5)
WBC: 8.2 10*3/uL (ref 4.0–10.5)
nRBC: 0 % (ref 0.0–0.2)

## 2020-05-06 LAB — PROTIME-INR
INR: 1.2 (ref 0.8–1.2)
Prothrombin Time: 14.6 seconds (ref 11.4–15.2)

## 2020-05-06 IMAGING — US IR IMAGING GUIDED PORT INSERTION
2 series · 2 of 2 positions shown · non-contrast
Comparison: None.

INDICATION: 53-year-old with left breast cancer. Port-A-Cath needed for
chemotherapy.

EXAM:
FLUOROSCOPIC AND ULTRASOUND GUIDED PLACEMENT OF A SUBCUTANEOUS PORT

[Series 1: (id) · 1 of 1 slices shown]
[im 1/1]
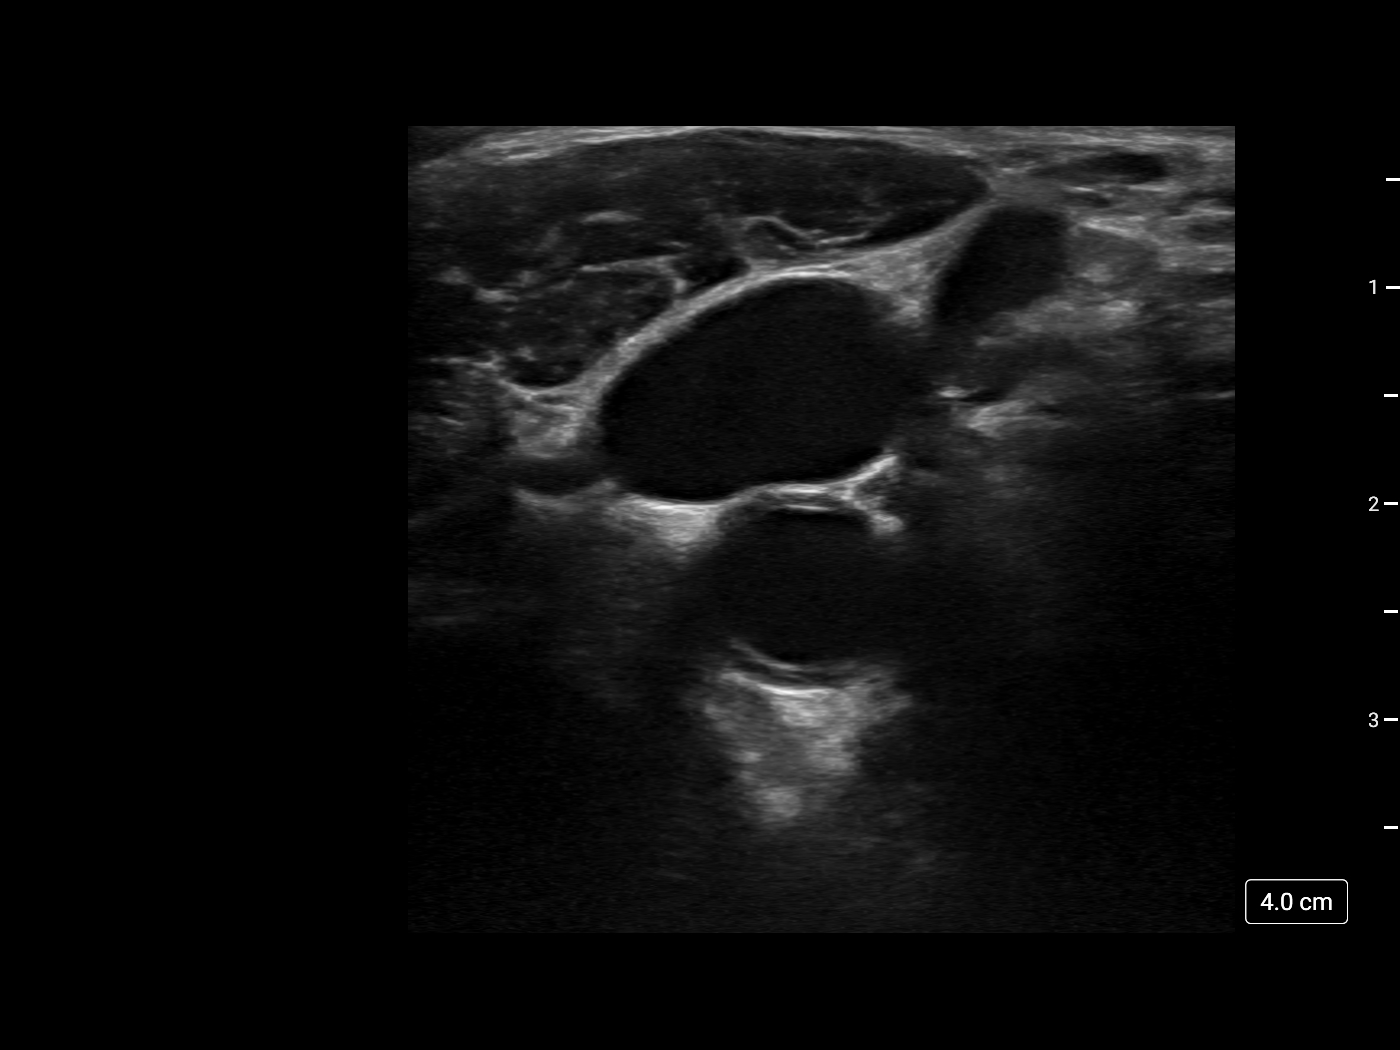

[Series 300: ir perc tun perit cath w/port s&i /imag · 1 of 1 slices shown]
[im 1/1]
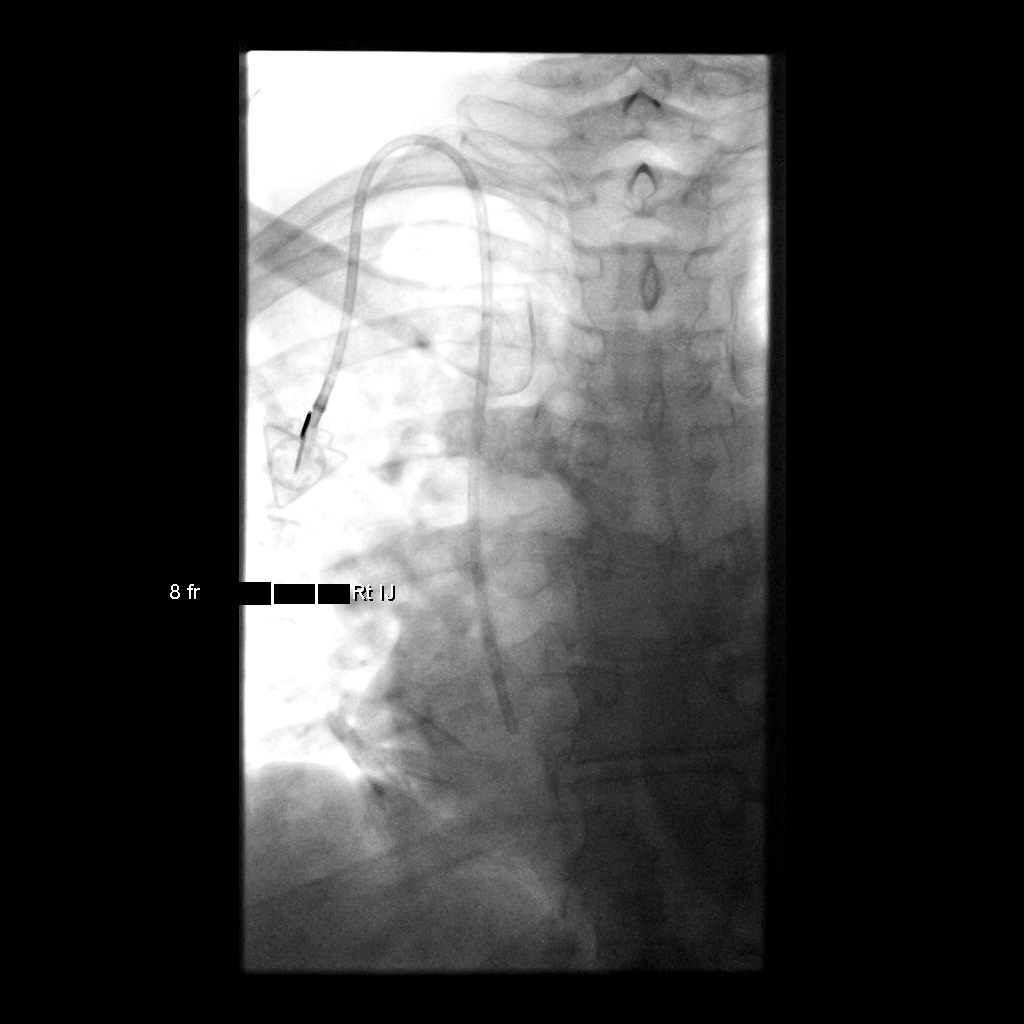

[2 of 2 positions shown; findings below may reference images not displayed]

MEDICATIONS:
Ancef 2 g; The antibiotic was administered within an appropriate
time interval prior to skin puncture.

ANESTHESIA/SEDATION:
Versed 2.0 mg IV; Fentanyl 100 mcg IV;

Moderate Sedation Time:  31 minutes

The patient was continuously monitored during the procedure by the
interventional radiology nurse under my direct supervision.

FLUOROSCOPY TIME:  18 seconds, 5 mGy

COMPLICATIONS:
None immediate.

PROCEDURE:
The procedure, risks, benefits, and alternatives were explained to
the patient. Questions regarding the procedure were encouraged and
answered. The patient understands and consents to the procedure.

Patient was placed supine on the interventional table. Ultrasound
confirmed a patent right internal jugular vein. Ultrasound image was
saved for documentation. The right chest and neck were cleaned with
a skin antiseptic and a sterile drape was placed. Maximal barrier
sterile technique was utilized including caps, mask, sterile gowns,
sterile gloves, sterile drape, hand hygiene and skin antiseptic. The
right neck was anesthetized with 1% lidocaine. Small incision was
made in the right neck with a blade. Micropuncture set was placed in
the right internal jugular vein with ultrasound guidance. The
micropuncture wire was used for measurement purposes. The right
chest was anesthetized with 1% lidocaine with epinephrine. #15 blade
was used to make an incision and a subcutaneous port pocket was
formed. 8 french Power Port was assembled. Subcutaneous tunnel was
formed with a stiff tunneling device. The port catheter was brought
through the subcutaneous tunnel. The port was placed in the
subcutaneous pocket and sutured in place. The micropuncture set was
exchanged for a peel-away sheath. The catheter was placed through
the peel-away sheath and the tip was positioned at the superior
cavoatrial junction. Catheter placement was confirmed with
fluoroscopy. The port was accessed and flushed with heparinized
saline. The port pocket was closed using two layers of absorbable
sutures and Dermabond. The vein skin site was closed using a single
layer of absorbable suture and Dermabond. Sterile dressings were
applied. Patient tolerated the procedure well without an immediate
complication. Ultrasound and fluoroscopic images were taken and
saved for this procedure.
IMPRESSION: Placement of a subcutaneous port device. Catheter tip at the
superior cavoatrial junction.

## 2020-05-06 MED ORDER — SODIUM CHLORIDE 0.9 % IV SOLN: INTRAVENOUS | Status: DC

## 2020-05-06 MED ORDER — HEPARIN SOD (PORK) LOCK FLUSH 100 UNIT/ML IV SOLN
INTRAVENOUS | Status: AC
  Filled 2020-05-06: qty 5

## 2020-05-06 MED ORDER — MIDAZOLAM HCL 2 MG/2ML IJ SOLN
INTRAMUSCULAR | Status: AC | PRN
  Administered 2020-05-06 (×2): 1 mg via INTRAVENOUS

## 2020-05-06 MED ORDER — LIDOCAINE-EPINEPHRINE 1 %-1:100000 IJ SOLN
INTRAMUSCULAR | Status: AC | PRN
  Administered 2020-05-06 (×2): 10 mL via INTRADERMAL

## 2020-05-06 MED ORDER — CEFAZOLIN SODIUM-DEXTROSE 2-4 GM/100ML-% IV SOLN: 2.0000 g | Freq: Once | INTRAVENOUS | Status: AC

## 2020-05-06 MED ORDER — HEPARIN SOD (PORK) LOCK FLUSH 100 UNIT/ML IV SOLN
INTRAVENOUS | Status: AC | PRN
  Administered 2020-05-06: 500 [IU] via INTRAVENOUS

## 2020-05-06 MED ORDER — MIDAZOLAM HCL 2 MG/2ML IJ SOLN
INTRAMUSCULAR | Status: AC
  Filled 2020-05-06: qty 4

## 2020-05-06 MED ORDER — CEFAZOLIN SODIUM-DEXTROSE 2-4 GM/100ML-% IV SOLN
INTRAVENOUS | Status: AC
  Administered 2020-05-06: 2 g via INTRAVENOUS
  Filled 2020-05-06: qty 100

## 2020-05-06 MED ORDER — LIDOCAINE-EPINEPHRINE 1 %-1:100000 IJ SOLN
INTRAMUSCULAR | Status: AC
  Filled 2020-05-06: qty 1

## 2020-05-06 MED ORDER — FENTANYL CITRATE (PF) 100 MCG/2ML IJ SOLN
INTRAMUSCULAR | Status: AC
  Filled 2020-05-06: qty 2

## 2020-05-06 MED ORDER — FENTANYL CITRATE (PF) 100 MCG/2ML IJ SOLN
INTRAMUSCULAR | Status: AC | PRN
Start: 2020-05-06 — End: 2020-05-06
  Administered 2020-05-06 (×2): 50 ug via INTRAVENOUS

## 2020-05-06 NOTE — H&P (Signed)
Chief Complaint: Patient was seen in consultation today for port-a-catheter placement  Referring Physician(s): Nicholas Lose  Supervising Physician: Markus Daft  Patient Status: [facility] - Out-pt  History of Present Illness: Courtney Coleman is a 54 y.o. female with a medical history significant for CHF, HTN and recently diagnosed left breast cancer. She palpated a mass February 2021 and underwent a mammogram in the [place], where she is from. 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 She now lives in [place] with family and has started chemotherapy.  Interventional Radiology has been asked to evaluate this patient for the placement of a port-a-catheter to facilitate her treatment plans.  Past Medical History:  Diagnosis Date  . Breast cancer ([place])   . CHF (congestive heart failure) ([place])   . Dysrhythmia   . Hypertension     Past Surgical History:  Procedure Laterality Date  . BREAST BIOPSY      Allergies: Patient has no known allergies.  Medications: Prior to Admission medications   Medication Sig Start Date End Date Taking? Authorizing Provider  amiodarone (PACERONE) 100 MG tablet Take 1 tablet (100 mg total) by mouth daily. 04/21/20   Love, Ivan Anchors, PA-C  amiodarone (PACERONE) 200 MG tablet Take 100 mg by mouth daily. 04/21/20   [provider]  amLODipine (NORVASC) 5 MG tablet Take 1.5 tablets (7.5 mg total) by mouth daily. 04/22/20   Love, Ivan Anchors, PA-C  anastrozole (ARIMIDEX) 1 MG tablet Take 1 tablet (1 mg total) by mouth daily. 04/21/20   Love, Ivan Anchors, PA-C  apixaban (ELIQUIS) 5 MG TABS tablet Take 1 tablet (5 mg total) by mouth 2 (two) times daily. 04/21/20   Love, Ivan Anchors, PA-C  atorvastatin (LIPITOR) 40 MG tablet Take 1 tablet (40 mg total) by mouth daily. 04/21/20   Love, Ivan Anchors, PA-C  dexamethasone (DECADRON) 4 MG tablet Take 1 tablet (4 mg total) by mouth daily. Okay take 1 tablet  day before chemo and 1 tablet day after chemo with food 04/28/20   Nicholas Lose, MD  hydrALAZINE (APRESOLINE) 10 MG tablet Take 1 tablet (10 mg total) by mouth every 8 (eight) hours. 04/21/20   Love, Ivan Anchors, PA-C  lidocaine-prilocaine (EMLA) cream Apply to affected area once 04/28/20   Nicholas Lose, MD  ondansetron (ZOFRAN) 8 MG tablet Take 1 tablet (8 mg total) by mouth 2 (two) times daily as needed (Nausea or vomiting). Start on the third day after chemotherapy. 04/28/20   Nicholas Lose, MD  polyethylene glycol (MIRALAX / GLYCOLAX) 17 g packet Take 17 g by mouth 2 (two) times daily. 04/21/20   Love, Ivan Anchors, PA-C  polyethylene glycol powder (GLYCOLAX/MIRALAX) 17 GM/SCOOP powder SMARTSIG:1 Capful(s) By Mouth Twice Daily 04/21/20   [provider]  prochlorperazine (COMPAZINE) 10 MG tablet Take 1 tablet (10 mg total) by mouth every 6 (six) hours as needed (Nausea or vomiting). 04/28/20   Nicholas Lose, MD  sacubitril-valsartan (ENTRESTO) 49-51 MG Take 1 tablet by mouth 2 (two) times daily. 04/21/20   Bary Leriche, PA-C     Family History  Problem Relation Age of Onset  . Heart disease Mother   . Diabetes Father   . Cancer Neg Hx     Social History   Socioeconomic History  . Marital status: Single    Spouse name: Not on file  . Number of children: Not on file  . Years of education: Not on file  .  Highest education level: Not on file  Occupational History  . Not on file  Tobacco Use  . Smoking status: Never Smoker  . Smokeless tobacco: Never Used  Substance and Sexual Activity  . Alcohol use: Not Currently  . Drug use: Never  . Sexual activity: Not on file  Other Topics Concern  . Not on file  Social History Narrative  . Not on file   Social Determinants of Health   Financial Resource Strain:   . Difficulty of Paying Living Expenses: Not on file  Food Insecurity:   . Worried About Charity fundraiser in the Last Year: Not on file  . Ran Out of Food in the  Last Year: Not on file  Transportation Needs:   . Lack of Transportation (Medical): Not on file  . Lack of Transportation (Non-Medical): Not on file  Physical Activity:   . Days of Exercise per Week: Not on file  . Minutes of Exercise per Session: Not on file  Stress:   . Feeling of Stress : Not on file  Social Connections:   . Frequency of Communication with Friends and Family: Not on file  . Frequency of Social Gatherings with Friends and Family: Not on file  . Attends Religious Services: Not on file  . Active Member of Clubs or Organizations: Not on file  . Attends Archivist Meetings: Not on file  . Marital Status: Not on file    Review of Systems: A 12 point ROS discussed and pertinent positives are indicated in the HPI above.  All other systems are negative.  Review of Systems  Constitutional: Negative for appetite change and fatigue.  Respiratory: Negative for cough and shortness of breath.   Cardiovascular: Negative for chest pain and leg swelling.  Gastrointestinal: Negative for abdominal pain, diarrhea, nausea and vomiting.  Musculoskeletal: Negative for back pain.  Neurological: Negative for dizziness and headaches.  Hematological: Does not bruise/bleed easily.    Vital Signs: BP (!) 176/100   Pulse 76   Temp 98.6 F (37 C) (Oral)   Resp 16   Ht 5\' 5"  (1.651 m)   Wt 246 lb (111.6 kg)   SpO2 99%   BMI 40.94 kg/m   Physical Exam Constitutional:      General: She is not in acute distress. HENT:     Mouth/Throat:     Mouth: Mucous membranes are moist.     Pharynx: Oropharynx is clear.  Cardiovascular:     Rate and Rhythm: Normal rate and regular rhythm.     Pulses: Normal pulses.     Heart sounds: Normal heart sounds.  Pulmonary:     Effort: Pulmonary effort is normal.     Breath sounds: Normal breath sounds.  Abdominal:     General: Bowel sounds are normal.     Palpations: Abdomen is soft.     Tenderness: There is no abdominal tenderness.   Musculoskeletal:        General: Normal range of motion.  Skin:    General: Skin is warm and dry.  Neurological:     Mental Status: She is alert and oriented to person, place, and time.  Psychiatric:        Mood and Affect: Mood normal.        Behavior: Behavior normal.        Thought Content: Thought content normal.        Judgment: Judgment normal.     Imaging: No results found.  Labs:  CBC: Recent Labs    04/06/20 0753 04/13/20 0616 04/20/20 0743 05/06/20 1315  WBC 6.0 5.8 5.7 8.2  HGB 11.0* 10.9* 12.1 11.8*  HCT 35.6* 35.2* 39.3 37.8  PLT 270 258 258 284    COAGS: Recent Labs    03/27/20 1831  INR 1.2  APTT 29    BMP: Recent Labs    04/02/20 0438 04/06/20 1106 04/13/20 0616 04/20/20 0743  NA 139 138 138 138  K 3.6 3.7 3.6 3.5  CL 106 103 106 104  CO2 23 26 24 26   GLUCOSE 114* 115* 101* 139*  BUN 12 12 12 13   CALCIUM 9.0 9.2 9.0 9.2  CREATININE 0.65 0.84 0.73 0.74  GFRNONAA >60 >60 >60 >60    LIVER FUNCTION TESTS: Recent Labs    03/27/20 1831 04/02/20 0438  BILITOT 0.9 0.7  AST 22 18  ALT 16 12  ALKPHOS 58 47  PROT 7.9 6.8  ALBUMIN 3.1* 2.7*    TUMOR MARKERS: No results for input(s): AFPTM, CEA, CA199, CHROMGRNA in the last 8760 hours.  Assessment and Plan:  Left Breast Cancer; chemotherapy: Courtney Coleman, 53 year old female, presents today to the [facility] department for an image-guided port-a-catheter placement.   Risks and benefits of image-guided port-a-catheter placement were discussed with the patient including, but not limited to bleeding, infection, pneumothorax, or fibrin sheath development and need for additional procedures.  All of the patient's questions were answered, patient is agreeable to proceed.  She has been NPO. Labs and vitals have been reviewed. Her last dose of Eliquis was this morning.   Consent signed and in chart.  Thank you for this interesting consult.  I greatly enjoyed  meeting Meghan Warshawsky and look forward to participating in their care.  A copy of this report was sent to the requesting provider on this date.  Electronically Signed: Soyla Dryer, AGACNP-BC [phone] 05/06/2020, 2:25 PM   I spent a total of  30 Minutes   in face to face in clinical consultation, greater than 50% of which was counseling/coordinating care for port-a-catheter placement.

## 2020-05-06 NOTE — Procedures (Signed)
Interventional Radiology Procedure:   Indications: Malignant neoplasm of central portion of left breast in female, estrogen receptor positive  Procedure: Port placement  Findings: Right jugular port, tip at SVC/RA junction  Complications: None     EBL: Minimal, less than 10 ml  Plan: Discharge in one hour.  Keep port site and incisions dry for at least 24 hours.     Chloe Baig R. Anselm Pancoast, MD  Pager: [phone]

## 2020-05-06 NOTE — Discharge Instructions (Signed)

## 2020-05-07 ENCOUNTER — Encounter: Payer: Self-pay | Admitting: Hematology and Oncology

## 2020-05-07 ENCOUNTER — Encounter: Payer: Self-pay | Admitting: Licensed Clinical Social Worker

## 2020-05-07 ENCOUNTER — Encounter: Payer: Self-pay | Admitting: Medical Oncology

## 2020-05-07 ENCOUNTER — Inpatient Hospital Stay: Payer: Self-pay | Attending: Hematology and Oncology

## 2020-05-07 ENCOUNTER — Encounter: Payer: Self-pay | Admitting: *Deleted

## 2020-05-07 ENCOUNTER — Inpatient Hospital Stay: Payer: Self-pay | Admitting: Medical Oncology

## 2020-05-07 ENCOUNTER — Other Ambulatory Visit: Payer: Self-pay

## 2020-05-07 ENCOUNTER — Telehealth: Payer: Self-pay | Admitting: Medical Oncology

## 2020-05-07 DIAGNOSIS — Z170 Estrogen receptor positive status [ER+]: Secondary | ICD-10-CM

## 2020-05-07 DIAGNOSIS — Z5112 Encounter for antineoplastic immunotherapy: Secondary | ICD-10-CM | POA: Insufficient documentation

## 2020-05-07 DIAGNOSIS — R197 Diarrhea, unspecified: Secondary | ICD-10-CM | POA: Insufficient documentation

## 2020-05-07 DIAGNOSIS — Z5111 Encounter for antineoplastic chemotherapy: Secondary | ICD-10-CM | POA: Insufficient documentation

## 2020-05-07 DIAGNOSIS — T451X5A Adverse effect of antineoplastic and immunosuppressive drugs, initial encounter: Secondary | ICD-10-CM | POA: Insufficient documentation

## 2020-05-07 DIAGNOSIS — C50112 Malignant neoplasm of central portion of left female breast: Secondary | ICD-10-CM

## 2020-05-07 DIAGNOSIS — Z5189 Encounter for other specified aftercare: Secondary | ICD-10-CM | POA: Insufficient documentation

## 2020-05-07 DIAGNOSIS — R110 Nausea: Secondary | ICD-10-CM | POA: Insufficient documentation

## 2020-05-07 DIAGNOSIS — Z79899 Other long term (current) drug therapy: Secondary | ICD-10-CM | POA: Insufficient documentation

## 2020-05-07 DIAGNOSIS — Z8673 Personal history of transient ischemic attack (TIA), and cerebral infarction without residual deficits: Secondary | ICD-10-CM | POA: Insufficient documentation

## 2020-05-07 DIAGNOSIS — D701 Agranulocytosis secondary to cancer chemotherapy: Secondary | ICD-10-CM | POA: Insufficient documentation

## 2020-05-07 DIAGNOSIS — Z7901 Long term (current) use of anticoagulants: Secondary | ICD-10-CM | POA: Insufficient documentation

## 2020-05-07 NOTE — Telephone Encounter (Signed)
[id number]: Treatment of Refractory Nausea  REFERRAL  Outgoing call: 05/07/2020 @ 10:30 Spoke with patient and inquired as to wether she had any questions regarding the study information I had provided her. Patient denies questions and states that she wishes to participate in study. Patient was asked if she is free to arrive to clinic 30 minutes before her scheduled chemo education class this afternoon and patient stated she was. I informed patient that I will schedule a research appointment at 3:30, patient confirms. Patient knows to arrive to clinic at 3:30 for review and consent of study this afternoon. Patient thanked for her time and was encouraged to call with questions in the meantime. Maxwell Marion, RN, BSN, [facility]

## 2020-05-07 NOTE — Progress Notes (Signed)
[id number]: Treatment of Refractory Nausea  REFERRAL  Study Consent Review:  I met with patient and patient's finance this afternoon for reviewing of the study consent and authorization forms, Protocol Version Date: 12/14/18. Eden Lathe, CRC was present during this review process, as well. Patient, fiance and I reviewed the study consent form page by page. Each section of the consent form was addressed and patient and fiance were provided with plenty of time for questions. Fiance had many questions regarding standard of care antiemetics and the side effects from those. I addressed each answer to the best of my ability and referred them to the list of side effects outlined in the consent form. Due to time constraints, patient and fiance were encouraged to wait until she has had her chemo education (this afternoon) and appointment with Dr. Pamelia Hoit (next week 05/15/20) before making a decision to participate in this study. This will provide patient and her fiance the opportunity to discuss any concerns with MD. As well as, patient and fiance can discuss the study more together in the next week, prior to patient's appointment with Dr. Pamelia Hoit. Patient and fiance agreed that this would be best. I thanked them both for their time and interest in study. Patient has the study consent and authorization forms as well as my contact information, and they were encouraged to call Dr. Pamelia Hoit or myself with any further questions/concerns.  Patient did confirm that she is not a women of child bearing potential and that her last menstrual period was approximately two years ago.  Approximately 40 minutes was spent this afternoon reviewing the study consent.  Rexene Edison, RN, BSN, [facility] 05/07/2020 4:23 PM

## 2020-05-07 NOTE — Progress Notes (Signed)
Met with patient at registration to discuss [employer].  Patient approved for one-time $1000 [employer] grant to assist with personal expenses while going through treatment. Discussed in detail expenses and how they are covered. She has a copy of the approval letter and expense sheet along with the Outpatient pharmacy information. She received a gas card today from her grant.  She has my card for any additional financial questions or concerns.

## 2020-05-07 NOTE — Progress Notes (Signed)
[facility] Work  CSW notified of transportation need for patient. Referral made to [facility] transportation department. Patient notified during chemo education appt.   Christeen Douglas, LCSW

## 2020-05-08 ENCOUNTER — Telehealth: Payer: Self-pay | Admitting: Primary Care

## 2020-05-08 NOTE — Telephone Encounter (Signed)
   Avnoor Koury DOB: 24-Oct-1965 MRN: [id number]   RIDER WAIVER AND RELEASE OF LIABILITY  For purposes of improving physical access to our facilities, [facility] is pleased to partner with third parties to provide [facility] patients or other authorized individuals the option of convenient, on-demand ground transportation services (the [place]") through use of the technology service that enables users to request on-demand ground transportation from independent third-party providers.  By opting to use and accept these [employer], I, the undersigned, hereby agree on behalf of myself, and on behalf of any minor child using the [employer] for whom I am the parent or legal guardian, as follows:  1. Government social research officer provided to me are provided by independent third-party transportation providers who are not [employer] or employees and who are unaffiliated with [employer]. 2. [facility] is neither a transportation carrier nor a common or public carrier. 3. [facility] has no control over the quality or safety of the transportation that occurs as a result of the [employer]. 4. [facility] cannot guarantee that any third-party transportation provider will complete any arranged transportation service. 5. [facility] makes no representation, warranty, or guarantee regarding the reliability, timeliness, quality, safety, suitability, or availability of any of the Transport Services or that they will be error free. 6. I fully understand that traveling by vehicle involves risks and dangers of serious bodily injury, including permanent disability, paralysis, and death. I agree, on behalf of myself and on behalf of any minor child using the Transport Services for whom I am the parent or legal guardian, that the entire risk arising out of my use of the [employer] remains solely with me, to the maximum extent permitted under applicable law. 7. The [employer] are provided "as is" and "as available." [facility] disclaims all representations and warranties, express, implied or statutory, not expressly set out in these terms, including the implied warranties of merchantability and fitness for a particular purpose. 8. I hereby waive and release [facility], its agents, employees, officers, directors, representatives, insurers, attorneys, assigns, successors, subsidiaries, and affiliates from any and all past, present, or future claims, demands, liabilities, actions, causes of action, or suits of any kind directly or indirectly arising from acceptance and use of the [employer]. 9. I further waive and release [facility] and its affiliates from all present and future liability and responsibility for any injury or death to persons or damages to property caused by or related to the use of the [employer]. 10. I have read this Waiver and Release of Liability, and I understand the terms used in it and their legal significance. This Waiver is freely and voluntarily given with the understanding that my right (as well as the right of any minor child for whom I am the parent or legal guardian using the [employer]) to legal recourse against [facility] in connection with the [employer] is knowingly surrendered in return for use of these services.   I attest that I read the consent document to Corena Herter, gave Ms. Lackey the opportunity to ask questions and answered the questions asked (if any). I affirm that Kenzee Bassin then provided consent for she's participation in this program.    Legrand Pitts

## 2020-05-11 ENCOUNTER — Encounter: Payer: Self-pay | Admitting: *Deleted

## 2020-05-11 NOTE — Progress Notes (Signed)
Pharmacist Chemotherapy Monitoring - Initial Assessment    Anticipated start date: 05/15/20  Regimen:  . Are orders appropriate based on the patient's diagnosis, regimen, and cycle? Yes . Does the plan date match the patient's scheduled date? Yes . Is the sequencing of drugs appropriate? Yes . Are the premedications appropriate for the patient's regimen? Yes . Prior Authorization for treatment is: Uninsured o If applicable, is the correct biosimilar selected based on the patient's insurance? yes  Organ Function and Labs: Marland Kitchen Are dose adjustments needed based on the patient's renal function, hepatic function, or hematologic function? Yes . Are appropriate labs ordered prior to the start of patient's treatment? Yes . Other organ system assessment, if indicated: trastuzumab: Echo/ MUGA . The following baseline labs, if indicated, have been ordered: N/A  Dose Assessment: . Are the drug doses appropriate? Yes . Are the following correct: o Drug concentrations Yes o IV fluid compatible with drug Yes o Administration routes Yes o Timing of therapy Yes . If applicable, does the patient have documented access for treatment and/or plans for port-a-cath placement? yes . If applicable, have lifetime cumulative doses been properly documented and assessed? not applicable Lifetime Dose Tracking  No doses have been documented on this patient for the following tracked chemicals: Doxorubicin, Epirubicin, Idarubicin, Daunorubicin, Mitoxantrone, Bleomycin, Oxaliplatin, Carboplatin, Liposomal Doxorubicin  o   Toxicity Monitoring/Prevention: . The patient has the following take home antiemetics prescribed: Prochlorperazine . The patient has the following take home medications prescribed: N/A . Medication allergies and previous infusion related reactions, if applicable, have been reviewed and addressed. Yes . The patient's current medication list has been assessed for drug-drug interactions with their  chemotherapy regimen. no significant drug-drug interactions were identified on review.  Order Review: . Are the treatment plan orders signed? Yes . Is the patient scheduled to see a provider prior to their treatment? Yes  I verify that I have reviewed each item in the above checklist and answered each question accordingly.  Courtney Coleman 05/11/2020 2:17 PM

## 2020-05-11 NOTE — Progress Notes (Signed)
..  The following Medication: Courtney Coleman is approved for drug replacement program by [employer]. The enrollment period is from 05/11/2020 to 05/11/2021.  Reason for Assistance: Self Pay.  First DOS:05/18/2020.

## 2020-05-12 NOTE — Progress Notes (Signed)
..  The following Medication: [place] is approved for drug replacement program by [place]. The enrollment period is from 05/12/2020 to 05/12/2021.  Reason for Assistance: Self Pay. ID: [id number] First DOS:05/15/2020.

## 2020-05-13 ENCOUNTER — Ambulatory Visit (HOSPITAL_COMMUNITY)
Admission: RE | Admit: 2020-05-13 | Discharge: 2020-05-13 | Disposition: A | Discharge status: Home / Self Care | Payer: Self-pay | Source: Ambulatory Visit | Attending: Hematology and Oncology | Admitting: Hematology and Oncology

## 2020-05-13 ENCOUNTER — Other Ambulatory Visit: Payer: Self-pay

## 2020-05-13 ENCOUNTER — Encounter (HOSPITAL_COMMUNITY): Payer: Self-pay

## 2020-05-13 ENCOUNTER — Ambulatory Visit
Admission: RE | Admit: 2020-05-13 | Discharge: 2020-05-13 | Disposition: A | Discharge status: Home / Self Care | Payer: Self-pay | Source: Ambulatory Visit | Attending: Hematology and Oncology | Admitting: Hematology and Oncology

## 2020-05-13 DIAGNOSIS — Z170 Estrogen receptor positive status [ER+]: Secondary | ICD-10-CM

## 2020-05-13 DIAGNOSIS — C50112 Malignant neoplasm of central portion of left female breast: Secondary | ICD-10-CM | POA: Insufficient documentation

## 2020-05-13 IMAGING — CT CT CHEST-ABD-PELV W/ CM
2 of 5 series · 13 of 36 positions shown, 15 images · IV contrast (APPLIED)
Comparison: None.

CLINICAL DATA: Breast cancer.  Staging.

EXAM:
CT CHEST, ABDOMEN, AND PELVIS WITH CONTRAST
TECHNIQUE: Multidetector CT imaging of the chest, abdomen and pelvis was
performed following the standard protocol during bolus
administration of intravenous contrast.
CONTRAST:  100mL OMNIPAQUE IOHEXOL 300 MG/ML  SOLN

[Series 2: cap with · axial · 0.70mm/px · z∈[-646,-112]mm · 10 of 129 slices shown, 12 images]
[im 11/129  mediastinal]
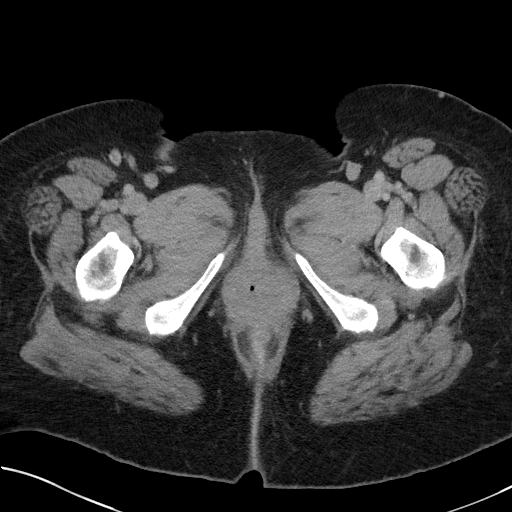
[im 11/129  bone]
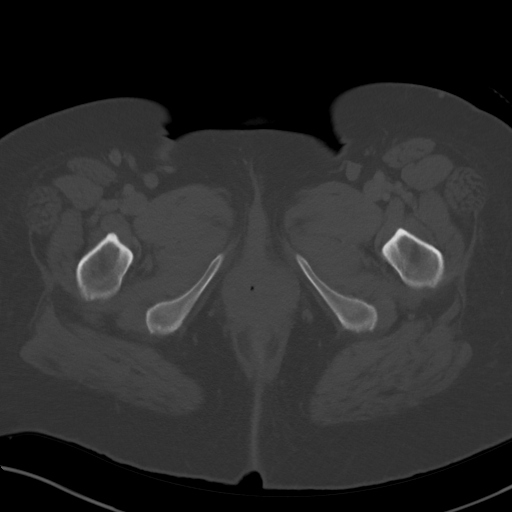
[im 22/129  mediastinal]
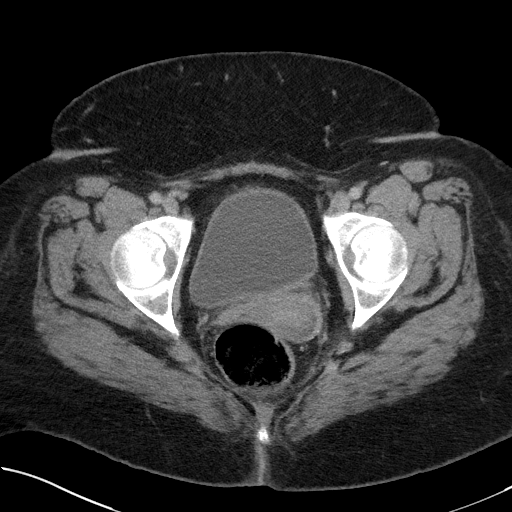
[im 33/129  mediastinal]
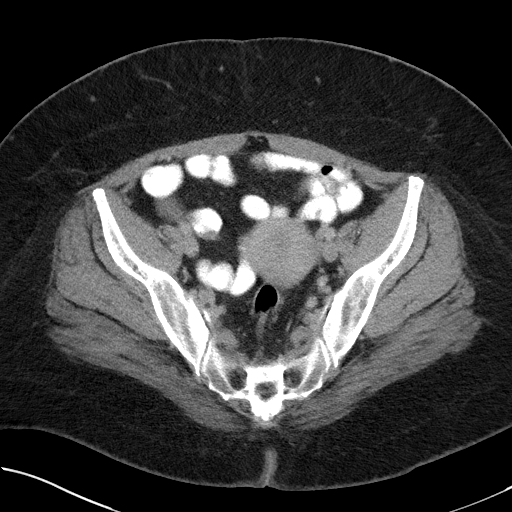
[im 43/129  mediastinal]
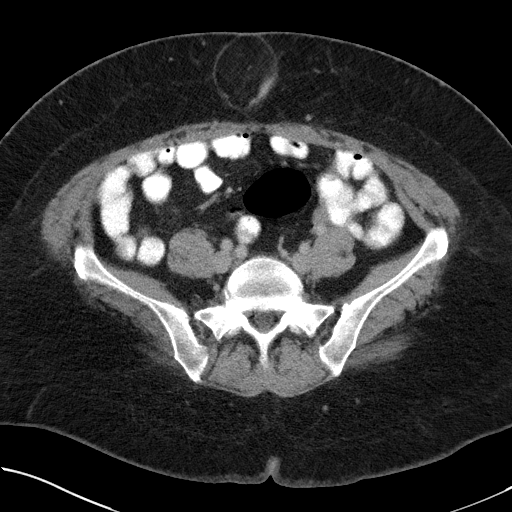
[im 54/129  mediastinal]
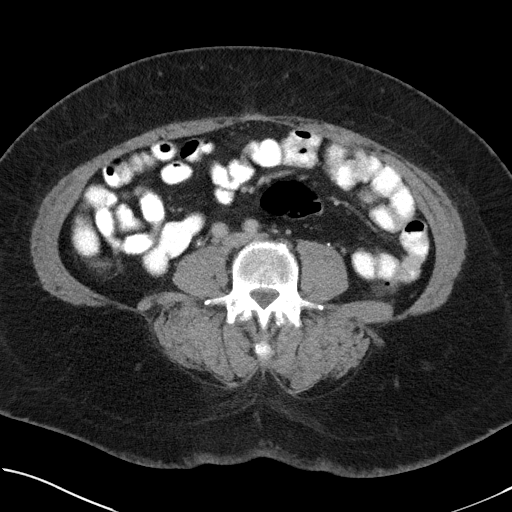
[im 75/129  mediastinal]
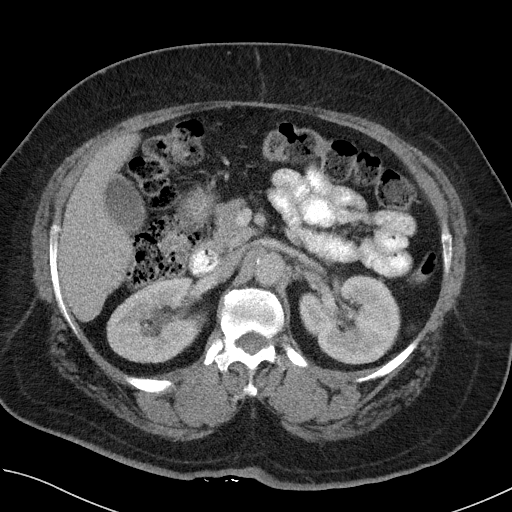
[im 86/129  mediastinal]
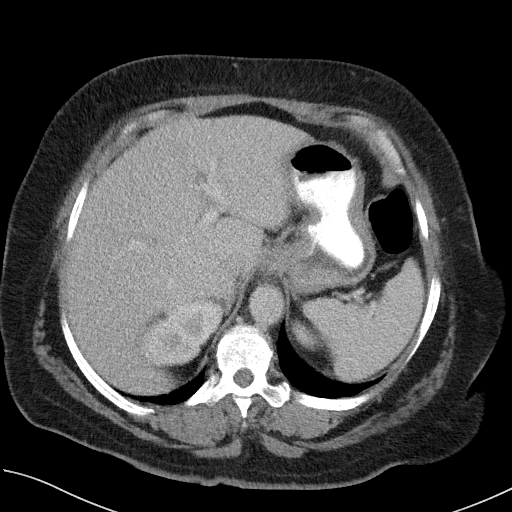
[im 97/129  mediastinal]
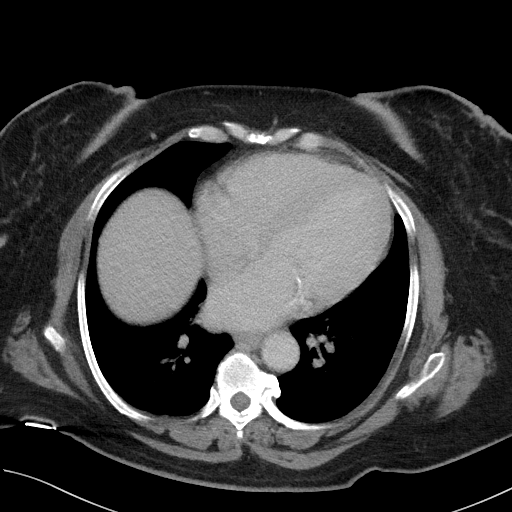
[im 107/129  mediastinal]
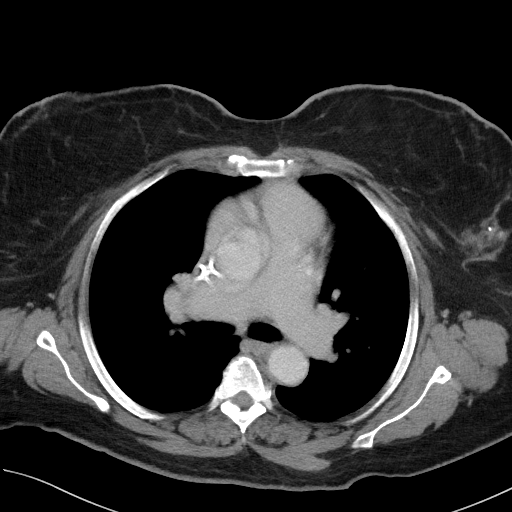
[im 107/129  bone]
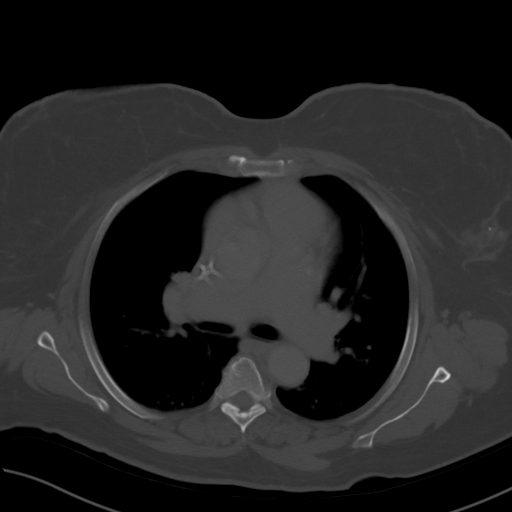
[im 118/129  mediastinal]
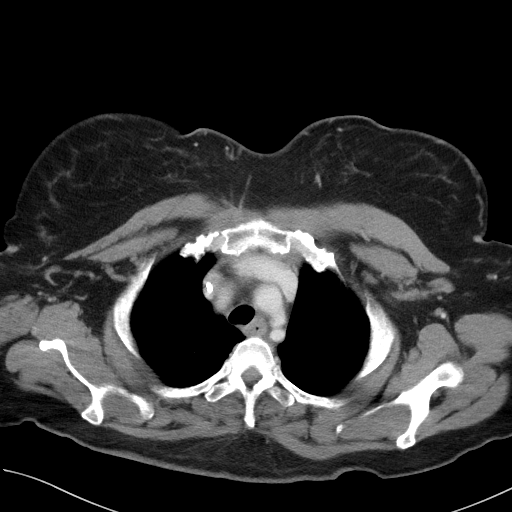

[Series 4: coronals · coronal · 0.80mm/px · 3 of 163 slices shown]
[im 33/163  mediastinal]
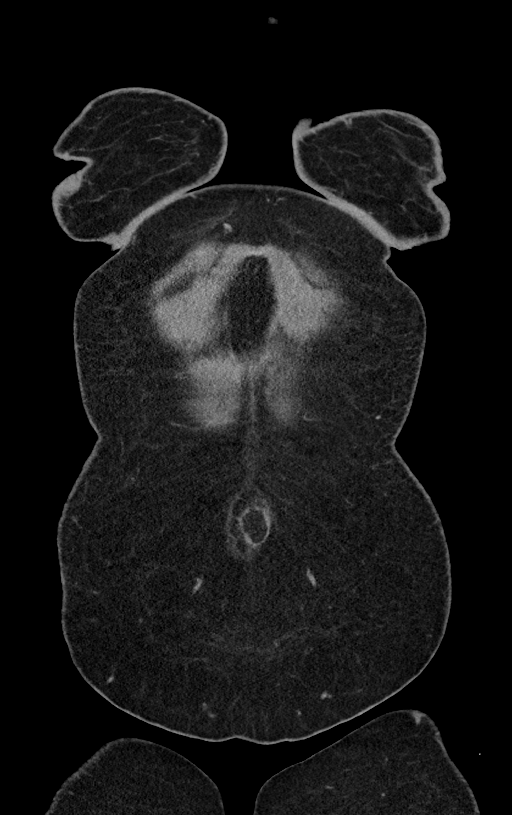
[im 65/163  mediastinal]
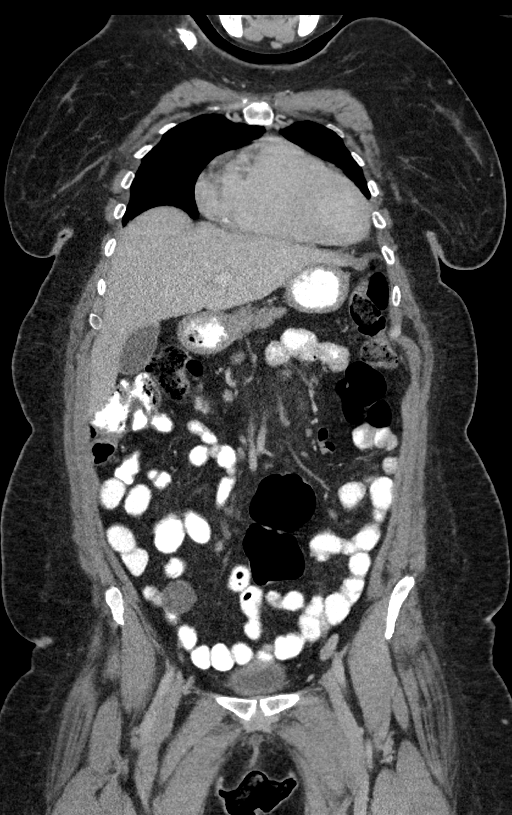
[im 98/163  mediastinal]
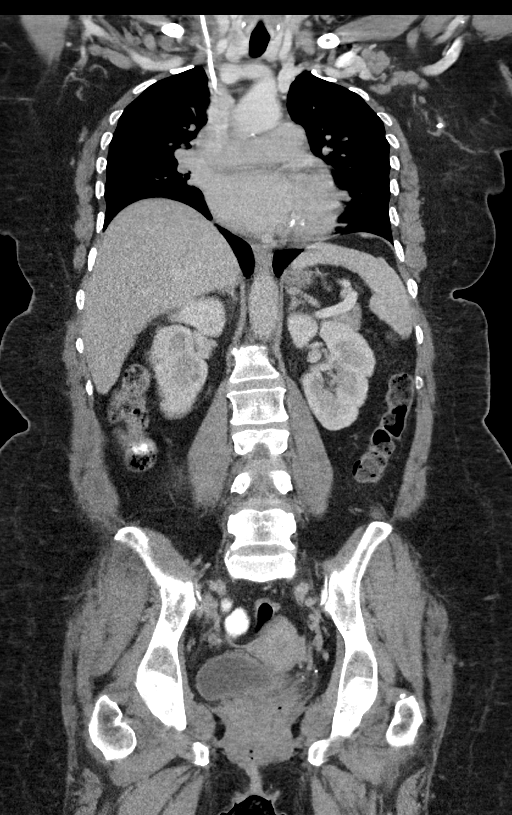

[13 of 36 positions shown; findings below may reference images not displayed]

FINDINGS: CT CHEST FINDINGS

Cardiovascular: The heart is enlarged. No substantial pericardial
effusion. Atherosclerotic calcification is noted in the wall of the
thoracic aorta. Right Port-A-Cath tip is positioned at the SVC/RA
junction.

Mediastinum/Nodes: 13 mm hypoattenuating right thyroid nodule. No
mediastinal lymphadenopathy. There is no hilar lymphadenopathy. The
esophagus has normal imaging features. There is no axillary
lymphadenopathy.

Lungs/Pleura: 3 mm ground-glass opacity identified anterior right
upper lobe on image 37/6. No suspicious pulmonary nodule or mass. No
focal airspace consolidation. No pleural effusion.

Musculoskeletal: No worrisome lytic or sclerotic osseous
abnormality.

CT ABDOMEN PELVIS FINDINGS

Hepatobiliary: No suspicious focal abnormality within the liver
parenchyma. Small area of low attenuation in the anterior liver,
adjacent to the falciform ligament, is in a characteristic location
for focal fatty deposition. There is no evidence for gallstones,
gallbladder wall thickening, or pericholecystic fluid. No
intrahepatic or extrahepatic biliary dilation.

Pancreas: No focal mass lesion. No dilatation of the main duct. No
intraparenchymal cyst. No peripancreatic edema.

Spleen: No splenomegaly. No focal mass lesion.

Adrenals/Urinary Tract: No adrenal nodule or mass. Right kidney
unremarkable. 18 mm low-density lesion interpolar left kidney has
attenuation higher than would be expected for a simple cyst but is
likely a cyst complicated by proteinaceous debris or hemorrhage. No
evidence for hydroureter. The urinary bladder appears normal for the
degree of distention.

Stomach/Bowel: Stomach is unremarkable. No gastric wall thickening.
No evidence of outlet obstruction. Duodenum is normally positioned
as is the ligament of Treitz. No small bowel wall thickening. No
small bowel dilatation. The terminal ileum is normal. The appendix
is not visualized, but there is no edema or inflammation in the
region of the cecum. No gross colonic mass. No colonic wall
thickening.

Vascular/Lymphatic: There is abdominal aortic atherosclerosis
without aneurysm. There is no gastrohepatic or hepatoduodenal
ligament lymphadenopathy. No retroperitoneal or mesenteric
lymphadenopathy. No pelvic sidewall lymphadenopathy.

Reproductive: The uterus is unremarkable. Incidental note of air in
fluid in the vagina. Left adnexal region unremarkable. 2.9 cm benign
appearing cyst identified in the right ovary.

Other: No intraperitoneal free fluid.

Musculoskeletal: No worrisome lytic or sclerotic osseous
abnormality.
IMPRESSION: 1. No evidence for metastatic disease in the chest, abdomen, or
pelvis.
2. 3 mm ground-glass opacity anterior right upper lobe, likely
benign. Attention on follow-up recommended.
3. 18 mm low-density lesion interpolar left kidney has attenuation
higher than would be expected for a simple cyst but is likely a cyst
complicated by proteinaceous debris or hemorrhage. Attention on
follow-up recommended to ensure stability.
4. 2.9 cm benign appearing cyst in the right ovary. No specific
imaging follow-up recommended. This recommendation follows ACR
consensus guidelines: White Paper of the ACR Incidental Findings
Committee II on Adnexal Findings. [HOSPITAL] [DATE].
5. 13 mm hypoattenuating right thyroid nodule. Not clinically
significant; no follow-up imaging recommended (ref: [HOSPITAL]. [DATE]): 143-50).
6. Aortic Atherosclerosis ([WX]-[WX]).

## 2020-05-13 IMAGING — MR MR BREAST BILAT WO/W CM
8 of 13 series · 31 of 48 positions shown · IV contrast (10 ml Gadavist)
Comparison: Previous exam(s).

CLINICAL DATA: Palpable left breast mass started [DATE]
(patient lives in the [place]), went to [REDACTED] [REDACTED] and
mammogram and ultrasound [DATE]: 8 cm distortion and
calcifications left breast [DATE] and 3 abnormal axillary lymph nodes.
Biopsy revealed grade 3 IDC ER 99%, PR 5%, [B3] positive.

Patient to begin chemotherapy tomorrow.
LABS:  No labs drawn at time of imaging.
EXAM:
BILATERAL BREAST MRI WITH AND WITHOUT CONTRAST
TECHNIQUE: Multiplanar, multisequence MR images of both breasts were obtained
prior to and following the intravenous administration of 10 ml of
Gadavist

[Series 3: t2_tirm_tra ipat (a-p) · axial · 3.0mm · 0.78mm/px · 1 of 55 slices shown]
[im 1/55]
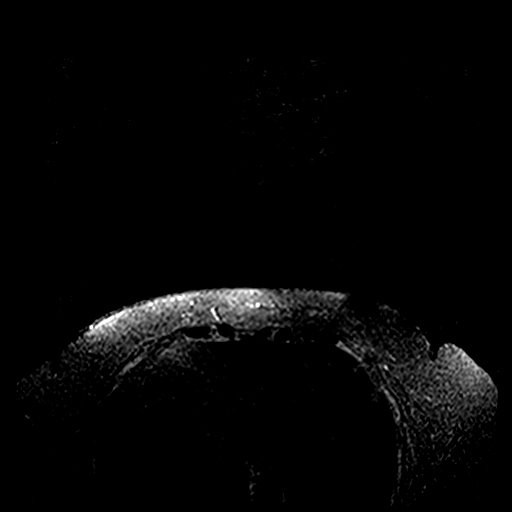

[Series 4: fl3d pre-cm no · axial · non-contrast · 1.2mm · 1.04mm/px · z∈[-73,+99]mm · 5 of 144 slices shown]
[im 1/144]
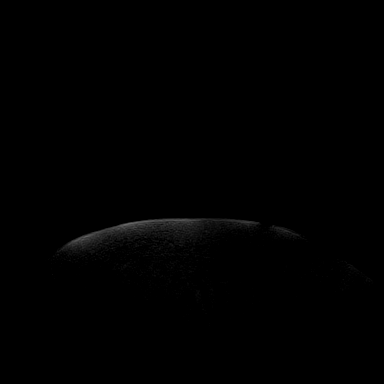
[im 36/144]
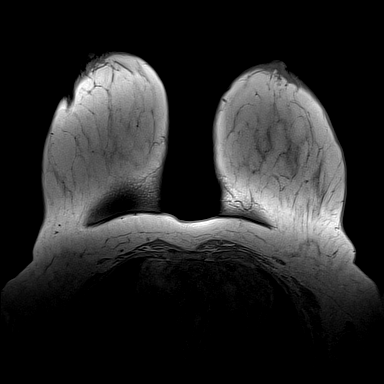
[im 72/144]
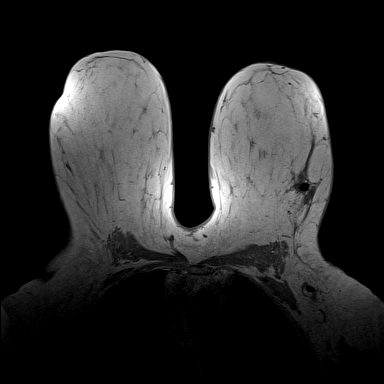
[im 108/144]
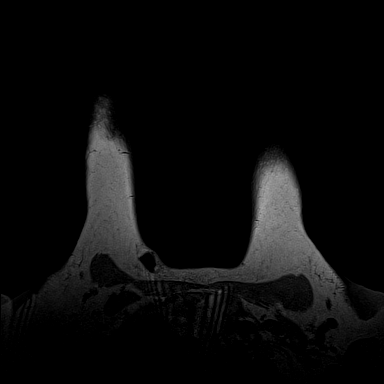
[im 144/144]
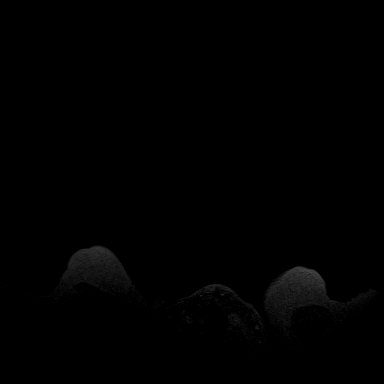

[Series 5: fl3d pre-cm · axial · non-contrast · 1.2mm · 1.04mm/px · z∈[-73,+99]mm · 5 of 144 slices shown]
[im 1/144]
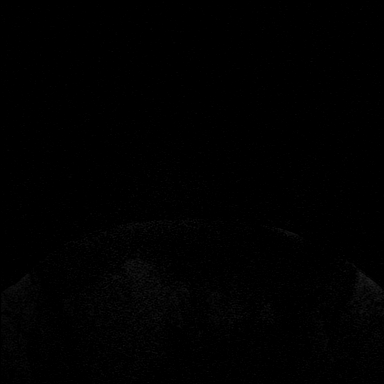
[im 36/144]
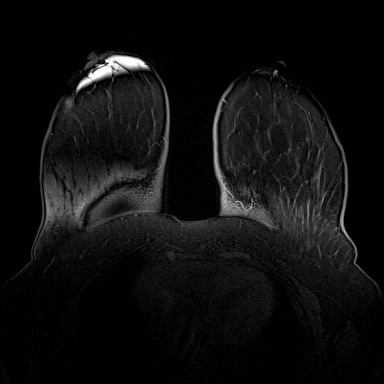
[im 72/144]
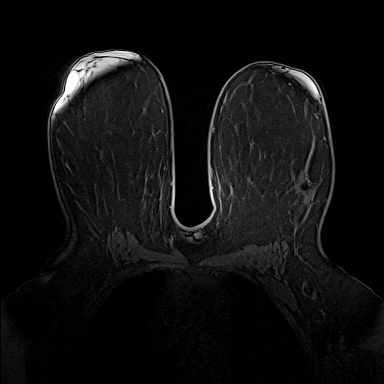
[im 108/144]
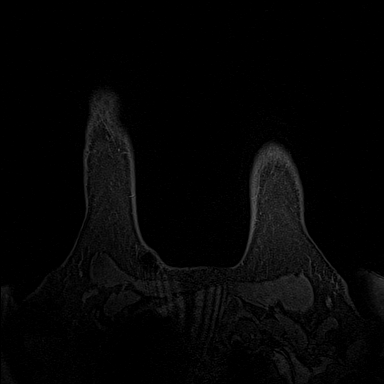
[im 144/144]
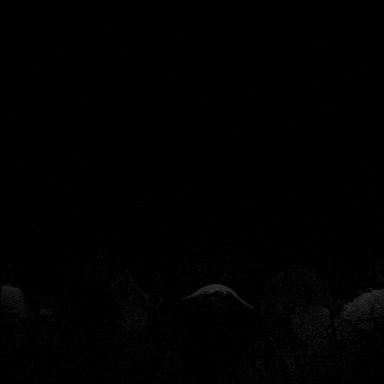

[Series 6: fl3d post-cm 20 · axial · 1.2mm · 1.04mm/px · z∈[-73,+99]mm · 5 of 144 slices shown (1 of 3)]
[im 1/144]
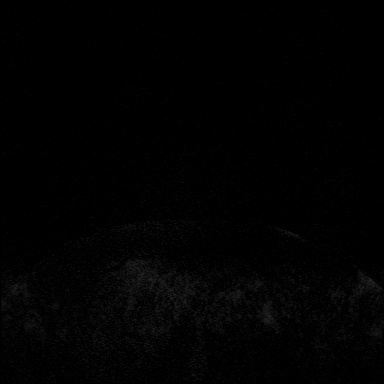
[im 36/144]
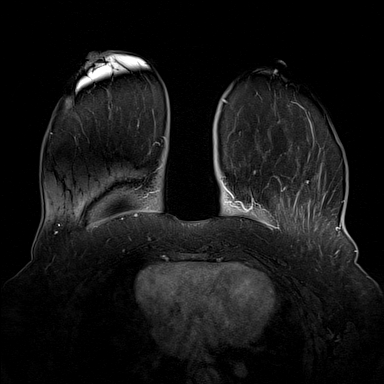
[im 72/144]
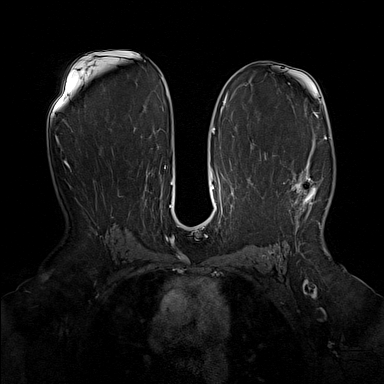
[im 108/144]
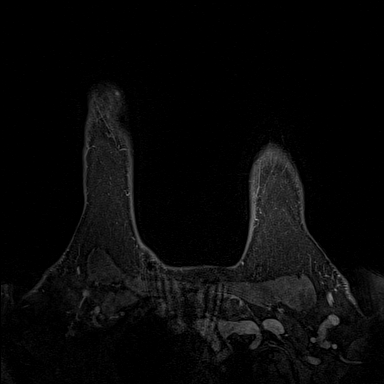
[im 144/144]
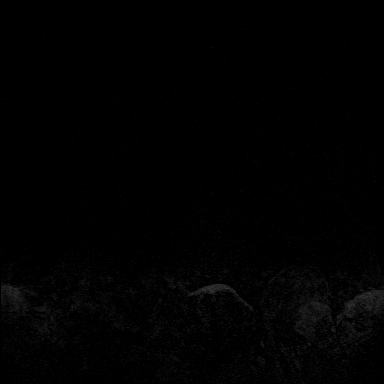

[Series 7: fl3d post-cm 20 · axial · 1.2mm · 1.04mm/px · z∈[-73,+99]mm · 5 of 144 slices shown (2 of 3)]
[im 1/144]
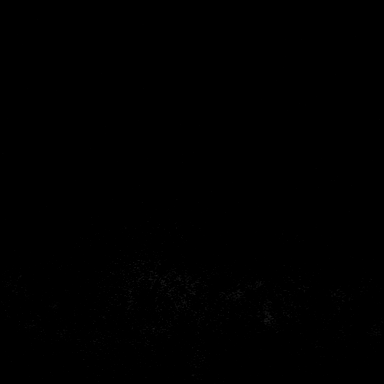
[im 36/144]
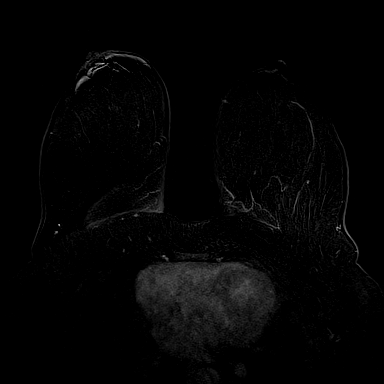
[im 72/144]
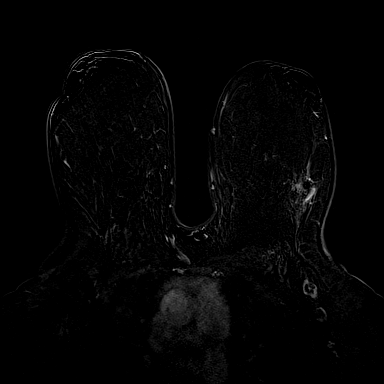
[im 108/144]
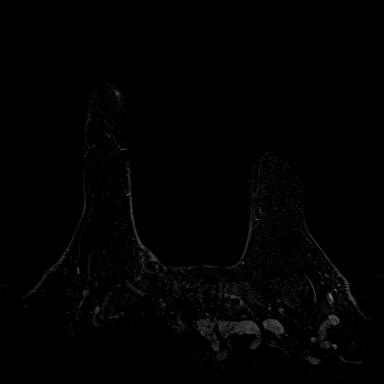
[im 144/144]
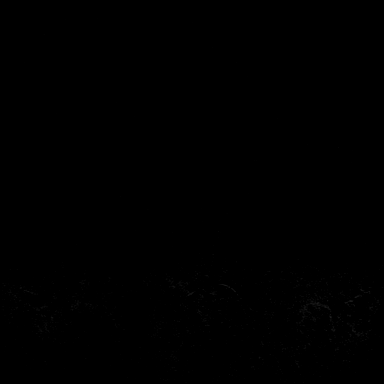

[Series 8: fl3d post-cm 20 · axial · 172.8mm · 1.04mm/px · 1 of 1 slices shown (3 of 3)]
[im 1/1]
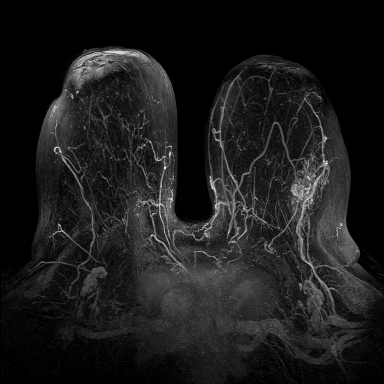

[Series 9: fl3d post-cm 3min · axial · 1.2mm · 1.04mm/px · z∈[-73,+99]mm · 5 of 144 slices shown]
[im 1/144]
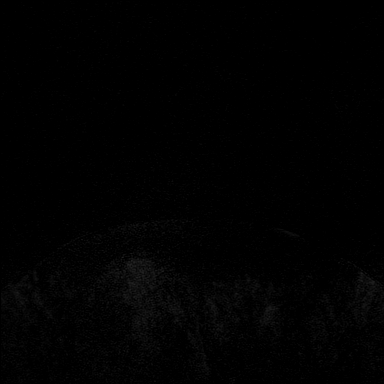
[im 36/144]
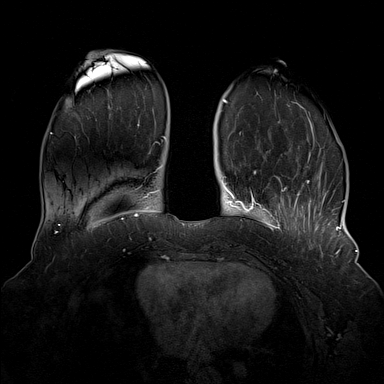
[im 72/144]
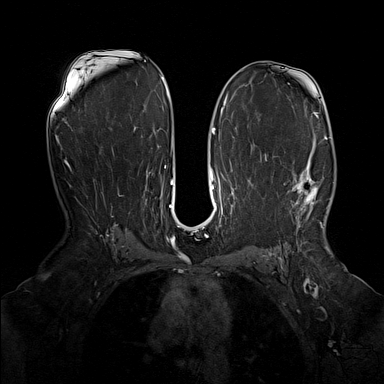
[im 108/144]
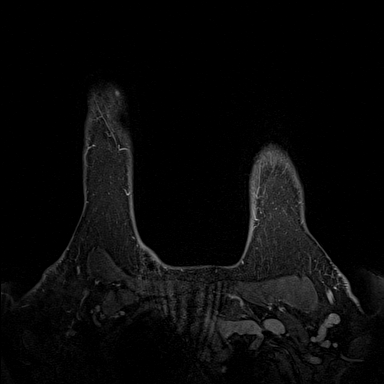
[im 144/144]
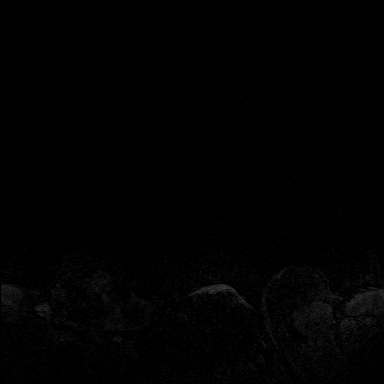

[Series 10: fl3d post-cm 3min_sub · axial · 1.2mm · 1.04mm/px · z∈[-73,+29]mm · 4 of 144 slices shown]
[im 1/144]
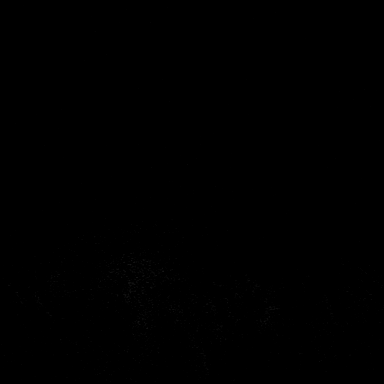
[im 29/144]
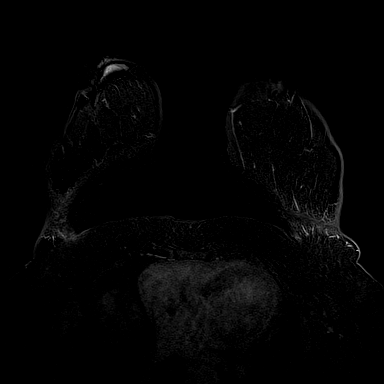
[im 58/144]
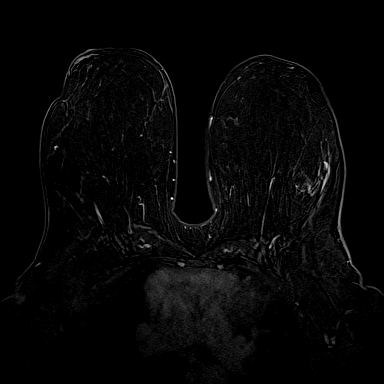
[im 86/144]
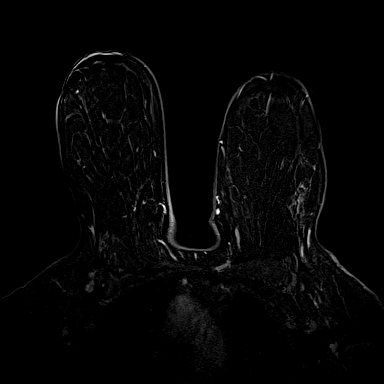

[31 of 48 positions shown; findings below may reference images not displayed]

Three-dimensional MR images were rendered by post-processing of the
original MR data on an independent workstation. The
three-dimensional MR images were interpreted, and findings are
reported in the following complete MRI report for this study. Three
dimensional images were evaluated at the independent interpreting
workstation using the DynaCAD thin client.
FINDINGS: Breast composition: b. Scattered fibroglandular tissue.

Background parenchymal enhancement: Minimal

Right breast: No mass or abnormal enhancement.

Left breast: There is an irregular mass with surrounding non mass
enhancement in the posterior, upper outer left breast, associated
with susceptibility artifact from the post biopsy marker clip. The
irregular mass component measures 2 cm, and the overall span of the
non mass enhancement and irregular mass is 6 x 2.7 x 3.7 cm. There
are no other areas of abnormal left breast enhancement.

Lymph nodes: Susceptibility artifact lies within a left axillary
lymph node with a prominent cortex, 3-4 mm in size. Superior to this
is a larger more abnormal appearing lymph node, cortex 1 cm in
thickness, short axis 1.5 cm the third abnormal lymph node noted on
the previous ultrasound is not defined on MRI. There are no abnormal
right axillary lymph nodes.

Ancillary findings: Port-A-Cath in the right upper inner
subcutaneous soft tissues.
IMPRESSION: 1. Irregular mass and associated non mass enhancement in the upper
outer left breast spans a total of 6 x 2.7 x 3.7 cm, representing
the recently diagnosed carcinoma.
2. No evidence of additional left breast malignancy.
3. No evidence of right breast malignancy.
4. 2 abnormal left axillary lymph nodes, the smaller representing
the recently biopsied lymph node. No abnormal right axillary lymph
nodes.

RECOMMENDATION:
1. Treatment as planned for the known left breast carcinoma and
metastatic left lymphadenopathy.

BI-RADS CATEGORY  6: Known biopsy-proven malignancy.

## 2020-05-13 MED ORDER — SODIUM CHLORIDE (PF) 0.9 % IJ SOLN
INTRAMUSCULAR | Status: AC
  Filled 2020-05-13: qty 50

## 2020-05-13 MED ORDER — GADOBUTROL 1 MMOL/ML IV SOLN
10.0000 mL | Freq: Once | INTRAVENOUS | Status: AC | PRN
  Administered 2020-05-13: 18:00:00 10 mL via INTRAVENOUS

## 2020-05-13 MED ORDER — IOHEXOL 300 MG/ML  SOLN
100.0000 mL | Freq: Once | INTRAMUSCULAR | Status: AC | PRN
  Administered 2020-05-13: 13:00:00 100 mL via INTRAVENOUS

## 2020-05-13 NOTE — Assessment & Plan Note (Signed)
Palpable left breast mass started February 2021 (patient lives in the [place]), went to [place] and mammogram and ultrasound 01/03/2020: 8 cm distortion and calcifications left breast 2:00 and 3 abnormal axillary lymph nodes.  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  Treatment Plan:: 1. Neoadjuvant chemotherapy with TCH Perjeta 6 cycles followed by Herceptin Perjeta versus Kadcyla maintenance for 1 year 2. Followed by mastectomy with the targeted node dissection 3. Followed by adjuvant radiation therapy  4.  Followed by antiestrogen therapy and neratinib -------------------------------------------------------------------------------------------------------------------------------- Current Treatment: Cycle 1 TCHP ECHO 03/28/20: EF 60-65% Labs reviewed, chemo consent obtained. Chemo education complete  RTC in 1 week for toxicity check

## 2020-05-13 NOTE — Progress Notes (Signed)
Patient Care Team: Kerin Perna, NP as PCP - General (Internal Medicine) Mauro Kaufmann, RN as Oncology Nurse Navigator Rockwell Germany, RN as Oncology Nurse Navigator  DIAGNOSIS:    ICD-10-CM   1. Malignant neoplasm of central portion of left breast in female, estrogen receptor positive ([place])  C50.112    Z17.0     SUMMARY OF ONCOLOGIC HISTORY: Oncology History  Malignant neoplasm of central portion of left breast in female, estrogen receptor positive ([place])   Initial Diagnosis   Palpable left breast mass started February 2021 (patient lives in the [place]), went to [place] and mammogram and ultrasound 01/03/2020: 8 cm distortion and calcifications left breast 2:00 and 3 abnormal axillary lymph nodes.  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   05/15/2020 -  Chemotherapy   The patient had dexamethasone (DECADRON) 4 MG tablet, 4 mg (100 % of original dose 4 mg), Oral, Daily, 1 of 1 cycle, Start date: 04/28/2020, End date: -- Dose modification: 4 mg (original dose 4 mg, Cycle 0) palonosetron (ALOXI) injection 0.25 mg, 0.25 mg, Intravenous,  Once, 0 of 6 cycles pegfilgrastim-cbqv (UDENYCA) injection 6 mg, 6 mg, Subcutaneous, Once, 0 of 6 cycles CARBOplatin (PARAPLATIN) 700 mg in sodium chloride 0.9 % 250 mL chemo infusion, 700 mg (100 % of original dose 700 mg), Intravenous,  Once, 0 of 6 cycles Dose modification: 700 mg (original dose 700 mg, Cycle 1) DOCEtaxel (TAXOTERE) 170 mg in sodium chloride 0.9 % 250 mL chemo infusion, 75 mg/m2 = 170 mg, Intravenous,  Once, 0 of 6 cycles fosaprepitant (EMEND) 150 mg in sodium chloride 0.9 % 145 mL IVPB, 150 mg, Intravenous,  Once, 0 of 6 cycles pertuzumab (PERJETA) 420 mg in sodium chloride 0.9 % 250 mL chemo infusion, 420 mg (100 % of original dose 420 mg), Intravenous, Once, 0 of 6 cycles Dose modification: 420 mg  (original dose 420 mg, Cycle 1, Reason: Provider Judgment) trastuzumab-anns (KANJINTI) 882 mg in sodium chloride 0.9 % 250 mL chemo infusion, 8 mg/kg = 882 mg, Intravenous,  Once, 0 of 6 cycles  for chemotherapy treatment.      CHIEF COMPLIANT: Cycle 1 TCH Perjeta will start 1217 2021  INTERVAL HISTORY: Courtney Coleman is a 54 y.o. with above-mentioned history of left breast cancer currently on neoadjuvant chemotherapy with TCH Perjeta. She presents to the clinic today for cycle 1.    She will start chemotherapy tomorrow.  She had a CT chest abdomen pelvis and a breast MRI.  The CT scan did not show any evidence of distant metastatic disease.  MRI result is still pending.  ALLERGIES:  has No Known Allergies.  MEDICATIONS:  Current Outpatient Medications  Medication Sig Dispense Refill  . amiodarone (PACERONE) 100 MG tablet Take 1 tablet (100 mg total) by mouth daily. 30 tablet 0  . amiodarone (PACERONE) 200 MG tablet Take 100 mg by mouth daily.    Marland Kitchen amLODipine (NORVASC) 5 MG tablet Take 1.5 tablets (7.5 mg total) by mouth daily. 45 tablet 0  . anastrozole (ARIMIDEX) 1 MG tablet Take 1 tablet (1 mg total) by mouth daily. 30 tablet 0  . apixaban (ELIQUIS) 5 MG TABS tablet Take 1 tablet (5 mg total) by mouth 2 (two) times daily. 60 tablet 0  . atorvastatin (LIPITOR) 40 MG tablet Take 1 tablet (40 mg total) by mouth daily. 30 tablet 0  .  dexamethasone (DECADRON) 4 MG tablet Take 1 tablet (4 mg total) by mouth daily. Okay take 1 tablet day before chemo and 1 tablet day after chemo with food 12 tablet 0  . hydrALAZINE (APRESOLINE) 10 MG tablet Take 1 tablet (10 mg total) by mouth every 8 (eight) hours. 90 tablet 0  . lidocaine-prilocaine (EMLA) cream Apply to affected area once 30 g 3  . ondansetron (ZOFRAN) 8 MG tablet Take 1 tablet (8 mg total) by mouth 2 (two) times daily as needed (Nausea or vomiting). Start on the third day after chemotherapy. 30 tablet 1  . polyethylene glycol (MIRALAX /  GLYCOLAX) 17 g packet Take 17 g by mouth 2 (two) times daily. 60 each 0  . polyethylene glycol powder (GLYCOLAX/MIRALAX) 17 GM/SCOOP powder SMARTSIG:1 Capful(s) By Mouth Twice Daily    . prochlorperazine (COMPAZINE) 10 MG tablet Take 1 tablet (10 mg total) by mouth every 6 (six) hours as needed (Nausea or vomiting). 30 tablet 1  . sacubitril-valsartan (ENTRESTO) 49-51 MG Take 1 tablet by mouth 2 (two) times daily. 60 tablet 0   No current facility-administered medications for this visit.    PHYSICAL EXAMINATION: ECOG PERFORMANCE STATUS: 1 - Symptomatic but completely ambulatory  Vitals:   05/14/20 1012  BP: (!) 148/79  Pulse: 75  Resp: 18  Temp: 97.9 F (36.6 C)  SpO2: 100%   Filed Weights   05/14/20 1012  Weight: 241 lb 6.4 oz (109.5 kg)    LABORATORY DATA:  I have reviewed the data as listed CMP Latest Ref Rng & Units 05/14/2020 04/20/2020 04/13/2020  Glucose 70 - 99 mg/dL 122(H) 139(H) 101(H)  BUN 6 - 20 mg/dL 11 13 12   Creatinine 0.44 - 1.00 mg/dL 0.71 0.74 0.73  Sodium 135 - 145 mmol/L 141 138 138  Potassium 3.5 - 5.1 mmol/L 3.5 3.5 3.6  Chloride 98 - 111 mmol/L 107 104 106  CO2 22 - 32 mmol/L 24 26 24   Calcium 8.9 - 10.3 mg/dL 9.0 9.2 9.0  Total Protein 6.5 - 8.1 g/dL 7.7 - -  Total Bilirubin 0.3 - 1.2 mg/dL 0.7 - -  Alkaline Phos 38 - 126 U/L 92 - -  AST 15 - 41 U/L 20 - -  ALT 0 - 44 U/L 14 - -    Lab Results  Component Value Date   WBC 7.3 05/14/2020   HGB 10.8 (L) 05/14/2020   HCT 34.1 (L) 05/14/2020   MCV 83.8 05/14/2020   PLT 265 05/14/2020   NEUTROABS 4.5 05/14/2020    ASSESSMENT & PLAN:  Malignant neoplasm of central portion of left breast in female, estrogen receptor positive ([place]) Palpable left breast mass started February 2021 (patient lives in the [place]), went to [place] and mammogram and ultrasound 01/03/2020: 8 cm distortion and calcifications left breast 2:00 and 3 abnormal axillary lymph nodes.  Biopsy revealed grade 3 IDC ER  99%, PR 5%, HER-2 positive.  Treatment Plan:: 1. Neoadjuvant chemotherapy with TCH Perjeta 6 cycles followed by Herceptin Perjeta versus Kadcyla maintenance for 1 year 2. Followed by mastectomy with the targeted node dissection 3. Followed by adjuvant radiation therapy  4.  Followed by antiestrogen therapy and neratinib -------------------------------------------------------------------------------------------------------------------------------- Current Treatment: Cycle 1 TCHP will be tomorrow ECHO 03/28/20: EF 60-65% Labs reviewed, chemo consent obtained. Chemo education complete She did not want to participate in the nausea prevention study.  We provided letters to the patient and her husband submitted to [employer] and her husband's employer regarding her treatment  plan. RTC in 1 week for toxicity check    No orders of the defined types were placed in this encounter.  The patient has a good understanding of the overall plan. she agrees with it. she will call with any problems that may develop before the next visit here.  Total time spent: 30 mins including face to face time and time spent for planning, charting and coordination of care  Nicholas Lose, MD 05/14/2020  I, Cloyde Reams Dorshimer, am acting as scribe for Dr. Nicholas Lose.  I have reviewed the above documentation for accuracy and completeness, and I agree with the above.

## 2020-05-14 ENCOUNTER — Other Ambulatory Visit: Payer: Self-pay

## 2020-05-14 ENCOUNTER — Inpatient Hospital Stay (HOSPITAL_BASED_OUTPATIENT_CLINIC_OR_DEPARTMENT_OTHER): Payer: Self-pay | Admitting: Hematology and Oncology

## 2020-05-14 ENCOUNTER — Encounter: Payer: Self-pay | Admitting: *Deleted

## 2020-05-14 ENCOUNTER — Inpatient Hospital Stay: Payer: Self-pay

## 2020-05-14 DIAGNOSIS — Z7901 Long term (current) use of anticoagulants: Secondary | ICD-10-CM | POA: Diagnosis not present

## 2020-05-14 DIAGNOSIS — Z95828 Presence of other vascular implants and grafts: Secondary | ICD-10-CM

## 2020-05-14 DIAGNOSIS — C50112 Malignant neoplasm of central portion of left female breast: Secondary | ICD-10-CM

## 2020-05-14 DIAGNOSIS — Z170 Estrogen receptor positive status [ER+]: Secondary | ICD-10-CM

## 2020-05-14 DIAGNOSIS — Z79899 Other long term (current) drug therapy: Secondary | ICD-10-CM | POA: Diagnosis not present

## 2020-05-14 DIAGNOSIS — T451X5A Adverse effect of antineoplastic and immunosuppressive drugs, initial encounter: Secondary | ICD-10-CM | POA: Diagnosis not present

## 2020-05-14 DIAGNOSIS — Z5189 Encounter for other specified aftercare: Secondary | ICD-10-CM | POA: Diagnosis not present

## 2020-05-14 DIAGNOSIS — Z5112 Encounter for antineoplastic immunotherapy: Secondary | ICD-10-CM | POA: Diagnosis present

## 2020-05-14 DIAGNOSIS — Z5111 Encounter for antineoplastic chemotherapy: Secondary | ICD-10-CM | POA: Diagnosis present

## 2020-05-14 DIAGNOSIS — D701 Agranulocytosis secondary to cancer chemotherapy: Secondary | ICD-10-CM | POA: Diagnosis not present

## 2020-05-14 DIAGNOSIS — R110 Nausea: Secondary | ICD-10-CM | POA: Diagnosis not present

## 2020-05-14 DIAGNOSIS — Z8673 Personal history of transient ischemic attack (TIA), and cerebral infarction without residual deficits: Secondary | ICD-10-CM | POA: Diagnosis not present

## 2020-05-14 DIAGNOSIS — R197 Diarrhea, unspecified: Secondary | ICD-10-CM | POA: Diagnosis not present

## 2020-05-14 HISTORY — DX: Presence of other vascular implants and grafts: Z95.828

## 2020-05-14 LAB — CMP (CANCER CENTER ONLY)
ALT: 14 U/L (ref 0–44)
AST: 20 U/L (ref 15–41)
Albumin: 3.3 g/dL — ABNORMAL LOW (ref 3.5–5.0)
Alkaline Phosphatase: 92 U/L (ref 38–126)
Anion gap: 10 (ref 5–15)
BUN: 11 mg/dL (ref 6–20)
CO2: 24 mmol/L (ref 22–32)
Calcium: 9 mg/dL (ref 8.9–10.3)
Chloride: 107 mmol/L (ref 98–111)
Creatinine: 0.71 mg/dL (ref 0.44–1.00)
GFR, Estimated: >60 mL/min (ref >60)
Glucose, Bld: 122 mg/dL — ABNORMAL HIGH (ref 70–99)
Potassium: 3.5 mmol/L (ref 3.5–5.1)
Sodium: 141 mmol/L (ref 135–145)
Total Bilirubin: 0.7 mg/dL (ref 0.3–1.2)
Total Protein: 7.7 g/dL (ref 6.5–8.1)

## 2020-05-14 LAB — CBC WITH DIFFERENTIAL (CANCER CENTER ONLY)
Abs Immature Granulocytes: 0.02 10*3/uL (ref 0.00–0.07)
Basophils Absolute: 0 10*3/uL (ref 0.0–0.1)
Basophils Relative: 0 %
Eosinophils Absolute: 0.1 10*3/uL (ref 0.0–0.5)
Eosinophils Relative: 2 %
HCT: 34.1 % — ABNORMAL LOW (ref 36.0–46.0)
Hemoglobin: 10.8 g/dL — ABNORMAL LOW (ref 12.0–15.0)
Immature Granulocytes: 0 %
Lymphocytes Relative: 31 %
Lymphs Abs: 2.3 10*3/uL (ref 0.7–4.0)
MCH: 26.5 pg (ref 26.0–34.0)
MCHC: 31.7 g/dL (ref 30.0–36.0)
MCV: 83.8 fL (ref 80.0–100.0)
Monocytes Absolute: 0.4 10*3/uL (ref 0.1–1.0)
Monocytes Relative: 6 %
Neutro Abs: 4.5 10*3/uL (ref 1.7–7.7)
Neutrophils Relative %: 61 %
Platelet Count: 265 10*3/uL (ref 150–400)
RBC: 4.07 MIL/uL (ref 3.87–5.11)
RDW: 21.2 % — ABNORMAL HIGH (ref 11.5–15.5)
WBC Count: 7.3 10*3/uL (ref 4.0–10.5)
nRBC: 0 % (ref 0.0–0.2)

## 2020-05-14 MED ORDER — HEPARIN SOD (PORK) LOCK FLUSH 100 UNIT/ML IV SOLN
500.0000 [IU] | Freq: Once | INTRAVENOUS | Status: AC
  Administered 2020-05-14: 09:00:00 500 [IU]
  Filled 2020-05-14: qty 5

## 2020-05-14 MED ORDER — SODIUM CHLORIDE 0.9% FLUSH
10.0000 mL | Freq: Once | INTRAVENOUS | Status: AC
  Administered 2020-05-14: 09:00:00 10 mL
  Filled 2020-05-14: qty 10

## 2020-05-15 ENCOUNTER — Other Ambulatory Visit: Payer: Self-pay

## 2020-05-15 ENCOUNTER — Inpatient Hospital Stay: Payer: Self-pay

## 2020-05-15 ENCOUNTER — Encounter: Payer: Self-pay | Admitting: *Deleted

## 2020-05-15 ENCOUNTER — Telehealth: Payer: Self-pay | Admitting: *Deleted

## 2020-05-15 ENCOUNTER — Ambulatory Visit (INDEPENDENT_AMBULATORY_CARE_PROVIDER_SITE_OTHER): Payer: Self-pay | Admitting: Primary Care

## 2020-05-15 VITALS — BP 150/74 | HR 72 | Temp 98.7°F | Resp 16

## 2020-05-15 DIAGNOSIS — Z170 Estrogen receptor positive status [ER+]: Secondary | ICD-10-CM

## 2020-05-15 DIAGNOSIS — Z5111 Encounter for antineoplastic chemotherapy: Secondary | ICD-10-CM | POA: Diagnosis not present

## 2020-05-15 MED ORDER — PALONOSETRON HCL INJECTION 0.25 MG/5ML
INTRAVENOUS | Status: AC
  Filled 2020-05-15: qty 5

## 2020-05-15 MED ORDER — DIPHENHYDRAMINE HCL 25 MG PO CAPS
50.0000 mg | ORAL_CAPSULE | Freq: Once | ORAL | Status: AC
  Administered 2020-05-15: 10:00:00 50 mg via ORAL

## 2020-05-15 MED ORDER — SODIUM CHLORIDE 0.9 % IV SOLN
75.0000 mg/m2 | Freq: Once | INTRAVENOUS | Status: AC
  Administered 2020-05-15: 16:00:00 170 mg via INTRAVENOUS
  Filled 2020-05-15: qty 17

## 2020-05-15 MED ORDER — ACETAMINOPHEN 325 MG PO TABS
ORAL_TABLET | ORAL | Status: AC
  Filled 2020-05-15: qty 2

## 2020-05-15 MED ORDER — SODIUM CHLORIDE 0.9 % IV SOLN
Freq: Once | INTRAVENOUS | Status: AC
  Filled 2020-05-15: qty 250

## 2020-05-15 MED ORDER — SODIUM CHLORIDE 0.9% FLUSH
10.0000 mL | INTRAVENOUS | Status: DC | PRN
Start: 2020-05-15 — End: 2020-05-15
  Administered 2020-05-15: 18:00:00 10 mL
  Filled 2020-05-15: qty 10

## 2020-05-15 MED ORDER — HEPARIN SOD (PORK) LOCK FLUSH 100 UNIT/ML IV SOLN
500.0000 [IU] | Freq: Once | INTRAVENOUS | Status: AC | PRN
  Administered 2020-05-15: 18:00:00 500 [IU]
  Filled 2020-05-15: qty 5

## 2020-05-15 MED ORDER — ACETAMINOPHEN 325 MG PO TABS
650.0000 mg | ORAL_TABLET | Freq: Once | ORAL | Status: AC
  Administered 2020-05-15: 10:00:00 650 mg via ORAL

## 2020-05-15 MED ORDER — TRASTUZUMAB-ANNS CHEMO 150 MG IV SOLR
8.0000 mg/kg | Freq: Once | INTRAVENOUS | Status: AC
  Administered 2020-05-15: 12:00:00 882 mg via INTRAVENOUS
  Filled 2020-05-15: qty 42

## 2020-05-15 MED ORDER — SODIUM CHLORIDE 0.9 % IV SOLN
700.0000 mg | Freq: Once | INTRAVENOUS | Status: AC
  Administered 2020-05-15: 18:00:00 700 mg via INTRAVENOUS
  Filled 2020-05-15: qty 70

## 2020-05-15 MED ORDER — DIPHENHYDRAMINE HCL 25 MG PO CAPS
ORAL_CAPSULE | ORAL | Status: AC
  Filled 2020-05-15: qty 2

## 2020-05-15 MED ORDER — SODIUM CHLORIDE 0.9 % IV SOLN
150.0000 mg | Freq: Once | INTRAVENOUS | Status: AC
  Administered 2020-05-15: 10:00:00 150 mg via INTRAVENOUS
  Filled 2020-05-15: qty 150

## 2020-05-15 MED ORDER — SODIUM CHLORIDE 0.9 % IV SOLN
420.0000 mg | Freq: Once | INTRAVENOUS | Status: AC
  Administered 2020-05-15: 14:00:00 420 mg via INTRAVENOUS
  Filled 2020-05-15: qty 14

## 2020-05-15 MED ORDER — SODIUM CHLORIDE 0.9 % IV SOLN
10.0000 mg | Freq: Once | INTRAVENOUS | Status: AC
  Administered 2020-05-15: 11:00:00 10 mg via INTRAVENOUS
  Filled 2020-05-15: qty 10

## 2020-05-15 MED ORDER — PALONOSETRON HCL INJECTION 0.25 MG/5ML
0.2500 mg | Freq: Once | INTRAVENOUS | Status: AC
  Administered 2020-05-15: 10:00:00 0.25 mg via INTRAVENOUS

## 2020-05-15 NOTE — Patient Instructions (Signed)
[facility] Discharge Instructions for Patients Receiving Chemotherapy  Today you received the following chemotherapy agents herceptin/perjeta/taxotere/carboplatin   To help prevent nausea and vomiting after your treatment, we encourage you to take your nausea medication as directed  If you develop nausea and vomiting that is not controlled by your nausea medication, call the clinic.   BELOW ARE SYMPTOMS THAT SHOULD BE REPORTED IMMEDIATELY:  *FEVER GREATER THAN 100.5 F  *CHILLS WITH OR WITHOUT FEVER  NAUSEA AND VOMITING THAT IS NOT CONTROLLED WITH YOUR NAUSEA MEDICATION  *UNUSUAL SHORTNESS OF BREATH  *UNUSUAL BRUISING OR BLEEDING  TENDERNESS IN MOUTH AND THROAT WITH OR WITHOUT PRESENCE OF ULCERS  *URINARY PROBLEMS  *BOWEL PROBLEMS  UNUSUAL RASH Items with * indicate a potential emergency and should be followed up as soon as possible.  Feel free to call the clinic you have any questions or concerns. The clinic phone number is [phone].  Trastuzumab injection for infusion What is this medicine? TRASTUZUMAB (tras TOO zoo mab) is a monoclonal antibody. It is used to treat breast cancer and stomach cancer. This medicine may be used for other purposes; ask your health care provider or pharmacist if you have questions. COMMON BRAND NAME(S): Herceptin, Galvin Proffer, Trazimera What should I tell my health care provider before I take this medicine? They need to know if you have any of these conditions:  heart disease  heart failure  lung or breathing disease, like asthma  an unusual or allergic reaction to trastuzumab, benzyl alcohol, or other medications, foods, dyes, or preservatives  pregnant or trying to get pregnant  breast-feeding How should I use this medicine? This drug is given as an infusion into a vein. It is administered in a hospital or clinic by a specially trained health care professional. Talk to your pediatrician  regarding the use of this medicine in children. This medicine is not approved for use in children. Overdosage: If you think you have taken too much of this medicine contact a poison control center or emergency room at once. NOTE: This medicine is only for you. Do not share this medicine with others. What if I miss a dose? It is important not to miss a dose. Call your doctor or health care professional if you are unable to keep an appointment. What may interact with this medicine? This medicine may interact with the following medications:  certain types of chemotherapy, such as daunorubicin, doxorubicin, epirubicin, and idarubicin This list may not describe all possible interactions. Give your health care provider a list of all the medicines, herbs, non-prescription drugs, or dietary supplements you use. Also tell them if you smoke, drink alcohol, or use illegal drugs. Some items may interact with your medicine. What should I watch for while using this medicine? Visit your doctor for checks on your progress. Report any side effects. Continue your course of treatment even though you feel ill unless your doctor tells you to stop. Call your doctor or health care professional for advice if you get a fever, chills or sore throat, or other symptoms of a cold or flu. Do not treat yourself. Try to avoid being around people who are sick. You may experience fever, chills and shaking during your first infusion. These effects are usually mild and can be treated with other medicines. Report any side effects during the infusion to your health care professional. Fever and chills usually do not happen with later infusions. Do not become pregnant while taking this medicine or for  7 months after stopping it. Women should inform their doctor if they wish to become pregnant or think they might be pregnant. Women of child-bearing potential will need to have a negative pregnancy test before starting this medicine. There is a  potential for serious side effects to an unborn child. Talk to your health care professional or pharmacist for more information. Do not breast-feed an infant while taking this medicine or for 7 months after stopping it. Women must use effective birth control with this medicine. What side effects may I notice from receiving this medicine? Side effects that you should report to your doctor or health care professional as soon as possible:  allergic reactions like skin rash, itching or hives, swelling of the face, lips, or tongue  chest pain or palpitations  cough  dizziness  feeling faint or lightheaded, falls  fever  general ill feeling or flu-like symptoms  signs of worsening heart failure like breathing problems; swelling in your legs and feet  unusually weak or tired Side effects that usually do not require medical attention (report to your doctor or health care professional if they continue or are bothersome):  bone pain  changes in taste  diarrhea  joint pain  nausea/vomiting  weight loss This list may not describe all possible side effects. Call your doctor for medical advice about side effects. You may report side effects to FDA at 1-800-FDA-1088. Where should I keep my medicine? This drug is given in a hospital or clinic and will not be stored at home. NOTE: This sheet is a summary. It may not cover all possible information. If you have questions about this medicine, talk to your doctor, pharmacist, or health care provider.  2020 Elsevier/Gold Standard (2016-05-10 14:37:52)  Pertuzumab injection What is this medicine? PERTUZUMAB (per TOOZ ue mab) is a monoclonal antibody. It is used to treat breast cancer. This medicine may be used for other purposes; ask your health care provider or pharmacist if you have questions. COMMON BRAND NAME(S): PERJETA What should I tell my health care provider before I take this medicine? They need to know if you have any of these  conditions:  heart disease  heart failure  high blood pressure  history of irregular heart beat  recent or ongoing radiation therapy  an unusual or allergic reaction to pertuzumab, other medicines, foods, dyes, or preservatives  pregnant or trying to get pregnant  breast-feeding How should I use this medicine? This medicine is for infusion into a vein. It is given by a health care professional in a hospital or clinic setting. Talk to your pediatrician regarding the use of this medicine in children. Special care may be needed. Overdosage: If you think you have taken too much of this medicine contact a poison control center or emergency room at once. NOTE: This medicine is only for you. Do not share this medicine with others. What if I miss a dose? It is important not to miss your dose. Call your doctor or health care professional if you are unable to keep an appointment. What may interact with this medicine? Interactions are not expected. Give your health care provider a list of all the medicines, herbs, non-prescription drugs, or dietary supplements you use. Also tell them if you smoke, drink alcohol, or use illegal drugs. Some items may interact with your medicine. This list may not describe all possible interactions. Give your health care provider a list of all the medicines, herbs, non-prescription drugs, or dietary supplements you use. Also  tell them if you smoke, drink alcohol, or use illegal drugs. Some items may interact with your medicine. What should I watch for while using this medicine? Your condition will be monitored carefully while you are receiving this medicine. Report any side effects. Continue your course of treatment even though you feel ill unless your doctor tells you to stop. Do not become pregnant while taking this medicine or for 7 months after stopping it. Women should inform their doctor if they wish to become pregnant or think they might be pregnant. Women of  child-bearing potential will need to have a negative pregnancy test before starting this medicine. There is a potential for serious side effects to an unborn child. Talk to your health care professional or pharmacist for more information. Do not breast-feed an infant while taking this medicine or for 7 months after stopping it. Women must use effective birth control with this medicine. Call your doctor or health care professional for advice if you get a fever, chills or sore throat, or other symptoms of a cold or flu. Do not treat yourself. Try to avoid being around people who are sick. You may experience fever, chills, and headache during the infusion. Report any side effects during the infusion to your health care professional. What side effects may I notice from receiving this medicine? Side effects that you should report to your doctor or health care professional as soon as possible:  breathing problems  chest pain or palpitations  dizziness  feeling faint or lightheaded  fever or chills  skin rash, itching or hives  sore throat  swelling of the face, lips, or tongue  swelling of the legs or ankles  unusually weak or tired Side effects that usually do not require medical attention (report to your doctor or health care professional if they continue or are bothersome):  diarrhea  hair loss  nausea, vomiting  tiredness This list may not describe all possible side effects. Call your doctor for medical advice about side effects. You may report side effects to FDA at 1-800-FDA-1088. Where should I keep my medicine? This drug is given in a hospital or clinic and will not be stored at home. NOTE: This sheet is a summary. It may not cover all possible information. If you have questions about this medicine, talk to your doctor, pharmacist, or health care provider.  2020 Elsevier/Gold Standard (2015-06-18 12:08:50)  Docetaxel injection What is this medicine? DOCETAXEL (doe se TAX  el) is a chemotherapy drug. It targets fast dividing cells, like cancer cells, and causes these cells to die. This medicine is used to treat many types of cancers like breast cancer, certain stomach cancers, head and neck cancer, lung cancer, and prostate cancer. This medicine may be used for other purposes; ask your health care provider or pharmacist if you have questions. COMMON BRAND NAME(S): Docefrez, Taxotere What should I tell my health care provider before I take this medicine? They need to know if you have any of these conditions:  infection (especially a virus infection such as chickenpox, cold sores, or herpes)  liver disease  low blood counts, like low white cell, platelet, or red cell counts  an unusual or allergic reaction to docetaxel, polysorbate 80, other chemotherapy agents, other medicines, foods, dyes, or preservatives  pregnant or trying to get pregnant  breast-feeding How should I use this medicine? This drug is given as an infusion into a vein. It is administered in a hospital or clinic by a specially trained health  care professional. Talk to your pediatrician regarding the use of this medicine in children. Special care may be needed. Overdosage: If you think you have taken too much of this medicine contact a poison control center or emergency room at once. NOTE: This medicine is only for you. Do not share this medicine with others. What if I miss a dose? It is important not to miss your dose. Call your doctor or health care professional if you are unable to keep an appointment. What may interact with this medicine?  aprepitant  certain antibiotics like erythromycin or clarithromycin  certain antivirals for HIV or hepatitis  certain medicines for fungal infections like fluconazole, itraconazole, ketoconazole, posaconazole, or voriconazole  cimetidine  ciprofloxacin  conivaptan  cyclosporine  dronedarone  fluvoxamine  grapefruit  juice  imatinib  verapamil This list may not describe all possible interactions. Give your health care provider a list of all the medicines, herbs, non-prescription drugs, or dietary supplements you use. Also tell them if you smoke, drink alcohol, or use illegal drugs. Some items may interact with your medicine. What should I watch for while using this medicine? Your condition will be monitored carefully while you are receiving this medicine. You will need important blood work done while you are taking this medicine. Call your doctor or health care professional for advice if you get a fever, chills or sore throat, or other symptoms of a cold or flu. Do not treat yourself. This drug decreases your body's ability to fight infections. Try to avoid being around people who are sick. Some products may contain alcohol. Ask your health care professional if this medicine contains alcohol. Be sure to tell all health care professionals you are taking this medicine. Certain medicines, like metronidazole and disulfiram, can cause an unpleasant reaction when taken with alcohol. The reaction includes flushing, headache, nausea, vomiting, sweating, and increased thirst. The reaction can last from 30 minutes to several hours. You may get drowsy or dizzy. Do not drive, use machinery, or do anything that needs mental alertness until you know how this medicine affects you. Do not stand or sit up quickly, especially if you are an older patient. This reduces the risk of dizzy or fainting spells. Alcohol may interfere with the effect of this medicine. Talk to your health care professional about your risk of cancer. You may be more at risk for certain types of cancer if you take this medicine. Do not become pregnant while taking this medicine or for 6 months after stopping it. Women should inform their doctor if they wish to become pregnant or think they might be pregnant. There is a potential for serious side effects to an  unborn child. Talk to your health care professional or pharmacist for more information. Do not breast-feed an infant while taking this medicine or for 1 week after stopping it. Males who get this medicine must use a condom during sex with females who can get pregnant. If you get a woman pregnant, the baby could have birth defects. The baby could die before they are born. You will need to continue wearing a condom for 3 months after stopping the medicine. Tell your health care provider right away if your partner becomes pregnant while you are taking this medicine. This may interfere with the ability to father a child. You should talk to your doctor or health care professional if you are concerned about your fertility. What side effects may I notice from receiving this medicine? Side effects that you should  report to your doctor or health care professional as soon as possible:  allergic reactions like skin rash, itching or hives, swelling of the face, lips, or tongue  blurred vision  breathing problems  changes in vision  low blood counts - This drug may decrease the number of white blood cells, red blood cells and platelets. You may be at increased risk for infections and bleeding.  nausea and vomiting  pain, redness or irritation at site where injected  pain, tingling, numbness in the hands or feet  redness, blistering, peeling, or loosening of the skin, including inside the mouth  signs of decreased platelets or bleeding - bruising, pinpoint red spots on the skin, black, tarry stools, nosebleeds  signs of decreased red blood cells - unusually weak or tired, fainting spells, lightheadedness  signs of infection - fever or chills, cough, sore throat, pain or difficulty passing urine  swelling of the ankle, feet, hands Side effects that usually do not require medical attention (report to your doctor or health care professional if they continue or are  bothersome):  constipation  diarrhea  fingernail or toenail changes  hair loss  loss of appetite  mouth sores  muscle pain This list may not describe all possible side effects. Call your doctor for medical advice about side effects. You may report side effects to FDA at 1-800-FDA-1088. Where should I keep my medicine? This drug is given in a hospital or clinic and will not be stored at home. NOTE: This sheet is a summary. It may not cover all possible information. If you have questions about this medicine, talk to your doctor, pharmacist, or health care provider.  2020 Elsevier/Gold Standard (2019-01-10 10:19:06)  Carboplatin injection What is this medicine? CARBOPLATIN (KAR boe pla tin) is a chemotherapy drug. It targets fast dividing cells, like cancer cells, and causes these cells to die. This medicine is used to treat ovarian cancer and many other cancers. This medicine may be used for other purposes; ask your health care provider or pharmacist if you have questions. COMMON BRAND NAME(S): Paraplatin What should I tell my health care provider before I take this medicine? They need to know if you have any of these conditions:  blood disorders  hearing problems  kidney disease  recent or ongoing radiation therapy  an unusual or allergic reaction to carboplatin, cisplatin, other chemotherapy, other medicines, foods, dyes, or preservatives  pregnant or trying to get pregnant  breast-feeding How should I use this medicine? This drug is usually given as an infusion into a vein. It is administered in a hospital or clinic by a specially trained health care professional. Talk to your pediatrician regarding the use of this medicine in children. Special care may be needed. Overdosage: If you think you have taken too much of this medicine contact a poison control center or emergency room at once. NOTE: This medicine is only for you. Do not share this medicine with others. What  if I miss a dose? It is important not to miss a dose. Call your doctor or health care professional if you are unable to keep an appointment. What may interact with this medicine?  medicines for seizures  medicines to increase blood counts like filgrastim, pegfilgrastim, sargramostim  some antibiotics like amikacin, gentamicin, neomycin, streptomycin, tobramycin  vaccines Talk to your doctor or health care professional before taking any of these medicines:  acetaminophen  aspirin  ibuprofen  ketoprofen  naproxen This list may not describe all possible interactions. Give your  health care provider a list of all the medicines, herbs, non-prescription drugs, or dietary supplements you use. Also tell them if you smoke, drink alcohol, or use illegal drugs. Some items may interact with your medicine. What should I watch for while using this medicine? Your condition will be monitored carefully while you are receiving this medicine. You will need important blood work done while you are taking this medicine. This drug may make you feel generally unwell. This is not uncommon, as chemotherapy can affect healthy cells as well as cancer cells. Report any side effects. Continue your course of treatment even though you feel ill unless your doctor tells you to stop. In some cases, you may be given additional medicines to help with side effects. Follow all directions for their use. Call your doctor or health care professional for advice if you get a fever, chills or sore throat, or other symptoms of a cold or flu. Do not treat yourself. This drug decreases your body's ability to fight infections. Try to avoid being around people who are sick. This medicine may increase your risk to bruise or bleed. Call your doctor or health care professional if you notice any unusual bleeding. Be careful brushing and flossing your teeth or using a toothpick because you may get an infection or bleed more easily. If you  have any dental work done, tell your dentist you are receiving this medicine. Avoid taking products that contain aspirin, acetaminophen, ibuprofen, naproxen, or ketoprofen unless instructed by your doctor. These medicines may hide a fever. Do not become pregnant while taking this medicine. Women should inform their doctor if they wish to become pregnant or think they might be pregnant. There is a potential for serious side effects to an unborn child. Talk to your health care professional or pharmacist for more information. Do not breast-feed an infant while taking this medicine. What side effects may I notice from receiving this medicine? Side effects that you should report to your doctor or health care professional as soon as possible:  allergic reactions like skin rash, itching or hives, swelling of the face, lips, or tongue  signs of infection - fever or chills, cough, sore throat, pain or difficulty passing urine  signs of decreased platelets or bleeding - bruising, pinpoint red spots on the skin, black, tarry stools, nosebleeds  signs of decreased red blood cells - unusually weak or tired, fainting spells, lightheadedness  breathing problems  changes in hearing  changes in vision  chest pain  high blood pressure  low blood counts - This drug may decrease the number of white blood cells, red blood cells and platelets. You may be at increased risk for infections and bleeding.  nausea and vomiting  pain, swelling, redness or irritation at the injection site  pain, tingling, numbness in the hands or feet  problems with balance, talking, walking  trouble passing urine or change in the amount of urine Side effects that usually do not require medical attention (report to your doctor or health care professional if they continue or are bothersome):  hair loss  loss of appetite  metallic taste in the mouth or changes in taste This list may not describe all possible side effects.  Call your doctor for medical advice about side effects. You may report side effects to FDA at 1-800-FDA-1088. Where should I keep my medicine? This drug is given in a hospital or clinic and will not be stored at home. NOTE: This sheet is a summary. It may  not cover all possible information. If you have questions about this medicine, talk to your doctor, pharmacist, or health care provider.  2020 Elsevier/Gold Standard (2007-08-21 14:38:05)

## 2020-05-15 NOTE — Progress Notes (Signed)
..  The following medication [place] from [place] was denied for patient assistance through replacement. Reason for denial Patient has not lived in the [place] longer then 6 months and not [place] citizen. Trying to apply for Assistance for Herceptin.

## 2020-05-15 NOTE — Telephone Encounter (Signed)
05/14/2020 Message forwarded today to this nurse.  Collaborative nurse advised patient office will obtain leave renewal forms for patient and spouse.        Connected with patient in infusion room section I today.  Expressed responsibility of the initial and renewal of leave or a disability is the patient responsibility.  Also expressed contacting employer Health and safety inspector.         Courtney Coleman declines offer for fax and e-mail information.  "I will deliver forms to office."  Allow 7 to 10 business days to process or complete forms.

## 2020-05-18 ENCOUNTER — Other Ambulatory Visit: Payer: Self-pay

## 2020-05-18 ENCOUNTER — Other Ambulatory Visit: Payer: Self-pay | Admitting: Physical Medicine and Rehabilitation

## 2020-05-18 ENCOUNTER — Inpatient Hospital Stay: Payer: Self-pay

## 2020-05-18 ENCOUNTER — Other Ambulatory Visit: Payer: Self-pay | Admitting: Hematology and Oncology

## 2020-05-18 ENCOUNTER — Other Ambulatory Visit: Payer: Self-pay | Admitting: *Deleted

## 2020-05-18 VITALS — BP 133/73 | HR 71 | Temp 98.2°F | Resp 18

## 2020-05-18 DIAGNOSIS — Z170 Estrogen receptor positive status [ER+]: Secondary | ICD-10-CM

## 2020-05-18 DIAGNOSIS — Z5111 Encounter for antineoplastic chemotherapy: Secondary | ICD-10-CM | POA: Diagnosis not present

## 2020-05-18 MED ORDER — ANASTROZOLE 1 MG PO TABS: 1.0000 mg | ORAL_TABLET | Freq: Every day | ORAL | 0 refills | Status: DC

## 2020-05-18 MED ORDER — APIXABAN 5 MG PO TABS: 5.0000 mg | ORAL_TABLET | Freq: Two times a day (BID) | ORAL | 0 refills | Status: DC

## 2020-05-18 MED ORDER — AMIODARONE HCL 200 MG PO TABS: 100.0000 mg | ORAL_TABLET | Freq: Every day | ORAL | 0 refills | Status: DC

## 2020-05-18 MED ORDER — AMLODIPINE BESYLATE 5 MG PO TABS: 7.5000 mg | ORAL_TABLET | Freq: Every day | ORAL | 0 refills | Status: DC

## 2020-05-18 MED ORDER — HYDRALAZINE HCL 10 MG PO TABS: 10.0000 mg | ORAL_TABLET | Freq: Three times a day (TID) | ORAL | 0 refills | Status: DC

## 2020-05-18 MED ORDER — PEGFILGRASTIM-CBQV 6 MG/0.6ML ~~LOC~~ SOSY
PREFILLED_SYRINGE | SUBCUTANEOUS | Status: AC
  Filled 2020-05-18: qty 0.6

## 2020-05-18 MED ORDER — ATORVASTATIN CALCIUM 40 MG PO TABS: 40.0000 mg | ORAL_TABLET | Freq: Every day | ORAL | 0 refills | Status: DC

## 2020-05-18 MED ORDER — PEGFILGRASTIM-CBQV 6 MG/0.6ML ~~LOC~~ SOSY
6.0000 mg | PREFILLED_SYRINGE | Freq: Once | SUBCUTANEOUS | Status: AC
  Administered 2020-05-18: 10:00:00 6 mg via SUBCUTANEOUS

## 2020-05-18 MED FILL — hydrALAZINE HCL 10 MG TABS: 10 | 30 days supply | Qty: 90 | Fill #0

## 2020-05-18 MED FILL — PROCHLORPERAZINE 10 MG TAB: 10 | 7 days supply | Qty: 30 | Fill #0

## 2020-05-18 MED FILL — AMLODIPINE BESYLATE 5 MG TA: 5 | 30 days supply | Qty: 45 | Fill #0

## 2020-05-18 MED FILL — ONDANSETRON HCL 8 MG TABLET: 8 | 15 days supply | Qty: 30 | Fill #0

## 2020-05-18 MED FILL — ANASTROZOLE 1 MG TABLET: 1 | 30 days supply | Qty: 30 | Fill #0

## 2020-05-18 MED FILL — ELIQUIS 5 MG TABLET: 5 | 30 days supply | Qty: 60 | Fill #0

## 2020-05-18 MED FILL — ATORVASTATIN 40 MG TABLET: 40 | 30 days supply | Qty: 30 | Fill #0

## 2020-05-18 MED FILL — AMIODARONE HCL 200 MG TAB: 200 | 30 days supply | Qty: 15 | Fill #0

## 2020-05-18 NOTE — Patient Instructions (Signed)

## 2020-05-19 ENCOUNTER — Telehealth: Payer: Self-pay | Admitting: Hematology and Oncology

## 2020-05-19 NOTE — Telephone Encounter (Signed)
No 12/16 los, no changes made to pt schedule

## 2020-05-20 NOTE — Progress Notes (Signed)
..  The following Medication: Herceptin is approved for drug replacement program by [place]. The enrollment period is from 05/20/2020 to 05/20/2021.  Reason for Assistance: Self Pay. ID: [id number] First DOS:06/04/2020.

## 2020-05-20 NOTE — Progress Notes (Signed)
.  The following biosimilar Herceptin has been selected for use in this patient due to patient assistance enrollment approval from [place] only.  Henreitta Leber, PharmD

## 2020-05-20 NOTE — Progress Notes (Signed)
Patient Care Team: Kerin Perna, NP as PCP - General (Internal Medicine) Mauro Kaufmann, RN as Oncology Nurse Navigator Rockwell Germany, RN as Oncology Nurse Navigator  DIAGNOSIS:    ICD-10-CM   1. Malignant neoplasm of central portion of left breast in female, estrogen receptor positive ([place])  C50.112    Z17.0     SUMMARY OF ONCOLOGIC HISTORY: Oncology History  Malignant neoplasm of central portion of left breast in female, estrogen receptor positive ([place])   Initial Diagnosis   Palpable left breast mass started February 2021 (patient lives in the [place]), went to [place] and mammogram and ultrasound 01/03/2020: 8 cm distortion and calcifications left breast 2:00 and 3 abnormal axillary lymph nodes.  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   05/15/2020 -  Chemotherapy   The patient had dexamethasone (DECADRON) 4 MG tablet, 4 mg (100 % of original dose 4 mg), Oral, Daily, 1 of 1 cycle, Start date: 04/28/2020, End date: -- Dose modification: 4 mg (original dose 4 mg, Cycle 0) palonosetron (ALOXI) injection 0.25 mg, 0.25 mg, Intravenous,  Once, 1 of 6 cycles Administration: 0.25 mg (05/15/2020) pegfilgrastim-cbqv (UDENYCA) injection 6 mg, 6 mg, Subcutaneous, Once, 1 of 6 cycles Administration: 6 mg (05/18/2020) trastuzumab (HERCEPTIN) 651 mg in sodium chloride 0.9 % 250 mL chemo infusion, 6 mg/kg = 651 mg, Intravenous,  Once, 0 of 5 cycles CARBOplatin (PARAPLATIN) 700 mg in sodium chloride 0.9 % 250 mL chemo infusion, 700 mg (100 % of original dose 700 mg), Intravenous,  Once, 1 of 6 cycles Dose modification: 700 mg (original dose 700 mg, Cycle 1) Administration: 700 mg (05/15/2020) DOCEtaxel (TAXOTERE) 170 mg in sodium chloride 0.9 % 250 mL chemo infusion, 75 mg/m2 = 170 mg, Intravenous,  Once, 1 of 6 cycles Administration: 170 mg (05/15/2020) fosaprepitant  (EMEND) 150 mg in sodium chloride 0.9 % 145 mL IVPB, 150 mg, Intravenous,  Once, 1 of 6 cycles Administration: 150 mg (05/15/2020) pertuzumab (PERJETA) 420 mg in sodium chloride 0.9 % 250 mL chemo infusion, 420 mg (100 % of original dose 420 mg), Intravenous, Once, 1 of 6 cycles Dose modification: 420 mg (original dose 420 mg, Cycle 1, Reason: Provider Judgment) Administration: 420 mg (05/15/2020) trastuzumab-anns (KANJINTI) 882 mg in sodium chloride 0.9 % 250 mL chemo infusion, 8 mg/kg = 882 mg, Intravenous,  Once, 1 of 1 cycle Administration: 882 mg (05/15/2020)  for chemotherapy treatment.      CHIEF COMPLIANT: Cycle 1 Day 7 TCH Perjeta  INTERVAL HISTORY: Courtney Coleman is a 54 y.o. with above-mentioned history of left breast cancer currently on neoadjuvant chemotherapy with [place]. She presents to the clinic todayfor a toxicity check following cycle 1.     She tolerated treatment extremely well.  Her only complaint is diarrhea over the past 2 to 3 days.  She has not taken any Imodium.  She has had 2-3 episodes of loose stools.  She is worried about having an accident.  She had mild nausea.  It appears that she did not take any of her nausea medications.  ALLERGIES:  has No Known Allergies.  MEDICATIONS:  Current Outpatient Medications  Medication Sig Dispense Refill  . amiodarone (PACERONE) 100 MG tablet Take 1 tablet (100 mg total) by mouth daily. 30 tablet 0  . amiodarone (PACERONE) 200 MG tablet Take 0.5 tablets (100 mg total) by mouth daily.  30 tablet 0  . amLODipine (NORVASC) 5 MG tablet Take 1.5 tablets (7.5 mg total) by mouth daily. 45 tablet 0  . anastrozole (ARIMIDEX) 1 MG tablet Take 1 tablet (1 mg total) by mouth daily. 30 tablet 0  . apixaban (ELIQUIS) 5 MG TABS tablet Take 1 tablet (5 mg total) by mouth 2 (two) times daily. 60 tablet 0  . atorvastatin (LIPITOR) 40 MG tablet Take 1 tablet (40 mg total) by mouth daily. 30 tablet 0  . dexamethasone (DECADRON) 4 MG tablet  Take 1 tablet (4 mg total) by mouth daily. Okay take 1 tablet day before chemo and 1 tablet day after chemo with food 12 tablet 0  . hydrALAZINE (APRESOLINE) 10 MG tablet Take 1 tablet (10 mg total) by mouth every 8 (eight) hours. 90 tablet 0  . lidocaine-prilocaine (EMLA) cream Apply to affected area once 30 g 3  . ondansetron (ZOFRAN) 8 MG tablet Take 1 tablet (8 mg total) by mouth 2 (two) times daily as needed (Nausea or vomiting). Start on the third day after chemotherapy. 30 tablet 1  . polyethylene glycol (MIRALAX / GLYCOLAX) 17 g packet Take 17 g by mouth 2 (two) times daily. 60 each 0  . polyethylene glycol powder (GLYCOLAX/MIRALAX) 17 GM/SCOOP powder SMARTSIG:1 Capful(s) By Mouth Twice Daily    . prochlorperazine (COMPAZINE) 10 MG tablet Take 1 tablet (10 mg total) by mouth every 6 (six) hours as needed (Nausea or vomiting). 30 tablet 1  . sacubitril-valsartan (ENTRESTO) 49-51 MG Take 1 tablet by mouth 2 (two) times daily. 60 tablet 0   No current facility-administered medications for this visit.    PHYSICAL EXAMINATION: ECOG PERFORMANCE STATUS: 1 - Symptomatic but completely ambulatory  Vitals:   05/21/20 1315  BP: (!) 146/71  Pulse: 83  Resp: 18  Temp: 98.2 F (36.8 C)  SpO2: 99%   Filed Weights   05/21/20 1315  Weight: 231 lb 8 oz (105 kg)    LABORATORY DATA:  I have reviewed the data as listed CMP Latest Ref Rng & Units 05/21/2020 05/14/2020 04/20/2020  Glucose 70 - 99 mg/dL 111(H) 122(H) 139(H)  BUN 6 - 20 mg/dL _0 Creatinine 0.44 - 1.00 mg/dL 0.69 0.71 0.74  Sodium 135 - 145 mmol/L 141 141 138  Potassium 3.5 - 5.1 mmol/L 3.4(L) 3.5 3.5  Chloride 98 - 111 mmol/L 107 107 104  CO2 22 - 32 mmol/L _1 Calcium 8.9 - 10.3 mg/dL 8.9 9.0 9.2  Total Protein 6.5 - 8.1 g/dL 7.8 7.7 -  Total Bilirubin 0.3 - 1.2 mg/dL 1.0 0.7 -  Alkaline Phos 38 - 126 U/L 95 92 -  AST 15 - 41 U/L 19 20 -  ALT 0 - 44 U/L 21 14 -    Lab Results  Component Value Date   WBC  1.6 (L) 05/21/2020   HGB 10.6 (L) 05/21/2020   HCT 33.2 (L) 05/21/2020   MCV 85.6 05/21/2020   PLT 163 05/21/2020   NEUTROABS PENDING 05/21/2020    ASSESSMENT & PLAN:  Malignant neoplasm of central portion of left breast in female, estrogen receptor positive ([place]) Palpable left breast mass started February 2021 (patient lives in the [place]), went to [place] and mammogram and ultrasound 01/03/2020: 8 cm distortion and calcifications left breast 2:00 and 3 abnormal axillary lymph nodes. 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  Treatment Plan:: 1. Neoadjuvant chemotherapy with TCH Perjeta 6 cycles followed  by HerceptinPerjeta versus Kadcylamaintenance for 1 year 2. Followed bymastectomy with the targeted node dissection 3. Followed by adjuvant radiation therapy 4.Followed by antiestrogen therapy and neratinib -------------------------------------------------------------------------------------------------------------------------------- Current Treatment: Cycle 1 day 7 TCHP   ECHO 03/28/20: EF 60-65%  Chemo toxicities: 1.  Diarrhea: I instructed her to take Imodium.  I sent a prescription to [facility]. 2. mild nausea 3.  Leukopenia/neutropenia: We will reduce the dosage of cycle 2.  Return to clinic in 2 weeks with cycle 2    No orders of the defined types were placed in this encounter.  The patient has a good understanding of the overall plan. she agrees with it. she will call with any problems that may develop before the next visit here.  Total time spent: 30 mins including face to face time and time spent for planning, charting and coordination of care  Nicholas Lose, MD 05/21/2020  I, Cloyde Reams Dorshimer, am acting as scribe for Dr. Nicholas Lose.  I have reviewed the above documentation for accuracy and completeness, and I agree with the above.

## 2020-05-21 ENCOUNTER — Encounter: Payer: Self-pay | Admitting: *Deleted

## 2020-05-21 ENCOUNTER — Inpatient Hospital Stay: Payer: Self-pay

## 2020-05-21 ENCOUNTER — Inpatient Hospital Stay (HOSPITAL_BASED_OUTPATIENT_CLINIC_OR_DEPARTMENT_OTHER): Payer: Self-pay | Admitting: Hematology and Oncology

## 2020-05-21 ENCOUNTER — Other Ambulatory Visit: Payer: Self-pay

## 2020-05-21 DIAGNOSIS — Z95828 Presence of other vascular implants and grafts: Secondary | ICD-10-CM

## 2020-05-21 DIAGNOSIS — Z5111 Encounter for antineoplastic chemotherapy: Secondary | ICD-10-CM | POA: Diagnosis not present

## 2020-05-21 DIAGNOSIS — Z170 Estrogen receptor positive status [ER+]: Secondary | ICD-10-CM

## 2020-05-21 DIAGNOSIS — C50112 Malignant neoplasm of central portion of left female breast: Secondary | ICD-10-CM

## 2020-05-21 LAB — CBC WITH DIFFERENTIAL (CANCER CENTER ONLY)
Abs Immature Granulocytes: 0 10*3/uL (ref 0.00–0.07)
Basophils Absolute: 0 10*3/uL (ref 0.0–0.1)
Basophils Relative: 1 %
Eosinophils Absolute: 0 10*3/uL (ref 0.0–0.5)
Eosinophils Relative: 3 %
HCT: 33.2 % — ABNORMAL LOW (ref 36.0–46.0)
Hemoglobin: 10.6 g/dL — ABNORMAL LOW (ref 12.0–15.0)
Immature Granulocytes: 0 %
Lymphocytes Relative: 75 %
Lymphs Abs: 1.2 10*3/uL (ref 0.7–4.0)
MCH: 27.3 pg (ref 26.0–34.0)
MCHC: 31.9 g/dL (ref 30.0–36.0)
MCV: 85.6 fL (ref 80.0–100.0)
Monocytes Absolute: 0.1 10*3/uL (ref 0.1–1.0)
Monocytes Relative: 6 %
Neutro Abs: 0.2 10*3/uL — CL (ref 1.7–7.7)
Neutrophils Relative %: 15 %
Platelet Count: 163 10*3/uL (ref 150–400)
RBC: 3.88 MIL/uL (ref 3.87–5.11)
RDW: 21.1 % — ABNORMAL HIGH (ref 11.5–15.5)
WBC Count: 1.6 10*3/uL — ABNORMAL LOW (ref 4.0–10.5)
nRBC: 0 % (ref 0.0–0.2)

## 2020-05-21 LAB — CMP (CANCER CENTER ONLY)
ALT: 21 U/L (ref 0–44)
AST: 19 U/L (ref 15–41)
Albumin: 3.4 g/dL — ABNORMAL LOW (ref 3.5–5.0)
Alkaline Phosphatase: 95 U/L (ref 38–126)
Anion gap: 9 (ref 5–15)
BUN: 15 mg/dL (ref 6–20)
CO2: 25 mmol/L (ref 22–32)
Calcium: 8.9 mg/dL (ref 8.9–10.3)
Chloride: 107 mmol/L (ref 98–111)
Creatinine: 0.69 mg/dL (ref 0.44–1.00)
GFR, Estimated: >60 mL/min (ref >60)
Glucose, Bld: 111 mg/dL — ABNORMAL HIGH (ref 70–99)
Potassium: 3.4 mmol/L — ABNORMAL LOW (ref 3.5–5.1)
Sodium: 141 mmol/L (ref 135–145)
Total Bilirubin: 1 mg/dL (ref 0.3–1.2)
Total Protein: 7.8 g/dL (ref 6.5–8.1)

## 2020-05-21 MED ORDER — SODIUM CHLORIDE 0.9% FLUSH
10.0000 mL | Freq: Once | INTRAVENOUS | Status: AC
Start: 2020-05-21 — End: 2020-05-21
  Administered 2020-05-21: 13:00:00 10 mL
  Filled 2020-05-21: qty 10

## 2020-05-21 MED ORDER — HEPARIN SOD (PORK) LOCK FLUSH 100 UNIT/ML IV SOLN
500.0000 [IU] | Freq: Once | INTRAVENOUS | Status: AC
  Administered 2020-05-21: 13:00:00 500 [IU]
  Filled 2020-05-21: qty 5

## 2020-05-21 MED ORDER — LOPERAMIDE HCL 2 MG PO CAPS: 2.0000 mg | ORAL_CAPSULE | ORAL | 1 refills | Status: DC | PRN

## 2020-05-21 NOTE — Assessment & Plan Note (Signed)
Palpable left breast mass started February 2021 (patient lives in the [place]), went to [place] and mammogram and ultrasound 01/03/2020: 8 cm distortion and calcifications left breast 2:00 and 3 abnormal axillary lymph nodes. 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  Treatment Plan:: 1. Neoadjuvant chemotherapy with [place] Perjeta 6 cycles followed by HerceptinPerjeta versus Kadcylamaintenance for 1 year 2. Followed bymastectomy with the targeted node dissection 3. Followed by adjuvant radiation therapy 4.Followed by antiestrogen therapy and neratinib -------------------------------------------------------------------------------------------------------------------------------- Current Treatment: Cycle 1 day 8 TCHP   ECHO 03/28/20: EF 60-65%  Chemo toxicities:  Return to clinic in 2 weeks with cycle 2

## 2020-05-21 NOTE — Progress Notes (Signed)
CRITICAL VALUE ALERT  Critical Value:  ANC 0.2  Date & Time Notied:  05/21/20 at 1340  Provider Notified: Nicholas Lose, MD  Orders Received/Actions taken:  MD notified and verbalized understanding.  No orders received at this time.

## 2020-05-25 ENCOUNTER — Telehealth: Payer: Self-pay | Admitting: Hematology and Oncology

## 2020-05-25 NOTE — Telephone Encounter (Signed)
No 12/23 los, no changes made to pt schedule

## 2020-06-03 ENCOUNTER — Telehealth: Payer: Self-pay | Admitting: *Deleted

## 2020-06-03 ENCOUNTER — Encounter: Payer: Self-pay | Admitting: Adult Health

## 2020-06-03 ENCOUNTER — Inpatient Hospital Stay: Payer: No Typology Code available for payment source | Admitting: Adult Health

## 2020-06-03 NOTE — Telephone Encounter (Signed)
No showed hospital follow up.

## 2020-06-03 NOTE — Progress Notes (Deleted)
[facility] [address] [place]. [place] [address] [phone]       HOSPITAL FOLLOW UP NOTE  [name] Date of Birth:  Feb 16, 1966 Medical Record Number:  [id number]   Reason for Referral:  hospital stroke follow up    SUBJECTIVE:   CHIEF COMPLAINT:  No chief complaint on file.   HPI:   Courtney Coleman is a 55 y.o. female with history of atrial fibrillation (xeralto), HTN, CHF and recently diagnosed breast cancer (biopsy proven on arimidex) who presented on 03/27/2020 with right sided weakness and mild confusion.  Personally reviewed hospitalization pertinent progress notes, lab work and imaging with summary provided.  Evaluated by Dr. Erlinda Hong with stroke work-up revealing acute ischemia within the dorsal medial left thalamus and left cerebral peduncle as well as small foci in the splenium of the corpus callosum and left BG, embolic likely from A. fib on Xarelto.  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  Known A. Fib s/p cardioversion on Xarelto PTA but given Xarelto failure, recommend switching to Eliquis 5 mg twice daily for secondary stroke prevention.  Recently diagnosed (01/03/2020) breast cancer per biopsy and advised to establish care with oncology for outpatient follow-up ASAP as previously living in [place].  History of HTN and CHF on Coreg and Entresto with recommended avoidance of hypotension due to left PCA stenosis.  LDL 129 and initiate atorvastatin 40 mg daily.  A1c 7.2 with new diagnosis of DM.  Other stroke risk factors include prior EtOH and drug use but no prior stroke history.  Evaluated by therapy and recommended discharge to CIR for ongoing therapy needs for residual global aphasia, right inattention, right-sided weakness and functional decline.  She was admitted to CIR on 04/01/2020.  Stroke: Acute ischemia within the dorsomedial left thalamus and left cerebral peduncle. There are also small foci in the  splenium of the corpus callosum and the left basal ganglia - embolic - likely from afib  CT head - No evidence of acute intracranial hemorrhage or infarct.   MRI head - Multifocal acute ischemia within the dorsomedial left thalamus and left cerebral peduncle. 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 Otherwise normal MRA of the head and neck.   2D Echo - EF 60-65%, LA severely dilated  [name] - negative  LDL - 129  HgbA1c - 7.2  UDS - negative  VTE prophylaxis - Xarelto  Xarelto (rivaroxaban) daily prior to admission, given the Xarelto failure, switched to eliquis 5 bid  Patient counseled to be compliant with her antithrombotic medications  Ongoing aggressive stroke risk factor management  Therapy recommendations:  CIR   Disposition:  Pending  Today, 06/03/2020, [name] is being seen for hospital follow-up.  She was discharged home from CIR on *** after a *** day stay.  Therapies recommended HH therapy but unfortunately did not due to lack of insurance.  Continues to follow closely with oncology receiving chemo for breast cancer with plans on completing 6 cycles and then pursuing mastectomy followed by radiation in the future.     ROS:   14 system review of systems performed and negative with exception of ***  PMH:  Past Medical History:  Diagnosis Date  . Breast cancer ([place])   . CHF (congestive heart failure) ([place])   .  Dysrhythmia   . Hypertension     PSH:  Past Surgical History:  Procedure Laterality Date  . BREAST BIOPSY    . IR IMAGING GUIDED PORT INSERTION  05/06/2020    Social History:  Social History   Socioeconomic History  . Marital status: Single    Spouse name: Not on file  . Number of children: Not on file  . Years of education: Not on file  . Highest education level: Not on file   Occupational History  . Not on file  Tobacco Use  . Smoking status: Never Smoker  . Smokeless tobacco: Never Used  Substance and Sexual Activity  . Alcohol use: Not Currently  . Drug use: Never  . Sexual activity: Not on file  Other Topics Concern  . Not on file  Social History Narrative  . Not on file   Social Determinants of Health   Financial Resource Strain: Not on file  Food Insecurity: Not on file  Transportation Needs: Not on file  Physical Activity: Not on file  Stress: Not on file  Social Connections: Not on file  Intimate Partner Violence: Not on file    Family History:  Family History  Problem Relation Age of Onset  . Heart disease Mother   . Diabetes Father   . Cancer Neg Hx     Medications:   Current Outpatient Medications on File Prior to Visit  Medication Sig Dispense Refill  . amiodarone (PACERONE) 100 MG tablet Take 1 tablet (100 mg total) by mouth daily. 30 tablet 0  . amiodarone (PACERONE) 200 MG tablet Take 0.5 tablets (100 mg total) by mouth daily. 30 tablet 0  . amLODipine (NORVASC) 5 MG tablet Take 1.5 tablets (7.5 mg total) by mouth daily. 45 tablet 0  . anastrozole (ARIMIDEX) 1 MG tablet Take 1 tablet (1 mg total) by mouth daily. 30 tablet 0  . apixaban (ELIQUIS) 5 MG TABS tablet Take 1 tablet (5 mg total) by mouth 2 (two) times daily. 60 tablet 0  . atorvastatin (LIPITOR) 40 MG tablet Take 1 tablet (40 mg total) by mouth daily. 30 tablet 0  . dexamethasone (DECADRON) 4 MG tablet Take 1 tablet (4 mg total) by mouth daily. Okay take 1 tablet day before chemo and 1 tablet day after chemo with food 12 tablet 0  . hydrALAZINE (APRESOLINE) 10 MG tablet Take 1 tablet (10 mg total) by mouth every 8 (eight) hours. 90 tablet 0  . lidocaine-prilocaine (EMLA) cream Apply to affected area once 30 g 3  . loperamide (IMODIUM) 2 MG capsule Take 1 capsule (2 mg total) by mouth as needed for diarrhea or loose stools. 60 capsule 1  . ondansetron (ZOFRAN) 8 MG  tablet Take 1 tablet (8 mg total) by mouth 2 (two) times daily as needed (Nausea or vomiting). Start on the third day after chemotherapy. 30 tablet 1  . polyethylene glycol (MIRALAX / GLYCOLAX) 17 g packet Take 17 g by mouth 2 (two) times daily. 60 each 0  . polyethylene glycol powder (GLYCOLAX/MIRALAX) 17 GM/SCOOP powder SMARTSIG:1 Capful(s) By Mouth Twice Daily    . prochlorperazine (COMPAZINE) 10 MG tablet Take 1 tablet (10 mg total) by mouth every 6 (six) hours as needed (Nausea or vomiting). 30 tablet 1  . sacubitril-valsartan (ENTRESTO) 49-51 MG Take 1 tablet by mouth 2 (two) times daily. 60 tablet 0   No current facility-administered medications on file prior to visit.    Allergies:  No Known Allergies  OBJECTIVE:  Physical Exam  There were no vitals filed for this visit. There is no height or weight on file to calculate BMI. No exam data present  No flowsheet data found.   General: well developed, well nourished, seated, in no evident distress Head: head normocephalic and atraumatic.   Neck: supple with no carotid or supraclavicular bruits Cardiovascular: regular rate and rhythm, no murmurs Musculoskeletal: no deformity Skin:  no rash/petichiae Vascular:  Normal pulses all extremities   Neurologic Exam Mental Status: Awake and fully alert. Oriented to place and time. Recent and remote memory intact. Attention span, concentration and fund of knowledge appropriate. Mood and affect appropriate.  Cranial Nerves: Fundoscopic exam reveals sharp disc margins. Pupils equal, briskly reactive to light. Extraocular movements full without nystagmus. Visual fields full to confrontation. Hearing intact. Facial sensation intact. Face, tongue, palate moves normally and symmetrically.  Motor: Normal bulk and tone. Normal strength in all tested extremity muscles.   Dlt Bic Tri FgS Grp HF  KnF KnE PIF DoF  R 5 5 5 5 5 5 5 5 5 5   L 5 5 5 5 5 5 5 5 5 5   Sensory.: intact to touch , pinprick  , position and vibratory sensation.  Coordination: Rapid alternating movements normal in all extremities. Finger-to-nose and heel-to-shin performed accurately bilaterally. Gait and Station: Arises from chair without difficulty. Stance is normal. Gait demonstrates normal stride length and balance Reflexes: 1+ and symmetric. Toes downgoing.     NIHSS  *** Modified Rankin  ***      ASSESSMENT: Milton Streicher is a 55 y.o. year old female presented with right-sided weakness and mild confusion on 03/27/2020 with stroke work-up revealing acute ischemia within the dorsal medial left thalamus and left cerebral peduncle as well as small foci in the splenium of the corpus callosum and basal ganglia, embolic secondary to known AF on Xarelto therefore switched to Eliquis. Vascular risk factors include new breast cancer diagnosis, atrial fibrillation s/p cardioversion, HTN, CHF, HLD, new dx of DM, and history of EtOH and drug use.57      PLAN:  1. Left embolic stroke: Residual deficit: ***. Continue Eliquis (apixaban) daily  and atorvastatin 40 mg daily for secondary stroke prevention.  Discussed secondary stroke prevention measures and importance of close PCP follow up for aggressive stroke risk factor management  2. Atrial fibrillation: 3. HTN: BP goal <130/90.  Stable on *** per PCP 4. HLD: LDL goal <70. Recent LDL 129 therefore initiated atorvastatin 40 mg daily.  5. New dx DMII: A1c goal<7.0. Recent A1c 7.2.     Follow up in *** or call earlier if needed   I spent *** minutes of face-to-face and non-face-to-face time with patient.  This included previsit chart review, lab review, study review, order entry, electronic health record documentation, patient education regarding recent stroke, residual deficits, importance of managing stroke risk factors and answered all questions to patient satisfaction     03/29/2020, [facility]  [facility] [address] [place], [address] [place]  Phone [phone] Fax [phone] Note: This document was prepared with digital dictation and possible smart phrase technology. Any transcriptional errors that result from this process are unintentional.

## 2020-06-03 NOTE — Progress Notes (Signed)
Patient Care Team: Kerin Perna, NP as PCP - General (Internal Medicine) Mauro Kaufmann, RN as Oncology Nurse Navigator Rockwell Germany, RN as Oncology Nurse Navigator  DIAGNOSIS:    ICD-10-CM   1. 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   05/15/2020 -  Chemotherapy   The patient had dexamethasone (DECADRON) 4 MG tablet, 4 mg (100 % of original dose 4 mg), Oral, Daily, 1 of 1 cycle, Start date: 04/28/2020, End date: -- Dose modification: 4 mg (original dose 4 mg, Cycle 0) palonosetron (ALOXI) injection 0.25 mg, 0.25 mg, Intravenous,  Once, 1 of 6 cycles Administration: 0.25 mg (05/15/2020) pegfilgrastim-cbqv (UDENYCA) injection 6 mg, 6 mg, Subcutaneous, Once, 1 of 6 cycles Administration: 6 mg (05/18/2020) trastuzumab (HERCEPTIN) 651 mg in sodium chloride 0.9 % 250 mL chemo infusion, 6 mg/kg = 651 mg, Intravenous,  Once, 0 of 5 cycles CARBOplatin (PARAPLATIN) 700 mg in sodium chloride 0.9 % 250 mL chemo infusion, 700 mg (100 % of original dose 700 mg), Intravenous,  Once, 1 of 6 cycles Dose modification: 700 mg (original dose 700 mg, Cycle 1), 700 mg (original dose 700 mg, Cycle 3) Administration: 700 mg (05/15/2020) DOCEtaxel (TAXOTERE) 170 mg in sodium chloride 0.9 % 250 mL chemo infusion, 75 mg/m2 = 170 mg, Intravenous,  Once, 1 of 6 cycles Dose  modification: 60 mg/m2 (original dose 75 mg/m2, Cycle 2, Reason: Provider Judgment) Administration: 170 mg (05/15/2020) fosaprepitant (EMEND) 150 mg in sodium chloride 0.9 % 145 mL IVPB, 150 mg, Intravenous,  Once, 1 of 6 cycles Administration: 150 mg (05/15/2020) pertuzumab (PERJETA) 420 mg in sodium chloride 0.9 % 250 mL chemo infusion, 420 mg (100 % of original dose 420 mg), Intravenous, Once, 1 of 6 cycles Dose modification: 420 mg (original dose 420 mg, Cycle 1, Reason: Provider Judgment) Administration: 420 mg (05/15/2020) trastuzumab-anns (KANJINTI) 882 mg in sodium chloride 0.9 % 250 mL chemo infusion, 8 mg/kg = 882 mg, Intravenous,  Once, 1 of 1 cycle Administration: 882 mg (05/15/2020)  for chemotherapy treatment.      CHIEF COMPLIANT: Cycle 2 Day 1 [facility] Perjeta  INTERVAL HISTORY: Lametria Guyett is a 55 y.o. with above-mentioned history of left breast cancercurrently on neoadjuvant chemotherapy with [place].She presents to the clinic todayfora toxicity check following cycle 2. diarrhea is under good control with Imodium.  She previously was living with her niece and sister.  Unfortunately they were not helping her and so she had to move to another family member in [place].  [place] have decided to [place] her treatments for the cycle.  ALLERGIES:  has No Known Allergies.  MEDICATIONS:  Current Outpatient Medications  Medication Sig Dispense Refill  . amiodarone (PACERONE) 100 MG tablet Take 1 tablet (100 mg total) by mouth daily. 30 tablet 0  . amiodarone (PACERONE) 200 MG tablet  Take 0.5 tablets (100 mg total) by mouth daily. 30 tablet 0  . amLODipine (NORVASC) 5 MG tablet Take 1.5 tablets (7.5 mg total) by mouth daily. 45 tablet 0  . anastrozole (ARIMIDEX) 1 MG tablet Take 1 tablet (1 mg total) by mouth daily. 30 tablet 0  . apixaban (ELIQUIS) 5 MG TABS tablet Take 1 tablet (5 mg total) by mouth 2 (two) times daily. 60 tablet 0  .  atorvastatin (LIPITOR) 40 MG tablet Take 1 tablet (40 mg total) by mouth daily. 30 tablet 0  . dexamethasone (DECADRON) 4 MG tablet Take 1 tablet (4 mg total) by mouth daily. Okay take 1 tablet day before chemo and 1 tablet day after chemo with food 12 tablet 0  . hydrALAZINE (APRESOLINE) 10 MG tablet Take 1 tablet (10 mg total) by mouth every 8 (eight) hours. 90 tablet 0  . lidocaine-prilocaine (EMLA) cream Apply to affected area once 30 g 3  . loperamide (IMODIUM) 2 MG capsule Take 1 capsule (2 mg total) by mouth as needed for diarrhea or loose stools. 60 capsule 1  . ondansetron (ZOFRAN) 8 MG tablet Take 1 tablet (8 mg total) by mouth 2 (two) times daily as needed (Nausea or vomiting). Start on the third day after chemotherapy. 30 tablet 1  . polyethylene glycol (MIRALAX / GLYCOLAX) 17 g packet Take 17 g by mouth 2 (two) times daily. 60 each 0  . polyethylene glycol powder (GLYCOLAX/MIRALAX) 17 GM/SCOOP powder SMARTSIG:1 Capful(s) By Mouth Twice Daily    . prochlorperazine (COMPAZINE) 10 MG tablet Take 1 tablet (10 mg total) by mouth every 6 (six) hours as needed (Nausea or vomiting). 30 tablet 1  . sacubitril-valsartan (ENTRESTO) 49-51 MG Take 1 tablet by mouth 2 (two) times daily. 60 tablet 0   No current facility-administered medications for this visit.    PHYSICAL EXAMINATION: ECOG PERFORMANCE STATUS: 1 - Symptomatic but completely ambulatory  Vitals:   06/04/20 0803  BP: (!) 150/72  Pulse: 70  Resp: 18  Temp: 98 F (36.7 C)  SpO2: 99%   Filed Weights   06/04/20 0803  Weight: 234 lb 14.4 oz (106.5 kg)    LABORATORY DATA:  I have reviewed the data as listed CMP Latest Ref Rng & Units 05/21/2020 05/14/2020 04/20/2020  Glucose 70 - 99 mg/dL 111(H) 122(H) 139(H)  BUN 6 - 20 mg/dL _0 Creatinine 0.44 - 1.00 mg/dL 0.69 0.71 0.74  Sodium 135 - 145 mmol/L 141 141 138  Potassium 3.5 - 5.1 mmol/L 3.4(L) 3.5 3.5  Chloride 98 - 111 mmol/L 107 107 104  CO2 22 - 32 mmol/L _1 Calcium 8.9 - 10.3 mg/dL 8.9 9.0 9.2  Total Protein 6.5 - 8.1 g/dL 7.8 7.7 -  Total Bilirubin 0.3 - 1.2 mg/dL 1.0 0.7 -  Alkaline Phos 38 - 126 U/L 95 92 -  AST 15 - 41 U/L 19 20 -  ALT 0 - 44 U/L 21 14 -    Lab Results  Component Value Date   WBC 1.6 (L) 05/21/2020   HGB 10.6 (L) 05/21/2020   HCT 33.2 (L) 05/21/2020   MCV 85.6 05/21/2020   PLT 163 05/21/2020   NEUTROABS 0.2 (LL) 05/21/2020    ASSESSMENT & PLAN:  Malignant neoplasm of central portion of left breast in female, estrogen receptor positive ([place]) Palpable left breast mass started February 2021 (patient lives in the [place]), went to [place] and mammogram and ultrasound 01/03/2020: 8 cm  distortion and calcifications left breast 2:00 and 3 abnormal axillary lymph nodes. 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  Treatment Plan:: 1. Neoadjuvant chemotherapy with [place] Perjeta 6 cycles followed by HerceptinPerjeta versus Kadcylamaintenance for 1 year 2. Followed bymastectomy with the targeted node dissection 3. Followed by adjuvant radiation therapy 4.Followed by antiestrogen therapy and neratinib -------------------------------------------------------------------------------------------------------------------------------- Current Treatment: Cycle 2 [facility]CHP  ECHO 03/28/20: EF 60-65%  Chemo toxicities: 1.  Diarrhea: I instructed her to take Imodium.  I sent a prescription to [facility]. 2. mild nausea 3.  Leukopenia/neutropenia: We reduced the dosage of cycle 2.  Social issues: Patient is no longer staying with her niece and sister.  We will contact her social worker to see if she has any options locally to stay during her treatment. Return to clinic in 3 weeks with cycle 3    No orders of the defined types were placed in this encounter.  The patient has a good understanding of the overall plan. she agrees with it. she will call with any problems that may develop  before the next visit here.  Total time spent: 30 mins including face to face time and time spent for planning, charting and coordination of care  Nicholas Lose, MD 06/04/2020  I, Cloyde Reams Dorshimer, am acting as scribe for Dr. Nicholas Lose.  I have reviewed the above documentation for accuracy and completeness, and I agree with the above.

## 2020-06-04 ENCOUNTER — Inpatient Hospital Stay: Payer: Self-pay

## 2020-06-04 ENCOUNTER — Other Ambulatory Visit: Payer: Self-pay

## 2020-06-04 ENCOUNTER — Encounter: Payer: Self-pay | Admitting: Licensed Clinical Social Worker

## 2020-06-04 ENCOUNTER — Inpatient Hospital Stay: Payer: Self-pay | Attending: Hematology and Oncology

## 2020-06-04 ENCOUNTER — Inpatient Hospital Stay (HOSPITAL_BASED_OUTPATIENT_CLINIC_OR_DEPARTMENT_OTHER): Payer: No Typology Code available for payment source | Admitting: Hematology and Oncology

## 2020-06-04 DIAGNOSIS — Z170 Estrogen receptor positive status [ER+]: Secondary | ICD-10-CM

## 2020-06-04 DIAGNOSIS — Z79899 Other long term (current) drug therapy: Secondary | ICD-10-CM | POA: Insufficient documentation

## 2020-06-04 DIAGNOSIS — C50112 Malignant neoplasm of central portion of left female breast: Secondary | ICD-10-CM

## 2020-06-04 DIAGNOSIS — Z5112 Encounter for antineoplastic immunotherapy: Secondary | ICD-10-CM | POA: Insufficient documentation

## 2020-06-04 DIAGNOSIS — Z95828 Presence of other vascular implants and grafts: Secondary | ICD-10-CM

## 2020-06-04 DIAGNOSIS — Z5189 Encounter for other specified aftercare: Secondary | ICD-10-CM | POA: Insufficient documentation

## 2020-06-04 DIAGNOSIS — Z5111 Encounter for antineoplastic chemotherapy: Secondary | ICD-10-CM | POA: Insufficient documentation

## 2020-06-04 LAB — CBC WITH DIFFERENTIAL (CANCER CENTER ONLY)
Abs Immature Granulocytes: 0.04 10*3/uL (ref 0.00–0.07)
Basophils Absolute: 0.1 10*3/uL (ref 0.0–0.1)
Basophils Relative: 1 %
Eosinophils Absolute: 0 10*3/uL (ref 0.0–0.5)
Eosinophils Relative: 0 %
HCT: 31.4 % — ABNORMAL LOW (ref 36.0–46.0)
Hemoglobin: 9.9 g/dL — ABNORMAL LOW (ref 12.0–15.0)
Immature Granulocytes: 0 %
Lymphocytes Relative: 21 %
Lymphs Abs: 1.9 10*3/uL (ref 0.7–4.0)
MCH: 27 pg (ref 26.0–34.0)
MCHC: 31.5 g/dL (ref 30.0–36.0)
MCV: 85.8 fL (ref 80.0–100.0)
Monocytes Absolute: 0.5 10*3/uL (ref 0.1–1.0)
Monocytes Relative: 5 %
Neutro Abs: 6.7 10*3/uL (ref 1.7–7.7)
Neutrophils Relative %: 73 %
Platelet Count: 283 10*3/uL (ref 150–400)
RBC: 3.66 MIL/uL — ABNORMAL LOW (ref 3.87–5.11)
RDW: 21.2 % — ABNORMAL HIGH (ref 11.5–15.5)
WBC Count: 9.2 10*3/uL (ref 4.0–10.5)
nRBC: 0 % (ref 0.0–0.2)

## 2020-06-04 LAB — CMP (CANCER CENTER ONLY)
ALT: 14 U/L (ref 0–44)
AST: 17 U/L (ref 15–41)
Albumin: 3.2 g/dL — ABNORMAL LOW (ref 3.5–5.0)
Alkaline Phosphatase: 81 U/L (ref 38–126)
Anion gap: 7 (ref 5–15)
BUN: 10 mg/dL (ref 6–20)
CO2: 28 mmol/L (ref 22–32)
Calcium: 9 mg/dL (ref 8.9–10.3)
Chloride: 107 mmol/L (ref 98–111)
Creatinine: 0.7 mg/dL (ref 0.44–1.00)
GFR, Estimated: >60 mL/min (ref >60)
Glucose, Bld: 140 mg/dL — ABNORMAL HIGH (ref 70–99)
Potassium: 3.3 mmol/L — ABNORMAL LOW (ref 3.5–5.1)
Sodium: 142 mmol/L (ref 135–145)
Total Bilirubin: 0.5 mg/dL (ref 0.3–1.2)
Total Protein: 7.2 g/dL (ref 6.5–8.1)

## 2020-06-04 MED ORDER — TRASTUZUMAB CHEMO 150 MG IV SOLR
600.0000 mg | Freq: Once | INTRAVENOUS | Status: AC
  Administered 2020-06-04: 600 mg via INTRAVENOUS
  Filled 2020-06-04: qty 28.57

## 2020-06-04 MED ORDER — SODIUM CHLORIDE 0.9% FLUSH
10.0000 mL | Freq: Once | INTRAVENOUS | Status: AC
  Administered 2020-06-04: 10 mL
  Filled 2020-06-04: qty 10

## 2020-06-04 MED ORDER — DIPHENHYDRAMINE HCL 50 MG/ML IJ SOLN
INTRAMUSCULAR | Status: AC
  Filled 2020-06-04: qty 1

## 2020-06-04 MED ORDER — SODIUM CHLORIDE 0.9% FLUSH
10.0000 mL | INTRAVENOUS | Status: DC | PRN
  Administered 2020-06-04: 10 mL
  Filled 2020-06-04: qty 10

## 2020-06-04 MED ORDER — PALONOSETRON HCL INJECTION 0.25 MG/5ML
INTRAVENOUS | Status: AC
  Filled 2020-06-04: qty 5

## 2020-06-04 MED ORDER — SODIUM CHLORIDE 0.9 % IV SOLN
10.0000 mg | Freq: Once | INTRAVENOUS | Status: AC
  Administered 2020-06-04: 10 mg via INTRAVENOUS
  Filled 2020-06-04: qty 10

## 2020-06-04 MED ORDER — SODIUM CHLORIDE 0.9 % IV SOLN
Freq: Once | INTRAVENOUS | Status: AC
  Filled 2020-06-04: qty 250

## 2020-06-04 MED ORDER — PALONOSETRON HCL INJECTION 0.25 MG/5ML
0.2500 mg | Freq: Once | INTRAVENOUS | Status: AC
  Administered 2020-06-04: 0.25 mg via INTRAVENOUS

## 2020-06-04 MED ORDER — ACETAMINOPHEN 325 MG PO TABS
650.0000 mg | ORAL_TABLET | Freq: Once | ORAL | Status: AC
  Administered 2020-06-04: 650 mg via ORAL

## 2020-06-04 MED ORDER — SODIUM CHLORIDE 0.9 % IV SOLN
420.0000 mg | Freq: Once | INTRAVENOUS | Status: AC
  Administered 2020-06-04: 420 mg via INTRAVENOUS
  Filled 2020-06-04: qty 14

## 2020-06-04 MED ORDER — SODIUM CHLORIDE 0.9 % IV SOLN
150.0000 mg | Freq: Once | INTRAVENOUS | Status: AC
  Administered 2020-06-04: 150 mg via INTRAVENOUS
  Filled 2020-06-04: qty 150

## 2020-06-04 MED ORDER — DIPHENHYDRAMINE HCL 25 MG PO CAPS
50.0000 mg | ORAL_CAPSULE | Freq: Once | ORAL | Status: AC
  Administered 2020-06-04: 50 mg via ORAL

## 2020-06-04 MED ORDER — DIPHENHYDRAMINE HCL 25 MG PO CAPS
ORAL_CAPSULE | ORAL | Status: AC
  Filled 2020-06-04: qty 2

## 2020-06-04 MED ORDER — SODIUM CHLORIDE 0.9 % IV SOLN
60.0000 mg/m2 | Freq: Once | INTRAVENOUS | Status: AC
  Administered 2020-06-04: 140 mg via INTRAVENOUS
  Filled 2020-06-04: qty 14

## 2020-06-04 MED ORDER — HEPARIN SOD (PORK) LOCK FLUSH 100 UNIT/ML IV SOLN
500.0000 [IU] | Freq: Once | INTRAVENOUS | Status: AC | PRN
  Administered 2020-06-04: 500 [IU]
  Filled 2020-06-04: qty 5

## 2020-06-04 MED ORDER — CARBOPLATIN CHEMO INJECTION 600 MG/60ML
700.0000 mg | Freq: Once | INTRAVENOUS | Status: AC
Start: 2020-06-04 — End: 2020-06-04
  Administered 2020-06-04: 700 mg via INTRAVENOUS
  Filled 2020-06-04: qty 70

## 2020-06-04 MED ORDER — ACETAMINOPHEN 325 MG PO TABS
ORAL_TABLET | ORAL | Status: AC
  Filled 2020-06-04: qty 2

## 2020-06-04 NOTE — Assessment & Plan Note (Signed)
Palpable left breast mass started February 2021 (patient lives in the [place]), went to [place] and mammogram and ultrasound 01/03/2020: 8 cm distortion and calcifications left breast 2:00 and 3 abnormal axillary lymph nodes. 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  Treatment Plan:: 1. Neoadjuvant chemotherapy with [place] Perjeta 6 cycles followed by HerceptinPerjeta versus Kadcylamaintenance for 1 year 2. Followed bymastectomy with the targeted node dissection 3. Followed by adjuvant radiation therapy 4.Followed by antiestrogen therapy and neratinib -------------------------------------------------------------------------------------------------------------------------------- Current Treatment: Cycle 2 TCHP  ECHO 03/28/20: EF 60-65%  Chemo toxicities: 1.  Diarrhea: I instructed her to take Imodium.  I sent a prescription to [facility]. 2. mild nausea 3.  Leukopenia/neutropenia: We will reduce the dosage of cycle 2.  Return to clinic in 3 weeks with cycle 3

## 2020-06-04 NOTE — Patient Instructions (Signed)
[facility] Discharge Instructions for Patients Receiving Chemotherapy  Today you received the following chemotherapy agents herceptin/perjeta/taxotere/carboplatin   To help prevent nausea and vomiting after your treatment, we encourage you to take your nausea medication as directed  If you develop nausea and vomiting that is not controlled by your nausea medication, call the clinic.   BELOW ARE SYMPTOMS THAT SHOULD BE REPORTED IMMEDIATELY:  *FEVER GREATER THAN 100.5 F  *CHILLS WITH OR WITHOUT FEVER  NAUSEA AND VOMITING THAT IS NOT CONTROLLED WITH YOUR NAUSEA MEDICATION  *UNUSUAL SHORTNESS OF BREATH  *UNUSUAL BRUISING OR BLEEDING  TENDERNESS IN MOUTH AND THROAT WITH OR WITHOUT PRESENCE OF ULCERS  *URINARY PROBLEMS  *BOWEL PROBLEMS  UNUSUAL RASH Items with * indicate a potential emergency and should be followed up as soon as possible.  Feel free to call the clinic you have any questions or concerns. The clinic phone number is [phone].  Trastuzumab injection for infusion What is this medicine? TRASTUZUMAB (tras TOO zoo mab) is a monoclonal antibody. It is used to treat breast cancer and stomach cancer. This medicine may be used for other purposes; ask your health care provider or pharmacist if you have questions. COMMON BRAND NAME(S): Herceptin, Herzuma, KANJINTI, Ogivri, Ontruzant, Trazimera What should I tell my health care provider before I take this medicine? They need to know if you have any of these conditions:  heart disease  heart failure  lung or breathing disease, like asthma  an unusual or allergic reaction to trastuzumab, benzyl alcohol, or other medications, foods, dyes, or preservatives  pregnant or trying to get pregnant  breast-feeding How should I use this medicine? This drug is given as an infusion into a vein. It is administered in a hospital or clinic by a specially trained health care professional. Talk to your pediatrician  regarding the use of this medicine in children. This medicine is not approved for use in children. Overdosage: If you think you have taken too much of this medicine contact a poison control center or emergency room at once. NOTE: This medicine is only for you. Do not share this medicine with others. What if I miss a dose? It is important not to miss a dose. Call your doctor or health care professional if you are unable to keep an appointment. What may interact with this medicine? This medicine may interact with the following medications:  certain types of chemotherapy, such as daunorubicin, doxorubicin, epirubicin, and idarubicin This list may not describe all possible interactions. Give your health care provider a list of all the medicines, herbs, non-prescription drugs, or dietary supplements you use. Also tell them if you smoke, drink alcohol, or use illegal drugs. Some items may interact with your medicine. What should I watch for while using this medicine? Visit your doctor for checks on your progress. Report any side effects. Continue your course of treatment even though you feel ill unless your doctor tells you to stop. Call your doctor or health care professional for advice if you get a fever, chills or sore throat, or other symptoms of a cold or flu. Do not treat yourself. Try to avoid being around people who are sick. You may experience fever, chills and shaking during your first infusion. These effects are usually mild and can be treated with other medicines. Report any side effects during the infusion to your health care professional. Fever and chills usually do not happen with later infusions. Do not become pregnant while taking this medicine or for   7 months after stopping it. Women should inform their doctor if they wish to become pregnant or think they might be pregnant. Women of child-bearing potential will need to have a negative pregnancy test before starting this medicine. There is a  potential for serious side effects to an unborn child. Talk to your health care professional or pharmacist for more information. Do not breast-feed an infant while taking this medicine or for 7 months after stopping it. Women must use effective birth control with this medicine. What side effects may I notice from receiving this medicine? Side effects that you should report to your doctor or health care professional as soon as possible:  allergic reactions like skin rash, itching or hives, swelling of the face, lips, or tongue  chest pain or palpitations  cough  dizziness  feeling faint or lightheaded, falls  fever  general ill feeling or flu-like symptoms  signs of worsening heart failure like breathing problems; swelling in your legs and feet  unusually weak or tired Side effects that usually do not require medical attention (report to your doctor or health care professional if they continue or are bothersome):  bone pain  changes in taste  diarrhea  joint pain  nausea/vomiting  weight loss This list may not describe all possible side effects. Call your doctor for medical advice about side effects. You may report side effects to FDA at 1-800-FDA-1088. Where should I keep my medicine? This drug is given in a hospital or clinic and will not be stored at home. NOTE: This sheet is a summary. It may not cover all possible information. If you have questions about this medicine, talk to your doctor, pharmacist, or health care provider.  2020 Elsevier/Gold Standard (2016-05-10 14:37:52)  Pertuzumab injection What is this medicine? PERTUZUMAB (per TOOZ ue mab) is a monoclonal antibody. It is used to treat breast cancer. This medicine may be used for other purposes; ask your health care provider or pharmacist if you have questions. COMMON BRAND NAME(S): PERJETA What should I tell my health care provider before I take this medicine? They need to know if you have any of these  conditions:  heart disease  heart failure  high blood pressure  history of irregular heart beat  recent or ongoing radiation therapy  an unusual or allergic reaction to pertuzumab, other medicines, foods, dyes, or preservatives  pregnant or trying to get pregnant  breast-feeding How should I use this medicine? This medicine is for infusion into a vein. It is given by a health care professional in a hospital or clinic setting. Talk to your pediatrician regarding the use of this medicine in children. Special care may be needed. Overdosage: If you think you have taken too much of this medicine contact a poison control center or emergency room at once. NOTE: This medicine is only for you. Do not share this medicine with others. What if I miss a dose? It is important not to miss your dose. Call your doctor or health care professional if you are unable to keep an appointment. What may interact with this medicine? Interactions are not expected. Give your health care provider a list of all the medicines, herbs, non-prescription drugs, or dietary supplements you use. Also tell them if you smoke, drink alcohol, or use illegal drugs. Some items may interact with your medicine. This list may not describe all possible interactions. Give your health care provider a list of all the medicines, herbs, non-prescription drugs, or dietary supplements you use. Also   tell them if you smoke, drink alcohol, or use illegal drugs. Some items may interact with your medicine. What should I watch for while using this medicine? Your condition will be monitored carefully while you are receiving this medicine. Report any side effects. Continue your course of treatment even though you feel ill unless your doctor tells you to stop. Do not become pregnant while taking this medicine or for 7 months after stopping it. Women should inform their doctor if they wish to become pregnant or think they might be pregnant. Women of  child-bearing potential will need to have a negative pregnancy test before starting this medicine. There is a potential for serious side effects to an unborn child. Talk to your health care professional or pharmacist for more information. Do not breast-feed an infant while taking this medicine or for 7 months after stopping it. Women must use effective birth control with this medicine. Call your doctor or health care professional for advice if you get a fever, chills or sore throat, or other symptoms of a cold or flu. Do not treat yourself. Try to avoid being around people who are sick. You may experience fever, chills, and headache during the infusion. Report any side effects during the infusion to your health care professional. What side effects may I notice from receiving this medicine? Side effects that you should report to your doctor or health care professional as soon as possible:  breathing problems  chest pain or palpitations  dizziness  feeling faint or lightheaded  fever or chills  skin rash, itching or hives  sore throat  swelling of the face, lips, or tongue  swelling of the legs or ankles  unusually weak or tired Side effects that usually do not require medical attention (report to your doctor or health care professional if they continue or are bothersome):  diarrhea  hair loss  nausea, vomiting  tiredness This list may not describe all possible side effects. Call your doctor for medical advice about side effects. You may report side effects to FDA at 1-800-FDA-1088. Where should I keep my medicine? This drug is given in a hospital or clinic and will not be stored at home. NOTE: This sheet is a summary. It may not cover all possible information. If you have questions about this medicine, talk to your doctor, pharmacist, or health care provider.  2020 Elsevier/Gold Standard (2015-06-18 12:08:50)  Docetaxel injection What is this medicine? DOCETAXEL (doe se TAX  el) is a chemotherapy drug. It targets fast dividing cells, like cancer cells, and causes these cells to die. This medicine is used to treat many types of cancers like breast cancer, certain stomach cancers, head and neck cancer, lung cancer, and prostate cancer. This medicine may be used for other purposes; ask your health care provider or pharmacist if you have questions. COMMON BRAND NAME(S): Docefrez, Taxotere What should I tell my health care provider before I take this medicine? They need to know if you have any of these conditions:  infection (especially a virus infection such as chickenpox, cold sores, or herpes)  liver disease  low blood counts, like low white cell, platelet, or red cell counts  an unusual or allergic reaction to docetaxel, polysorbate 80, other chemotherapy agents, other medicines, foods, dyes, or preservatives  pregnant or trying to get pregnant  breast-feeding How should I use this medicine? This drug is given as an infusion into a vein. It is administered in a hospital or clinic by a specially trained health   care professional. Talk to your pediatrician regarding the use of this medicine in children. Special care may be needed. Overdosage: If you think you have taken too much of this medicine contact a poison control center or emergency room at once. NOTE: This medicine is only for you. Do not share this medicine with others. What if I miss a dose? It is important not to miss your dose. Call your doctor or health care professional if you are unable to keep an appointment. What may interact with this medicine?  aprepitant  certain antibiotics like erythromycin or clarithromycin  certain antivirals for HIV or hepatitis  certain medicines for fungal infections like fluconazole, itraconazole, ketoconazole, posaconazole, or voriconazole  cimetidine  ciprofloxacin  conivaptan  cyclosporine  dronedarone  fluvoxamine  grapefruit  juice  imatinib  verapamil This list may not describe all possible interactions. Give your health care provider a list of all the medicines, herbs, non-prescription drugs, or dietary supplements you use. Also tell them if you smoke, drink alcohol, or use illegal drugs. Some items may interact with your medicine. What should I watch for while using this medicine? Your condition will be monitored carefully while you are receiving this medicine. You will need important blood work done while you are taking this medicine. Call your doctor or health care professional for advice if you get a fever, chills or sore throat, or other symptoms of a cold or flu. Do not treat yourself. This drug decreases your body's ability to fight infections. Try to avoid being around people who are sick. Some products may contain alcohol. Ask your health care professional if this medicine contains alcohol. Be sure to tell all health care professionals you are taking this medicine. Certain medicines, like metronidazole and disulfiram, can cause an unpleasant reaction when taken with alcohol. The reaction includes flushing, headache, nausea, vomiting, sweating, and increased thirst. The reaction can last from 30 minutes to several hours. You may get drowsy or dizzy. Do not drive, use machinery, or do anything that needs mental alertness until you know how this medicine affects you. Do not stand or sit up quickly, especially if you are an older patient. This reduces the risk of dizzy or fainting spells. Alcohol may interfere with the effect of this medicine. Talk to your health care professional about your risk of cancer. You may be more at risk for certain types of cancer if you take this medicine. Do not become pregnant while taking this medicine or for 6 months after stopping it. Women should inform their doctor if they wish to become pregnant or think they might be pregnant. There is a potential for serious side effects to an  unborn child. Talk to your health care professional or pharmacist for more information. Do not breast-feed an infant while taking this medicine or for 1 week after stopping it. Males who get this medicine must use a condom during sex with females who can get pregnant. If you get a woman pregnant, the baby could have birth defects. The baby could die before they are born. You will need to continue wearing a condom for 3 months after stopping the medicine. Tell your health care provider right away if your partner becomes pregnant while you are taking this medicine. This may interfere with the ability to father a child. You should talk to your doctor or health care professional if you are concerned about your fertility. What side effects may I notice from receiving this medicine? Side effects that you should   report to your doctor or health care professional as soon as possible:  allergic reactions like skin rash, itching or hives, swelling of the face, lips, or tongue  blurred vision  breathing problems  changes in vision  low blood counts - This drug may decrease the number of white blood cells, red blood cells and platelets. You may be at increased risk for infections and bleeding.  nausea and vomiting  pain, redness or irritation at site where injected  pain, tingling, numbness in the hands or feet  redness, blistering, peeling, or loosening of the skin, including inside the mouth  signs of decreased platelets or bleeding - bruising, pinpoint red spots on the skin, black, tarry stools, nosebleeds  signs of decreased red blood cells - unusually weak or tired, fainting spells, lightheadedness  signs of infection - fever or chills, cough, sore throat, pain or difficulty passing urine  swelling of the ankle, feet, hands Side effects that usually do not require medical attention (report to your doctor or health care professional if they continue or are  bothersome):  constipation  diarrhea  fingernail or toenail changes  hair loss  loss of appetite  mouth sores  muscle pain This list may not describe all possible side effects. Call your doctor for medical advice about side effects. You may report side effects to FDA at 1-800-FDA-1088. Where should I keep my medicine? This drug is given in a hospital or clinic and will not be stored at home. NOTE: This sheet is a summary. It may not cover all possible information. If you have questions about this medicine, talk to your doctor, pharmacist, or health care provider.  2020 Elsevier/Gold Standard (2019-01-10 10:19:06)  Carboplatin injection What is this medicine? CARBOPLATIN (KAR boe pla tin) is a chemotherapy drug. It targets fast dividing cells, like cancer cells, and causes these cells to die. This medicine is used to treat ovarian cancer and many other cancers. This medicine may be used for other purposes; ask your health care provider or pharmacist if you have questions. COMMON BRAND NAME(S): Paraplatin What should I tell my health care provider before I take this medicine? They need to know if you have any of these conditions:  blood disorders  hearing problems  kidney disease  recent or ongoing radiation therapy  an unusual or allergic reaction to carboplatin, cisplatin, other chemotherapy, other medicines, foods, dyes, or preservatives  pregnant or trying to get pregnant  breast-feeding How should I use this medicine? This drug is usually given as an infusion into a vein. It is administered in a hospital or clinic by a specially trained health care professional. Talk to your pediatrician regarding the use of this medicine in children. Special care may be needed. Overdosage: If you think you have taken too much of this medicine contact a poison control center or emergency room at once. NOTE: This medicine is only for you. Do not share this medicine with others. What  if I miss a dose? It is important not to miss a dose. Call your doctor or health care professional if you are unable to keep an appointment. What may interact with this medicine?  medicines for seizures  medicines to increase blood counts like filgrastim, pegfilgrastim, sargramostim  some antibiotics like amikacin, gentamicin, neomycin, streptomycin, tobramycin  vaccines Talk to your doctor or health care professional before taking any of these medicines:  acetaminophen  aspirin  ibuprofen  ketoprofen  naproxen This list may not describe all possible interactions. Give your   health care provider a list of all the medicines, herbs, non-prescription drugs, or dietary supplements you use. Also tell them if you smoke, drink alcohol, or use illegal drugs. Some items may interact with your medicine. What should I watch for while using this medicine? Your condition will be monitored carefully while you are receiving this medicine. You will need important blood work done while you are taking this medicine. This drug may make you feel generally unwell. This is not uncommon, as chemotherapy can affect healthy cells as well as cancer cells. Report any side effects. Continue your course of treatment even though you feel ill unless your doctor tells you to stop. In some cases, you may be given additional medicines to help with side effects. Follow all directions for their use. Call your doctor or health care professional for advice if you get a fever, chills or sore throat, or other symptoms of a cold or flu. Do not treat yourself. This drug decreases your body's ability to fight infections. Try to avoid being around people who are sick. This medicine may increase your risk to bruise or bleed. Call your doctor or health care professional if you notice any unusual bleeding. Be careful brushing and flossing your teeth or using a toothpick because you may get an infection or bleed more easily. If you  have any dental work done, tell your dentist you are receiving this medicine. Avoid taking products that contain aspirin, acetaminophen, ibuprofen, naproxen, or ketoprofen unless instructed by your doctor. These medicines may hide a fever. Do not become pregnant while taking this medicine. Women should inform their doctor if they wish to become pregnant or think they might be pregnant. There is a potential for serious side effects to an unborn child. Talk to your health care professional or pharmacist for more information. Do not breast-feed an infant while taking this medicine. What side effects may I notice from receiving this medicine? Side effects that you should report to your doctor or health care professional as soon as possible:  allergic reactions like skin rash, itching or hives, swelling of the face, lips, or tongue  signs of infection - fever or chills, cough, sore throat, pain or difficulty passing urine  signs of decreased platelets or bleeding - bruising, pinpoint red spots on the skin, black, tarry stools, nosebleeds  signs of decreased red blood cells - unusually weak or tired, fainting spells, lightheadedness  breathing problems  changes in hearing  changes in vision  chest pain  high blood pressure  low blood counts - This drug may decrease the number of white blood cells, red blood cells and platelets. You may be at increased risk for infections and bleeding.  nausea and vomiting  pain, swelling, redness or irritation at the injection site  pain, tingling, numbness in the hands or feet  problems with balance, talking, walking  trouble passing urine or change in the amount of urine Side effects that usually do not require medical attention (report to your doctor or health care professional if they continue or are bothersome):  hair loss  loss of appetite  metallic taste in the mouth or changes in taste This list may not describe all possible side effects.  Call your doctor for medical advice about side effects. You may report side effects to FDA at 1-800-FDA-1088. Where should I keep my medicine? This drug is given in a hospital or clinic and will not be stored at home. NOTE: This sheet is a summary. It may   not cover all possible information. If you have questions about this medicine, talk to your doctor, pharmacist, or health care provider.  2020 Elsevier/Gold Standard (2007-08-21 14:38:05)     

## 2020-06-04 NOTE — Progress Notes (Signed)
[facility]  Clinical Social Work was referred by Dr. Lindi Adie for assessment of psychosocial needs as patient's housing and living situation has changed.  Clinical Social Worker met with patient  to offer support and assess for needs.    Pt & fiance were previously staying with her sister and niece locally. Per pt, they no longer want anyone staying with them, so she moved in with her other sister in [place], [place] and is flying up and staying in a hotel for treatment. CSW explored with patient if this is a sustainable plan, especially as she moves toward surgery and radiation. Patient stated it is and she would prefer to continue this arrangement than try to find any other housing nearby.  CSW shared list of hotel partners through [facility] that offer discounts for cancer patients. Provided my card with direct contact information and encouraged patient to call if needs change.     [place], [place] Worker [employer]

## 2020-06-04 NOTE — Progress Notes (Signed)
I s/w Dr. Pamelia Hoit and he would like to keep Carbo dose at 700 mg today.  Ebony Hail, Pharm.D., CPP 06/04/2020@1 :20 PM

## 2020-06-05 ENCOUNTER — Other Ambulatory Visit: Payer: Self-pay

## 2020-06-05 ENCOUNTER — Inpatient Hospital Stay: Payer: Self-pay

## 2020-06-05 VITALS — BP 174/79 | HR 73 | Resp 18

## 2020-06-05 DIAGNOSIS — C50112 Malignant neoplasm of central portion of left female breast: Secondary | ICD-10-CM

## 2020-06-05 MED ORDER — PEGFILGRASTIM-CBQV 6 MG/0.6ML ~~LOC~~ SOSY
6.0000 mg | PREFILLED_SYRINGE | Freq: Once | SUBCUTANEOUS | Status: AC
  Administered 2020-06-05: 6 mg via SUBCUTANEOUS

## 2020-06-05 MED ORDER — PEGFILGRASTIM-CBQV 6 MG/0.6ML ~~LOC~~ SOSY
PREFILLED_SYRINGE | SUBCUTANEOUS | Status: AC
  Filled 2020-06-05: qty 0.6

## 2020-06-06 ENCOUNTER — Ambulatory Visit: Payer: No Typology Code available for payment source

## 2020-06-08 ENCOUNTER — Telehealth: Payer: Self-pay | Admitting: Hematology and Oncology

## 2020-06-08 NOTE — Telephone Encounter (Signed)
Scheduled per 1/6 los. Pt will receive an updated appt calendar per next visit appt notes  

## 2020-06-25 ENCOUNTER — Other Ambulatory Visit: Payer: Self-pay | Admitting: *Deleted

## 2020-06-25 ENCOUNTER — Inpatient Hospital Stay (HOSPITAL_BASED_OUTPATIENT_CLINIC_OR_DEPARTMENT_OTHER): Payer: Self-pay | Admitting: Adult Health

## 2020-06-25 ENCOUNTER — Encounter: Payer: Self-pay | Admitting: *Deleted

## 2020-06-25 ENCOUNTER — Other Ambulatory Visit: Payer: Self-pay

## 2020-06-25 ENCOUNTER — Inpatient Hospital Stay: Payer: Self-pay

## 2020-06-25 ENCOUNTER — Other Ambulatory Visit: Payer: Self-pay | Admitting: Hematology and Oncology

## 2020-06-25 ENCOUNTER — Encounter: Payer: Self-pay | Admitting: Adult Health

## 2020-06-25 VITALS — BP 168/75 | HR 66 | Temp 97.0°F | Resp 20 | Ht 65.0 in | Wt 235.4 lb

## 2020-06-25 DIAGNOSIS — Z170 Estrogen receptor positive status [ER+]: Secondary | ICD-10-CM

## 2020-06-25 DIAGNOSIS — C50112 Malignant neoplasm of central portion of left female breast: Secondary | ICD-10-CM

## 2020-06-25 DIAGNOSIS — Z95828 Presence of other vascular implants and grafts: Secondary | ICD-10-CM

## 2020-06-25 LAB — CMP (CANCER CENTER ONLY)
ALT: 13 U/L (ref 0–44)
AST: 20 U/L (ref 15–41)
Albumin: 3.3 g/dL — ABNORMAL LOW (ref 3.5–5.0)
Alkaline Phosphatase: 67 U/L (ref 38–126)
Anion gap: 8 (ref 5–15)
BUN: 8 mg/dL (ref 6–20)
CO2: 27 mmol/L (ref 22–32)
Calcium: 8.9 mg/dL (ref 8.9–10.3)
Chloride: 106 mmol/L (ref 98–111)
Creatinine: 0.67 mg/dL (ref 0.44–1.00)
GFR, Estimated: >60 mL/min (ref >60)
Glucose, Bld: 106 mg/dL — ABNORMAL HIGH (ref 70–99)
Potassium: 3.7 mmol/L (ref 3.5–5.1)
Sodium: 141 mmol/L (ref 135–145)
Total Bilirubin: 0.7 mg/dL (ref 0.3–1.2)
Total Protein: 7.1 g/dL (ref 6.5–8.1)

## 2020-06-25 LAB — CBC WITH DIFFERENTIAL (CANCER CENTER ONLY)
Abs Immature Granulocytes: 0.01 10*3/uL (ref 0.00–0.07)
Basophils Absolute: 0.1 10*3/uL (ref 0.0–0.1)
Basophils Relative: 1 %
Eosinophils Absolute: 0 10*3/uL (ref 0.0–0.5)
Eosinophils Relative: 0 %
HCT: 30.9 % — ABNORMAL LOW (ref 36.0–46.0)
Hemoglobin: 9.9 g/dL — ABNORMAL LOW (ref 12.0–15.0)
Immature Granulocytes: 0 %
Lymphocytes Relative: 31 %
Lymphs Abs: 2.4 10*3/uL (ref 0.7–4.0)
MCH: 28.4 pg (ref 26.0–34.0)
MCHC: 32 g/dL (ref 30.0–36.0)
MCV: 88.8 fL (ref 80.0–100.0)
Monocytes Absolute: 0.5 10*3/uL (ref 0.1–1.0)
Monocytes Relative: 7 %
Neutro Abs: 4.9 10*3/uL (ref 1.7–7.7)
Neutrophils Relative %: 61 %
Platelet Count: 191 10*3/uL (ref 150–400)
RBC: 3.48 MIL/uL — ABNORMAL LOW (ref 3.87–5.11)
RDW: 21.4 % — ABNORMAL HIGH (ref 11.5–15.5)
WBC Count: 7.8 10*3/uL (ref 4.0–10.5)
nRBC: 0 % (ref 0.0–0.2)

## 2020-06-25 MED ORDER — DIPHENHYDRAMINE HCL 25 MG PO CAPS
ORAL_CAPSULE | ORAL | Status: AC
  Filled 2020-06-25: qty 2

## 2020-06-25 MED ORDER — SODIUM CHLORIDE 0.9% FLUSH
10.0000 mL | INTRAVENOUS | Status: DC | PRN
  Administered 2020-06-25: 10 mL
  Filled 2020-06-25: qty 10

## 2020-06-25 MED ORDER — PALONOSETRON HCL INJECTION 0.25 MG/5ML
0.2500 mg | Freq: Once | INTRAVENOUS | Status: AC
  Administered 2020-06-25: 0.25 mg via INTRAVENOUS

## 2020-06-25 MED ORDER — DIPHENHYDRAMINE HCL 25 MG PO CAPS
50.0000 mg | ORAL_CAPSULE | Freq: Once | ORAL | Status: AC
  Administered 2020-06-25: 50 mg via ORAL

## 2020-06-25 MED ORDER — SODIUM CHLORIDE 0.9% FLUSH
10.0000 mL | Freq: Once | INTRAVENOUS | Status: AC
  Administered 2020-06-25: 10 mL
  Filled 2020-06-25: qty 10

## 2020-06-25 MED ORDER — SODIUM CHLORIDE 0.9 % IV SOLN
Freq: Once | INTRAVENOUS | Status: AC
  Filled 2020-06-25: qty 250

## 2020-06-25 MED ORDER — AMIODARONE HCL 200 MG PO TABS: 100.0000 mg | ORAL_TABLET | Freq: Every day | ORAL | 0 refills | Status: DC

## 2020-06-25 MED ORDER — ATORVASTATIN CALCIUM 40 MG PO TABS: 40.0000 mg | ORAL_TABLET | Freq: Every day | ORAL | 0 refills | Status: DC

## 2020-06-25 MED ORDER — ANASTROZOLE 1 MG PO TABS: 1.0000 mg | ORAL_TABLET | Freq: Every day | ORAL | 0 refills | Status: DC

## 2020-06-25 MED ORDER — SODIUM CHLORIDE 0.9 % IV SOLN
420.0000 mg | Freq: Once | INTRAVENOUS | Status: AC
  Administered 2020-06-25: 420 mg via INTRAVENOUS
  Filled 2020-06-25: qty 14

## 2020-06-25 MED ORDER — APIXABAN 5 MG PO TABS: 5.0000 mg | ORAL_TABLET | Freq: Two times a day (BID) | ORAL | 0 refills | Status: DC

## 2020-06-25 MED ORDER — ACETAMINOPHEN 325 MG PO TABS
650.0000 mg | ORAL_TABLET | Freq: Once | ORAL | Status: AC
  Administered 2020-06-25: 650 mg via ORAL

## 2020-06-25 MED ORDER — ACETAMINOPHEN 325 MG PO TABS
ORAL_TABLET | ORAL | Status: AC
  Filled 2020-06-25: qty 2

## 2020-06-25 MED ORDER — DOCETAXEL CHEMO INJECTION 160 MG/16ML
60.0000 mg/m2 | Freq: Once | INTRAVENOUS | Status: AC
  Administered 2020-06-25: 140 mg via INTRAVENOUS
  Filled 2020-06-25: qty 14

## 2020-06-25 MED ORDER — SODIUM CHLORIDE 0.9 % IV SOLN
700.0000 mg | Freq: Once | INTRAVENOUS | Status: AC
  Administered 2020-06-25: 700 mg via INTRAVENOUS
  Filled 2020-06-25: qty 70

## 2020-06-25 MED ORDER — AMLODIPINE BESYLATE 5 MG PO TABS: 7.5000 mg | ORAL_TABLET | Freq: Every day | ORAL | 0 refills | Status: DC

## 2020-06-25 MED ORDER — TRASTUZUMAB CHEMO 150 MG IV SOLR
600.0000 mg | Freq: Once | INTRAVENOUS | Status: AC
  Administered 2020-06-25: 600 mg via INTRAVENOUS
  Filled 2020-06-25: qty 28.57

## 2020-06-25 MED ORDER — SODIUM CHLORIDE 0.9 % IV SOLN
10.0000 mg | Freq: Once | INTRAVENOUS | Status: AC
  Administered 2020-06-25: 10 mg via INTRAVENOUS
  Filled 2020-06-25: qty 10

## 2020-06-25 MED ORDER — HEPARIN SOD (PORK) LOCK FLUSH 100 UNIT/ML IV SOLN
500.0000 [IU] | Freq: Once | INTRAVENOUS | Status: AC | PRN
  Administered 2020-06-25: 500 [IU]
  Filled 2020-06-25: qty 5

## 2020-06-25 MED ORDER — HYDRALAZINE HCL 10 MG PO TABS: 10.0000 mg | ORAL_TABLET | Freq: Three times a day (TID) | ORAL | 0 refills | Status: DC

## 2020-06-25 MED ORDER — PALONOSETRON HCL INJECTION 0.25 MG/5ML
INTRAVENOUS | Status: AC
  Filled 2020-06-25: qty 5

## 2020-06-25 MED ORDER — SODIUM CHLORIDE 0.9 % IV SOLN
150.0000 mg | Freq: Once | INTRAVENOUS | Status: AC
  Administered 2020-06-25: 150 mg via INTRAVENOUS
  Filled 2020-06-25: qty 150

## 2020-06-25 MED FILL — ATORVASTATIN 40 MG TABLET: 40 | 90 days supply | Qty: 90 | Fill #0

## 2020-06-25 MED FILL — AMLODIPINE BESYLATE 5 MG TA: 5 | 90 days supply | Qty: 135 | Fill #0

## 2020-06-25 MED FILL — hydrALAZINE HCL 10 MG TABS: 10 | 90 days supply | Qty: 270 | Fill #0

## 2020-06-25 MED FILL — AMIODARONE HCL 200 MG TAB: 200 | 90 days supply | Qty: 45 | Fill #0

## 2020-06-25 MED FILL — ANASTROZOLE 1 MG TABLET: 1 | 90 days supply | Qty: 90 | Fill #0

## 2020-06-25 MED FILL — ELIQUIS 5 MG TABLET: 5 | 90 days supply | Qty: 180 | Fill #0

## 2020-06-25 NOTE — Patient Instructions (Addendum)
[facility] Discharge Instructions for Patients Receiving Chemotherapy  Today you received the following chemotherapy agents herceptin/perjeta/taxotere/carboplatin   To help prevent nausea and vomiting after your treatment, we encourage you to take your nausea medication as directed  If you develop nausea and vomiting that is not controlled by your nausea medication, call the clinic.   BELOW ARE SYMPTOMS THAT SHOULD BE REPORTED IMMEDIATELY:  *FEVER GREATER THAN 100.5 F  *CHILLS WITH OR WITHOUT FEVER  NAUSEA AND VOMITING THAT IS NOT CONTROLLED WITH YOUR NAUSEA MEDICATION  *UNUSUAL SHORTNESS OF BREATH  *UNUSUAL BRUISING OR BLEEDING  TENDERNESS IN MOUTH AND THROAT WITH OR WITHOUT PRESENCE OF ULCERS  *URINARY PROBLEMS  *BOWEL PROBLEMS  UNUSUAL RASH Items with * indicate a potential emergency and should be followed up as soon as possible.  Feel free to call the clinic you have any questions or concerns. The clinic phone number is [phone].  

## 2020-06-25 NOTE — Progress Notes (Signed)
Carbo dose 700mg  per MD

## 2020-06-25 NOTE — Assessment & Plan Note (Addendum)
Palpable left breast mass started February 2021 (patient lives in the [place]), went to [facility] and mammogram and ultrasound 01/03/2020: 8 cm distortion and calcifications left breast 2:00 and 3 abnormal axillary lymph nodes. 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  Treatment Plan:: 1. Neoadjuvant chemotherapy with [place] Perjeta 6 cycles followed by HerceptinPerjeta versus Kadcylamaintenance for 1 year 2. Followed bymastectomy with the targeted node dissection 3. Followed by adjuvant radiation therapy 4.Followed by antiestrogen therapy and neratinib -------------------------------------------------------------------------------------------------------------------------------- Current Treatment: Cycle 3 TCHP  ECHO 03/28/20: EF 60-65%  Chemo toxicities: Greatly improved.  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    Dr. Lindi Adie sent in her BP meds.  I called the pharmacy and asked that they change the meds he sent in to a 90 day supply.  He is in agreement.  We discussed activity level and exercise to prevent deconditioning during chemotherapy, particularly since she has had a CVA and has some residual right sided weakness that is mild from this.  I ordered repeat echo to be completed in 3 or 6 weeks while she is here from the [place].  She will return in 3 weeks for labs, f/u and her next treatment.

## 2020-06-25 NOTE — Progress Notes (Signed)
[facility] Follow up:    Courtney Perna, NP [place] [address]   DIAGNOSIS: Cancer Staging No matching staging information was found for the patient.  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 female returns for evaluation prior to receiving cycle 3 of TCHP. Her most recent echocardiogram was completed on 03/28/2020 and showed an EF of 60-65%. She is doing well today.  She notes no new issues and says she is tolerating treatment well.  Her BP is elevated today, she did miss yesterdays anti hypertensive medications and wanted some ideas on how to prevent this, since sometimes their travel from the [place] makes it difficult to pick up her meds monthly.   She has mild fatigue, and has lost her hair.  She notes her tumor is shrinking, and is happy that the treatment is working.      Patient Active Problem List   Diagnosis Date Noted  . Port-A-Cath in place 05/14/2020  . Alteration of sensations, post-stroke   . Dysesthesia   . Malignant neoplasm of central portion of left breast in female, estrogen receptor positive ([place])   . Bradycardia   . Slow transit constipation   . Acute blood loss anemia   . Benign essential HTN   . Thalamic  stroke ([place]) 04/01/2020  . Controlled type 2 diabetes mellitus with hyperglycemia, without long-term current use of insulin ([place])   . Essential hypertension   . Right hemiparesis ([place])   . CVA (cerebral vascular accident) ([facility]) 03/29/2020  . Right foot drop 03/28/2020  . CHF (congestive heart failure) ([place]) 03/28/2020  . Anemia 03/28/2020  . PAF (paroxysmal atrial fibrillation) ([place]) 03/28/2020  . Cerebral embolism with cerebral infarction 03/28/2020    has No Known Allergies.  MEDICAL HISTORY: Past Medical History:  Diagnosis Date  . Breast cancer ([place])   . CHF (congestive heart failure) ([place])   . Dysrhythmia   . Hypertension     SURGICAL HISTORY: Past Surgical History:  Procedure Laterality Date  . BREAST BIOPSY    . IR IMAGING GUIDED PORT INSERTION  05/06/2020    SOCIAL HISTORY: Social History   Socioeconomic History  . Marital status: Single    Spouse name: Not on file  . Number of children: Not on file  . Years of education: Not on file  . Highest education level: Not on file  Occupational History  . Not on file  Tobacco Use  . Smoking status: Never Smoker  . Smokeless tobacco: Never Used  Substance and Sexual Activity  . Alcohol use: Not Currently  . Drug use: Never  . Sexual activity: Not on file  Other Topics Concern  . Not on file  Social History Narrative  . Not on  file   Social Determinants of Health   Financial Resource Strain: Not on file  Food Insecurity: Not on file  Transportation Needs: Not on file  Physical Activity: Not on file  Stress: Not on file  Social Connections: Not on file  Intimate Partner Violence: Not on file    FAMILY HISTORY: Family History  Problem Relation Age of Onset  . Heart disease Mother   . Diabetes Father   . Cancer Neg Hx     Review of Systems  Constitutional: Negative for appetite change, chills, fatigue, fever and unexpected weight change.  HENT:   Negative for hearing loss, lump/mass and trouble  swallowing.   Eyes: Negative for eye problems and icterus.  Respiratory: Negative for chest tightness, cough and shortness of breath.   Cardiovascular: Negative for chest pain, leg swelling and palpitations.  Gastrointestinal: Negative for abdominal distention, abdominal pain, constipation, diarrhea, nausea and vomiting.  Endocrine: Negative for hot flashes.  Genitourinary: Negative for difficulty urinating.   Musculoskeletal: Negative for arthralgias.  Skin: Negative for itching and rash.  Neurological: Negative for dizziness, extremity weakness, headaches and numbness.  Hematological: Negative for adenopathy. Does not bruise/bleed easily.  Psychiatric/Behavioral: Negative for depression. The patient is not nervous/anxious.       PHYSICAL EXAMINATION  ECOG PERFORMANCE STATUS: 1 - Symptomatic but completely ambulatory  Vitals:   06/25/20 0839  BP: (!) 168/75  Pulse: 66  Resp: 20  Temp: (!) 97 F (36.1 C)  SpO2: 100%    Physical Exam Constitutional:      General: She is not in acute distress.    Appearance: Normal appearance. She is not toxic-appearing.  HENT:     Head: Normocephalic and atraumatic.  Eyes:     General: No scleral icterus. Cardiovascular:     Rate and Rhythm: Normal rate and regular rhythm.     Pulses: Normal pulses.     Heart sounds: Normal heart sounds.  Pulmonary:     Effort: Pulmonary effort is normal.     Breath sounds: Normal breath sounds.  Abdominal:     General: Abdomen is flat. Bowel sounds are normal. There is no distension.     Palpations: Abdomen is soft.     Tenderness: There is no abdominal tenderness.  Musculoskeletal:        General: No swelling.     Cervical back: Neck supple.  Lymphadenopathy:     Cervical: No cervical adenopathy.  Skin:    General: Skin is warm and dry.     Findings: No rash.  Neurological:     General: No focal deficit present.     Mental Status: She is alert.  Psychiatric:        Mood and Affect: Mood  normal.        Behavior: Behavior normal.     LABORATORY DATA:  CBC    Component Value Date/Time   WBC 7.8 06/25/2020 0800   WBC 8.2 05/06/2020 1315   RBC 3.48 (L) 06/25/2020 0800   HGB 9.9 (L) 06/25/2020 0800   HCT 30.9 (L) 06/25/2020 0800   PLT 191 06/25/2020 0800   MCV 88.8 06/25/2020 0800   MCH 28.4 06/25/2020 0800   MCHC 32.0 06/25/2020 0800   RDW 21.4 (H) 06/25/2020 0800   LYMPHSABS 2.4 06/25/2020 0800   MONOABS 0.5 06/25/2020 0800   EOSABS 0.0 06/25/2020 0800   BASOSABS 0.1 06/25/2020 0800    CMP     Component Value Date/Time   NA 141 06/25/2020 0800  K 3.7 06/25/2020 0800   CL 106 06/25/2020 0800   CO2 27 06/25/2020 0800   GLUCOSE 106 (H) 06/25/2020 0800   BUN 8 06/25/2020 0800   CREATININE 0.67 06/25/2020 0800   CALCIUM 8.9 06/25/2020 0800   PROT 7.1 06/25/2020 0800   ALBUMIN 3.3 (L) 06/25/2020 0800   AST 20 06/25/2020 0800   ALT 13 06/25/2020 0800   ALKPHOS 67 06/25/2020 0800   BILITOT 0.7 06/25/2020 0800   GFRNONAA >60 06/25/2020 0800           ASSESSMENT and THERAPY PLAN:   Malignant neoplasm of central portion of left breast in female, estrogen receptor positive ([place]) Palpable left breast mass started February 2021 (patient lives in the [place]), went to [facility] and mammogram and ultrasound 01/03/2020: 8 cm distortion and calcifications left breast 2:00 and 3 abnormal axillary lymph nodes. 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  Treatment Plan:: 1. Neoadjuvant chemotherapy with [place] Perjeta 6 cycles followed by HerceptinPerjeta versus Kadcylamaintenance for 1 year 2. Followed bymastectomy with the targeted node dissection 3. Followed by adjuvant radiation therapy 4.Followed by antiestrogen therapy and neratinib -------------------------------------------------------------------------------------------------------------------------------- Current Treatment: Cycle 3 TCHP  ECHO 03/28/20: EF  60-65%  Chemo toxicities: Greatly improved.  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    Dr. Lindi Adie sent in her BP meds.  I called the pharmacy and asked that they change the meds he sent in to a 90 day supply.  He is in agreement.  We discussed activity level and exercise to prevent deconditioning during chemotherapy, particularly since she has had a CVA and has some residual right sided weakness that is mild from this.  I ordered repeat echo to be completed in 3 or 6 weeks while she is here from the [place].  She will return in 3 weeks for labs, f/u and her next treatment.     Orders Placed This Encounter  Procedures  . ECHOCARDIOGRAM COMPLETE    Standing Status:   Future    Standing Expiration Date:   06/25/2021    Scheduling Instructions:     Schedule day after cycle 4 of treatment.    Order Specific Question:   Where should this test be performed    Answer:   [facility]    Order Specific Question:   Perflutren DEFINITY (image enhancing agent) should be administered unless hypersensitivity or allergy exist    Answer:   Administer Perflutren    Order Specific Question:   Reason for exam-Echo    Answer:   Chemo  Z09    All questions were answered. The patient knows to call the clinic with any problems, questions or concerns. We can certainly see the patient much sooner if necessary.  Total encounter time: 30 minutes*  Wilber Bihari, NP 06/25/20 9:18 AM Medical Oncology and Hematology [facility] [place], [place] [address] Tel. [phone]    Fax. [phone]  *Total Encounter Time as defined by the Centers for Medicare and Medicaid Services includes, in addition to the face-to-face time of a patient visit (documented in the note above) non-face-to-face time: obtaining and reviewing outside history, ordering and reviewing medications, tests or procedures, care coordination (communications with  other health care professionals or caregivers) and documentation in the medical record.

## 2020-06-26 ENCOUNTER — Inpatient Hospital Stay: Payer: Self-pay

## 2020-06-26 ENCOUNTER — Other Ambulatory Visit: Payer: Self-pay

## 2020-06-26 VITALS — BP 161/73 | HR 68

## 2020-06-26 DIAGNOSIS — Z170 Estrogen receptor positive status [ER+]: Secondary | ICD-10-CM

## 2020-06-26 MED ORDER — PEGFILGRASTIM-CBQV 6 MG/0.6ML ~~LOC~~ SOSY
6.0000 mg | PREFILLED_SYRINGE | Freq: Once | SUBCUTANEOUS | Status: AC
  Administered 2020-06-26: 6 mg via SUBCUTANEOUS

## 2020-06-26 MED ORDER — PEGFILGRASTIM-CBQV 6 MG/0.6ML ~~LOC~~ SOSY
PREFILLED_SYRINGE | SUBCUTANEOUS | Status: AC
  Filled 2020-06-26: qty 0.6

## 2020-06-26 NOTE — Patient Instructions (Signed)
Pegfilgrastim injection What is this medicine? PEGFILGRASTIM (PEG fil gra stim) is a long-acting granulocyte colony-stimulating factor that stimulates the growth of neutrophils, a type of white blood cell important in the body's fight against infection. It is used to reduce the incidence of fever and infection in patients with certain types of cancer who are receiving chemotherapy that affects the bone marrow, and to increase survival after being exposed to high doses of radiation. This medicine may be used for other purposes; ask your health care provider or pharmacist if you have questions. COMMON BRAND NAME(S): Fulphila, Neulasta, Nyvepria, UDENYCA, Ziextenzo What should I tell my health care provider before I take this medicine? They need to know if you have any of these conditions:  kidney disease  latex allergy  ongoing radiation therapy  sickle cell disease  skin reactions to acrylic adhesives (On-Body Injector only)  an unusual or allergic reaction to pegfilgrastim, filgrastim, other medicines, foods, dyes, or preservatives  pregnant or trying to get pregnant  breast-feeding How should I use this medicine? This medicine is for injection under the skin. If you get this medicine at home, you will be taught how to prepare and give the pre-filled syringe or how to use the On-body Injector. Refer to the patient Instructions for Use for detailed instructions. Use exactly as directed. Tell your healthcare provider immediately if you suspect that the On-body Injector may not have performed as intended or if you suspect the use of the On-body Injector resulted in a missed or partial dose. It is important that you put your used needles and syringes in a special sharps container. Do not put them in a trash can. If you do not have a sharps container, call your pharmacist or healthcare provider to get one. Talk to your pediatrician regarding the use of this medicine in children. While this drug  may be prescribed for selected conditions, precautions do apply. Overdosage: If you think you have taken too much of this medicine contact a poison control center or emergency room at once. NOTE: This medicine is only for you. Do not share this medicine with others. What if I miss a dose? It is important not to miss your dose. Call your doctor or health care professional if you miss your dose. If you miss a dose due to an On-body Injector failure or leakage, a new dose should be administered as soon as possible using a single prefilled syringe for manual use. What may interact with this medicine? Interactions have not been studied. This list may not describe all possible interactions. Give your health care provider a list of all the medicines, herbs, non-prescription drugs, or dietary supplements you use. Also tell them if you smoke, drink alcohol, or use illegal drugs. Some items may interact with your medicine. What should I watch for while using this medicine? Your condition will be monitored carefully while you are receiving this medicine. You may need blood work done while you are taking this medicine. Talk to your health care provider about your risk of cancer. You may be more at risk for certain types of cancer if you take this medicine. If you are going to need a MRI, CT scan, or other procedure, tell your doctor that you are using this medicine (On-Body Injector only). What side effects may I notice from receiving this medicine? Side effects that you should report to your doctor or health care professional as soon as possible:  allergic reactions (skin rash, itching or hives, swelling of   the face, lips, or tongue)  back pain  dizziness  fever  pain, redness, or irritation at site where injected  pinpoint red spots on the skin  red or dark-brown urine  shortness of breath or breathing problems  stomach or side pain, or pain at the shoulder  swelling  tiredness  trouble  passing urine or change in the amount of urine  unusual bruising or bleeding Side effects that usually do not require medical attention (report to your doctor or health care professional if they continue or are bothersome):  bone pain  muscle pain This list may not describe all possible side effects. Call your doctor for medical advice about side effects. You may report side effects to FDA at 1-800-FDA-1088. Where should I keep my medicine? Keep out of the reach of children. If you are using this medicine at home, you will be instructed on how to store it. Throw away any unused medicine after the expiration date on the label. NOTE: This sheet is a summary. It may not cover all possible information. If you have questions about this medicine, talk to your doctor, pharmacist, or health care provider.  2021 Elsevier/Gold Standard (2019-06-07 13:20:51)  

## 2020-06-27 ENCOUNTER — Ambulatory Visit: Payer: No Typology Code available for payment source

## 2020-06-30 ENCOUNTER — Telehealth: Payer: Self-pay | Admitting: Adult Health

## 2020-06-30 NOTE — Telephone Encounter (Signed)
No 12/7 los. No changes made to pt's schedule.  

## 2020-07-06 ENCOUNTER — Telehealth: Payer: Self-pay | Admitting: Hematology and Oncology

## 2020-07-06 NOTE — Telephone Encounter (Signed)
Called pt per 2/4 sch msg - no answer and no vmail.

## 2020-07-14 NOTE — Assessment & Plan Note (Signed)
Palpable left breast mass started February 2021 (patient lives in the [place]), went to [facility] and mammogram and ultrasound 01/03/2020: 8 cm distortion and calcifications left breast 2:00 and 3 abnormal axillary lymph nodes. 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  Treatment Plan:: 1. Neoadjuvant chemotherapy with [place] Perjeta 6 cycles followed by HerceptinPerjeta versus Kadcylamaintenance for 1 year 2. Followed bymastectomy with the targeted node dissection 3. Followed by adjuvant radiation therapy 4.Followed by antiestrogen therapy and neratinib -------------------------------------------------------------------------------------------------------------------------------- Current Treatment: Cycle 4TCHP ECHO 03/28/20: EF 60-65%  Chemo toxicities: Greatly improved.  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

## 2020-07-14 NOTE — Progress Notes (Signed)
HEMATOLOGY-ONCOLOGY MYCHART VIDEO VISIT PROGRESS NOTE  I connected with Courtney Coleman on 07/15/2020 at  8:30 AM EST by MyChart video conference and verified that I am speaking with the correct person using two identifiers.  I discussed the limitations, risks, security and privacy concerns of performing an evaluation and management service by MyChart and the availability of in person appointments.  I also discussed with the patient that there may be a patient responsible charge related to this service. The patient expressed understanding and agreed to proceed.  Patient's Location: Home Physician Location: Clinic  CHIEF COMPLIANT: Cycle 4Day [place] Perjeta  INTERVAL HISTORY: Courtney Coleman is a 55 y.o. female with above-mentioned history of left breast cancercurrently on neoadjuvant chemotherapy with [facility] Perjeta.She presents to the clinic todayfora toxicity check and cycle 4.  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  She has darkening discoloration of the skin of the hands and feet.  Nailbed discoloration also noted.  Denies any nausea vomiting.  Has good appetite.  Oncology History  Malignant neoplasm of central portion of left breast in female, estrogen receptor positive ([place])   Initial Diagnosis   Palpable left breast mass started February 2021 (patient lives in the [place]), went to [facility] and mammogram and ultrasound 01/03/2020: 8 cm distortion and calcifications left breast 2:00 and 3 abnormal axillary lymph nodes.  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  I have reviewed the data as listed CMP Latest Ref Rng & Units 06/25/2020 06/04/2020 05/21/2020  Glucose 70 - 99 mg/dL 106(H) 140(H) 111(H)  BUN 6 - 20 mg/dL 8 10 15   Creatinine 0.44 - 1.00 mg/dL 0.67 0.70 0.69  Sodium 135 - 145 mmol/L 141 142 141  Potassium 3.5 - 5.1 mmol/L 3.7 3.3(L) 3.4(L)  Chloride 98 - 111 mmol/L 106 107 107  CO2 22 - 32 mmol/L 27 28 25   Calcium 8.9 - 10.3 mg/dL 8.9 9.0 8.9  Total Protein 6.5 - 8.1 g/dL 7.1 7.2 7.8  Total Bilirubin 0.3 - 1.2 mg/dL 0.7 0.5 1.0  Alkaline Phos 38 - 126 U/L 67 81 95  AST 15 - 41 U/L 20 17 19   ALT 0 - 44 U/L 13 14 21     Lab Results  Component Value Date   WBC 6.5 07/15/2020   HGB 9.5 (L) 07/15/2020   HCT 29.3 (L) 07/15/2020   MCV 92.4 07/15/2020   PLT 195 07/15/2020   NEUTROABS 4.0 07/15/2020      Assessment Plan:  Malignant neoplasm of central portion of left breast in female, estrogen receptor positive ([place]) Palpable left breast mass started February 2021 (patient lives in the [place]), went to [place] and mammogram and ultrasound 01/03/2020: 8 cm distortion and calcifications left breast 2:00 and 3 abnormal axillary lymph nodes. 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  Treatment Plan:: 1. Neoadjuvant chemotherapy  with [place] Perjeta 6 cycles followed by HerceptinPerjeta versus Kadcylamaintenance for 1 year 2. Followed bymastectomy with the targeted node dissection 3. Followed by adjuvant radiation therapy 4.Followed by antiestrogen therapy and neratinib -------------------------------------------------------------------------------------------------------------------------------- Current Treatment: Cycle 4TCHP ECHO 03/28/20: EF 60-65%  Chemo toxicities: 1. 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   2. Fatigue 3. Alopecia 4. Chemo induced anemia: Hemoglobin is  9.5.  Monitoring 5.  Nail discoloration  I will refer her to see one of the surgeons to start discussions about post neoadjuvant chemo surgical options.  I discussed the assessment and treatment plan with the patient. The patient was provided an opportunity to ask questions and all were answered. The patient agreed with the plan and demonstrated an understanding of the instructions. The patient was advised to call back or seek an in-person evaluation if the symptoms worsen or if the condition fails to improve as anticipated.   Total time spent: 30 minutes including face-to-face MyChart video visit time and time spent for planning, charting and coordination of care  Rulon Eisenmenger, MD 07/15/2020   I, Cloyde Reams Dorshimer, am acting as scribe for Nicholas Lose, MD.  I have reviewed the above documentation for accuracy and completeness, and I agree with the above.

## 2020-07-15 ENCOUNTER — Other Ambulatory Visit: Payer: Self-pay

## 2020-07-15 ENCOUNTER — Inpatient Hospital Stay: Payer: Self-pay | Attending: Hematology and Oncology

## 2020-07-15 ENCOUNTER — Inpatient Hospital Stay: Payer: Self-pay

## 2020-07-15 ENCOUNTER — Inpatient Hospital Stay (HOSPITAL_BASED_OUTPATIENT_CLINIC_OR_DEPARTMENT_OTHER): Payer: Self-pay | Admitting: Hematology and Oncology

## 2020-07-15 ENCOUNTER — Telehealth: Payer: Self-pay | Admitting: Hematology and Oncology

## 2020-07-15 DIAGNOSIS — C50112 Malignant neoplasm of central portion of left female breast: Secondary | ICD-10-CM | POA: Insufficient documentation

## 2020-07-15 DIAGNOSIS — Z5189 Encounter for other specified aftercare: Secondary | ICD-10-CM | POA: Insufficient documentation

## 2020-07-15 DIAGNOSIS — Z5112 Encounter for antineoplastic immunotherapy: Secondary | ICD-10-CM | POA: Insufficient documentation

## 2020-07-15 DIAGNOSIS — Z95828 Presence of other vascular implants and grafts: Secondary | ICD-10-CM

## 2020-07-15 DIAGNOSIS — Z170 Estrogen receptor positive status [ER+]: Secondary | ICD-10-CM

## 2020-07-15 DIAGNOSIS — Z5111 Encounter for antineoplastic chemotherapy: Secondary | ICD-10-CM | POA: Insufficient documentation

## 2020-07-15 DIAGNOSIS — Z79899 Other long term (current) drug therapy: Secondary | ICD-10-CM | POA: Insufficient documentation

## 2020-07-15 LAB — CMP (CANCER CENTER ONLY)
ALT: 16 U/L (ref 0–44)
AST: 22 U/L (ref 15–41)
Albumin: 3.3 g/dL — ABNORMAL LOW (ref 3.5–5.0)
Alkaline Phosphatase: 74 U/L (ref 38–126)
Anion gap: 10 (ref 5–15)
BUN: 7 mg/dL (ref 6–20)
CO2: 27 mmol/L (ref 22–32)
Calcium: 8.5 mg/dL — ABNORMAL LOW (ref 8.9–10.3)
Chloride: 106 mmol/L (ref 98–111)
Creatinine: 0.7 mg/dL (ref 0.44–1.00)
GFR, Estimated: >60 mL/min (ref >60)
Glucose, Bld: 103 mg/dL — ABNORMAL HIGH (ref 70–99)
Potassium: 3.6 mmol/L (ref 3.5–5.1)
Sodium: 143 mmol/L (ref 135–145)
Total Bilirubin: 0.8 mg/dL (ref 0.3–1.2)
Total Protein: 6.6 g/dL (ref 6.5–8.1)

## 2020-07-15 LAB — CBC WITH DIFFERENTIAL (CANCER CENTER ONLY)
Abs Immature Granulocytes: 0.01 10*3/uL (ref 0.00–0.07)
Basophils Absolute: 0.1 10*3/uL (ref 0.0–0.1)
Basophils Relative: 1 %
Eosinophils Absolute: 0 10*3/uL (ref 0.0–0.5)
Eosinophils Relative: 0 %
HCT: 29.3 % — ABNORMAL LOW (ref 36.0–46.0)
Hemoglobin: 9.5 g/dL — ABNORMAL LOW (ref 12.0–15.0)
Immature Granulocytes: 0 %
Lymphocytes Relative: 30 %
Lymphs Abs: 2 10*3/uL (ref 0.7–4.0)
MCH: 30 pg (ref 26.0–34.0)
MCHC: 32.4 g/dL (ref 30.0–36.0)
MCV: 92.4 fL (ref 80.0–100.0)
Monocytes Absolute: 0.5 10*3/uL (ref 0.1–1.0)
Monocytes Relative: 8 %
Neutro Abs: 4 10*3/uL (ref 1.7–7.7)
Neutrophils Relative %: 61 %
Platelet Count: 195 10*3/uL (ref 150–400)
RBC: 3.17 MIL/uL — ABNORMAL LOW (ref 3.87–5.11)
RDW: 19.4 % — ABNORMAL HIGH (ref 11.5–15.5)
WBC Count: 6.5 10*3/uL (ref 4.0–10.5)
nRBC: 0 % (ref 0.0–0.2)

## 2020-07-15 MED ORDER — SODIUM CHLORIDE 0.9 % IV SOLN
Freq: Once | INTRAVENOUS | Status: AC
Start: 2020-07-15 — End: 2020-07-15
  Filled 2020-07-15: qty 250

## 2020-07-15 MED ORDER — ACETAMINOPHEN 325 MG PO TABS
ORAL_TABLET | ORAL | Status: AC
  Filled 2020-07-15: qty 2

## 2020-07-15 MED ORDER — SODIUM CHLORIDE 0.9 % IV SOLN
10.0000 mg | Freq: Once | INTRAVENOUS | Status: AC
  Administered 2020-07-15: 10 mg via INTRAVENOUS
  Filled 2020-07-15: qty 10

## 2020-07-15 MED ORDER — SODIUM CHLORIDE 0.9% FLUSH
10.0000 mL | Freq: Once | INTRAVENOUS | Status: AC
  Administered 2020-07-15: 10 mL
  Filled 2020-07-15: qty 10

## 2020-07-15 MED ORDER — PALONOSETRON HCL INJECTION 0.25 MG/5ML
INTRAVENOUS | Status: AC
  Filled 2020-07-15: qty 5

## 2020-07-15 MED ORDER — ACETAMINOPHEN 325 MG PO TABS
650.0000 mg | ORAL_TABLET | Freq: Once | ORAL | Status: AC
  Administered 2020-07-15: 650 mg via ORAL

## 2020-07-15 MED ORDER — DIPHENHYDRAMINE HCL 25 MG PO CAPS
50.0000 mg | ORAL_CAPSULE | Freq: Once | ORAL | Status: AC
  Administered 2020-07-15: 50 mg via ORAL

## 2020-07-15 MED ORDER — PALONOSETRON HCL INJECTION 0.25 MG/5ML
0.2500 mg | Freq: Once | INTRAVENOUS | Status: AC
  Administered 2020-07-15: 0.25 mg via INTRAVENOUS

## 2020-07-15 MED ORDER — FOSAPREPITANT DIMEGLUMINE INJECTION 150 MG
150.0000 mg | Freq: Once | INTRAVENOUS | Status: AC
  Administered 2020-07-15: 150 mg via INTRAVENOUS
  Filled 2020-07-15: qty 150

## 2020-07-15 MED ORDER — HEPARIN SOD (PORK) LOCK FLUSH 100 UNIT/ML IV SOLN
500.0000 [IU] | Freq: Once | INTRAVENOUS | Status: DC
  Filled 2020-07-15: qty 5

## 2020-07-15 MED ORDER — SODIUM CHLORIDE 0.9 % IV SOLN
60.0000 mg/m2 | Freq: Once | INTRAVENOUS | Status: AC
  Administered 2020-07-15: 140 mg via INTRAVENOUS
  Filled 2020-07-15: qty 14

## 2020-07-15 MED ORDER — DIPHENHYDRAMINE HCL 25 MG PO CAPS
ORAL_CAPSULE | ORAL | Status: AC
  Filled 2020-07-15: qty 2

## 2020-07-15 MED ORDER — SODIUM CHLORIDE 0.9 % IV SOLN
700.0000 mg | Freq: Once | INTRAVENOUS | Status: AC
  Administered 2020-07-15: 700 mg via INTRAVENOUS
  Filled 2020-07-15: qty 70

## 2020-07-15 MED ORDER — TRASTUZUMAB CHEMO 150 MG IV SOLR
600.0000 mg | Freq: Once | INTRAVENOUS | Status: AC
Start: 2020-07-15 — End: 2020-07-15
  Administered 2020-07-15: 600 mg via INTRAVENOUS
  Filled 2020-07-15: qty 28.57

## 2020-07-15 MED ORDER — SODIUM CHLORIDE 0.9% FLUSH
10.0000 mL | INTRAVENOUS | Status: DC | PRN
  Administered 2020-07-15: 10 mL
  Filled 2020-07-15: qty 10

## 2020-07-15 MED ORDER — HEPARIN SOD (PORK) LOCK FLUSH 100 UNIT/ML IV SOLN
500.0000 [IU] | Freq: Once | INTRAVENOUS | Status: AC | PRN
  Administered 2020-07-15: 500 [IU]
  Filled 2020-07-15: qty 5

## 2020-07-15 MED ORDER — SODIUM CHLORIDE 0.9 % IV SOLN
420.0000 mg | Freq: Once | INTRAVENOUS | Status: AC
  Administered 2020-07-15: 420 mg via INTRAVENOUS
  Filled 2020-07-15: qty 14

## 2020-07-15 NOTE — Telephone Encounter (Signed)
Scheduled appointments per 2/16 los. Spoke to patient who is aware of appointments dates and times. Gave patient calendar print out.

## 2020-07-15 NOTE — Patient Instructions (Signed)
[facility] Discharge Instructions for Patients Receiving Chemotherapy  Today you received the following chemotherapy agents TCHZ(Traztuzumab(Hercerptin) - Pertuzumab(Perjeta) - Docetaxel(Taxol) - Carboplatin)  To help prevent nausea and vomiting after your treatment, we encourage you to take your nausea medication as directed.   If you develop nausea and vomiting that is not controlled by your nausea medication, call the clinic.   BELOW ARE SYMPTOMS THAT SHOULD BE REPORTED IMMEDIATELY:  *FEVER GREATER THAN 100.5 F  *CHILLS WITH OR WITHOUT FEVER  NAUSEA AND VOMITING THAT IS NOT CONTROLLED WITH YOUR NAUSEA MEDICATION  *UNUSUAL SHORTNESS OF BREATH  *UNUSUAL BRUISING OR BLEEDING  TENDERNESS IN MOUTH AND THROAT WITH OR WITHOUT PRESENCE OF ULCERS  *URINARY PROBLEMS  *BOWEL PROBLEMS  UNUSUAL RASH Items with * indicate a potential emergency and should be followed up as soon as possible.  Feel free to call the clinic should you have any questions or concerns. The clinic phone number is [phone].  Please show the [facility] at check-in to the Emergency Department and triage nurse.

## 2020-07-16 ENCOUNTER — Ambulatory Visit (INDEPENDENT_AMBULATORY_CARE_PROVIDER_SITE_OTHER): Payer: Self-pay | Admitting: Primary Care

## 2020-07-16 ENCOUNTER — Other Ambulatory Visit: Payer: Self-pay

## 2020-07-16 ENCOUNTER — Inpatient Hospital Stay: Payer: Self-pay

## 2020-07-16 ENCOUNTER — Other Ambulatory Visit: Payer: Self-pay | Admitting: *Deleted

## 2020-07-16 ENCOUNTER — Other Ambulatory Visit (HOSPITAL_COMMUNITY): Payer: Self-pay

## 2020-07-16 VITALS — BP 151/59 | HR 74 | Temp 98.0°F | Resp 18

## 2020-07-16 DIAGNOSIS — Z170 Estrogen receptor positive status [ER+]: Secondary | ICD-10-CM

## 2020-07-16 MED ORDER — PEGFILGRASTIM-CBQV 6 MG/0.6ML ~~LOC~~ SOSY
PREFILLED_SYRINGE | SUBCUTANEOUS | Status: AC
  Filled 2020-07-16: qty 0.6

## 2020-07-16 MED ORDER — PEGFILGRASTIM-CBQV 6 MG/0.6ML ~~LOC~~ SOSY
6.0000 mg | PREFILLED_SYRINGE | Freq: Once | SUBCUTANEOUS | Status: AC
  Administered 2020-07-16: 6 mg via SUBCUTANEOUS

## 2020-07-16 NOTE — Patient Instructions (Signed)
Pegfilgrastim injection What is this medicine? PEGFILGRASTIM (PEG fil gra stim) is a long-acting granulocyte colony-stimulating factor that stimulates the growth of neutrophils, a type of white blood cell important in the body's fight against infection. It is used to reduce the incidence of fever and infection in patients with certain types of cancer who are receiving chemotherapy that affects the bone marrow, and to increase survival after being exposed to high doses of radiation. This medicine may be used for other purposes; ask your health care provider or pharmacist if you have questions. COMMON BRAND NAME(S): Fulphila, Neulasta, Nyvepria, UDENYCA, Ziextenzo What should I tell my health care provider before I take this medicine? They need to know if you have any of these conditions:  kidney disease  latex allergy  ongoing radiation therapy  sickle cell disease  skin reactions to acrylic adhesives (On-Body Injector only)  an unusual or allergic reaction to pegfilgrastim, filgrastim, other medicines, foods, dyes, or preservatives  pregnant or trying to get pregnant  breast-feeding How should I use this medicine? This medicine is for injection under the skin. If you get this medicine at home, you will be taught how to prepare and give the pre-filled syringe or how to use the On-body Injector. Refer to the patient Instructions for Use for detailed instructions. Use exactly as directed. Tell your healthcare provider immediately if you suspect that the On-body Injector may not have performed as intended or if you suspect the use of the On-body Injector resulted in a missed or partial dose. It is important that you put your used needles and syringes in a special sharps container. Do not put them in a trash can. If you do not have a sharps container, call your pharmacist or healthcare provider to get one. Talk to your pediatrician regarding the use of this medicine in children. While this drug  may be prescribed for selected conditions, precautions do apply. Overdosage: If you think you have taken too much of this medicine contact a poison control center or emergency room at once. NOTE: This medicine is only for you. Do not share this medicine with others. What if I miss a dose? It is important not to miss your dose. Call your doctor or health care professional if you miss your dose. If you miss a dose due to an On-body Injector failure or leakage, a new dose should be administered as soon as possible using a single prefilled syringe for manual use. What may interact with this medicine? Interactions have not been studied. This list may not describe all possible interactions. Give your health care provider a list of all the medicines, herbs, non-prescription drugs, or dietary supplements you use. Also tell them if you smoke, drink alcohol, or use illegal drugs. Some items may interact with your medicine. What should I watch for while using this medicine? Your condition will be monitored carefully while you are receiving this medicine. You may need blood work done while you are taking this medicine. Talk to your health care provider about your risk of cancer. You may be more at risk for certain types of cancer if you take this medicine. If you are going to need a MRI, CT scan, or other procedure, tell your doctor that you are using this medicine (On-Body Injector only). What side effects may I notice from receiving this medicine? Side effects that you should report to your doctor or health care professional as soon as possible:  allergic reactions (skin rash, itching or hives, swelling of   the face, lips, or tongue)  back pain  dizziness  fever  pain, redness, or irritation at site where injected  pinpoint red spots on the skin  red or dark-brown urine  shortness of breath or breathing problems  stomach or side pain, or pain at the shoulder  swelling  tiredness  trouble  passing urine or change in the amount of urine  unusual bruising or bleeding Side effects that usually do not require medical attention (report to your doctor or health care professional if they continue or are bothersome):  bone pain  muscle pain This list may not describe all possible side effects. Call your doctor for medical advice about side effects. You may report side effects to FDA at 1-800-FDA-1088. Where should I keep my medicine? Keep out of the reach of children. If you are using this medicine at home, you will be instructed on how to store it. Throw away any unused medicine after the expiration date on the label. NOTE: This sheet is a summary. It may not cover all possible information. If you have questions about this medicine, talk to your doctor, pharmacist, or health care provider.  2021 Elsevier/Gold Standard (2019-06-07 13:20:51)  

## 2020-07-17 ENCOUNTER — Ambulatory Visit: Payer: Self-pay

## 2020-07-23 ENCOUNTER — Other Ambulatory Visit (HOSPITAL_COMMUNITY): Payer: Self-pay

## 2020-08-05 NOTE — Progress Notes (Signed)
Patient Care Team: Kerin Perna, NP as PCP - General (Internal Medicine) Mauro Kaufmann, RN as Oncology Nurse Navigator Rockwell Germany, RN as Oncology Nurse Navigator  DIAGNOSIS:    ICD-10-CM   1. Malignant neoplasm of central portion of left breast in female, estrogen receptor positive ([place])  C50.112    Z17.0     SUMMARY OF ONCOLOGIC HISTORY: Oncology History  Malignant neoplasm of central portion of left breast in female, estrogen receptor positive ([place])   Initial Diagnosis   Palpable left breast mass started February 2021 (patient lives in the [place]), went to [place] and mammogram and ultrasound 01/03/2020: 8 cm distortion and calcifications left breast 2:00 and 3 abnormal axillary lymph nodes.  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 with above-mentioned history of left breast cancercurrently on neoadjuvant chemotherapy with [place].She presents to the clinic todayfora toxicity check and cycle 5.   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  Otherwise she does not have any neuropathy in the feet.  Denies any nausea or vomiting.  She does feel tired the first week after chemo but then she recovers.  Every week she goes to [place] and comes back in time for her treatment.  ALLERGIES:  has No Known Allergies.  MEDICATIONS:  Current Outpatient Medications  Medication Sig Dispense Refill  . amiodarone (PACERONE) 200 MG tablet Take 0.5 tablets (100 mg total) by mouth daily. 30 tablet 0  . amLODipine (NORVASC) 5 MG tablet Take 1.5 tablets (7.5 mg total) by mouth daily. 45 tablet 0  . anastrozole (ARIMIDEX) 1  MG tablet Take 1 tablet (1 mg total) by mouth daily. 30 tablet 0  . apixaban (ELIQUIS) 5 MG TABS tablet Take 1 tablet (5 mg total) by mouth 2 (two) times daily. 60 tablet 0  . atorvastatin (LIPITOR) 40 MG tablet Take 1 tablet (40 mg total) by mouth daily. 30 tablet 0  . dexamethasone (DECADRON) 4 MG tablet Take 1 tablet (4 mg total) by mouth daily. Okay take 1 tablet day before chemo and 1 tablet day after chemo with food 12 tablet 0  . hydrALAZINE (APRESOLINE) 10 MG tablet Take 1 tablet (10 mg total) by mouth every 8 (eight) hours. 90 tablet 0  . lidocaine-prilocaine (EMLA) cream Apply to affected area once 30 g 3  . loperamide (IMODIUM) 2 MG capsule Take 1 capsule (2 mg total) by mouth as needed for diarrhea or loose stools. 60 capsule 1  . ondansetron (ZOFRAN) 8 MG tablet Take 1 tablet (8 mg total) by mouth 2 (two) times daily as needed (Nausea or vomiting). Start on the third day after chemotherapy. 30 tablet 1  . polyethylene glycol (MIRALAX / GLYCOLAX) 17 g packet Take 17 g by mouth 2 (two) times daily. 60 each 0  . polyethylene glycol powder (GLYCOLAX/MIRALAX) 17 GM/SCOOP powder SMARTSIG:1 Capful(s) By Mouth Twice Daily    . prochlorperazine (COMPAZINE) 10 MG tablet Take 1 tablet (10 mg total) by mouth every 6 (six) hours as needed (Nausea or vomiting). 30 tablet 1  . sacubitril-valsartan (ENTRESTO) 49-51 MG Take  1 tablet by mouth 2 (two) times daily. 60 tablet 0   No current facility-administered medications for this visit.    PHYSICAL EXAMINATION: ECOG PERFORMANCE STATUS: 1 - Symptomatic but completely ambulatory  Vitals:   08/06/20 0939  BP: (!) 149/67  Pulse: 70  Resp: 18  Temp: 98.1 F (36.7 C)  SpO2: 100%   Filed Weights   08/06/20 0939  Weight: 231 lb 9.6 oz (105.1 kg)      LABORATORY DATA:  I have reviewed the data as listed CMP Latest Ref Rng & Units 08/06/2020 07/15/2020 06/25/2020  Glucose 70 - 99 mg/dL 145(H) 103(H) 106(H)  BUN 6 - 20 mg/dL 9 7 8   Creatinine  0.44 - 1.00 mg/dL 0.69 0.70 0.67  Sodium 135 - 145 mmol/L 141 143 141  Potassium 3.5 - 5.1 mmol/L 3.2(L) 3.6 3.7  Chloride 98 - 111 mmol/L 107 106 106  CO2 22 - 32 mmol/L 27 27 27   Calcium 8.9 - 10.3 mg/dL 8.5(L) 8.5(L) 8.9  Total Protein 6.5 - 8.1 g/dL 6.9 6.6 7.1  Total Bilirubin 0.3 - 1.2 mg/dL 0.7 0.8 0.7  Alkaline Phos 38 - 126 U/L 70 74 67  AST 15 - 41 U/L 18 22 20   ALT 0 - 44 U/L 16 16 13     Lab Results  Component Value Date   WBC 6.8 08/06/2020   HGB 9.9 (L) 08/06/2020   HCT 30.4 (L) 08/06/2020   MCV 97.1 08/06/2020   PLT 223 08/06/2020   NEUTROABS 3.9 08/06/2020    ASSESSMENT & PLAN:  Malignant neoplasm of central portion of left breast in female, estrogen receptor positive ([place]) Palpable left breast mass started February 2021 (patient lives in the [place]), went to [place] and mammogram and ultrasound 01/03/2020: 8 cm distortion and calcifications left breast 2:00 and 3 abnormal axillary lymph nodes. 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  Treatment Plan:: 1. Neoadjuvant chemotherapy with [place] Perjeta 6 cycles followed by HerceptinPerjeta versus Kadcylamaintenance for 1 year 2. Followed bymastectomy with the targeted node dissection 3. Followed by adjuvant radiation therapy 4.Followed by antiestrogen therapy and neratinib -------------------------------------------------------------------------------------------------------------------------------- Current Treatment: Cycle5TCHP ECHO 03/28/20: EF 60-65%  Chemo toxicities: 1. 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  2. Fatigue 3. Alopecia 4. Chemo induced anemia: Hemoglobin is 9.9.  Monitoring 5.  Nail discoloration  Return to clinic in 3 weeks for cycle 6 She has a breast MRI set up on 08/27/2020. She has a appointment with general surgery on 09/01/2020.   No orders of the defined types were placed in this encounter.  The patient has a good  understanding of the overall plan. she agrees with it. she will call with any problems that may develop before the next visit here.  Total time spent: 30 mins including face to face time and time spent for planning, charting and coordination of care  Rulon Eisenmenger, MD, MPH 08/06/2020  I, Molly Dorshimer, am acting as scribe for Dr. Nicholas Lose.  I have reviewed the above documentation for accuracy and completeness, and I agree with the above.

## 2020-08-06 ENCOUNTER — Inpatient Hospital Stay: Payer: Self-pay | Attending: Hematology and Oncology

## 2020-08-06 ENCOUNTER — Inpatient Hospital Stay: Payer: Self-pay

## 2020-08-06 ENCOUNTER — Encounter: Payer: Self-pay | Admitting: *Deleted

## 2020-08-06 ENCOUNTER — Inpatient Hospital Stay (HOSPITAL_BASED_OUTPATIENT_CLINIC_OR_DEPARTMENT_OTHER): Payer: Self-pay | Admitting: Hematology and Oncology

## 2020-08-06 ENCOUNTER — Ambulatory Visit (HOSPITAL_COMMUNITY)
Admission: RE | Admit: 2020-08-06 | Discharge: 2020-08-06 | Disposition: A | Discharge status: Home / Self Care | Payer: Self-pay | Source: Ambulatory Visit | Attending: Adult Health | Admitting: Adult Health

## 2020-08-06 ENCOUNTER — Other Ambulatory Visit: Payer: Self-pay

## 2020-08-06 ENCOUNTER — Encounter: Payer: Self-pay | Admitting: Hematology and Oncology

## 2020-08-06 DIAGNOSIS — Z170 Estrogen receptor positive status [ER+]: Secondary | ICD-10-CM

## 2020-08-06 DIAGNOSIS — Z8673 Personal history of transient ischemic attack (TIA), and cerebral infarction without residual deficits: Secondary | ICD-10-CM | POA: Insufficient documentation

## 2020-08-06 DIAGNOSIS — I351 Nonrheumatic aortic (valve) insufficiency: Secondary | ICD-10-CM | POA: Insufficient documentation

## 2020-08-06 DIAGNOSIS — Z79899 Other long term (current) drug therapy: Secondary | ICD-10-CM | POA: Insufficient documentation

## 2020-08-06 DIAGNOSIS — C50112 Malignant neoplasm of central portion of left female breast: Secondary | ICD-10-CM | POA: Insufficient documentation

## 2020-08-06 DIAGNOSIS — Z5189 Encounter for other specified aftercare: Secondary | ICD-10-CM | POA: Insufficient documentation

## 2020-08-06 DIAGNOSIS — Z5112 Encounter for antineoplastic immunotherapy: Secondary | ICD-10-CM | POA: Insufficient documentation

## 2020-08-06 DIAGNOSIS — Z5181 Encounter for therapeutic drug level monitoring: Secondary | ICD-10-CM | POA: Insufficient documentation

## 2020-08-06 DIAGNOSIS — Z5111 Encounter for antineoplastic chemotherapy: Secondary | ICD-10-CM | POA: Insufficient documentation

## 2020-08-06 DIAGNOSIS — I4891 Unspecified atrial fibrillation: Secondary | ICD-10-CM | POA: Insufficient documentation

## 2020-08-06 DIAGNOSIS — Z95828 Presence of other vascular implants and grafts: Secondary | ICD-10-CM

## 2020-08-06 DIAGNOSIS — I10 Essential (primary) hypertension: Secondary | ICD-10-CM | POA: Insufficient documentation

## 2020-08-06 LAB — CBC WITH DIFFERENTIAL (CANCER CENTER ONLY)
Abs Immature Granulocytes: 0.01 10*3/uL (ref 0.00–0.07)
Basophils Absolute: 0 10*3/uL (ref 0.0–0.1)
Basophils Relative: 0 %
Eosinophils Absolute: 0 10*3/uL (ref 0.0–0.5)
Eosinophils Relative: 0 %
HCT: 30.4 % — ABNORMAL LOW (ref 36.0–46.0)
Hemoglobin: 9.9 g/dL — ABNORMAL LOW (ref 12.0–15.0)
Immature Granulocytes: 0 %
Lymphocytes Relative: 37 %
Lymphs Abs: 2.5 10*3/uL (ref 0.7–4.0)
MCH: 31.6 pg (ref 26.0–34.0)
MCHC: 32.6 g/dL (ref 30.0–36.0)
MCV: 97.1 fL (ref 80.0–100.0)
Monocytes Absolute: 0.4 10*3/uL (ref 0.1–1.0)
Monocytes Relative: 6 %
Neutro Abs: 3.9 10*3/uL (ref 1.7–7.7)
Neutrophils Relative %: 57 %
Platelet Count: 223 10*3/uL (ref 150–400)
RBC: 3.13 MIL/uL — ABNORMAL LOW (ref 3.87–5.11)
RDW: 18.6 % — ABNORMAL HIGH (ref 11.5–15.5)
WBC Count: 6.8 10*3/uL (ref 4.0–10.5)
nRBC: 0 % (ref 0.0–0.2)

## 2020-08-06 LAB — CMP (CANCER CENTER ONLY)
ALT: 16 U/L (ref 0–44)
AST: 18 U/L (ref 15–41)
Albumin: 3.2 g/dL — ABNORMAL LOW (ref 3.5–5.0)
Alkaline Phosphatase: 70 U/L (ref 38–126)
Anion gap: 7 (ref 5–15)
BUN: 9 mg/dL (ref 6–20)
CO2: 27 mmol/L (ref 22–32)
Calcium: 8.5 mg/dL — ABNORMAL LOW (ref 8.9–10.3)
Chloride: 107 mmol/L (ref 98–111)
Creatinine: 0.69 mg/dL (ref 0.44–1.00)
GFR, Estimated: >60 mL/min (ref >60)
Glucose, Bld: 145 mg/dL — ABNORMAL HIGH (ref 70–99)
Potassium: 3.2 mmol/L — ABNORMAL LOW (ref 3.5–5.1)
Sodium: 141 mmol/L (ref 135–145)
Total Bilirubin: 0.7 mg/dL (ref 0.3–1.2)
Total Protein: 6.9 g/dL (ref 6.5–8.1)

## 2020-08-06 LAB — ECHOCARDIOGRAM COMPLETE
Area-P 1/2: 2.99 cm2
S' Lateral: 3.6 cm

## 2020-08-06 MED ORDER — HEPARIN SOD (PORK) LOCK FLUSH 100 UNIT/ML IV SOLN
500.0000 [IU] | Freq: Once | INTRAVENOUS | Status: AC | PRN
  Administered 2020-08-06: 500 [IU]
  Filled 2020-08-06: qty 5

## 2020-08-06 MED ORDER — ACETAMINOPHEN 325 MG PO TABS
ORAL_TABLET | ORAL | Status: AC
  Filled 2020-08-06: qty 2

## 2020-08-06 MED ORDER — DIPHENHYDRAMINE HCL 25 MG PO CAPS
ORAL_CAPSULE | ORAL | Status: AC
  Filled 2020-08-06: qty 2

## 2020-08-06 MED ORDER — ACETAMINOPHEN 325 MG PO TABS
650.0000 mg | ORAL_TABLET | Freq: Once | ORAL | Status: AC
  Administered 2020-08-06: 650 mg via ORAL

## 2020-08-06 MED ORDER — SODIUM CHLORIDE 0.9 % IV SOLN
600.0000 mg | Freq: Once | INTRAVENOUS | Status: AC
  Administered 2020-08-06: 600 mg via INTRAVENOUS
  Filled 2020-08-06: qty 28.57

## 2020-08-06 MED ORDER — PALONOSETRON HCL INJECTION 0.25 MG/5ML
0.2500 mg | Freq: Once | INTRAVENOUS | Status: AC
  Administered 2020-08-06: 0.25 mg via INTRAVENOUS

## 2020-08-06 MED ORDER — CARBOPLATIN CHEMO INJECTION 600 MG/60ML
700.0000 mg | Freq: Once | INTRAVENOUS | Status: AC
  Administered 2020-08-06: 700 mg via INTRAVENOUS
  Filled 2020-08-06: qty 70

## 2020-08-06 MED ORDER — SODIUM CHLORIDE 0.9% FLUSH
10.0000 mL | Freq: Once | INTRAVENOUS | Status: AC
  Administered 2020-08-06: 10 mL
  Filled 2020-08-06: qty 10

## 2020-08-06 MED ORDER — DIPHENHYDRAMINE HCL 25 MG PO CAPS
50.0000 mg | ORAL_CAPSULE | Freq: Once | ORAL | Status: AC
  Administered 2020-08-06: 50 mg via ORAL

## 2020-08-06 MED ORDER — SODIUM CHLORIDE 0.9% FLUSH
10.0000 mL | INTRAVENOUS | Status: DC | PRN
  Administered 2020-08-06: 10 mL
  Filled 2020-08-06: qty 10

## 2020-08-06 MED ORDER — SODIUM CHLORIDE 0.9 % IV SOLN
60.0000 mg/m2 | Freq: Once | INTRAVENOUS | Status: AC
  Administered 2020-08-06: 140 mg via INTRAVENOUS
  Filled 2020-08-06: qty 14

## 2020-08-06 MED ORDER — PERTUZUMAB CHEMO INJECTION 420 MG/14ML
420.0000 mg | Freq: Once | INTRAVENOUS | Status: AC
  Administered 2020-08-06: 420 mg via INTRAVENOUS
  Filled 2020-08-06: qty 14

## 2020-08-06 MED ORDER — PALONOSETRON HCL INJECTION 0.25 MG/5ML
INTRAVENOUS | Status: AC
  Filled 2020-08-06: qty 5

## 2020-08-06 MED ORDER — SODIUM CHLORIDE 0.9 % IV SOLN
10.0000 mg | Freq: Once | INTRAVENOUS | Status: AC
  Administered 2020-08-06: 10 mg via INTRAVENOUS
  Filled 2020-08-06: qty 10

## 2020-08-06 MED ORDER — SODIUM CHLORIDE 0.9 % IV SOLN
150.0000 mg | Freq: Once | INTRAVENOUS | Status: AC
  Administered 2020-08-06: 150 mg via INTRAVENOUS
  Filled 2020-08-06: qty 150

## 2020-08-06 MED ORDER — SODIUM CHLORIDE 0.9 % IV SOLN
Freq: Once | INTRAVENOUS | Status: AC
  Filled 2020-08-06: qty 250

## 2020-08-06 NOTE — Progress Notes (Signed)
Met with uninsured patient and accompanying adult at registration to provide No [employer] and explained we are required to give to all uninsured patients.  Asked if she had ever completed a CFA application and she states she has not. Gave her the application and advised all supporting documents must be submitted with application.  She has my card for any additional financial questions or concerns.

## 2020-08-06 NOTE — Patient Instructions (Signed)
[facility] Discharge Instructions for Patients Receiving Chemotherapy  Today you received the following chemotherapy agents TCHP (Traztuzumab(Hercerptin) - Pertuzumab(Perjeta) - Docetaxel(Taxol) - Carboplatin)  To help prevent nausea and vomiting after your treatment, we encourage you to take your nausea medication as directed.   If you develop nausea and vomiting that is not controlled by your nausea medication, call the clinic.   BELOW ARE SYMPTOMS THAT SHOULD BE REPORTED IMMEDIATELY:  *FEVER GREATER THAN 100.5 F  *CHILLS WITH OR WITHOUT FEVER  NAUSEA AND VOMITING THAT IS NOT CONTROLLED WITH YOUR NAUSEA MEDICATION  *UNUSUAL SHORTNESS OF BREATH  *UNUSUAL BRUISING OR BLEEDING  TENDERNESS IN MOUTH AND THROAT WITH OR WITHOUT PRESENCE OF ULCERS  *URINARY PROBLEMS  *BOWEL PROBLEMS  UNUSUAL RASH Items with * indicate a potential emergency and should be followed up as soon as possible.  Feel free to call the clinic should you have any questions or concerns. The clinic phone number is [phone].  Please show the CHEMO ALERT CARD at check-in to the Emergency Department and triage nurse.   

## 2020-08-06 NOTE — Progress Notes (Signed)
  Echocardiogram 2D Echocardiogram has been performed.  Courtney Coleman 08/06/2020, 8:28 AM

## 2020-08-06 NOTE — Patient Instructions (Signed)
Implanted Port Insertion, Care After This sheet gives you information about how to care for yourself after your procedure. Your health care provider may also give you more specific instructions. If you have problems or questions, contact your health care provider. What can I expect after the procedure? After the procedure, it is common to have:  Discomfort at the port insertion site.  Bruising on the skin over the port. This should improve over 3-4 days. Follow these instructions at home: Port care  After your port is placed, you will get a manufacturer's information card. The card has information about your port. Keep this card with you at all times.  Take care of the port as told by your health care provider. Ask your health care provider if you or a family member can get training for taking care of the port at home. A home health care nurse may also take care of the port.  Make sure to remember what type of port you have. Incision care  Follow instructions from your health care provider about how to take care of your port insertion site. Make sure you: ? Wash your hands with soap and water before and after you change your bandage (dressing). If soap and water are not available, use hand sanitizer. ? Change your dressing as told by your health care provider. ? Leave stitches (sutures), skin glue, or adhesive strips in place. These skin closures may need to stay in place for 2 weeks or longer. If adhesive strip edges start to loosen and curl up, you may trim the loose edges. Do not remove adhesive strips completely unless your health care provider tells you to do that.  Check your port insertion site every day for signs of infection. Check for: ? Redness, swelling, or pain. ? Fluid or blood. ? Warmth. ? Pus or a bad smell.      Activity  Return to your normal activities as told by your health care provider. Ask your health care provider what activities are safe for you.  Do not  lift anything that is heavier than 10 lb (4.5 kg), or the limit that you are told, until your health care provider says that it is safe. General instructions  Take over-the-counter and prescription medicines only as told by your health care provider.  Do not take baths, swim, or use a hot tub until your health care provider approves. Ask your health care provider if you may take showers. You may only be allowed to take sponge baths.  Do not drive for 24 hours if you were given a sedative during your procedure.  Wear a medical alert bracelet in case of an emergency. This will tell any health care providers that you have a port.  Keep all follow-up visits as told by your health care provider. This is important. Contact a health care provider if:  You cannot flush your port with saline as directed, or you cannot draw blood from the port.  You have a fever or chills.  You have redness, swelling, or pain around your port insertion site.  You have fluid or blood coming from your port insertion site.  Your port insertion site feels warm to the touch.  You have pus or a bad smell coming from the port insertion site. Get help right away if:  You have chest pain or shortness of breath.  You have bleeding from your port that you cannot control. Summary  Take care of the port as told by your   health care provider. Keep the manufacturer's information card with you at all times.  Change your dressing as told by your health care provider.  Contact a health care provider if you have a fever or chills or if you have redness, swelling, or pain around your port insertion site.  Keep all follow-up visits as told by your health care provider. This information is not intended to replace advice given to you by your health care provider. Make sure you discuss any questions you have with your health care provider. Document Revised: 12/12/2017 Document Reviewed: 12/12/2017 Elsevier Patient Education   2021 Elsevier Inc.  

## 2020-08-06 NOTE — Assessment & Plan Note (Signed)
Palpable left breast mass started February 2021 (patient lives in the [place]), went to [place] and mammogram and ultrasound 01/03/2020: 8 cm distortion and calcifications left breast 2:00 and 3 abnormal axillary lymph nodes. 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  Treatment Plan:: 1. Neoadjuvant chemotherapy with [place] Perjeta 6 cycles followed by HerceptinPerjeta versus Kadcylamaintenance for 1 year 2. Followed bymastectomy with the targeted node dissection 3. Followed by adjuvant radiation therapy 4.Followed by antiestrogen therapy and neratinib -------------------------------------------------------------------------------------------------------------------------------- Current Treatment: Cycle5TCHP ECHO 03/28/20: EF 60-65%  Chemo toxicities: 1. 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  2. Fatigue 3. Alopecia 4. Chemo induced anemia: Hemoglobin is 9.5.  Monitoring 5.  Nail discoloration  Return to clinic in 3 weeks for cycle 5

## 2020-08-07 ENCOUNTER — Inpatient Hospital Stay: Payer: Self-pay

## 2020-08-07 ENCOUNTER — Other Ambulatory Visit: Payer: Self-pay

## 2020-08-07 VITALS — BP 148/62 | HR 73 | Resp 18

## 2020-08-07 DIAGNOSIS — C50112 Malignant neoplasm of central portion of left female breast: Secondary | ICD-10-CM

## 2020-08-07 DIAGNOSIS — Z170 Estrogen receptor positive status [ER+]: Secondary | ICD-10-CM

## 2020-08-07 MED ORDER — PEGFILGRASTIM-CBQV 6 MG/0.6ML ~~LOC~~ SOSY
6.0000 mg | PREFILLED_SYRINGE | Freq: Once | SUBCUTANEOUS | Status: AC
  Administered 2020-08-07: 6 mg via SUBCUTANEOUS

## 2020-08-07 MED ORDER — PEGFILGRASTIM-CBQV 6 MG/0.6ML ~~LOC~~ SOSY
PREFILLED_SYRINGE | SUBCUTANEOUS | Status: AC
  Filled 2020-08-07: qty 0.6

## 2020-08-07 NOTE — Patient Instructions (Signed)
Pegfilgrastim injection What is this medicine? PEGFILGRASTIM (PEG fil gra stim) is a long-acting granulocyte colony-stimulating factor that stimulates the growth of neutrophils, a type of white blood cell important in the body's fight against infection. It is used to reduce the incidence of fever and infection in patients with certain types of cancer who are receiving chemotherapy that affects the bone marrow, and to increase survival after being exposed to high doses of radiation. This medicine may be used for other purposes; ask your health care provider or pharmacist if you have questions. COMMON BRAND NAME(S): Fulphila, Neulasta, Nyvepria, UDENYCA, Ziextenzo What should I tell my health care provider before I take this medicine? They need to know if you have any of these conditions:  kidney disease  latex allergy  ongoing radiation therapy  sickle cell disease  skin reactions to acrylic adhesives (On-Body Injector only)  an unusual or allergic reaction to pegfilgrastim, filgrastim, other medicines, foods, dyes, or preservatives  pregnant or trying to get pregnant  breast-feeding How should I use this medicine? This medicine is for injection under the skin. If you get this medicine at home, you will be taught how to prepare and give the pre-filled syringe or how to use the On-body Injector. Refer to the patient Instructions for Use for detailed instructions. Use exactly as directed. Tell your healthcare provider immediately if you suspect that the On-body Injector may not have performed as intended or if you suspect the use of the On-body Injector resulted in a missed or partial dose. It is important that you put your used needles and syringes in a special sharps container. Do not put them in a trash can. If you do not have a sharps container, call your pharmacist or healthcare provider to get one. Talk to your pediatrician regarding the use of this medicine in children. While this drug  may be prescribed for selected conditions, precautions do apply. Overdosage: If you think you have taken too much of this medicine contact a poison control center or emergency room at once. NOTE: This medicine is only for you. Do not share this medicine with others. What if I miss a dose? It is important not to miss your dose. Call your doctor or health care professional if you miss your dose. If you miss a dose due to an On-body Injector failure or leakage, a new dose should be administered as soon as possible using a single prefilled syringe for manual use. What may interact with this medicine? Interactions have not been studied. This list may not describe all possible interactions. Give your health care provider a list of all the medicines, herbs, non-prescription drugs, or dietary supplements you use. Also tell them if you smoke, drink alcohol, or use illegal drugs. Some items may interact with your medicine. What should I watch for while using this medicine? Your condition will be monitored carefully while you are receiving this medicine. You may need blood work done while you are taking this medicine. Talk to your health care provider about your risk of cancer. You may be more at risk for certain types of cancer if you take this medicine. If you are going to need a MRI, CT scan, or other procedure, tell your doctor that you are using this medicine (On-Body Injector only). What side effects may I notice from receiving this medicine? Side effects that you should report to your doctor or health care professional as soon as possible:  allergic reactions (skin rash, itching or hives, swelling of   the face, lips, or tongue)  back pain  dizziness  fever  pain, redness, or irritation at site where injected  pinpoint red spots on the skin  red or dark-brown urine  shortness of breath or breathing problems  stomach or side pain, or pain at the shoulder  swelling  tiredness  trouble  passing urine or change in the amount of urine  unusual bruising or bleeding Side effects that usually do not require medical attention (report to your doctor or health care professional if they continue or are bothersome):  bone pain  muscle pain This list may not describe all possible side effects. Call your doctor for medical advice about side effects. You may report side effects to FDA at 1-800-FDA-1088. Where should I keep my medicine? Keep out of the reach of children. If you are using this medicine at home, you will be instructed on how to store it. Throw away any unused medicine after the expiration date on the label. NOTE: This sheet is a summary. It may not cover all possible information. If you have questions about this medicine, talk to your doctor, pharmacist, or health care provider.  2021 Elsevier/Gold Standard (2019-06-07 13:20:51)  

## 2020-08-08 ENCOUNTER — Ambulatory Visit: Payer: Self-pay

## 2020-08-10 ENCOUNTER — Telehealth: Payer: Self-pay | Admitting: Hematology and Oncology

## 2020-08-10 NOTE — Telephone Encounter (Signed)
Scheduled per 3/10 los. Pt will receive an updated appt calendar

## 2020-08-25 NOTE — Progress Notes (Signed)
Patient Care Team: Kerin Perna, NP as PCP - General (Internal Medicine) Mauro Kaufmann, RN as Oncology Nurse Navigator Rockwell Germany, RN as Oncology Nurse Navigator  DIAGNOSIS:    ICD-10-CM   1. Malignant neoplasm of central portion of left breast in female, estrogen receptor positive ([place])  C50.112    Z17.0     SUMMARY OF ONCOLOGIC HISTORY: Oncology History  Malignant neoplasm of central portion of left breast in female, estrogen receptor positive ([place])   Initial Diagnosis   Palpable left breast mass started February 2021 (patient lives in the [place]), went to [place] and mammogram and ultrasound 01/03/2020: 8 cm distortion and calcifications left breast 2:00 and 3 abnormal axillary lymph nodes.  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 with above-mentioned history of left breast cancercurrently on neoadjuvant chemotherapy with [place].She presents to the clinic todayfora toxicity check and cycle6.   ALLERGIES:  has No Known Allergies.  MEDICATIONS:  Current Outpatient Medications  Medication Sig Dispense Refill  . amiodarone (PACERONE) 200 MG tablet Take 0.5 tablets (100 mg total) by mouth daily. 30 tablet 0  . amLODipine (NORVASC) 5 MG tablet Take 1.5 tablets (7.5 mg total) by mouth daily. 45 tablet 0  . anastrozole (ARIMIDEX) 1 MG tablet Take 1 tablet (1 mg total) by mouth daily. 30 tablet 0  . apixaban (ELIQUIS) 5 MG TABS tablet Take 1 tablet (5 mg total) by mouth 2 (two) times daily. 60 tablet 0  . atorvastatin (LIPITOR) 40 MG tablet Take 1 tablet (40 mg total) by mouth daily. 30 tablet 0  . dexamethasone  (DECADRON) 4 MG tablet Take 1 tablet (4 mg total) by mouth daily. Okay take 1 tablet day before chemo and 1 tablet day after chemo with food 12 tablet 0  . hydrALAZINE (APRESOLINE) 10 MG tablet Take 1 tablet (10 mg total) by mouth every 8 (eight) hours. 90 tablet 0  . lidocaine-prilocaine (EMLA) cream Apply to affected area once 30 g 3  . loperamide (IMODIUM) 2 MG capsule Take 1 capsule (2 mg total) by mouth as needed for diarrhea or loose stools. 60 capsule 1  . ondansetron (ZOFRAN) 8 MG tablet Take 1 tablet (8 mg total) by mouth 2 (two) times daily as needed (Nausea or vomiting). Start on the third day after chemotherapy. 30 tablet 1  . polyethylene glycol (MIRALAX / GLYCOLAX) 17 g packet Take 17 g by mouth 2 (two) times daily. 60 each 0  . polyethylene glycol powder (GLYCOLAX/MIRALAX) 17 GM/SCOOP powder SMARTSIG:1 Capful(s) By Mouth Twice Daily    . prochlorperazine (COMPAZINE) 10 MG tablet Take 1 tablet (10 mg total) by mouth every 6 (six) hours as needed (Nausea or vomiting). 30 tablet 1  . sacubitril-valsartan (ENTRESTO) 49-51 MG Take 1 tablet by mouth 2 (two) times daily. 60 tablet 0   No current facility-administered medications for this visit.    PHYSICAL EXAMINATION: ECOG PERFORMANCE STATUS: 1 - Symptomatic but completely ambulatory  Vitals:   08/26/20 1037  BP: 130/75  Pulse: 71  Resp: 18  Temp: 97.9 F (36.6 C)  SpO2: 100%  Filed Weights   08/26/20 1037  Weight: 229 lb 12.8 oz (104.2 kg)     LABORATORY DATA:  I have reviewed the data as listed CMP Latest Ref Rng & Units 08/26/2020 08/06/2020 07/15/2020  Glucose 70 - 99 mg/dL 114(H) 145(H) 103(H)  BUN 6 - 20 mg/dL 9 9 7   Creatinine 0.44 - 1.00 mg/dL 0.64 0.69 0.70  Sodium 135 - 145 mmol/L 141 141 143  Potassium 3.5 - 5.1 mmol/L 3.6 3.2(L) 3.6  Chloride 98 - 111 mmol/L 104 107 106  CO2 22 - 32 mmol/L 26 27 27   Calcium 8.9 - 10.3 mg/dL 8.4(L) 8.5(L) 8.5(L)  Total Protein 6.5 - 8.1 g/dL 6.8 6.9 6.6  Total Bilirubin  0.3 - 1.2 mg/dL 0.7 0.7 0.8  Alkaline Phos 38 - 126 U/L 67 70 74  AST 15 - 41 U/L 19 18 22   ALT 0 - 44 U/L 12 16 16     Lab Results  Component Value Date   WBC 7.8 08/26/2020   HGB 10.3 (L) 08/26/2020   HCT 32.4 (L) 08/26/2020   MCV 99.7 08/26/2020   PLT 205 08/26/2020   NEUTROABS 4.7 08/26/2020    ASSESSMENT & PLAN:  Malignant neoplasm of central portion of left breast in female, estrogen receptor positive ([place]) Palpable left breast mass started February 2021 (patient lives in the [place]), went to [place] and mammogram and ultrasound 01/03/2020: 8 cm distortion and calcifications left breast 2:00 and 3 abnormal axillary lymph nodes. 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  Treatment Plan:: 1. Neoadjuvant chemotherapy with [place] Perjeta 6 cycles followed by HerceptinPerjeta versus Kadcylamaintenance for 1 year 2. Followed bymastectomy with the targeted node dissection 3. Followed by adjuvant radiation therapy 4.Followed by antiestrogen therapy and neratinib -------------------------------------------------------------------------------------------------------------------------------- Current Treatment: Cycle6TCHP ECHO 03/28/20: EF 60-65%  Chemo toxicities: 1. 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 2. Fatigue 3. Alopecia 4. Chemo induced anemia: Hemoglobin is 10.3. Monitoring 5.Nail discoloration  She has a breast MRI set up on 08/27/2020. She has a appointment with general surgery on 09/01/2020. She will come back in 3 weeks for Herceptin Perjeta maintenance. RTC after surgery to discuss the results    No orders of the defined types were placed in this encounter.  The patient has a good understanding of the overall plan. she agrees with it. she will call with any problems that may develop before the next visit here.  Total time spent: 30 mins including face to face time and time spent for planning,  charting and coordination of care  Rulon Eisenmenger, MD, MPH 08/26/2020  I, Molly Dorshimer, am acting as scribe for Dr. Nicholas Lose.  I have reviewed the above documentation for accuracy and completeness, and I agree with the above.

## 2020-08-25 NOTE — Assessment & Plan Note (Signed)
Palpable left breast mass started February 2021 (patient lives in the [place]), went to [place] and mammogram and ultrasound 01/03/2020: 8 cm distortion and calcifications left breast 2:00 and 3 abnormal axillary lymph nodes. 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  Treatment Plan:: 1. Neoadjuvant chemotherapy with [place] Perjeta 6 cycles followed by HerceptinPerjeta versus Kadcylamaintenance for 1 year 2. Followed bymastectomy with the targeted node dissection 3. Followed by adjuvant radiation therapy 4.Followed by antiestrogen therapy and neratinib -------------------------------------------------------------------------------------------------------------------------------- Current Treatment: Cycle6TCHP ECHO 03/28/20: EF 60-65%  Chemo toxicities: 1. 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 2. Fatigue 3. Alopecia 4. Chemo induced anemia: Hemoglobin is 9.9. Monitoring 5.Nail discoloration  She has a breast MRI set up on 08/27/2020. She has a appointment with general surgery on 09/01/2020.  RTC after surgery to discuss the results

## 2020-08-26 ENCOUNTER — Other Ambulatory Visit: Payer: Self-pay

## 2020-08-26 ENCOUNTER — Inpatient Hospital Stay: Payer: Self-pay

## 2020-08-26 ENCOUNTER — Inpatient Hospital Stay (HOSPITAL_BASED_OUTPATIENT_CLINIC_OR_DEPARTMENT_OTHER): Payer: Self-pay | Admitting: Hematology and Oncology

## 2020-08-26 ENCOUNTER — Encounter: Payer: Self-pay | Admitting: *Deleted

## 2020-08-26 DIAGNOSIS — Z170 Estrogen receptor positive status [ER+]: Secondary | ICD-10-CM

## 2020-08-26 DIAGNOSIS — C50112 Malignant neoplasm of central portion of left female breast: Secondary | ICD-10-CM

## 2020-08-26 DIAGNOSIS — Z95828 Presence of other vascular implants and grafts: Secondary | ICD-10-CM

## 2020-08-26 LAB — CBC WITH DIFFERENTIAL (CANCER CENTER ONLY)
Abs Immature Granulocytes: 0.02 10*3/uL (ref 0.00–0.07)
Basophils Absolute: 0.1 10*3/uL (ref 0.0–0.1)
Basophils Relative: 1 %
Eosinophils Absolute: 0 10*3/uL (ref 0.0–0.5)
Eosinophils Relative: 0 %
HCT: 32.4 % — ABNORMAL LOW (ref 36.0–46.0)
Hemoglobin: 10.3 g/dL — ABNORMAL LOW (ref 12.0–15.0)
Immature Granulocytes: 0 %
Lymphocytes Relative: 33 %
Lymphs Abs: 2.6 10*3/uL (ref 0.7–4.0)
MCH: 31.7 pg (ref 26.0–34.0)
MCHC: 31.8 g/dL (ref 30.0–36.0)
MCV: 99.7 fL (ref 80.0–100.0)
Monocytes Absolute: 0.5 10*3/uL (ref 0.1–1.0)
Monocytes Relative: 6 %
Neutro Abs: 4.7 10*3/uL (ref 1.7–7.7)
Neutrophils Relative %: 60 %
Platelet Count: 205 10*3/uL (ref 150–400)
RBC: 3.25 MIL/uL — ABNORMAL LOW (ref 3.87–5.11)
RDW: 17.5 % — ABNORMAL HIGH (ref 11.5–15.5)
WBC Count: 7.8 10*3/uL (ref 4.0–10.5)
nRBC: 0 % (ref 0.0–0.2)

## 2020-08-26 LAB — CMP (CANCER CENTER ONLY)
ALT: 12 U/L (ref 0–44)
AST: 19 U/L (ref 15–41)
Albumin: 3.2 g/dL — ABNORMAL LOW (ref 3.5–5.0)
Alkaline Phosphatase: 67 U/L (ref 38–126)
Anion gap: 11 (ref 5–15)
BUN: 9 mg/dL (ref 6–20)
CO2: 26 mmol/L (ref 22–32)
Calcium: 8.4 mg/dL — ABNORMAL LOW (ref 8.9–10.3)
Chloride: 104 mmol/L (ref 98–111)
Creatinine: 0.64 mg/dL (ref 0.44–1.00)
GFR, Estimated: >60 mL/min (ref >60)
Glucose, Bld: 114 mg/dL — ABNORMAL HIGH (ref 70–99)
Potassium: 3.6 mmol/L (ref 3.5–5.1)
Sodium: 141 mmol/L (ref 135–145)
Total Bilirubin: 0.7 mg/dL (ref 0.3–1.2)
Total Protein: 6.8 g/dL (ref 6.5–8.1)

## 2020-08-26 MED ORDER — SODIUM CHLORIDE 0.9 % IV SOLN
10.0000 mg | Freq: Once | INTRAVENOUS | Status: AC
  Administered 2020-08-26: 10 mg via INTRAVENOUS
  Filled 2020-08-26: qty 10

## 2020-08-26 MED ORDER — ACETAMINOPHEN 325 MG PO TABS
ORAL_TABLET | ORAL | Status: AC
  Filled 2020-08-26: qty 2

## 2020-08-26 MED ORDER — SODIUM CHLORIDE 0.9 % IV SOLN
600.0000 mg | Freq: Once | INTRAVENOUS | Status: AC
Start: 2020-08-26 — End: 2020-08-26
  Administered 2020-08-26: 600 mg via INTRAVENOUS
  Filled 2020-08-26: qty 28.57

## 2020-08-26 MED ORDER — SODIUM CHLORIDE 0.9% FLUSH
10.0000 mL | Freq: Once | INTRAVENOUS | Status: AC
  Administered 2020-08-26: 10 mL
  Filled 2020-08-26: qty 10

## 2020-08-26 MED ORDER — DIPHENHYDRAMINE HCL 25 MG PO CAPS
ORAL_CAPSULE | ORAL | Status: AC
  Filled 2020-08-26: qty 2

## 2020-08-26 MED ORDER — CARBOPLATIN CHEMO INJECTION 600 MG/60ML
700.0000 mg | Freq: Once | INTRAVENOUS | Status: AC
  Administered 2020-08-26: 700 mg via INTRAVENOUS
  Filled 2020-08-26: qty 70

## 2020-08-26 MED ORDER — PALONOSETRON HCL INJECTION 0.25 MG/5ML
INTRAVENOUS | Status: AC
  Filled 2020-08-26: qty 5

## 2020-08-26 MED ORDER — HEPARIN SOD (PORK) LOCK FLUSH 100 UNIT/ML IV SOLN
500.0000 [IU] | Freq: Once | INTRAVENOUS | Status: AC | PRN
  Administered 2020-08-26: 500 [IU]
  Filled 2020-08-26: qty 5

## 2020-08-26 MED ORDER — SODIUM CHLORIDE 0.9 % IV SOLN
150.0000 mg | Freq: Once | INTRAVENOUS | Status: AC
  Administered 2020-08-26: 150 mg via INTRAVENOUS
  Filled 2020-08-26: qty 150

## 2020-08-26 MED ORDER — SODIUM CHLORIDE 0.9 % IV SOLN
Freq: Once | INTRAVENOUS | Status: AC
  Filled 2020-08-26: qty 250

## 2020-08-26 MED ORDER — ACETAMINOPHEN 325 MG PO TABS
650.0000 mg | ORAL_TABLET | Freq: Once | ORAL | Status: AC
  Administered 2020-08-26: 650 mg via ORAL

## 2020-08-26 MED ORDER — SODIUM CHLORIDE 0.9% FLUSH
10.0000 mL | INTRAVENOUS | Status: DC | PRN
  Administered 2020-08-26: 10 mL
  Filled 2020-08-26: qty 10

## 2020-08-26 MED ORDER — PALONOSETRON HCL INJECTION 0.25 MG/5ML
0.2500 mg | Freq: Once | INTRAVENOUS | Status: AC
  Administered 2020-08-26: 0.25 mg via INTRAVENOUS

## 2020-08-26 MED ORDER — DIPHENHYDRAMINE HCL 25 MG PO CAPS
50.0000 mg | ORAL_CAPSULE | Freq: Once | ORAL | Status: AC
  Administered 2020-08-26: 50 mg via ORAL

## 2020-08-26 MED ORDER — SODIUM CHLORIDE 0.9 % IV SOLN
420.0000 mg | Freq: Once | INTRAVENOUS | Status: AC
  Administered 2020-08-26: 420 mg via INTRAVENOUS
  Filled 2020-08-26: qty 14

## 2020-08-26 MED ORDER — SODIUM CHLORIDE 0.9 % IV SOLN
60.0000 mg/m2 | Freq: Once | INTRAVENOUS | Status: AC
  Administered 2020-08-26: 140 mg via INTRAVENOUS
  Filled 2020-08-26: qty 14

## 2020-08-26 NOTE — Patient Instructions (Signed)
[place] Discharge Instructions for Patients Receiving Chemotherapy  Today you received the following chemotherapy agents TCHP (Traztuzumab(Hercerptin) - Pertuzumab(Perjeta) - Docetaxel(Taxol) - Carboplatin)  To help prevent nausea and vomiting after your treatment, we encourage you to take your nausea medication as directed.   If you develop nausea and vomiting that is not controlled by your nausea medication, call the clinic.   BELOW ARE SYMPTOMS THAT SHOULD BE REPORTED IMMEDIATELY:  *FEVER GREATER THAN 100.5 F  *CHILLS WITH OR WITHOUT FEVER  NAUSEA AND VOMITING THAT IS NOT CONTROLLED WITH YOUR NAUSEA MEDICATION  *UNUSUAL SHORTNESS OF BREATH  *UNUSUAL BRUISING OR BLEEDING  TENDERNESS IN MOUTH AND THROAT WITH OR WITHOUT PRESENCE OF ULCERS  *URINARY PROBLEMS  *BOWEL PROBLEMS  UNUSUAL RASH Items with * indicate a potential emergency and should be followed up as soon as possible.  Feel free to call the clinic should you have any questions or concerns. The clinic phone number is [phone].  Please show the [place] at check-in to the Emergency Department and triage nurse.

## 2020-08-26 NOTE — Patient Instructions (Signed)
Implanted Port Insertion, Care After This sheet gives you information about how to care for yourself after your procedure. Your health care provider may also give you more specific instructions. If you have problems or questions, contact your health care provider. What can I expect after the procedure? After the procedure, it is common to have:  Discomfort at the port insertion site.  Bruising on the skin over the port. This should improve over 3-4 days. Follow these instructions at home: Port care  After your port is placed, you will get a manufacturer's information card. The card has information about your port. Keep this card with you at all times.  Take care of the port as told by your health care provider. Ask your health care provider if you or a family member can get training for taking care of the port at home. A home health care nurse may also take care of the port.  Make sure to remember what type of port you have. Incision care  Follow instructions from your health care provider about how to take care of your port insertion site. Make sure you: ? Wash your hands with soap and water before and after you change your bandage (dressing). If soap and water are not available, use hand sanitizer. ? Change your dressing as told by your health care provider. ? Leave stitches (sutures), skin glue, or adhesive strips in place. These skin closures may need to stay in place for 2 weeks or longer. If adhesive strip edges start to loosen and curl up, you may trim the loose edges. Do not remove adhesive strips completely unless your health care provider tells you to do that.  Check your port insertion site every day for signs of infection. Check for: ? Redness, swelling, or pain. ? Fluid or blood. ? Warmth. ? Pus or a bad smell.      Activity  Return to your normal activities as told by your health care provider. Ask your health care provider what activities are safe for you.  Do not  lift anything that is heavier than 10 lb (4.5 kg), or the limit that you are told, until your health care provider says that it is safe. General instructions  Take over-the-counter and prescription medicines only as told by your health care provider.  Do not take baths, swim, or use a hot tub until your health care provider approves. Ask your health care provider if you may take showers. You may only be allowed to take sponge baths.  Do not drive for 24 hours if you were given a sedative during your procedure.  Wear a medical alert bracelet in case of an emergency. This will tell any health care providers that you have a port.  Keep all follow-up visits as told by your health care provider. This is important. Contact a health care provider if:  You cannot flush your port with saline as directed, or you cannot draw blood from the port.  You have a fever or chills.  You have redness, swelling, or pain around your port insertion site.  You have fluid or blood coming from your port insertion site.  Your port insertion site feels warm to the touch.  You have pus or a bad smell coming from the port insertion site. Get help right away if:  You have chest pain or shortness of breath.  You have bleeding from your port that you cannot control. Summary  Take care of the port as told by your   health care provider. Keep the manufacturer's information card with you at all times.  Change your dressing as told by your health care provider.  Contact a health care provider if you have a fever or chills or if you have redness, swelling, or pain around your port insertion site.  Keep all follow-up visits as told by your health care provider. This information is not intended to replace advice given to you by your health care provider. Make sure you discuss any questions you have with your health care provider. Document Revised: 12/12/2017 Document Reviewed: 12/12/2017 Elsevier Patient Education   2021 Elsevier Inc.  

## 2020-08-27 ENCOUNTER — Ambulatory Visit
Admission: RE | Admit: 2020-08-27 | Discharge: 2020-08-27 | Disposition: A | Discharge status: Home / Self Care | Payer: Self-pay | Source: Ambulatory Visit | Attending: Hematology and Oncology | Admitting: Hematology and Oncology

## 2020-08-27 ENCOUNTER — Encounter: Payer: Self-pay | Admitting: *Deleted

## 2020-08-27 ENCOUNTER — Inpatient Hospital Stay: Payer: Self-pay

## 2020-08-27 VITALS — BP 150/75 | HR 80 | Temp 98.3°F | Resp 19

## 2020-08-27 DIAGNOSIS — C50112 Malignant neoplasm of central portion of left female breast: Secondary | ICD-10-CM

## 2020-08-27 DIAGNOSIS — Z170 Estrogen receptor positive status [ER+]: Secondary | ICD-10-CM

## 2020-08-27 IMAGING — MR MR BREAST BILAT WO/W CM
8 of 12 series · 32 of 48 positions shown · IV contrast (9 ML GADAVIST)
Comparison: MRI [DATE]

CLINICAL DATA: Known left-sided breast cancer. Assess treatment
response after chemotherapy.

LABS:  None
EXAM:
BILATERAL BREAST MRI WITH AND WITHOUT CONTRAST
TECHNIQUE: Multiplanar, multisequence MR images of both breasts were obtained
prior to and following the intravenous administration of 9 ml of
Gadavist

[Series 2: t2_tirm_tra ipat (a-p) · axial · 3.0mm · 0.78mm/px · 1 of 55 slices shown]
[im 1/55]
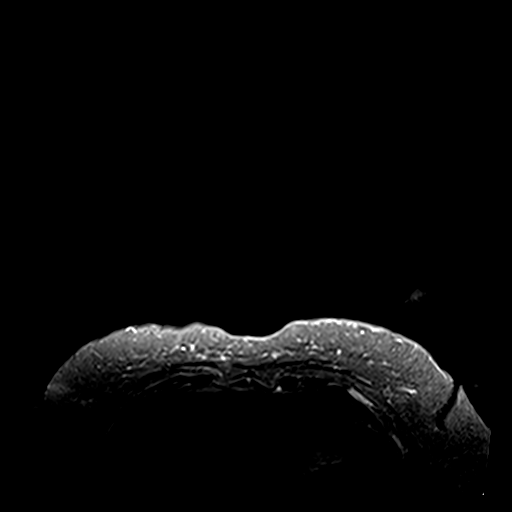

[Series 3: fl3d pre-cm no · axial · non-contrast · 1.2mm · 1.04mm/px · z∈[-56,+115]mm · 5 of 144 slices shown]
[im 1/144]
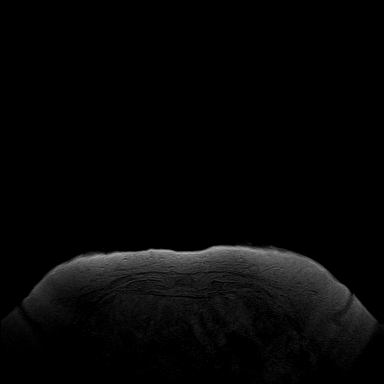
[im 36/144]
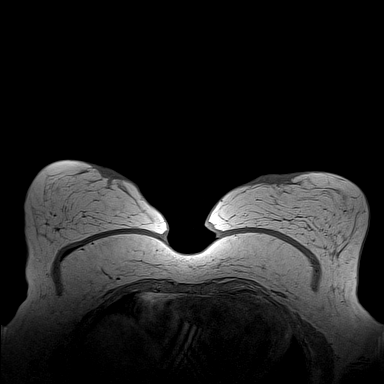
[im 72/144]
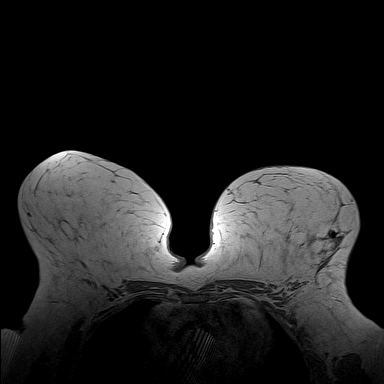
[im 108/144]
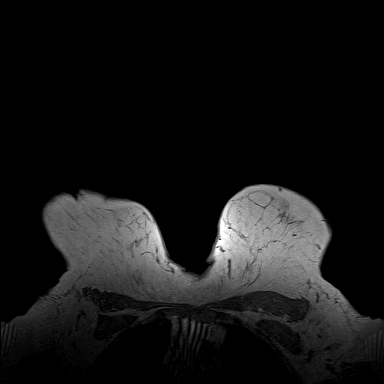
[im 144/144]
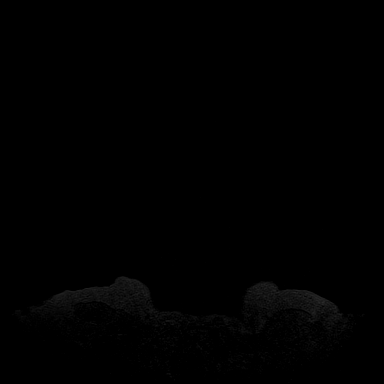

[Series 4: fl3d pre-cm · axial · non-contrast · 1.2mm · 1.04mm/px · z∈[-56,+115]mm · 5 of 144 slices shown]
[im 1/144]
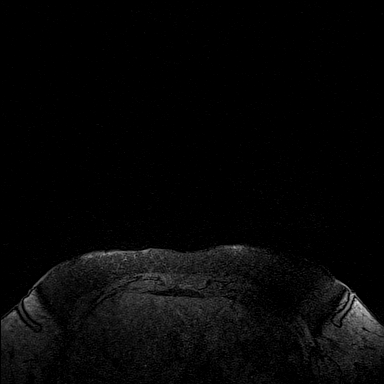
[im 36/144]
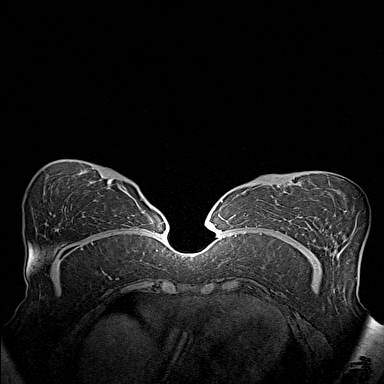
[im 72/144]
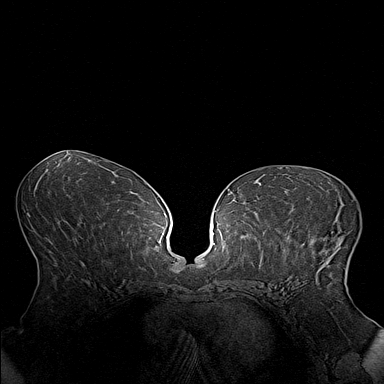
[im 108/144]
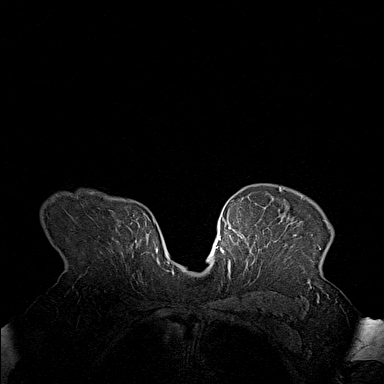
[im 144/144]
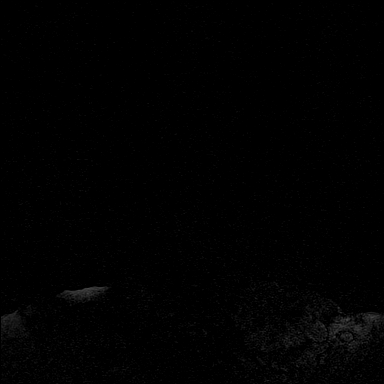

[Series 5: fl3d post-cm 20 · axial · 1.2mm · 1.04mm/px · z∈[-56,+115]mm · 5 of 144 slices shown (1 of 3)]
[im 1/144]
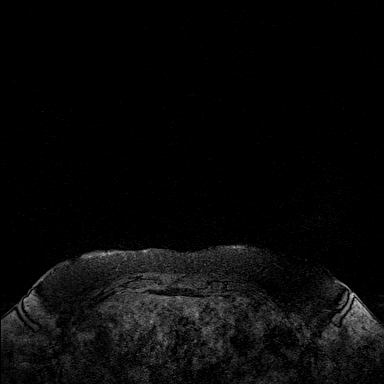
[im 36/144]
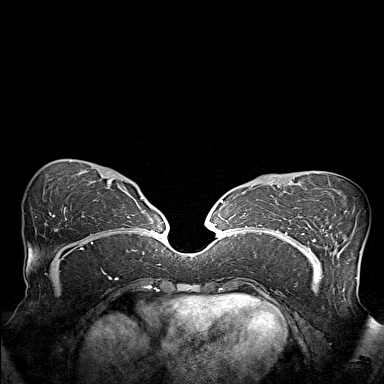
[im 72/144]
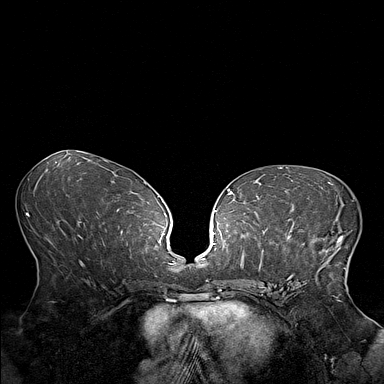
[im 108/144]
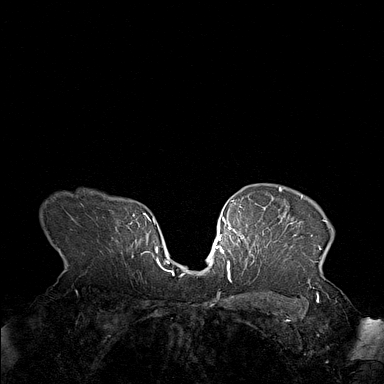
[im 144/144]
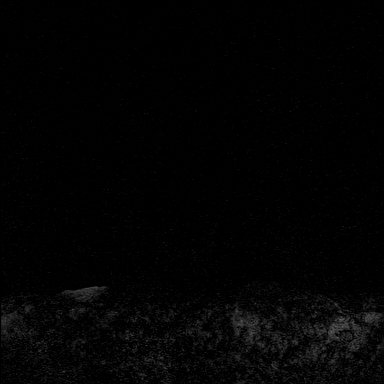

[Series 6: fl3d post-cm 20 · axial · 1.2mm · 1.04mm/px · z∈[-56,+115]mm · 5 of 144 slices shown (2 of 3)]
[im 1/144]
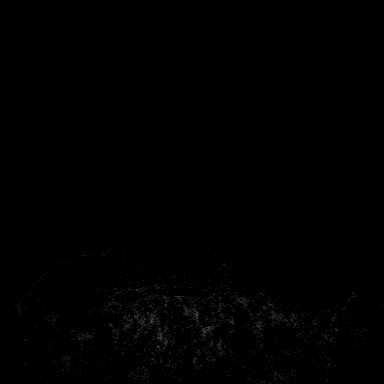
[im 36/144]
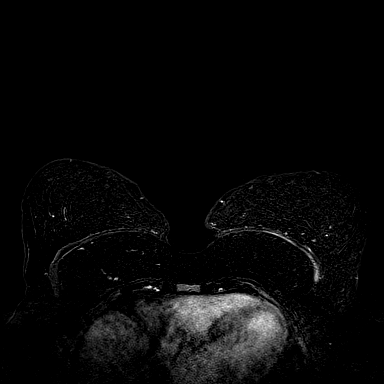
[im 72/144]
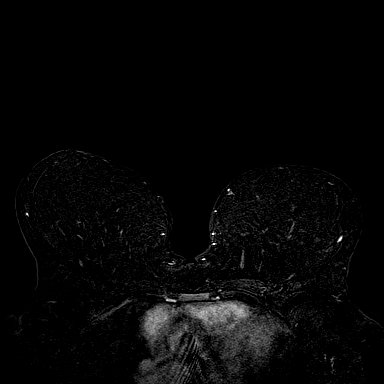
[im 108/144]
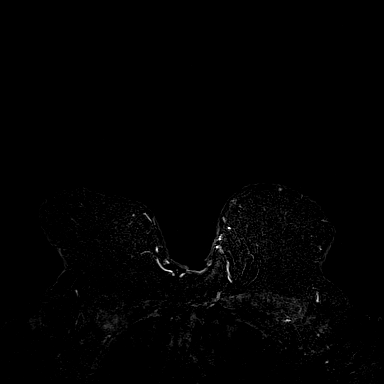
[im 144/144]
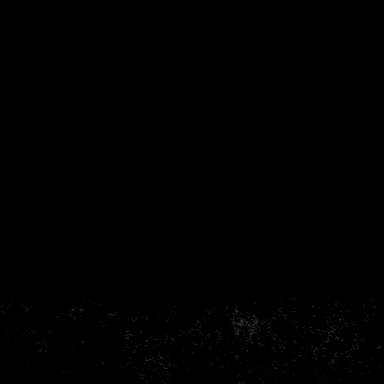

[Series 7: fl3d post-cm 20 · axial · 172.8mm · 1.04mm/px · 1 of 1 slices shown (3 of 3)]
[im 1/1]
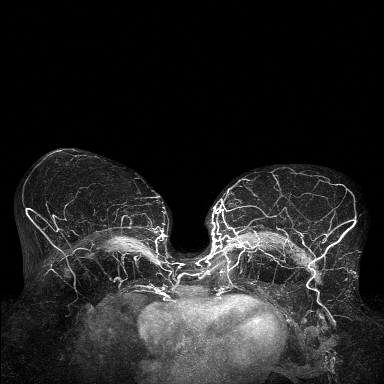

[Series 8: fl3d post-cm 3min · axial · 1.2mm · 1.04mm/px · z∈[-56,+115]mm · 6 of 144 slices shown]
[im 1/144]
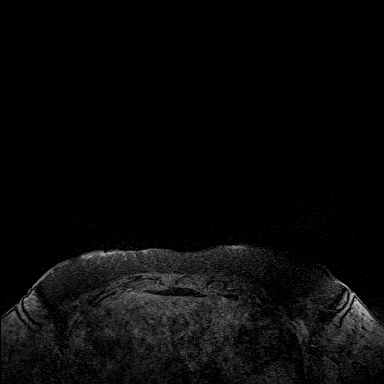
[im 29/144]
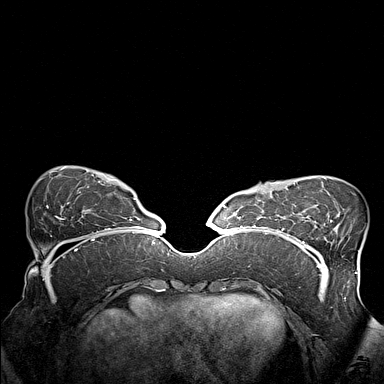
[im 58/144]
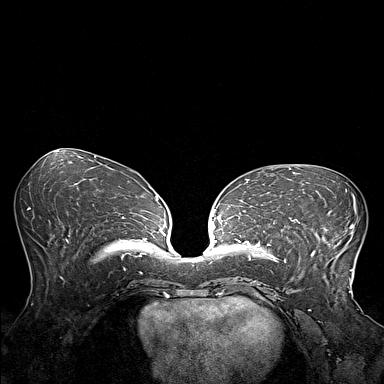
[im 86/144]
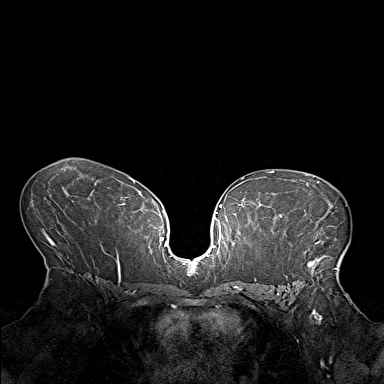
[im 115/144]
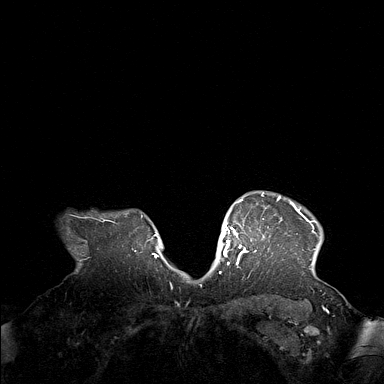
[im 144/144]
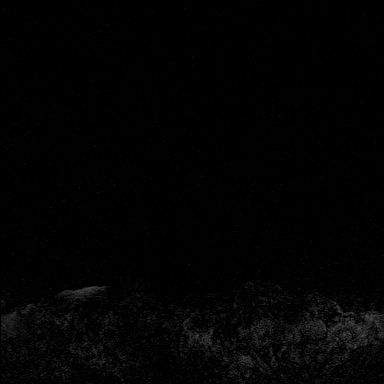

[Series 9: fl3d post-cm 3min_sub · axial · 1.2mm · 1.04mm/px · z∈[-56,+46]mm · 4 of 144 slices shown]
[im 1/144]
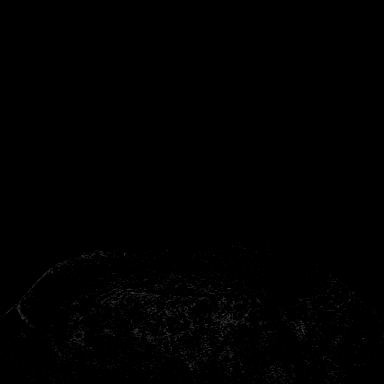
[im 29/144]
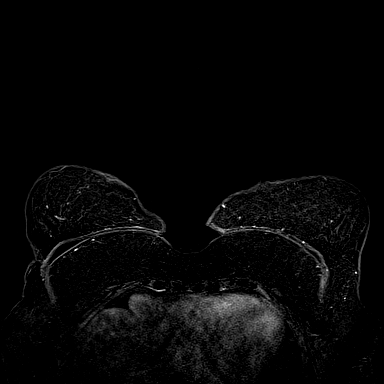
[im 58/144]
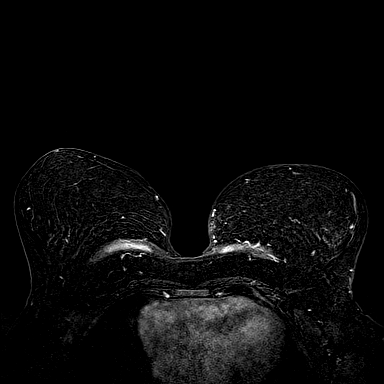
[im 86/144]
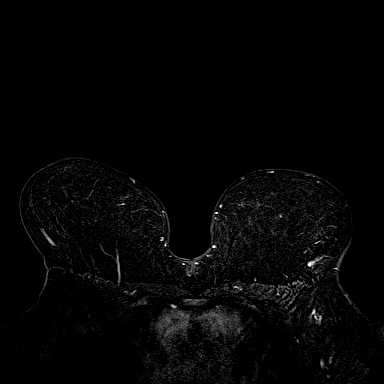

[32 of 48 positions shown; findings below may reference images not displayed]

Three-dimensional MR images were rendered by post-processing of the
original MR data on an independent workstation. The
three-dimensional MR images were interpreted, and findings are
reported in the following complete MRI report for this study. Three
dimensional images were evaluated at the independent interpreting
workstation using the DynaCAD thin client.
FINDINGS: Breast composition: b. Scattered fibroglandular tissue.

Background parenchymal enhancement: Minimal

Right breast: No mass or abnormal enhancement.

Left breast: There is no significant enhancement in the region of
the patient's known malignancy on the 20 second delayed images.
There is very mild wispy enhancement in the region of the previous
biopsy and known malignancy on 3 minutes delayed and 5 minutes
delayed images. The remaining enhancement is very mild and wispy
making it difficult to confidently measure. The mild enhancement
remaining on delayed images measures approximately 3.6 by 3.0 cm in
AP and transverse dimension today versus 6 x 3.7 cm previously.

Lymph nodes: The previously biopsied lymph node in the left axilla
is normal in appearance without cortical thickening. A more
prominent node in the superior left axilla demonstrates a nodular
contour with a cortex measuring up to 1 cm in thickness, unchanged.
No other adenopathy.

Ancillary findings:  None.
IMPRESSION: 1. There has been significant interval improvement of the patient's
known malignancy. There is no remaining enhancement in this region
on 20 second delayed images and very mild wispy ill-defined
enhancement in this region on delayed images. The remaining
enhancement measures up to 3.6 x 3.0 cm today versus 6.0 x 3.7 cm
previously.
2. The previously biopsied left axillary lymph node is normal in
appearance. There is a mildly prominent lymph node in the superior
left axilla with a nodular contour and cortex measuring up to 1 cm,
unchanged.
3. No MRI evidence of malignancy in the right breast.

RECOMMENDATION:
Recommend continued surgical and oncologic follow up.

BI-RADS CATEGORY  6: Known biopsy-proven malignancy.

## 2020-08-27 MED ORDER — GADOBUTROL 1 MMOL/ML IV SOLN
9.0000 mL | Freq: Once | INTRAVENOUS | Status: AC | PRN
  Administered 2020-08-27: 9 mL via INTRAVENOUS

## 2020-08-27 MED ORDER — PEGFILGRASTIM-CBQV 6 MG/0.6ML ~~LOC~~ SOSY
PREFILLED_SYRINGE | SUBCUTANEOUS | Status: AC
  Filled 2020-08-27: qty 0.6

## 2020-08-27 MED ORDER — PEGFILGRASTIM-CBQV 6 MG/0.6ML ~~LOC~~ SOSY
6.0000 mg | PREFILLED_SYRINGE | Freq: Once | SUBCUTANEOUS | Status: AC
  Administered 2020-08-27: 6 mg via SUBCUTANEOUS

## 2020-08-28 ENCOUNTER — Ambulatory Visit: Payer: Self-pay

## 2020-08-31 LAB — SURGICAL PATHOLOGY

## 2020-09-10 ENCOUNTER — Other Ambulatory Visit: Payer: Self-pay | Admitting: Hematology and Oncology

## 2020-09-10 DIAGNOSIS — Z170 Estrogen receptor positive status [ER+]: Secondary | ICD-10-CM

## 2020-09-10 DIAGNOSIS — C50112 Malignant neoplasm of central portion of left female breast: Secondary | ICD-10-CM

## 2020-09-16 ENCOUNTER — Encounter: Payer: Self-pay | Admitting: *Deleted

## 2020-09-17 ENCOUNTER — Other Ambulatory Visit: Payer: Self-pay

## 2020-09-17 ENCOUNTER — Inpatient Hospital Stay: Payer: Self-pay | Attending: Hematology and Oncology

## 2020-09-17 VITALS — BP 131/65 | HR 68 | Temp 98.0°F | Resp 18 | Wt 229.8 lb

## 2020-09-17 DIAGNOSIS — C50112 Malignant neoplasm of central portion of left female breast: Secondary | ICD-10-CM | POA: Insufficient documentation

## 2020-09-17 DIAGNOSIS — Z79899 Other long term (current) drug therapy: Secondary | ICD-10-CM | POA: Insufficient documentation

## 2020-09-17 DIAGNOSIS — Z5112 Encounter for antineoplastic immunotherapy: Secondary | ICD-10-CM | POA: Insufficient documentation

## 2020-09-17 MED ORDER — DIPHENHYDRAMINE HCL 25 MG PO CAPS
50.0000 mg | ORAL_CAPSULE | Freq: Once | ORAL | Status: AC
  Administered 2020-09-17: 50 mg via ORAL

## 2020-09-17 MED ORDER — DIPHENHYDRAMINE HCL 25 MG PO CAPS
ORAL_CAPSULE | ORAL | Status: AC
  Filled 2020-09-17: qty 2

## 2020-09-17 MED ORDER — SODIUM CHLORIDE 0.9 % IV SOLN
Freq: Once | INTRAVENOUS | Status: AC
  Filled 2020-09-17: qty 250

## 2020-09-17 MED ORDER — HEPARIN SOD (PORK) LOCK FLUSH 100 UNIT/ML IV SOLN
500.0000 [IU] | Freq: Once | INTRAVENOUS | Status: AC | PRN
  Administered 2020-09-17: 500 [IU]
  Filled 2020-09-17: qty 5

## 2020-09-17 MED ORDER — TRASTUZUMAB CHEMO 150 MG IV SOLR
600.0000 mg | Freq: Once | INTRAVENOUS | Status: AC
  Administered 2020-09-17: 600 mg via INTRAVENOUS
  Filled 2020-09-17: qty 28.57

## 2020-09-17 MED ORDER — SODIUM CHLORIDE 0.9 % IV SOLN
420.0000 mg | Freq: Once | INTRAVENOUS | Status: AC
  Administered 2020-09-17: 420 mg via INTRAVENOUS
  Filled 2020-09-17: qty 14

## 2020-09-17 MED ORDER — ACETAMINOPHEN 325 MG PO TABS
650.0000 mg | ORAL_TABLET | Freq: Once | ORAL | Status: AC
  Administered 2020-09-17: 650 mg via ORAL

## 2020-09-17 MED ORDER — ACETAMINOPHEN 325 MG PO TABS
ORAL_TABLET | ORAL | Status: AC
  Filled 2020-09-17: qty 2

## 2020-09-17 MED ORDER — SODIUM CHLORIDE 0.9% FLUSH
10.0000 mL | INTRAVENOUS | Status: DC | PRN
  Administered 2020-09-17: 10 mL
  Filled 2020-09-17: qty 10

## 2020-09-17 NOTE — Patient Instructions (Signed)
[facility] Discharge Instructions for Patients Receiving Chemotherapy  Today you received the following chemotherapy agents herceptin, perjeta   To help prevent nausea and vomiting after your treatment, we encourage you to take your nausea medication as directed.   If you develop nausea and vomiting that is not controlled by your nausea medication, call the clinic.   BELOW ARE SYMPTOMS THAT SHOULD BE REPORTED IMMEDIATELY:  *FEVER GREATER THAN 100.5 F  *CHILLS WITH OR WITHOUT FEVER  NAUSEA AND VOMITING THAT IS NOT CONTROLLED WITH YOUR NAUSEA MEDICATION  *UNUSUAL SHORTNESS OF BREATH  *UNUSUAL BRUISING OR BLEEDING  TENDERNESS IN MOUTH AND THROAT WITH OR WITHOUT PRESENCE OF ULCERS  *URINARY PROBLEMS  *BOWEL PROBLEMS  UNUSUAL RASH Items with * indicate a potential emergency and should be followed up as soon as possible.  Feel free to call the clinic should you have any questions or concerns. The clinic phone number is [phone].  Please show the [facility] at check-in to the Emergency Department and triage nurse.

## 2020-09-23 DIAGNOSIS — I10 Essential (primary) hypertension: Secondary | ICD-10-CM | POA: Insufficient documentation

## 2020-09-23 DIAGNOSIS — C50919 Malignant neoplasm of unspecified site of unspecified female breast: Secondary | ICD-10-CM | POA: Insufficient documentation

## 2020-09-23 DIAGNOSIS — I499 Cardiac arrhythmia, unspecified: Secondary | ICD-10-CM | POA: Insufficient documentation

## 2020-09-29 ENCOUNTER — Encounter (HOSPITAL_COMMUNITY): Payer: Self-pay | Admitting: Emergency Medicine

## 2020-09-29 ENCOUNTER — Telehealth (INDEPENDENT_AMBULATORY_CARE_PROVIDER_SITE_OTHER): Payer: Self-pay | Admitting: Cardiology

## 2020-09-29 ENCOUNTER — Telehealth: Payer: Self-pay

## 2020-09-29 ENCOUNTER — Other Ambulatory Visit (HOSPITAL_COMMUNITY): Payer: Self-pay

## 2020-09-29 ENCOUNTER — Other Ambulatory Visit: Payer: Self-pay

## 2020-09-29 ENCOUNTER — Ambulatory Visit (HOSPITAL_COMMUNITY)
Admission: EM | Admit: 2020-09-29 | Discharge: 2020-09-29 | Disposition: A | Discharge status: Home / Self Care | Payer: Self-pay | Attending: Student | Admitting: Student

## 2020-09-29 ENCOUNTER — Encounter: Payer: Self-pay | Admitting: Cardiology

## 2020-09-29 DIAGNOSIS — Z01810 Encounter for preprocedural cardiovascular examination: Secondary | ICD-10-CM

## 2020-09-29 DIAGNOSIS — Z760 Encounter for issue of repeat prescription: Secondary | ICD-10-CM

## 2020-09-29 DIAGNOSIS — I480 Paroxysmal atrial fibrillation: Secondary | ICD-10-CM

## 2020-09-29 DIAGNOSIS — I6930 Unspecified sequelae of cerebral infarction: Secondary | ICD-10-CM

## 2020-09-29 DIAGNOSIS — I10 Essential (primary) hypertension: Secondary | ICD-10-CM

## 2020-09-29 DIAGNOSIS — C50919 Malignant neoplasm of unspecified site of unspecified female breast: Secondary | ICD-10-CM

## 2020-09-29 DIAGNOSIS — I509 Heart failure, unspecified: Secondary | ICD-10-CM

## 2020-09-29 MED ORDER — APIXABAN 5 MG PO TABS
5.0000 mg | ORAL_TABLET | Freq: Two times a day (BID) | ORAL | 3 refills | Status: DC
  Filled 2020-09-29: qty 180, 90d supply, fill #0

## 2020-09-29 MED ORDER — AMIODARONE HCL 200 MG PO TABS
100.0000 mg | ORAL_TABLET | Freq: Every day | ORAL | 2 refills | Status: DC
  Filled 2020-09-29 – 2020-09-30 (×3): qty 15, 30d supply, fill #0

## 2020-09-29 MED ORDER — ATORVASTATIN CALCIUM 40 MG PO TABS
40.0000 mg | ORAL_TABLET | Freq: Every day | ORAL | 2 refills | Status: DC
  Filled 2020-09-29 – 2020-09-30 (×3): qty 30, 30d supply, fill #0

## 2020-09-29 MED ORDER — HYDRALAZINE HCL 10 MG PO TABS
10.0000 mg | ORAL_TABLET | Freq: Three times a day (TID) | ORAL | 2 refills | Status: DC
  Filled 2020-09-29 – 2020-09-30 (×3): qty 90, 30d supply, fill #0

## 2020-09-29 MED ORDER — ATORVASTATIN CALCIUM 40 MG PO TABS
40.0000 mg | ORAL_TABLET | Freq: Every day | ORAL | 3 refills | Status: DC
  Filled 2020-09-29: qty 90, 90d supply, fill #0

## 2020-09-29 MED ORDER — HYDRALAZINE HCL 10 MG PO TABS
10.0000 mg | ORAL_TABLET | Freq: Three times a day (TID) | ORAL | 3 refills | Status: DC
  Filled 2020-09-29: qty 270, 90d supply, fill #0

## 2020-09-29 MED ORDER — SACUBITRIL-VALSARTAN 49-51 MG PO TABS
1.0000 | ORAL_TABLET | Freq: Two times a day (BID) | ORAL | 2 refills | Status: DC
  Filled 2020-09-29 (×2): qty 60, 30d supply, fill #0

## 2020-09-29 MED ORDER — APIXABAN 5 MG PO TABS
5.0000 mg | ORAL_TABLET | Freq: Two times a day (BID) | ORAL | 2 refills | Status: DC
  Filled 2020-09-29 (×2): qty 60, 30d supply, fill #0

## 2020-09-29 MED ORDER — AMLODIPINE BESYLATE 5 MG PO TABS
7.5000 mg | ORAL_TABLET | Freq: Every day | ORAL | 3 refills | Status: DC
  Filled 2020-09-29: qty 135, 90d supply, fill #0

## 2020-09-29 MED ORDER — AMIODARONE HCL 200 MG PO TABS
100.0000 mg | ORAL_TABLET | Freq: Every day | ORAL | 3 refills | Status: DC
  Filled 2020-09-29: qty 45, 90d supply, fill #0

## 2020-09-29 MED ORDER — AMLODIPINE BESYLATE 5 MG PO TABS
7.5000 mg | ORAL_TABLET | Freq: Every day | ORAL | 2 refills | Status: DC
  Filled 2020-09-29 – 2020-09-30 (×3): qty 45, 30d supply, fill #0

## 2020-09-29 MED ORDER — SACUBITRIL-VALSARTAN 49-51 MG PO TABS
1.0000 | ORAL_TABLET | Freq: Two times a day (BID) | ORAL | 3 refills | Status: DC
  Filled 2020-09-29: qty 60, 30d supply, fill #0

## 2020-09-29 MED ORDER — ANASTROZOLE 1 MG PO TABS
1.0000 mg | ORAL_TABLET | Freq: Every day | ORAL | 2 refills | Status: DC
  Filled 2020-09-29 – 2020-09-30 (×3): qty 30, 30d supply, fill #0

## 2020-09-29 NOTE — Progress Notes (Addendum)
Virtual Visit via Telephone Note   This visit type was conducted due to national recommendations for restrictions regarding the COVID-19 Pandemic (e.g. social distancing) in an effort to limit this patient's exposure and mitigate transmission in our community.  Due to her co-morbid illnesses, this patient is at least at moderate risk for complications without adequate follow up.  This format is felt to be most appropriate for this patient at this time.  The patient did not have access to video technology/had technical difficulties with video requiring transitioning to audio format only (telephone).  All issues noted in this document were discussed and addressed.  No physical exam could be performed with this format.  Please refer to the patient's chart for her  consent to telehealth for [facility]    Date:  09/29/2020   ID:  Courtney Coleman, DOB 01-14-1966, MRN [id number] The patient was identified using 2 identifiers.  Patient Location: Home Provider Location: Home Office   PCP:  Kerin Perna, NP   [facility] Providers Cardiologist:  None     Evaluation Performed:  Consultation - Katlynn Naser was referred by her physician for the evaluation of Assessment preoperatively.  Chief Complaint: Preop assessment  History of Present Illness:    Courtney Coleman is a 55 y.o. female with past medical history of what appearsto be atrial fibrillation.  She is a poor historian.  She mentions to me that her heart was racing at another hospital in [place] for which she was given medicines and subsequently she has done fine.  Currently denies any symptoms of chest pain orthopnea or PND and feels fine and walks more than half an hour on a regular basis.  At the time of my evaluation, the patient is alert awake oriented and in no distress.  The patient does not have symptoms concerning for COVID-19 infection (fever, chills, cough, or new shortness of breath).    Past Medical History:   Diagnosis Date  . Acute blood loss anemia   . Alteration of sensations, post-stroke   . Anemia 03/28/2020  . Benign essential HTN   . Bradycardia   . Breast cancer ([place])   . Cerebral embolism with cerebral infarction 03/28/2020  . CHF (congestive heart failure) ([place])   . Controlled type 2 diabetes mellitus with hyperglycemia, without long-term current use of insulin ([place])   . CVA (cerebral vascular accident) ([place]) 03/29/2020  . Dysesthesia   . Dysrhythmia   . Essential hypertension   . Hypertension   . Malignant neoplasm of central portion of left breast in female, estrogen receptor positive ([place])   . PAF (paroxysmal atrial fibrillation) ([place]) 03/28/2020  . Port-A-Cath in place 05/14/2020  . Right foot drop 03/28/2020  . Right hemiparesis ([place])   . Slow transit constipation   . 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 BREAST BIOPSY    . IR IMAGING GUIDED PORT INSERTION  05/06/2020     Current Meds  Medication Sig  . amiodarone (PACERONE) 200 MG tablet Take 100 mg by mouth daily.  Marland Kitchen amLODipine (NORVASC) 5 MG tablet Take 7.5 mg by mouth daily.  Marland Kitchen anastrozole (ARIMIDEX) 1 MG tablet Take 1 mg by mouth daily.  Marland Kitchen apixaban (ELIQUIS) 5 MG TABS tablet Take 5 mg by mouth 2 (two) times daily.  Marland Kitchen atorvastatin (LIPITOR) 40 MG tablet Take 40 mg by mouth daily.  Marland Kitchen dexamethasone (DECADRON) 4 MG tablet Take 4 mg by mouth daily.  Marland Kitchen  hydrALAZINE (APRESOLINE) 10 MG tablet Take 10 mg by mouth every 8 (eight) hours.  Marland Kitchen loperamide (IMODIUM) 2 MG capsule Take 1 capsule (2 mg total) by mouth as needed for diarrhea or loose stools.  . ondansetron (ZOFRAN) 8 MG tablet Take 8 mg by mouth 2 (two) times daily as needed for nausea/vomiting.  . polyethylene glycol (MIRALAX / GLYCOLAX) 17 g packet Take 17 g by mouth 2 (two) times daily.  . prochlorperazine (COMPAZINE) 10 MG tablet Take 10 mg by mouth every 6 (six) hours as needed for nausea/vomiting.  .  sacubitril-valsartan (ENTRESTO) 49-51 MG Take 1 tablet by mouth 2 (two) times daily.     Allergies:   Patient has no known allergies.   Social History   Tobacco Use  . Smoking status: Never Smoker  . Smokeless tobacco: Never Used  Substance Use Topics  . Alcohol use: Not Currently  . Drug use: Never     Family Hx: The patient's family history includes Diabetes in her father; Heart disease in her mother. There is no history of Cancer.  ROS:   Please see the history of present illness.    I discussed my findings with the patient at length All other systems reviewed and are negative.   Prior CV studies:   The following studies were reviewed today:  I am awaiting records from the cardiologist  Labs/Other Tests and Data Reviewed:    EKG:  No ECG reviewed.  Recent Labs: 03/29/2020: Magnesium 1.7 04/07/2020: TSH 2.026 08/26/2020: ALT 12; BUN 9; Creatinine 0.64; Hemoglobin 10.3; Platelet Count 205; Potassium 3.6; Sodium 141   Recent Lipid Panel Lab Results  Component Value Date/Time   CHOL 142 04/07/2020 06:49 AM   TRIG 47 04/07/2020 06:49 AM   HDL 48 04/07/2020 06:49 AM   CHOLHDL 3.0 04/07/2020 06:49 AM   LDLCALC 85 04/07/2020 06:49 AM    Wt Readings from Last 3 Encounters:  09/29/20 229 lb (103.9 kg)  09/17/20 229 lb 12 oz (104.2 kg)  08/26/20 229 lb 12.8 oz (104.2 kg)     Risk Assessment/Calculations:    CHA2DS2-VASc Score =    This indicates a  % annual risk of stroke. The patient's score is based upon:       Objective:    Vital Signs:  Ht 5\' 5"  (1.651 m)   Wt 229 lb (103.9 kg)   BMI 38.11 kg/m    VITAL SIGNS:  reviewed  ASSESSMENT & PLAN:    1. Preoperative cardiovascular assessment: I discussed my findings with the patient in extensive.  Echocardiogram report was reviewed.  This is unremarkable.  She has good effort tolerance 2. Patient on anticoagulation and amiodarone therapy.  I am not sure about the results of this.  I will get records from  her cardiologist in [place] and review them extensively.  I will also get the patient in for an EKG in the next few days. 3. She is a regular and metabolic he is anemic and will think she is at high risk for coronary events for the planned breast surgery.  My final recommendations will be made after review of those records which been trying to get a soon as possible.  Patient had multiple questions which were answered to her satisfaction.   Addendum: I reviewed records from the other hospital and cardiologist's office.  Patient had paroxysmal atrial fibrillation and underwent TEE guided cardioversion.  Also coronary angiography was normal.  In view of these findings and the fact that she  has good effort tolerance is not at high risk for coronary events during the aforementioned procedure.  Medical hemodynamic monitoring will further reduce the risk of coronary events.  Please do not hesitate to call us with any questions in a cardiovascular management. Signed Dr. Sunny Schlein [facility] 10/27/2020.        COVID-19 Education: The signs and symptoms of COVID-19 were discussed with the patient and how to seek care for testing (follow up with PCP or arrange E-visit).  The importance of social distancing was discussed today.  Time:   Today, I have spent 15 minutes with the patient with telehealth technology discussing the above problems.     Medication Adjustments/Labs and Tests Ordered: Current medicines are reviewed at length with the patient today.  Concerns regarding medicines are outlined above.   Tests Ordered: No orders of the defined types were placed in this encounter.   Medication Changes: No orders of the defined types were placed in this encounter.   Follow Up:  In Person in 2 month(s)  Signed, Jenean Lindau, MD  09/29/2020 10:24 AM    [place]

## 2020-09-29 NOTE — Addendum Note (Signed)
Addended by: Resa Miner I on: 09/29/2020 03:12 PM   Modules accepted: Orders

## 2020-09-29 NOTE — Discharge Instructions (Addendum)
Medications refilled- all for 1 month with 2 refills.

## 2020-09-29 NOTE — Telephone Encounter (Signed)
Sent refills of cardiac meds and spoke with husband verifying appointment for patient's EKG on Monday.

## 2020-09-29 NOTE — Telephone Encounter (Signed)
  Patient Consent for Virtual Visit         Courtney Coleman has provided verbal consent on 09/29/2020 for a virtual visit (video or telephone).   CONSENT FOR VIRTUAL VISIT FOR:  Courtney Coleman  By participating in this virtual visit I agree to the following:  I hereby voluntarily request, consent and authorize [facility] and its employed or contracted physicians, physician assistants, nurse practitioners or other licensed health care professionals (the Practitioner), to provide me with telemedicine health care services (the "Services") as deemed necessary by the treating Practitioner. I acknowledge and consent to receive the Services by the Practitioner via telemedicine. I understand that the telemedicine visit will involve communicating with the Practitioner through live audiovisual communication technology and the disclosure of certain medical information by electronic transmission. I acknowledge that I have been given the opportunity to request an in-person assessment or other available alternative prior to the telemedicine visit and am voluntarily participating in the telemedicine visit.  I understand that I have the right to withhold or withdraw my consent to the use of telemedicine in the course of my care at any time, without affecting my right to future care or treatment, and that the Practitioner or I may terminate the telemedicine visit at any time. I understand that I have the right to inspect all information obtained and/or recorded in the course of the telemedicine visit and may receive copies of available information for a reasonable fee.  I understand that some of the potential risks of receiving the Services via telemedicine include:  Marland Kitchen Delay or interruption in medical evaluation due to technological equipment failure or disruption; . Information transmitted may not be sufficient (e.g. poor resolution of images) to allow for appropriate medical decision making by the Practitioner; and/or   . In rare instances, security protocols could fail, causing a breach of personal health information.  Furthermore, I acknowledge that it is my responsibility to provide information about my medical history, conditions and care that is complete and accurate to the best of my ability. I acknowledge that Practitioner's advice, recommendations, and/or decision may be based on factors not within their control, such as incomplete or inaccurate data provided by me or distortions of diagnostic images or specimens that may result from electronic transmissions. I understand that the practice of medicine is not an exact science and that Practitioner makes no warranties or guarantees regarding treatment outcomes. I acknowledge that a copy of this consent can be made available to me via my patient portal ([facility]), or I can request a printed copy by calling the office of [facility].    I understand that my insurance will be billed for this visit.   I have read or had this consent read to me. . I understand the contents of this consent, which adequately explains the benefits and risks of the Services being provided via telemedicine.  . I have been provided ample opportunity to ask questions regarding this consent and the Services and have had my questions answered to my satisfaction. . I give my informed consent for the services to be provided through the use of telemedicine in my medical care

## 2020-09-29 NOTE — ED Provider Notes (Signed)
[facility]    CSN: [id number] Arrival date & time: 09/29/20  1441      History   Chief Complaint Chief Complaint  Patient presents with  . Medication Refill    HPI Courtney Coleman is a 55 y.o. female presenting for medication refill: amlodipine, hydralazine, atorvastatin, eliquis, anastrozole, amiodarone.  Medical history stroke, anemia, hypertension, bradycardia, breast cancer, cerebral infarction, CHF, diabetes, paroxysmal A. fib, right hemiparesis.  States she has been on medications for about 1 year without issue.  Denies chest pain, dizziness, shortness of breath; she is feeling well today.  HPI  Past Medical History:  Diagnosis Date  . Acute blood loss anemia   . Alteration of sensations, post-stroke   . Anemia 03/28/2020  . Benign essential HTN   . Bradycardia   . Breast cancer ([place])   . Cerebral embolism with cerebral infarction 03/28/2020  . CHF (congestive heart failure) ([place])   . Controlled type 2 diabetes mellitus with hyperglycemia, without long-term current use of insulin ([place])   . CVA (cerebral vascular accident) ([place]) 03/29/2020  . Dysesthesia   . Dysrhythmia   . Essential hypertension   . Hypertension   . Malignant neoplasm of central portion of left breast in female, estrogen receptor positive ([place])   . PAF (paroxysmal atrial fibrillation) ([place]) 03/28/2020  . Port-A-Cath in place 05/14/2020  . Right foot drop 03/28/2020  . Right hemiparesis ([place])   . Slow transit constipation   . 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 Breast cancer ([place])   . Dysrhythmia   . Hypertension   . Port-A-Cath in place 05/14/2020  . Alteration of sensations, post-stroke   . Dysesthesia   . Malignant neoplasm of central portion of left breast in female, estrogen receptor positive ([place])   . Bradycardia   . Slow transit constipation   . Acute blood loss anemia   . Benign essential HTN   . Thalamic stroke ([place])  04/01/2020  . Controlled type 2 diabetes mellitus with hyperglycemia, without long-term current use of insulin ([place])   . Essential hypertension   . Right hemiparesis ([place])   . CVA (cerebral vascular accident) ([place]) 03/29/2020  . Right foot drop 03/28/2020  . CHF (congestive heart failure) ([place]) 03/28/2020  . Anemia 03/28/2020  . PAF (paroxysmal atrial fibrillation) ([place]) 03/28/2020  . Cerebral embolism with cerebral infarction 03/28/2020    Past Surgical History:  Procedure Laterality Date  . BREAST BIOPSY    . IR IMAGING GUIDED PORT INSERTION  05/06/2020    OB History   No obstetric history on file.      Home Medications    Prior to Admission medications   Medication Sig Start Date End Date Taking? Authorizing Provider  anastrozole (ARIMIDEX) 1 MG tablet Take 1 tablet (1 mg total) by mouth daily. 09/29/20  Yes Hazel Sams, PA-C  amiodarone (PACERONE) 200 MG tablet Take 1/2 tablet (100 mg total) by mouth daily. 09/29/20 10/29/20  Hazel Sams, PA-C  amLODipine (NORVASC) 5 MG tablet Take 1 and 1/2 tablets (7.5 mg total) by mouth daily. 09/29/20 10/29/20  Hazel Sams, PA-C  apixaban (ELIQUIS) 5 MG TABS tablet Take 1 tablet (5 mg total) by mouth 2 (two) times daily. 09/29/20 10/29/20  Hazel Sams, PA-C  atorvastatin (LIPITOR) 40 MG tablet Take 1 tablet (40 mg total) by mouth daily. 09/29/20 10/29/20  Hazel Sams, PA-C  dexamethasone (DECADRON) 4 MG tablet  Take 4 mg by mouth daily.    [provider]  hydrALAZINE (APRESOLINE) 10 MG tablet Take 1 tablet (10 mg total) by mouth every 8 (eight) hours. 09/29/20 10/29/20  Hazel Sams, PA-C  loperamide (IMODIUM) 2 MG capsule Take 1 capsule (2 mg total) by mouth as needed for diarrhea or loose stools. 05/21/20   Nicholas Lose, MD  ondansetron (ZOFRAN) 8 MG tablet Take 8 mg by mouth 2 (two) times daily as needed for nausea/vomiting.    [provider]  polyethylene glycol (MIRALAX / GLYCOLAX) 17 g packet Take 17 g by mouth 2  (two) times daily. 04/21/20   Love, Ivan Anchors, PA-C  prochlorperazine (COMPAZINE) 10 MG tablet Take 10 mg by mouth every 6 (six) hours as needed for nausea/vomiting.    [provider]  sacubitril-valsartan (ENTRESTO) 49-51 MG Take 1 tablet by mouth 2 (two) times daily. 09/29/20 10/29/20  Hazel Sams, PA-C    Family History Family History  Problem Relation Age of Onset  . Heart disease Mother   . Diabetes Father   . Cancer Neg Hx     Social History Social History   Tobacco Use  . Smoking status: Never Smoker  . Smokeless tobacco: Never Used  Substance Use Topics  . Alcohol use: Not Currently  . Drug use: Never     Allergies   Patient has no known allergies.   Review of Systems Review of Systems  All other systems reviewed and are negative.    Physical Exam Triage Vital Signs ED Triage Vitals  Enc Vitals Group     BP 09/29/20 1621 (!) 158/77     Pulse Rate 09/29/20 1621 68     Resp 09/29/20 1621 17     Temp 09/29/20 1621 98.3 F (36.8 C)     Temp Source 09/29/20 1621 Oral     SpO2 09/29/20 1621 100 %     Weight --      Height --      Head Circumference --      Peak Flow --      Pain Score 09/29/20 1620 0     Pain Loc --      Pain Edu? --      Excl. in [place]? --    No data found.  Updated Vital Signs BP (!) 158/77 (BP Location: Right Arm)   Pulse 68   Temp 98.3 F (36.8 C) (Oral)   Resp 17   SpO2 100%   Visual Acuity Right Eye Distance:   Left Eye Distance:   Bilateral Distance:    Right Eye Near:   Left Eye Near:    Bilateral Near:     Physical Exam Vitals reviewed.  Constitutional:      General: She is not in acute distress.    Appearance: Normal appearance. She is not ill-appearing or diaphoretic.  HENT:     Head: Normocephalic and atraumatic.  Cardiovascular:     Rate and Rhythm: Normal rate and regular rhythm.     Heart sounds: Normal heart sounds.  Pulmonary:     Effort: Pulmonary effort is normal.     Breath sounds:  Normal breath sounds.  Skin:    General: Skin is warm.  Neurological:     General: No focal deficit present.     Mental Status: She is alert and oriented to person, place, and time.  Psychiatric:        Mood and Affect: Mood normal.  Behavior: Behavior normal.        Thought Content: Thought content normal.        Judgment: Judgment normal.      UC Treatments / Results  Labs (all labs ordered are listed, but only abnormal results are displayed) Labs Reviewed - No data to display  EKG   Radiology No results found.  Procedures Procedures (including critical care time)  Medications Ordered in UC Medications - No data to display  Initial Impression / Assessment and Plan / UC Course  I have reviewed the triage vital signs and the nursing notes.  Pertinent labs & imaging results that were available during my care of the patient were reviewed by me and considered in my medical decision making (see chart for details).     This patient is a 55 year old female presenting for multiple medication refills.   For hypertension, refilled hydralazine, entresto, amlodipine.  For CVA, refillsed apixabin.  For breast cancer, refilled anastrozole For afib, refilled anastrozole  F/u with PCP for additional refills. ED return precautions discussed.  Coding this as a Level 4 as we discussed 4 stable chronic illnesses, and I prescribed multiple medications.  Final Clinical Impressions(s) / UC Diagnoses   Final diagnoses:  PAF (paroxysmal atrial fibrillation) (HCC)  Medication refill  History of CVA with residual deficit  Essential hypertension  Chronic congestive heart failure, unspecified heart failure type ([place])  Malignant neoplasm of female breast, unspecified estrogen receptor status, unspecified laterality, unspecified site of breast [facility])     Discharge Instructions     Medications refilled- all for 1 month with 2 refills.    ED Prescriptions    Medication Sig  Dispense Auth. Provider   anastrozole (ARIMIDEX) 1 MG tablet Take 1 tablet (1 mg total) by mouth daily. 30 tablet Hazel Sams, PA-C   amiodarone (PACERONE) 200 MG tablet Take 1/2 tablet (100 mg total) by mouth daily. 15 tablet Hazel Sams, PA-C   amLODipine (NORVASC) 5 MG tablet Take 1 and 1/2 tablets (7.5 mg total) by mouth daily. 45 tablet Hazel Sams, PA-C   apixaban (ELIQUIS) 5 MG TABS tablet Take 1 tablet (5 mg total) by mouth 2 (two) times daily. 60 tablet Hazel Sams, PA-C   atorvastatin (LIPITOR) 40 MG tablet Take 1 tablet (40 mg total) by mouth daily. 30 tablet Hazel Sams, PA-C   hydrALAZINE (APRESOLINE) 10 MG tablet Take 1 tablet (10 mg total) by mouth every 8 (eight) hours. 90 tablet Hazel Sams, PA-C   sacubitril-valsartan (ENTRESTO) 49-51 MG Take 1 tablet by mouth 2 (two) times daily. 60 tablet Hazel Sams, PA-C     PDMP not reviewed this encounter.   Hazel Sams, PA-C 09/29/20 1713

## 2020-09-29 NOTE — Telephone Encounter (Signed)
Pt's Husband called the [facility] office refill room. He is confused about an upcoming appt for her to have an EKG. Also, he said there was supposed to be a medication sent in to her pharmacy but the pharmacy didn't receive anything. Can you please call him or fwd this to the correct person??  Thanks!

## 2020-09-29 NOTE — ED Triage Notes (Signed)
Pt presents for RX refills on: Amlodipine Hydralazine Atorvastatin eliquis Anastrozole amiodarone

## 2020-09-30 ENCOUNTER — Telehealth: Payer: Self-pay | Admitting: Cardiology

## 2020-09-30 ENCOUNTER — Other Ambulatory Visit (HOSPITAL_COMMUNITY): Payer: Self-pay

## 2020-09-30 NOTE — Telephone Encounter (Signed)
Medication Samples have been provided to the patient.  Drug name: Eliquis        Strength: 5 mg       Qty: 56 tablet LOT: [id number]      Exp.Date: 03/2022  Dosing instructions: Take 1 tablet twice daily   Drug name: Delene Loll     Strength: 49 mg / 51 mg  Qty: 56    LOT: [id number]      Exp Date : 09/2022  Dosing instructions: Take 1 tablet once daily    Samples left for patient at [facility] office front desk  Toni Arthurs 11:02 AM 09/30/2020

## 2020-09-30 NOTE — Telephone Encounter (Signed)
Pt c/o medication issue: 1. Name of Medication: Eliquis and Entresto  2. How are you currently taking this medication (dosage and times per day)? Eliquis 5 gm 2 time and Entresto one tap twice a day 3. Are you having a reaction (difficulty breathing--STAT)?  No 4. What is your medication issue? Needing assistant with medication

## 2020-09-30 NOTE — Telephone Encounter (Signed)
Patient's husband called [facility] confused on medications , unsure how he received my direct line, I tried to assist patient but he had many questions, transferred patient to heartcare in the [place] ...made him aware someone would call him back in regards to his questions .     Call back   [phone]

## 2020-09-30 NOTE — Telephone Encounter (Signed)
Per Georgiann Mohs with [employer] office he spoke with patients husband and he is aware to pick up medication samples at [facility] office

## 2020-09-30 NOTE — Telephone Encounter (Signed)
Spoke to patient just now and she let me know that she has not filled out any patient assistance paperwork as of yet but would like to. I advised that she should come to the office to get these papers so that she can fill them out and we will send them off for her. She verbalizes understanding and states that she will do this.

## 2020-10-01 ENCOUNTER — Other Ambulatory Visit (HOSPITAL_COMMUNITY): Payer: Self-pay

## 2020-10-05 ENCOUNTER — Other Ambulatory Visit (INDEPENDENT_AMBULATORY_CARE_PROVIDER_SITE_OTHER): Payer: Self-pay

## 2020-10-05 DIAGNOSIS — I480 Paroxysmal atrial fibrillation: Secondary | ICD-10-CM

## 2020-10-05 NOTE — Progress Notes (Signed)
EKG

## 2020-10-06 ENCOUNTER — Encounter: Payer: Self-pay | Admitting: *Deleted

## 2020-10-06 ENCOUNTER — Telehealth: Payer: Self-pay | Admitting: Hematology and Oncology

## 2020-10-06 NOTE — Telephone Encounter (Signed)
Scheduled appt per 5/9 sch msg. Pt aware.  

## 2020-10-09 ENCOUNTER — Inpatient Hospital Stay: Payer: Self-pay

## 2020-10-09 ENCOUNTER — Other Ambulatory Visit: Payer: Self-pay | Admitting: Hematology and Oncology

## 2020-10-09 ENCOUNTER — Encounter: Payer: Self-pay | Admitting: Adult Health

## 2020-10-09 ENCOUNTER — Inpatient Hospital Stay (HOSPITAL_BASED_OUTPATIENT_CLINIC_OR_DEPARTMENT_OTHER): Payer: Self-pay | Admitting: Adult Health

## 2020-10-09 ENCOUNTER — Inpatient Hospital Stay: Payer: Self-pay | Attending: Hematology and Oncology

## 2020-10-09 ENCOUNTER — Encounter: Payer: Self-pay | Admitting: *Deleted

## 2020-10-09 ENCOUNTER — Other Ambulatory Visit: Payer: Self-pay

## 2020-10-09 VITALS — BP 161/71 | HR 77 | Temp 97.5°F | Resp 18 | Ht 65.0 in | Wt 226.4 lb

## 2020-10-09 DIAGNOSIS — C50112 Malignant neoplasm of central portion of left female breast: Secondary | ICD-10-CM

## 2020-10-09 DIAGNOSIS — Z170 Estrogen receptor positive status [ER+]: Secondary | ICD-10-CM

## 2020-10-09 DIAGNOSIS — Z95828 Presence of other vascular implants and grafts: Secondary | ICD-10-CM

## 2020-10-09 DIAGNOSIS — Z5112 Encounter for antineoplastic immunotherapy: Secondary | ICD-10-CM | POA: Insufficient documentation

## 2020-10-09 DIAGNOSIS — Z79899 Other long term (current) drug therapy: Secondary | ICD-10-CM | POA: Insufficient documentation

## 2020-10-09 LAB — CBC WITH DIFFERENTIAL (CANCER CENTER ONLY)
Abs Immature Granulocytes: 0.01 10*3/uL (ref 0.00–0.07)
Basophils Absolute: 0 10*3/uL (ref 0.0–0.1)
Basophils Relative: 1 %
Eosinophils Absolute: 0.2 10*3/uL (ref 0.0–0.5)
Eosinophils Relative: 3 %
HCT: 32.7 % — ABNORMAL LOW (ref 36.0–46.0)
Hemoglobin: 10.6 g/dL — ABNORMAL LOW (ref 12.0–15.0)
Immature Granulocytes: 0 %
Lymphocytes Relative: 31 %
Lymphs Abs: 2 10*3/uL (ref 0.7–4.0)
MCH: 32.1 pg (ref 26.0–34.0)
MCHC: 32.4 g/dL (ref 30.0–36.0)
MCV: 99.1 fL (ref 80.0–100.0)
Monocytes Absolute: 0.4 10*3/uL (ref 0.1–1.0)
Monocytes Relative: 6 %
Neutro Abs: 3.9 10*3/uL (ref 1.7–7.7)
Neutrophils Relative %: 59 %
Platelet Count: 198 10*3/uL (ref 150–400)
RBC: 3.3 MIL/uL — ABNORMAL LOW (ref 3.87–5.11)
RDW: 15 % (ref 11.5–15.5)
WBC Count: 6.6 10*3/uL (ref 4.0–10.5)
nRBC: 0 % (ref 0.0–0.2)

## 2020-10-09 LAB — CMP (CANCER CENTER ONLY)
ALT: 9 U/L (ref 0–44)
AST: 19 U/L (ref 15–41)
Albumin: 3.3 g/dL — ABNORMAL LOW (ref 3.5–5.0)
Alkaline Phosphatase: 84 U/L (ref 38–126)
Anion gap: 9 (ref 5–15)
BUN: 6 mg/dL (ref 6–20)
CO2: 27 mmol/L (ref 22–32)
Calcium: 8.9 mg/dL (ref 8.9–10.3)
Chloride: 104 mmol/L (ref 98–111)
Creatinine: 0.62 mg/dL (ref 0.44–1.00)
GFR, Estimated: >60 mL/min (ref >60)
Glucose, Bld: 110 mg/dL — ABNORMAL HIGH (ref 70–99)
Potassium: 3.4 mmol/L — ABNORMAL LOW (ref 3.5–5.1)
Sodium: 140 mmol/L (ref 135–145)
Total Bilirubin: 0.7 mg/dL (ref 0.3–1.2)
Total Protein: 7.4 g/dL (ref 6.5–8.1)

## 2020-10-09 MED ORDER — SODIUM CHLORIDE 0.9 % IV SOLN
Freq: Once | INTRAVENOUS | Status: AC
  Filled 2020-10-09: qty 250

## 2020-10-09 MED ORDER — HEPARIN SOD (PORK) LOCK FLUSH 100 UNIT/ML IV SOLN
500.0000 [IU] | Freq: Once | INTRAVENOUS | Status: AC | PRN
  Administered 2020-10-09: 500 [IU]
  Filled 2020-10-09: qty 5

## 2020-10-09 MED ORDER — ACETAMINOPHEN 325 MG PO TABS
ORAL_TABLET | ORAL | Status: AC
  Filled 2020-10-09: qty 2

## 2020-10-09 MED ORDER — DIPHENHYDRAMINE HCL 25 MG PO CAPS
50.0000 mg | ORAL_CAPSULE | Freq: Once | ORAL | Status: AC
  Administered 2020-10-09: 50 mg via ORAL

## 2020-10-09 MED ORDER — DIPHENHYDRAMINE HCL 25 MG PO CAPS
ORAL_CAPSULE | ORAL | Status: AC
  Filled 2020-10-09: qty 2

## 2020-10-09 MED ORDER — ACETAMINOPHEN 325 MG PO TABS
650.0000 mg | ORAL_TABLET | Freq: Once | ORAL | Status: AC
  Administered 2020-10-09: 650 mg via ORAL

## 2020-10-09 MED ORDER — TRASTUZUMAB CHEMO 150 MG IV SOLR
600.0000 mg | Freq: Once | INTRAVENOUS | Status: AC
  Administered 2020-10-09: 600 mg via INTRAVENOUS
  Filled 2020-10-09: qty 28.57

## 2020-10-09 MED ORDER — SODIUM CHLORIDE 0.9% FLUSH
10.0000 mL | Freq: Once | INTRAVENOUS | Status: AC
  Administered 2020-10-09: 10 mL
  Filled 2020-10-09: qty 10

## 2020-10-09 MED ORDER — SODIUM CHLORIDE 0.9 % IV SOLN
420.0000 mg | Freq: Once | INTRAVENOUS | Status: AC
  Administered 2020-10-09: 420 mg via INTRAVENOUS
  Filled 2020-10-09: qty 14

## 2020-10-09 MED ORDER — SODIUM CHLORIDE 0.9% FLUSH
10.0000 mL | INTRAVENOUS | Status: DC | PRN
  Administered 2020-10-09: 10 mL
  Filled 2020-10-09: qty 10

## 2020-10-09 NOTE — Progress Notes (Signed)
[facility] Follow up:    Kerin Perna, NP [place] [address]   DIAGNOSIS: Cancer Staging No matching staging information was found for the patient.  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   05/15/2020 - 08/27/2020 Neo-Adjuvant Chemotherapy   TCHP neoadjuvantly x 6   09/17/2020 -  Chemotherapy      Patient is on Antibody Plan: BREAST TRASTUZUMAB + PERTUZUMAB Q21D      CURRENT THERAPY: Herceptin/Perjeta  INTERVAL HISTORY: Verenise Moulin 55 y.o. female returns for evaluation prior to receiving Herceptin/Perjeta.  She completed neoadjuvant chemotherapy with TCHP on 08/27/2020  Her follow up MRI showed a reduction in her tumor by 3cm.  She has undergone pre operative evaluation with Dr. Geraldo Pitter and he is awaiting results from [place] before he makes final decision.    Her most recent echo was completed on 08/06/2020.  It was normal with EF of 60-65%.   Her major complaint is that her nails have started coming off.  She is concerned about this.  She has some numbness in her right fingertips but that is from a previous stroke.  She denies numbness from chemotherapy.      Patient Active Problem List   Diagnosis Date Noted  . Breast cancer ([place])   . Dysrhythmia   . Hypertension   . Port-A-Cath in place 05/14/2020  . Alteration of sensations, post-stroke   . Dysesthesia   . Malignant neoplasm of central portion of left breast in  female, estrogen receptor positive ([place])   . Bradycardia   . Slow transit constipation   . Acute blood loss anemia   . Benign essential HTN   . 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 Controlled type 2 diabetes mellitus with hyperglycemia, without long-term current use of insulin ([place])   . Essential hypertension   . Right hemiparesis ([place])   . CVA (cerebral vascular accident) ([place]) 03/29/2020  . Right foot drop 03/28/2020  . CHF (congestive heart failure) ([place]) 03/28/2020  . Anemia 03/28/2020  . PAF (paroxysmal atrial fibrillation) ([place]) 03/28/2020  . Cerebral embolism with cerebral infarction 03/28/2020    has No Known Allergies.  MEDICAL HISTORY: Past Medical History:  Diagnosis Date  . Acute blood loss anemia   . Alteration of sensations, post-stroke   . Anemia 03/28/2020  . Benign essential HTN   . Bradycardia   . Breast cancer ([place])   . Cerebral embolism with cerebral infarction 03/28/2020  . CHF (congestive heart failure) ([place])   . Controlled type 2 diabetes mellitus with hyperglycemia, without long-term current use of insulin ([place])   . CVA (cerebral vascular accident) ([place]) 03/29/2020  . Dysesthesia   . Dysrhythmia   . Essential hypertension   . Hypertension   . Malignant neoplasm of central portion of left breast in female, estrogen receptor positive ([place])   . PAF (paroxysmal atrial fibrillation) ([place]) 03/28/2020  . Port-A-Cath in place 05/14/2020  . Right foot drop 03/28/2020  . Right hemiparesis ([place])   .  Slow transit constipation   . 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 BREAST BIOPSY    . IR IMAGING GUIDED PORT INSERTION  05/06/2020    SOCIAL HISTORY: Social History   Socioeconomic History  . Marital status: Single    Spouse name: Not on file  . Number of children: Not on file  . Years of education: Not on file  . Highest education level: Not on file  Occupational History  . Not on  file  Tobacco Use  . Smoking status: Never Smoker  . Smokeless tobacco: Never Used  Substance and Sexual Activity  . Alcohol use: Not Currently  . Drug use: Never  . Sexual activity: Not on file  Other Topics Concern  . Not on file  Social History Narrative  . Not on file   Social Determinants of Health   Financial Resource Strain: Not on file  Food Insecurity: Not on file  Transportation Needs: Not on file  Physical Activity: Not on file  Stress: Not on file  Social Connections: Not on file  Intimate Partner Violence: Not on file    FAMILY HISTORY: Family History  Problem Relation Age of Onset  . Heart disease Mother   . Diabetes Father   . Cancer Neg Hx     Review of Systems  Constitutional: Negative for appetite change, chills, fatigue, fever and unexpected weight change.  HENT:   Negative for hearing loss, lump/mass and trouble swallowing.   Eyes: Negative for eye problems and icterus.  Respiratory: Negative for chest tightness, cough and shortness of breath.   Cardiovascular: Negative for chest pain, leg swelling and palpitations.  Gastrointestinal: Negative for abdominal distention, abdominal pain, constipation, diarrhea, nausea and vomiting.  Endocrine: Negative for hot flashes.  Genitourinary: Negative for difficulty urinating.   Musculoskeletal: Negative for arthralgias.  Skin: Negative for itching and rash.  Neurological: Negative for dizziness, extremity weakness, headaches and numbness.  Hematological: Negative for adenopathy. Does not bruise/bleed easily.  Psychiatric/Behavioral: Negative for depression. The patient is not nervous/anxious.       PHYSICAL EXAMINATION  ECOG PERFORMANCE STATUS: 1 - Symptomatic but completely ambulatory  Vitals:   10/09/20 0834  BP: (!) 161/71  Pulse: 77  Resp: 18  Temp: (!) 97.5 F (36.4 C)  SpO2: 100%    Physical Exam Constitutional:      General: She is not in acute distress.    Appearance: Normal  appearance. She is not toxic-appearing.  HENT:     Head: Normocephalic and atraumatic.  Eyes:     General: No scleral icterus. Cardiovascular:     Rate and Rhythm: Normal rate and regular rhythm.     Pulses: Normal pulses.     Heart sounds: Normal heart sounds.  Pulmonary:     Effort: Pulmonary effort is normal.     Breath sounds: Normal breath sounds.  Abdominal:     General: Abdomen is flat. Bowel sounds are normal. There is no distension.     Palpations: Abdomen is soft.     Tenderness: There is no abdominal tenderness.  Musculoskeletal:        General: No swelling.     Cervical back: Neck supple.  Lymphadenopathy:     Cervical: No cervical adenopathy.  Skin:    General: Skin is warm and dry.     Findings: No rash.     Comments: + nail dyscrasia   Neurological:  General: No focal deficit present.     Mental Status: She is alert.  Psychiatric:        Mood and Affect: Mood normal.        Behavior: Behavior normal.     LABORATORY DATA:  CBC    Component Value Date/Time   WBC 6.6 10/09/2020 0813   WBC 8.2 05/06/2020 1315   RBC 3.30 (L) 10/09/2020 0813   HGB 10.6 (L) 10/09/2020 0813   HCT 32.7 (L) 10/09/2020 0813   PLT 198 10/09/2020 0813   MCV 99.1 10/09/2020 0813   MCH 32.1 10/09/2020 0813   MCHC 32.4 10/09/2020 0813   RDW 15.0 10/09/2020 0813   LYMPHSABS 2.0 10/09/2020 0813   MONOABS 0.4 10/09/2020 0813   EOSABS 0.2 10/09/2020 0813   BASOSABS 0.0 10/09/2020 0813    CMP     Component Value Date/Time   NA 140 10/09/2020 0813   K 3.4 (L) 10/09/2020 0813   CL 104 10/09/2020 0813   CO2 27 10/09/2020 0813   GLUCOSE 110 (H) 10/09/2020 0813   BUN 6 10/09/2020 0813   CREATININE 0.62 10/09/2020 0813   CALCIUM 8.9 10/09/2020 0813   PROT 7.4 10/09/2020 0813   ALBUMIN 3.3 (L) 10/09/2020 0813   AST 19 10/09/2020 0813   ALT 9 10/09/2020 0813   ALKPHOS 84 10/09/2020 0813   BILITOT 0.7 10/09/2020 0813   GFRNONAA >60 10/09/2020 0813          ASSESSMENT and THERAPY PLAN:   Malignant neoplasm of central portion of left breast in female, estrogen receptor positive ([place]) Palpable left breast mass started February 2021 (patient lives in the [place]), went to [place] [facility] and mammogram and ultrasound 01/03/2020: 8 cm distortion and calcifications left breast 2:00 and 3 abnormal axillary lymph nodes. 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  Treatment Plan:: 1. Neoadjuvant chemotherapy with [place] Perjeta 6 cycles followed by HerceptinPerjeta versus Kadcylamaintenance for 1 year 2. Followed bymastectomy with the targeted node dissection 3. Followed by adjuvant radiation therapy 4.Followed by antiestrogen therapy and neratinib -------------------------------------------------------------------------------------------------------------------------------- Current Treatment: Hercpetin/Perjeta ECHO 08/06/2020: EF 60-65%  Chemo toxicities: 1. 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 2. Fatigue 3. Alopecia 4. Chemo induced anemia: Hemoglobin is 9.9. Monitoring 5.Nail dyscrasia  I reviewed her MRI results with her in detail, and I reviewed her next steps.  We are awaiting cardiology work up and in the meantime, she will receive Herceptin/Perjeta.  She is due for echo soon, so we will work to go ahead and get that scheduled.    She will return in 3 weeks for Herceptin/Perjeta and in 6 weeks for labs, f/u with Dr. Lindi Adie, Herceptin/Perjeta.  I have updated her clinicians in [place] so that we can communicate with one another.     Orders Placed This Encounter  Procedures  . CBC with Differential (Cancer Center Only)    Standing Status:   Standing    Number of Occurrences:   52    Standing Expiration Date:   10/09/2021  . CMP ([facility] only)    Standing Status:   Standing    Number of Occurrences:   52    Standing Expiration Date:   10/09/2021  .  ECHOCARDIOGRAM COMPLETE    Standing Status:   Future    Standing Expiration Date:   10/09/2021    Order Specific Question:   Where should this test be performed    Answer:   [facility]    Order Specific Question:   Perflutren DEFINITY (image enhancing agent) should be administered unless hypersensitivity or allergy exist    Answer:   Administer Perflutren    Order Specific Question:   Reason for exam-Echo    Answer:   Chemo  Z09    All questions were answered. The patient knows to call the clinic with any problems, questions or concerns. We can certainly see the patient much sooner if necessary.  Total encounter time: 55 minutes* in placing orders, reviewing documentation, adding MDs to care team for collaboration and communication, discussion on patient status with nurse navigators, face to face visit time, placing orders, and documentation.   Wilber Bihari, NP 10/09/20 9:06 AM Medical Oncology and Hematology [facility] [place], [place] [address] Tel. [phone]    Fax. [phone]  *Total Encounter Time as defined by the Centers for Medicare and Medicaid Services includes, in addition to the face-to-face time of a patient visit (documented in the note above) non-face-to-face time: obtaining and reviewing outside history, ordering and reviewing medications, tests or procedures, care coordination (communications with other health care professionals or caregivers) and documentation in the medical record.

## 2020-10-09 NOTE — Assessment & Plan Note (Addendum)
Palpable left breast mass started February 2021 (patient lives in the [place]), went to [facility] and mammogram and ultrasound 01/03/2020: 8 cm distortion and calcifications left breast 2:00 and 3 abnormal axillary lymph nodes. 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  Treatment Plan:: 1. Neoadjuvant chemotherapy with [place] Perjeta 6 cycles followed by HerceptinPerjeta versus Kadcylamaintenance for 1 year 2. Followed bymastectomy with the targeted node dissection 3. Followed by adjuvant radiation therapy 4.Followed by antiestrogen therapy and neratinib -------------------------------------------------------------------------------------------------------------------------------- Current Treatment: Hercpetin/Perjeta ECHO 08/06/2020: EF 60-65%  Chemo toxicities: 1. 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 2. Fatigue 3. Alopecia 4. Chemo induced anemia: Hemoglobin is 9.9. Monitoring 5.Nail dyscrasia  I reviewed her MRI results with her in detail, and I reviewed her next steps.  We are awaiting cardiology work up and in the meantime, she will receive Herceptin/Perjeta.  She is due for echo soon, so we will work to go ahead and get that scheduled.    She will return in 3 weeks for Herceptin/Perjeta and in 6 weeks for labs, f/u with Dr. Lindi Adie, Herceptin/Perjeta.  I have updated her clinicians in [place] so that we can communicate with one another.

## 2020-10-14 ENCOUNTER — Encounter: Payer: Self-pay | Admitting: *Deleted

## 2020-10-15 ENCOUNTER — Telehealth: Payer: Self-pay | Admitting: Hematology and Oncology

## 2020-10-15 NOTE — Telephone Encounter (Signed)
Scheduled appointment per 05/13 los. Patient is aware. 

## 2020-10-19 ENCOUNTER — Encounter: Payer: Self-pay | Admitting: *Deleted

## 2020-10-20 ENCOUNTER — Telehealth: Payer: Self-pay | Admitting: Cardiology

## 2020-10-20 NOTE — Telephone Encounter (Signed)
    Carver Fila is calling, he said the pt asked him to call for her, he said pt's doctor wants a copy of pt's EKG. He only gave the doctor's office number [phone] for Dr. Autumn Messing.

## 2020-10-21 NOTE — Telephone Encounter (Signed)
Follow up:     Abigail Butts from [facility] calling to speak with some one concering this patient. [phone].

## 2020-10-21 NOTE — Telephone Encounter (Signed)
[facility] that we had not received a clearance form for surgery. They will fax form to be completed.

## 2020-10-22 ENCOUNTER — Encounter: Payer: Self-pay | Admitting: *Deleted

## 2020-10-22 ENCOUNTER — Telehealth: Payer: Self-pay

## 2020-10-22 NOTE — Telephone Encounter (Signed)
   Patient Name: Courtney Coleman  DOB: 05-03-1966  MRN: [id number]   Primary Cardiologist: None  Chart reviewed as part of pre-operative protocol coverage. Patient was recently seen virtually by Dr. Geraldo Pitter, with recommendation to review outside cardiology records prior to providing final input regarding cardiac risk stratification. Will route pre-op request to Dr. Geraldo Pitter.   Dr. Geraldo Pitter,  Please notify the pre-op pool (p cv div preop) with final recommendations once outside records have been received and reviewed.    Christell Faith, PA-C 10/22/2020, 10:52 AM

## 2020-10-22 NOTE — Telephone Encounter (Signed)
   [facility] Pre-operative Risk Assessment    Patient Name: Courtney Coleman  DOB: [birth year]  MRN: [id number]   HEARTCARE STAFF: - Please ensure there is not already an duplicate clearance open for this procedure. - Under Visit Info/Reason for Call, type in Other and utilize the format Clearance MM/DD/YY or Clearance TBD. Do not use dashes or single digits. - If request is for dental extraction, please clarify the # of teeth to be extracted.  Request for surgical clearance:  1. What type of surgery is being performed? Left breast lumpectomy   2. When is this surgery scheduled? TBD   3. What type of clearance is required (medical clearance vs. Pharmacy clearance to hold med vs. Both)? Medical  4. Are there any medications that need to be held prior to surgery and how long?   5. Practice name and name of physician performing surgery? [facility] Dr. Autumn Messing   6. What is the office phone number? [phone]   7.   What is the office fax number? [phone] Attention Carlene Coria  8.   Anesthesia type (None, local, MAC, general) ? General   Lowella Grip 10/22/2020, 9:28 AM  _________________________________________________________________   (provider comments below)

## 2020-10-28 ENCOUNTER — Ambulatory Visit: Payer: Self-pay | Admitting: General Surgery

## 2020-10-28 DIAGNOSIS — C50112 Malignant neoplasm of central portion of left female breast: Secondary | ICD-10-CM

## 2020-10-28 DIAGNOSIS — Z170 Estrogen receptor positive status [ER+]: Secondary | ICD-10-CM

## 2020-10-28 NOTE — Telephone Encounter (Signed)
    [name] DOB:  06/22/1965  MRN:  [id number]   Primary Cardiologist: None  Chart reviewed as part of pre-operative protocol coverage. Given past medical history and time since last visit, based on ACC/AHA guidelines, Courtney Coleman would be at acceptable risk for the planned procedure without further cardiovascular testing.   Per addendum to most recent virtual visit with Dr. Geraldo Pitter " Addendum: I reviewed records from the other hospital and cardiologist's office.  Patient had paroxysmal atrial fibrillation and underwent TEE guided cardioversion.  Also coronary angiography was normal.  In view of these findings and the fact that she has good effort tolerance is not at high risk for coronary events during the aforementioned procedure.  Medical hemodynamic monitoring will further reduce the risk of coronary events.  Please do not hesitate to call us with any questions in a cardiovascular management. Signed Dr. Sunny Schlein Revankar 10/27/2020."  The patient was advised that if she develops new symptoms prior to surgery to contact our office to arrange for a follow-up visit, and she verbalized understanding.  I will route this recommendation to the requesting party via Epic fax function and remove from pre-op pool.  Please call with questions.  Loel Dubonnet, NP 10/28/2020, 9:57 AM

## 2020-10-29 ENCOUNTER — Encounter: Payer: Self-pay | Admitting: Hematology and Oncology

## 2020-10-29 ENCOUNTER — Other Ambulatory Visit: Payer: Self-pay | Admitting: *Deleted

## 2020-10-29 ENCOUNTER — Other Ambulatory Visit (HOSPITAL_COMMUNITY): Payer: Self-pay

## 2020-10-29 ENCOUNTER — Other Ambulatory Visit: Payer: Self-pay | Admitting: General Surgery

## 2020-10-29 ENCOUNTER — Telehealth: Payer: Self-pay | Admitting: *Deleted

## 2020-10-29 ENCOUNTER — Encounter: Payer: Self-pay | Admitting: *Deleted

## 2020-10-29 DIAGNOSIS — Z170 Estrogen receptor positive status [ER+]: Secondary | ICD-10-CM

## 2020-10-29 MED ORDER — ATORVASTATIN CALCIUM 40 MG PO TABS
40.0000 mg | ORAL_TABLET | Freq: Every day | ORAL | 2 refills | Status: DC
  Filled 2020-10-29: qty 30, 30d supply, fill #0
  Filled 2020-11-25: qty 30, 30d supply, fill #1
  Filled 2020-12-25: qty 30, 30d supply, fill #2

## 2020-10-29 MED ORDER — LOPERAMIDE HCL 2 MG PO CAPS
2.0000 mg | ORAL_CAPSULE | ORAL | 1 refills | Status: DC | PRN
  Filled 2020-10-29: qty 30, 6d supply, fill #0

## 2020-10-29 MED ORDER — APIXABAN 5 MG PO TABS
5.0000 mg | ORAL_TABLET | Freq: Two times a day (BID) | ORAL | 2 refills | Status: DC
  Filled 2020-10-29: qty 60, 30d supply, fill #0

## 2020-10-29 MED ORDER — SACUBITRIL-VALSARTAN 49-51 MG PO TABS
1.0000 | ORAL_TABLET | Freq: Two times a day (BID) | ORAL | 2 refills | Status: DC
  Filled 2020-10-29: qty 60, 30d supply, fill #0

## 2020-10-29 MED ORDER — HYDRALAZINE HCL 10 MG PO TABS
10.0000 mg | ORAL_TABLET | Freq: Three times a day (TID) | ORAL | 2 refills | Status: DC
  Filled 2020-10-29: qty 90, 30d supply, fill #0
  Filled 2020-11-25: qty 90, 30d supply, fill #1
  Filled 2020-12-25: qty 90, 30d supply, fill #2

## 2020-10-29 MED ORDER — POLYETHYLENE GLYCOL 3350 17 G PO PACK
17.0000 g | PACK | Freq: Two times a day (BID) | ORAL | 0 refills | Status: DC
  Filled 2020-10-29: qty 60, 30d supply, fill #0

## 2020-10-29 MED ORDER — AMLODIPINE BESYLATE 5 MG PO TABS
7.5000 mg | ORAL_TABLET | Freq: Every day | ORAL | 2 refills | Status: DC
  Filled 2020-10-29: qty 45, 30d supply, fill #0
  Filled 2020-11-25: qty 45, 30d supply, fill #1
  Filled 2020-12-25: qty 45, 30d supply, fill #2

## 2020-10-29 MED ORDER — AMIODARONE HCL 200 MG PO TABS
100.0000 mg | ORAL_TABLET | Freq: Every day | ORAL | 2 refills | Status: DC
  Filled 2020-10-29: qty 15, 30d supply, fill #0
  Filled 2020-11-25: qty 4, 8d supply, fill #1
  Filled 2020-11-25: qty 11, 22d supply, fill #1
  Filled 2020-12-25: qty 15, 30d supply, fill #2

## 2020-10-29 MED ORDER — ONDANSETRON HCL 8 MG PO TABS
8.0000 mg | ORAL_TABLET | Freq: Two times a day (BID) | ORAL | 0 refills | Status: DC | PRN
  Filled 2020-10-29: qty 20, 10d supply, fill #0

## 2020-10-29 MED ORDER — ANASTROZOLE 1 MG PO TABS
1.0000 mg | ORAL_TABLET | Freq: Every day | ORAL | 2 refills | Status: DC
  Filled 2020-10-29: qty 30, 30d supply, fill #0
  Filled 2020-11-25: qty 30, 30d supply, fill #1
  Filled 2020-12-25: qty 30, 30d supply, fill #2

## 2020-10-29 MED ORDER — DEXAMETHASONE 4 MG PO TABS
4.0000 mg | ORAL_TABLET | Freq: Every day | ORAL | 0 refills | Status: DC
  Filled 2020-10-29: qty 30, 30d supply, fill #0

## 2020-10-29 MED ORDER — PROCHLORPERAZINE MALEATE 10 MG PO TABS
10.0000 mg | ORAL_TABLET | Freq: Four times a day (QID) | ORAL | 0 refills | Status: DC | PRN
  Filled 2020-10-29: qty 30, 8d supply, fill #0

## 2020-10-29 NOTE — Telephone Encounter (Signed)
Received call from pt requesting refill on all medication listed on file.  Pt states she is out of all of her heart medication and has not been successful at scheduling an appointment at the [facility] to establish care with a PCP.  RN reached out to social work to see if they can be in assistance with scheduling pt.  If social work is unable to provide assistance, RN will call the [facility] and schedule an apt for pt to be seen and establish PCP care.

## 2020-10-30 ENCOUNTER — Inpatient Hospital Stay: Payer: Self-pay | Attending: Hematology and Oncology

## 2020-10-30 ENCOUNTER — Telehealth: Payer: Self-pay | Admitting: Cardiology

## 2020-10-30 ENCOUNTER — Other Ambulatory Visit: Payer: Self-pay | Admitting: Hematology and Oncology

## 2020-10-30 ENCOUNTER — Other Ambulatory Visit (HOSPITAL_COMMUNITY): Payer: Self-pay

## 2020-10-30 ENCOUNTER — Other Ambulatory Visit: Payer: Self-pay

## 2020-10-30 VITALS — BP 158/82 | HR 64 | Temp 98.2°F | Resp 18

## 2020-10-30 DIAGNOSIS — Z170 Estrogen receptor positive status [ER+]: Secondary | ICD-10-CM

## 2020-10-30 DIAGNOSIS — Z5112 Encounter for antineoplastic immunotherapy: Secondary | ICD-10-CM | POA: Insufficient documentation

## 2020-10-30 DIAGNOSIS — Z79899 Other long term (current) drug therapy: Secondary | ICD-10-CM | POA: Insufficient documentation

## 2020-10-30 DIAGNOSIS — C50112 Malignant neoplasm of central portion of left female breast: Secondary | ICD-10-CM | POA: Insufficient documentation

## 2020-10-30 MED ORDER — SACUBITRIL-VALSARTAN 49-51 MG PO TABS: 1.0000 | ORAL_TABLET | Freq: Two times a day (BID) | ORAL | 2 refills | Status: DC

## 2020-10-30 MED ORDER — HEPARIN SOD (PORK) LOCK FLUSH 100 UNIT/ML IV SOLN
500.0000 [IU] | Freq: Once | INTRAVENOUS | Status: AC | PRN
  Administered 2020-10-30: 500 [IU]
  Filled 2020-10-30: qty 5

## 2020-10-30 MED ORDER — DIPHENHYDRAMINE HCL 25 MG PO CAPS
50.0000 mg | ORAL_CAPSULE | Freq: Once | ORAL | Status: AC
  Administered 2020-10-30: 50 mg via ORAL

## 2020-10-30 MED ORDER — ACETAMINOPHEN 325 MG PO TABS
ORAL_TABLET | ORAL | Status: AC
  Filled 2020-10-30: qty 2

## 2020-10-30 MED ORDER — APIXABAN 5 MG PO TABS: 5.0000 mg | ORAL_TABLET | Freq: Two times a day (BID) | ORAL | 2 refills | Status: DC

## 2020-10-30 MED ORDER — SODIUM CHLORIDE 0.9 % IV SOLN
420.0000 mg | Freq: Once | INTRAVENOUS | Status: AC
  Administered 2020-10-30: 420 mg via INTRAVENOUS
  Filled 2020-10-30: qty 14

## 2020-10-30 MED ORDER — SODIUM CHLORIDE 0.9 % IV SOLN
Freq: Once | INTRAVENOUS | Status: AC
  Filled 2020-10-30: qty 250

## 2020-10-30 MED ORDER — TRASTUZUMAB CHEMO 150 MG IV SOLR
600.0000 mg | Freq: Once | INTRAVENOUS | Status: AC
  Administered 2020-10-30: 600 mg via INTRAVENOUS
  Filled 2020-10-30: qty 28.57

## 2020-10-30 MED ORDER — SODIUM CHLORIDE 0.9% FLUSH
10.0000 mL | INTRAVENOUS | Status: DC | PRN
  Administered 2020-10-30: 10 mL
  Filled 2020-10-30: qty 10

## 2020-10-30 MED ORDER — DIPHENHYDRAMINE HCL 25 MG PO CAPS
ORAL_CAPSULE | ORAL | Status: AC
  Filled 2020-10-30: qty 2

## 2020-10-30 MED ORDER — ACETAMINOPHEN 325 MG PO TABS
650.0000 mg | ORAL_TABLET | Freq: Once | ORAL | Status: AC
  Administered 2020-10-30: 650 mg via ORAL

## 2020-10-30 NOTE — Telephone Encounter (Signed)
Spoke to [facility] staff who was requesting samples for pt. Called and left a message that samples are at the [facility] office. Pt assistance forms are given as well as a message in her MyChart with the forms.

## 2020-10-30 NOTE — Telephone Encounter (Signed)
I called but was unable to speak to the patient husband(not on DPR), asked to call back when the patient finish her treatment.

## 2020-10-30 NOTE — Telephone Encounter (Signed)
Pt c/o medication issue: 1. Name of Medication: Eliquis and Entresto  2. How are you currently taking this medication (dosage and times per day)? Entresto once a day and Elquis once a day 3. Are you having a reaction (difficulty breathing--STAT)?  No  4. What is your medication issue?  Medication Assistant

## 2020-10-30 NOTE — Patient Instructions (Signed)
[facility]  Discharge Instructions: Thank you for choosing [facility] to provide your oncology and hematology care.   If you have a lab appointment with the [facility], please go directly to the [facility] and check in at the registration area.   Wear comfortable clothing and clothing appropriate for easy access to any Portacath or PICC line.   We strive to give you quality time with your provider. You may need to reschedule your appointment if you arrive late (15 or more minutes).  Arriving late affects you and other patients whose appointments are after yours.  Also, if you miss three or more appointments without notifying the office, you may be dismissed from the clinic at the provider's discretion.      For prescription refill requests, have your pharmacy contact our office and allow 72 hours for refills to be completed.    Today you received the following chemotherapy and/or immunotherapy agents Herceptin and Perjeta   To help prevent nausea and vomiting after your treatment, we encourage you to take your nausea medication as directed.  BELOW ARE SYMPTOMS THAT SHOULD BE REPORTED IMMEDIATELY: . *FEVER GREATER THAN 100.4 F (38 C) OR HIGHER . *CHILLS OR SWEATING . *NAUSEA AND VOMITING THAT IS NOT CONTROLLED WITH YOUR NAUSEA MEDICATION . *UNUSUAL SHORTNESS OF BREATH . *UNUSUAL BRUISING OR BLEEDING . *URINARY PROBLEMS (pain or burning when urinating, or frequent urination) . *BOWEL PROBLEMS (unusual diarrhea, constipation, pain near the anus) . TENDERNESS IN MOUTH AND THROAT WITH OR WITHOUT PRESENCE OF ULCERS (sore throat, sores in mouth, or a toothache) . UNUSUAL RASH, SWELLING OR PAIN  . UNUSUAL VAGINAL DISCHARGE OR ITCHING   Items with * indicate a potential emergency and should be followed up as soon as possible or go to the Emergency Department if any problems should occur.  Please show the CHEMOTHERAPY ALERT CARD or  IMMUNOTHERAPY ALERT CARD at check-in to the Emergency Department and triage nurse.  Should you have questions after your visit or need to cancel or reschedule your appointment, please contact [facility]  Dept: [phone]  and follow the prompts.  Office hours are 8:00 a.m. to 4:30 p.m. Monday - Friday. Please note that voicemails left after 4:00 p.m. may not be returned until the following business day.  We are closed weekends and major holidays. You have access to a nurse at all times for urgent questions. Please call the main number to the clinic Dept: [phone] and follow the prompts.   For any non-urgent questions, you may also contact your provider using MyChart. We now offer e-Visits for anyone 55 and older to request care online for non-urgent symptoms. For details visit [email or web address].   Also download the MyChart app! Go to the app store, search "MyChart", open the app, select [facility], and log in with your MyChart username and password.  Due to Covid, a mask is required upon entering the hospital/clinic. If you do not have a mask, one will be given to you upon arrival. For doctor visits, patients may have 1 support person aged 55 or older with them. For treatment visits, patients cannot have anyone with them due to current Covid guidelines and our immunocompromised population.

## 2020-11-04 ENCOUNTER — Other Ambulatory Visit: Payer: Self-pay

## 2020-11-04 ENCOUNTER — Ambulatory Visit (HOSPITAL_COMMUNITY)
Admission: RE | Admit: 2020-11-04 | Discharge: 2020-11-04 | Disposition: A | Discharge status: Home / Self Care | Payer: Self-pay | Source: Ambulatory Visit | Attending: Adult Health | Admitting: Adult Health

## 2020-11-04 DIAGNOSIS — I898 Other specified noninfective disorders of lymphatic vessels and lymph nodes: Secondary | ICD-10-CM | POA: Insufficient documentation

## 2020-11-04 DIAGNOSIS — Z8673 Personal history of transient ischemic attack (TIA), and cerebral infarction without residual deficits: Secondary | ICD-10-CM | POA: Insufficient documentation

## 2020-11-04 DIAGNOSIS — Z7952 Long term (current) use of systemic steroids: Secondary | ICD-10-CM | POA: Insufficient documentation

## 2020-11-04 DIAGNOSIS — C50112 Malignant neoplasm of central portion of left female breast: Secondary | ICD-10-CM | POA: Insufficient documentation

## 2020-11-04 DIAGNOSIS — Z9221 Personal history of antineoplastic chemotherapy: Secondary | ICD-10-CM | POA: Insufficient documentation

## 2020-11-04 DIAGNOSIS — Z01818 Encounter for other preprocedural examination: Secondary | ICD-10-CM | POA: Insufficient documentation

## 2020-11-04 DIAGNOSIS — I358 Other nonrheumatic aortic valve disorders: Secondary | ICD-10-CM | POA: Insufficient documentation

## 2020-11-04 DIAGNOSIS — Z79811 Long term (current) use of aromatase inhibitors: Secondary | ICD-10-CM | POA: Insufficient documentation

## 2020-11-04 DIAGNOSIS — N6032 Fibrosclerosis of left breast: Secondary | ICD-10-CM | POA: Insufficient documentation

## 2020-11-04 DIAGNOSIS — Z0189 Encounter for other specified special examinations: Secondary | ICD-10-CM

## 2020-11-04 DIAGNOSIS — Z79899 Other long term (current) drug therapy: Secondary | ICD-10-CM | POA: Insufficient documentation

## 2020-11-04 DIAGNOSIS — Z7901 Long term (current) use of anticoagulants: Secondary | ICD-10-CM | POA: Insufficient documentation

## 2020-11-04 DIAGNOSIS — C50412 Malignant neoplasm of upper-outer quadrant of left female breast: Secondary | ICD-10-CM | POA: Insufficient documentation

## 2020-11-04 DIAGNOSIS — I10 Essential (primary) hypertension: Secondary | ICD-10-CM | POA: Insufficient documentation

## 2020-11-04 DIAGNOSIS — Z170 Estrogen receptor positive status [ER+]: Secondary | ICD-10-CM | POA: Insufficient documentation

## 2020-11-04 DIAGNOSIS — I340 Nonrheumatic mitral (valve) insufficiency: Secondary | ICD-10-CM | POA: Insufficient documentation

## 2020-11-04 LAB — ECHOCARDIOGRAM COMPLETE
AR max vel: 1.97 cm2
AV Area VTI: 2.05 cm2
AV Area mean vel: 1.95 cm2
AV Mean grad: 6 mmHg
AV Peak grad: 11 mmHg
Ao pk vel: 1.66 m/s
Area-P 1/2: 3.21 cm2
S' Lateral: 3.3 cm

## 2020-11-18 NOTE — Assessment & Plan Note (Signed)
Palpable left breast mass started February 2021 (patient lives in the [place]), went to [place] and mammogram and ultrasound 01/03/2020: 8 cm distortion and calcifications left breast 2:00 and 3 abnormal axillary lymph nodes. 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  Treatment Plan:: 1. Neoadjuvant chemotherapy with [place] Perjeta 6 cycles followed by HerceptinPerjeta versus Kadcylamaintenance for 1 year 2. Followed bymastectomy with the targeted node dissection 3. Followed by adjuvant radiation therapy 4.Followed by antiestrogen therapy and neratinib -------------------------------------------------------------------------------------------------------------------------------- Current Treatment: Herceptin Perjeta Maintenance

## 2020-11-18 NOTE — Progress Notes (Signed)
Patient Care Team: Kerin Perna, NP as PCP - General (Internal Medicine) Mauro Kaufmann, RN as Oncology Nurse Navigator Rockwell Germany, RN as Oncology Nurse Navigator Nicholas Lose, MD as Consulting Physician (Hematology and Oncology) Revankar, Reita Cliche, MD as Consulting Physician (Cardiology) Jovita Kussmaul, MD as Consulting Physician (General Surgery)  DIAGNOSIS:    ICD-10-CM   1. Malignant neoplasm of central portion of left breast in female, estrogen receptor positive ([facility])  C50.112    Z17.0       SUMMARY OF ONCOLOGIC HISTORY: Oncology History  Malignant neoplasm of central portion of left breast in female, estrogen receptor positive ([place])   Initial Diagnosis   Palpable left breast mass started February 2021 (patient lives in the [place]), went to [place] and mammogram and ultrasound 01/03/2020: 8 cm distortion and calcifications left breast 2:00 and 3 abnormal axillary lymph nodes.  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 with above-mentioned history of left breast cancer currently on neoadjuvant chemotherapy with [place]. She presents to the clinic today for a toxicity check and Herceptin and Perjeta maintenance.  She is tolerating them Herceptin and Perjeta extremely well.  Does not have any diarrhea or nausea vomiting.  She has right shoulder pain and discomfort.  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  ALLERGIES:  has No Known Allergies.  MEDICATIONS:  Current Outpatient Medications  Medication Sig Dispense Refill   amiodarone  (PACERONE) 200 MG tablet Take 1/2 tablet (100 mg total) by mouth daily. 15 tablet 2   amLODipine (NORVASC) 5 MG tablet Take 1 and 1/2 tablets (7.5 mg total) by mouth daily. 45 tablet 2   anastrozole (ARIMIDEX) 1 MG tablet Take 1 tablet (1 mg total) by mouth daily. 30 tablet 2   apixaban (ELIQUIS) 5 MG TABS tablet Take 1 tablet (5 mg total) by mouth 2 (two) times daily. 60 tablet 2   atorvastatin (LIPITOR) 40 MG tablet Take 1 tablet (40 mg total) by mouth daily. 30 tablet 2   hydrALAZINE (APRESOLINE) 10 MG tablet Take 1 tablet (10 mg total) by mouth every 8 (eight) hours. 90 tablet 2   No current facility-administered medications for this visit.   Facility-Administered Medications Ordered in Other Visits  Medication Dose Route Frequency Provider Last Rate Last Admin   heparin lock flush 100 unit/mL  500 Units Intracatheter Once PRN Nicholas Lose, MD       pertuzumab (PERJETA) 420 mg in sodium chloride 0.9 % 250 mL chemo infusion  420 mg Intravenous Once Nicholas Lose, MD       sodium chloride flush (NS) 0.9 % injection 10 mL  10 mL Intracatheter PRN Nicholas Lose, MD       trastuzumab (HERCEPTIN) 600 mg in sodium chloride 0.9 % 250 mL chemo infusion  600 mg Intravenous Once Nicholas Lose, MD        PHYSICAL EXAMINATION: ECOG PERFORMANCE STATUS: 1 - Symptomatic but completely ambulatory  Vitals:   11/19/20 0830  BP: 138/76  Pulse: 63  Resp: 18  Temp:  97.9 F (36.6 C)  SpO2: 100%   Filed Weights   11/19/20 0830  Weight: 224 lb 1.6 oz (101.7 kg)    LABORATORY DATA:  I have reviewed the data as listed CMP Latest Ref Rng & Units 11/19/2020 10/09/2020 08/26/2020  Glucose 70 - 99 mg/dL 112(H) 110(H) 114(H)  BUN 6 - 20 mg/dL 12 6 9   Creatinine 0.44 - 1.00 mg/dL 0.70 0.62 0.64  Sodium 135 - 145 mmol/L 141 140 141  Potassium 3.5 - 5.1 mmol/L 3.4(L) 3.4(L) 3.6  Chloride 98 - 111 mmol/L 105 104 104  CO2 22 - 32 mmol/L 27 27 26   Calcium 8.9 - 10.3 mg/dL 9.1 8.9 8.4(L)  Total Protein 6.5 -  8.1 g/dL 7.7 7.4 6.8  Total Bilirubin 0.3 - 1.2 mg/dL 1.0 0.7 0.7  Alkaline Phos 38 - 126 U/L 85 84 67  AST 15 - 41 U/L 15 19 19   ALT 0 - 44 U/L 10 9 12     Lab Results  Component Value Date   WBC 6.7 11/19/2020   HGB 11.3 (L) 11/19/2020   HCT 34.8 (L) 11/19/2020   MCV 95.9 11/19/2020   PLT 218 11/19/2020   NEUTROABS 3.3 11/19/2020    ASSESSMENT & PLAN:  Malignant neoplasm of central portion of left breast in female, estrogen receptor positive ([place]) Palpable left breast mass started February 2021 (patient lives in the [place]), went to [place] and mammogram and ultrasound 01/03/2020: 8 cm distortion and calcifications left breast 2:00 and 3 abnormal axillary lymph nodes.  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   Treatment Plan:: 1. Neoadjuvant chemotherapy with [place] Perjeta 6 cycles followed by Herceptin Perjeta versus Kadcyla maintenance for 1 year 2. Followed by mastectomy with the targeted node dissection 3. Followed by adjuvant radiation therapy  4.  Followed by antiestrogen therapy and neratinib -------------------------------------------------------------------------------------------------------------------------------- Current Treatment: Herceptin Perjeta Maintenance Her surgery on the breast is happening on 11/25/2020 I would like to see her a week after her surgery to discuss the pathology report and decide if she needs to go on Kadcyla.  Chemotherapy-induced anemia: Hemoglobin has improved to 11.3. Subsequently we will request radiation consultation.  No orders of the defined types were placed in this encounter.  The patient has a good understanding of the overall plan. she agrees with it. she will call with any problems that may develop before the next visit here.  Total time spent: 30 mins including face to face time and time spent for planning, charting and coordination of care  Rulon Eisenmenger, MD, MPH 11/19/2020  I, Thana Ates, am acting as scribe for Dr. Nicholas Lose.  I have reviewed the above documentation for accuracy and completeness, and I agree with the above.

## 2020-11-19 ENCOUNTER — Inpatient Hospital Stay: Payer: Self-pay

## 2020-11-19 ENCOUNTER — Other Ambulatory Visit: Payer: Self-pay

## 2020-11-19 ENCOUNTER — Other Ambulatory Visit (HOSPITAL_COMMUNITY): Payer: Self-pay

## 2020-11-19 ENCOUNTER — Inpatient Hospital Stay (HOSPITAL_BASED_OUTPATIENT_CLINIC_OR_DEPARTMENT_OTHER): Payer: Self-pay | Admitting: Hematology and Oncology

## 2020-11-19 VITALS — BP 143/79 | HR 62 | Temp 98.3°F | Resp 18

## 2020-11-19 DIAGNOSIS — Z95828 Presence of other vascular implants and grafts: Secondary | ICD-10-CM

## 2020-11-19 DIAGNOSIS — C50112 Malignant neoplasm of central portion of left female breast: Secondary | ICD-10-CM

## 2020-11-19 DIAGNOSIS — Z170 Estrogen receptor positive status [ER+]: Secondary | ICD-10-CM

## 2020-11-19 LAB — CBC WITH DIFFERENTIAL (CANCER CENTER ONLY)
Abs Immature Granulocytes: 0.01 10*3/uL (ref 0.00–0.07)
Basophils Absolute: 0 10*3/uL (ref 0.0–0.1)
Basophils Relative: 0 %
Eosinophils Absolute: 0.2 10*3/uL (ref 0.0–0.5)
Eosinophils Relative: 2 %
HCT: 34.8 % — ABNORMAL LOW (ref 36.0–46.0)
Hemoglobin: 11.3 g/dL — ABNORMAL LOW (ref 12.0–15.0)
Immature Granulocytes: 0 %
Lymphocytes Relative: 40 %
Lymphs Abs: 2.7 10*3/uL (ref 0.7–4.0)
MCH: 31.1 pg (ref 26.0–34.0)
MCHC: 32.5 g/dL (ref 30.0–36.0)
MCV: 95.9 fL (ref 80.0–100.0)
Monocytes Absolute: 0.5 10*3/uL (ref 0.1–1.0)
Monocytes Relative: 7 %
Neutro Abs: 3.3 10*3/uL (ref 1.7–7.7)
Neutrophils Relative %: 51 %
Platelet Count: 218 10*3/uL (ref 150–400)
RBC: 3.63 MIL/uL — ABNORMAL LOW (ref 3.87–5.11)
RDW: 14.8 % (ref 11.5–15.5)
WBC Count: 6.7 10*3/uL (ref 4.0–10.5)
nRBC: 0 % (ref 0.0–0.2)

## 2020-11-19 LAB — CMP (CANCER CENTER ONLY)
ALT: 10 U/L (ref 0–44)
AST: 15 U/L (ref 15–41)
Albumin: 3.3 g/dL — ABNORMAL LOW (ref 3.5–5.0)
Alkaline Phosphatase: 85 U/L (ref 38–126)
Anion gap: 9 (ref 5–15)
BUN: 12 mg/dL (ref 6–20)
CO2: 27 mmol/L (ref 22–32)
Calcium: 9.1 mg/dL (ref 8.9–10.3)
Chloride: 105 mmol/L (ref 98–111)
Creatinine: 0.7 mg/dL (ref 0.44–1.00)
GFR, Estimated: >60 mL/min (ref >60)
Glucose, Bld: 112 mg/dL — ABNORMAL HIGH (ref 70–99)
Potassium: 3.4 mmol/L — ABNORMAL LOW (ref 3.5–5.1)
Sodium: 141 mmol/L (ref 135–145)
Total Bilirubin: 1 mg/dL (ref 0.3–1.2)
Total Protein: 7.7 g/dL (ref 6.5–8.1)

## 2020-11-19 MED ORDER — ACETAMINOPHEN 325 MG PO TABS
650.0000 mg | ORAL_TABLET | Freq: Once | ORAL | Status: AC
Start: 2020-11-19 — End: 2020-11-19
  Administered 2020-11-19: 650 mg via ORAL

## 2020-11-19 MED ORDER — SODIUM CHLORIDE 0.9% FLUSH
10.0000 mL | INTRAVENOUS | Status: DC | PRN
  Administered 2020-11-19: 10 mL
  Filled 2020-11-19: qty 10

## 2020-11-19 MED ORDER — DIPHENHYDRAMINE HCL 25 MG PO CAPS
ORAL_CAPSULE | ORAL | Status: AC
  Filled 2020-11-19: qty 2

## 2020-11-19 MED ORDER — SODIUM CHLORIDE 0.9% FLUSH
10.0000 mL | Freq: Once | INTRAVENOUS | Status: AC
  Administered 2020-11-19: 10 mL
  Filled 2020-11-19: qty 10

## 2020-11-19 MED ORDER — ACETAMINOPHEN 325 MG PO TABS
ORAL_TABLET | ORAL | Status: AC
  Filled 2020-11-19: qty 2

## 2020-11-19 MED ORDER — SODIUM CHLORIDE 0.9 % IV SOLN
Freq: Once | INTRAVENOUS | Status: AC
Start: 2020-11-19 — End: 2020-11-19
  Filled 2020-11-19: qty 250

## 2020-11-19 MED ORDER — DIPHENHYDRAMINE HCL 25 MG PO CAPS
50.0000 mg | ORAL_CAPSULE | Freq: Once | ORAL | Status: AC
  Administered 2020-11-19: 50 mg via ORAL

## 2020-11-19 MED ORDER — HEPARIN SOD (PORK) LOCK FLUSH 100 UNIT/ML IV SOLN
500.0000 [IU] | Freq: Once | INTRAVENOUS | Status: AC | PRN
  Administered 2020-11-19: 500 [IU]
  Filled 2020-11-19: qty 5

## 2020-11-19 MED ORDER — TRASTUZUMAB CHEMO 150 MG IV SOLR
600.0000 mg | Freq: Once | INTRAVENOUS | Status: AC
  Administered 2020-11-19: 600 mg via INTRAVENOUS
  Filled 2020-11-19: qty 28.57

## 2020-11-19 MED ORDER — SODIUM CHLORIDE 0.9 % IV SOLN
420.0000 mg | Freq: Once | INTRAVENOUS | Status: AC
  Administered 2020-11-19: 420 mg via INTRAVENOUS
  Filled 2020-11-19: qty 14

## 2020-11-19 NOTE — Progress Notes (Signed)
Staff messaged and secure chat sent in regards to eliquis hold instructions for upcoming surgery with Dr. Marlou Starks.

## 2020-11-19 NOTE — Telephone Encounter (Signed)
Received question via Epic chat function "Can you advise on Eliquis hold instructions for upcoming surgery (left breast lumpectomy) on 11/25/20 please? Thanks" from [employer], Therapist, sports.  Of note, initial clearance for medical only.   Patient is on Eliquis 5mg  BID due to PAF. Labs 11/19/20 creatinine 0.7, GFR >60, Hb 11.3, Platelets 218.   Will route to pharmacy team for review.   Loel Dubonnet, NP

## 2020-11-19 NOTE — Telephone Encounter (Addendum)
   Name: Courtney Coleman  DOB: 1965-12-21  MRN: [id number]   Primary Cardiologist: Jenean Lindau, MD  Chart reviewed as part of pre-operative protocol coverage.   In response to an epic chat directed to the APP covering preop: "Can you advise on Eliquis hold instructions for upcoming surgery (left breast lumpectomy) on 11/25/20 please? Thanks" from [employer], Therapist, sports.  Per our clinical pharmacist: Patient with diagnosis of afib on Eliquis for anticoagulation.     Procedure: left breast lumpectomy Date of procedure: 11/25/20   CHA2DS2-VASc Score = 6  This indicates a 9.7% annual risk of stroke. The patient's score is based upon: CHF History: Yes HTN History: Yes Diabetes History: Yes Stroke History: Yes Vascular Disease History: No Age Score: 0 Gender Score: 1    CrCl 108 ml/min Platelet count 218   Patient is at high risk off anticoagulation due to hx of CVA and active cancer. I will defer to Dr. Geraldo Pitter as to if she should hold 24hr vs 48 hr   Per Dr. Geraldo Pitter: I agree that she is at high risk and we have stroke so anticoagulation should be held for the minimum time possible if necessary and felt appropriate it would be helpful for a 48-hour, if surgeon feels that the benefits exceed therisks.    To the requesting provider: Please communicate your final recommendations (24 vs 48 hr hold) to the patient in the appropriate amount of time.    I will route this recommendation to the requesting party via Epic fax function and remove from pre-op pool. Please call with questions.  Ledora Bottcher, PA 11/19/2020, 2:37 PM

## 2020-11-19 NOTE — Progress Notes (Signed)
Surgical Instructions    Your procedure is scheduled on 11/25/20.  Report to [facility] Main Entrance "A" at 06:30 A.M., then check in with the Admitting office.  Call this number if you have problems the morning of surgery:  [phone]   If you have any questions prior to your surgery date call [phone]: Open Monday-Friday 8am-4pm    Remember:  Do not eat after midnight the night before your surgery  You may drink clear liquids until 05:30am the morning of your surgery.   Clear liquids allowed are: Water, Non-Citrus Juices (without pulp), Carbonated Beverages, Clear Tea, Black Coffee Only, and Gatorade    Take these medicines the morning of surgery with A SIP OF WATER  amiodarone (PACERONE)  amLODipine (NORVASC)  anastrozole (ARIMIDEX)  atorvastatin (LIPITOR)  hydrALAZINE (APRESOLINE)   As of today, STOP taking any Aspirin (unless otherwise instructed by your surgeon) Aleve, Naproxen, Ibuprofen, Motrin, Advil, Goody's, BC's, all herbal medications, fish oil, and all vitamins.  Please follow your surgeons instructions on when to stop taking apixaban (ELIQUIS).  WHAT DO I DO ABOUT MY DIABETES MEDICATION?   Do not take oral diabetes medicines (pills) the morning of surgery.  The day of surgery, do not take other diabetes injectables, including Byetta (exenatide), Bydureon (exenatide ER), Victoza (liraglutide), or Trulicity (dulaglutide).     HOW TO MANAGE YOUR DIABETES BEFORE AND AFTER SURGERY  Why is it important to control my blood sugar before and after surgery? Improving blood sugar levels before and after surgery helps healing and can limit problems. A way of improving blood sugar control is eating a healthy diet by:  Eating less sugar and carbohydrates  Increasing activity/exercise  Talking with your doctor about reaching your blood sugar goals High blood sugars (greater than 180 mg/dL) can raise your risk of infections and slow your recovery, so you will need  to focus on controlling your diabetes during the weeks before surgery. Make sure that the doctor who takes care of your diabetes knows about your planned surgery including the date and location.  How do I manage my blood sugar before surgery? Check your blood sugar at least 4 times a day, starting 2 days before surgery, to make sure that the level is not too high or low.  Check your blood sugar the morning of your surgery when you wake up and every 2 hours until you get to the Short Stay unit.  If your blood sugar is less than 70 mg/dL, you will need to treat for low blood sugar: Do not take insulin. Treat a low blood sugar (less than 70 mg/dL) with  cup of clear juice (cranberry or apple), 4 glucose tablets, OR glucose gel. Recheck blood sugar in 15 minutes after treatment (to make sure it is greater than 70 mg/dL). If your blood sugar is not greater than 70 mg/dL on recheck, call [phone] for further instructions. Report your blood sugar to the short stay nurse when you get to Short Stay.  If you are admitted to the hospital after surgery: Your blood sugar will be checked by the staff and you will probably be given insulin after surgery (instead of oral diabetes medicines) to make sure you have good blood sugar levels. The goal for blood sugar control after surgery is 80-180 mg/dL.           Do not wear jewelry or makeup Do not wear lotions, powders, perfumes/colognes, or deodorant. Do not shave 48 hours prior to surgery.  Men may  shave face and neck. Do not bring valuables to the hospital.  DO Not wear nail polish, gel polish, artificial nails, or any other type of covering on natural nails  including finger and toenails. If patients have artificial nails, gel coating, etc. that need to be removed by a nail salon please have this removed prior to surgery or surgery may need to be canceled/delayed if the surgeon/ anesthesia feels like the patient is unable to be adequately  monitored.             [facility] is not responsible for any belongings or valuables.  Do NOT Smoke (Tobacco/Vaping) or drink Alcohol 24 hours prior to your procedure If you use a CPAP at night, you may bring all equipment for your overnight stay.   Contacts, glasses, dentures or bridgework may not be worn into surgery, please bring cases for these belongings   For patients admitted to the hospital, discharge time will be determined by your treatment team.   Patients discharged the day of surgery will not be allowed to drive home, and someone needs to stay with them for 24 hours.  ONLY 1 SUPPORT PERSON MAY BE PRESENT WHILE YOU ARE IN SURGERY. IF YOU ARE TO BE ADMITTED ONCE YOU ARE IN YOUR ROOM YOU WILL BE ALLOWED TWO (2) VISITORS.  Minor children may have two parents present. Special consideration for safety and communication needs will be reviewed on a case by case basis.  Special instructions:    Oral Hygiene is also important to reduce your risk of infection.  Remember - BRUSH YOUR TEETH THE MORNING OF SURGERY WITH YOUR REGULAR TOOTHPASTE   [facility]- Preparing For Surgery  Before surgery, you can play an important role. Because skin is not sterile, your skin needs to be as free of germs as possible. You can reduce the number of germs on your skin by washing with CHG (chlorahexidine gluconate) Soap before surgery.  CHG is an antiseptic cleaner which kills germs and bonds with the skin to continue killing germs even after washing.     Please do not use if you have an allergy to CHG or antibacterial soaps. If your skin becomes reddened/irritated stop using the CHG.  Do not shave (including legs and underarms) for at least 48 hours prior to first CHG shower. It is OK to shave your face.  Please follow these instructions carefully.     Shower the NIGHT BEFORE SURGERY and the MORNING OF SURGERY with CHG Soap.   If you chose to wash your hair, wash your hair first as usual with your  normal shampoo. After you shampoo, rinse your hair and body thoroughly to remove the shampoo.  Then [employer] and genitals (private parts) with your normal soap and rinse thoroughly to remove soap.  After that Use CHG Soap as you would any other liquid soap. You can apply CHG directly to the skin and wash gently with a scrungie or a clean washcloth.   Apply the CHG Soap to your body ONLY FROM THE NECK DOWN.  Do not use on open wounds or open sores. Avoid contact with your eyes, ears, mouth and genitals (private parts). Wash Face and genitals (private parts)  with your normal soap.   Wash thoroughly, paying special attention to the area where your surgery will be performed.  Thoroughly rinse your body with warm water from the neck down.  DO NOT shower/wash with your normal soap after using and rinsing off the CHG Soap.  Pat yourself dry with a CLEAN TOWEL.  Wear CLEAN PAJAMAS to bed the night before surgery  Place CLEAN SHEETS on your bed the night before your surgery  DO NOT SLEEP WITH PETS.   Day of Surgery: Take a shower with CHG soap. Wear Clean/Comfortable clothing the morning of surgery Do not apply any deodorants/lotions.   Remember to brush your teeth WITH YOUR REGULAR TOOTHPASTE.   Please read over the following fact sheets that you were given.

## 2020-11-19 NOTE — Progress Notes (Signed)
Pt. stable for discharge after 30 minute post treatment observation. Left via ambulation, no respiratory distress noted.

## 2020-11-19 NOTE — Telephone Encounter (Signed)
Patient with diagnosis of afib on Eliquis for anticoagulation.    Procedure: left breast lumpectomy Date of procedure: 11/25/20  CHA2DS2-VASc Score = 6  This indicates a 9.7% annual risk of stroke. The patient's score is based upon: CHF History: Yes HTN History: Yes Diabetes History: Yes Stroke History: Yes Vascular Disease History: No Age Score: 0 Gender Score: 1    CrCl 108 ml/min Platelet count 218  Patient is at high risk off anticoagulation due to hx of CVA and active cancer. I will defer to Dr. Geraldo Pitter as to if she should hold 24hr vs 48 hr

## 2020-11-19 NOTE — Patient Instructions (Addendum)
[facility]  Discharge Instructions: Thank you for choosing [facility] to provide your oncology and hematology care.   If you have a lab appointment with the [facility], please go directly to the [facility] and check in at the registration area.   Wear comfortable clothing and clothing appropriate for easy access to any Portacath or PICC line.   We strive to give you quality time with your provider. You may need to reschedule your appointment if you arrive late (15 or more minutes).  Arriving late affects you and other patients whose appointments are after yours.  Also, if you miss three or more appointments without notifying the office, you may be dismissed from the clinic at the provider's discretion.      For prescription refill requests, have your pharmacy contact our office and allow 72 hours for refills to be completed.    Today you received the following chemotherapy and/or immunotherapy agents: Trastuzumab (Herceptin) and Pertuzumab (Perjeta).     To help prevent nausea and vomiting after your treatment, we encourage you to take your nausea medication as directed.  BELOW ARE SYMPTOMS THAT SHOULD BE REPORTED IMMEDIATELY: *FEVER GREATER THAN 100.4 F (38 C) OR HIGHER *CHILLS OR SWEATING *NAUSEA AND VOMITING THAT IS NOT CONTROLLED WITH YOUR NAUSEA MEDICATION *UNUSUAL SHORTNESS OF BREATH *UNUSUAL BRUISING OR BLEEDING *URINARY PROBLEMS (pain or burning when urinating, or frequent urination) *BOWEL PROBLEMS (unusual diarrhea, constipation, pain near the anus) TENDERNESS IN MOUTH AND THROAT WITH OR WITHOUT PRESENCE OF ULCERS (sore throat, sores in mouth, or a toothache) UNUSUAL RASH, SWELLING OR PAIN  UNUSUAL VAGINAL DISCHARGE OR ITCHING   Items with * indicate a potential emergency and should be followed up as soon as possible or go to the Emergency Department if any problems should occur.  Please show the CHEMOTHERAPY ALERT CARD or  IMMUNOTHERAPY ALERT CARD at check-in to the Emergency Department and triage nurse.  Should you have questions after your visit or need to cancel or reschedule your appointment, please contact [facility]  Dept: [phone]  and follow the prompts.  Office hours are 8:00 a.m. to 4:30 p.m. Monday - Friday. Please note that voicemails left after 4:00 p.m. may not be returned until the following business day.  We are closed weekends and major holidays. You have access to a nurse at all times for urgent questions. Please call the main number to the clinic Dept: [phone] and follow the prompts.   For any non-urgent questions, you may also contact your provider using MyChart. We now offer e-Visits for anyone 46 and older to request care online for non-urgent symptoms. For details visit [email or web address].   Also download the MyChart app! Go to the app store, search "MyChart", open the app, select [facility], and log in with your MyChart username and password.  Due to Covid, a mask is required upon entering the hospital/clinic. If you do not have a mask, one will be given to you upon arrival. For doctor visits, patients may have 1 support person aged 54 or older with them. For treatment visits, patients cannot have anyone with them due to current Covid guidelines and our immunocompromised population.   Hypokalemia Hypokalemia means that the amount of potassium in the blood is lower than normal. Potassium is a chemical (electrolyte) that helps regulate the amount of fluid in the body. It also stimulates muscle tightening (contraction) and helps nerves work properly. Normally, most of the body's  potassium is inside cells, and only a very small amount is in the blood. Because the amount in the blood is so small, minorchanges to potassium levels in the blood can be life-threatening. What are the causes? This condition may be caused by: Antibiotic medicine. Diarrhea or  vomiting. Taking too much of a medicine that helps you have a bowel movement (laxative) can cause diarrhea and lead to hypokalemia. Chronic kidney disease (CKD). Medicines that help the body get rid of excess fluid (diuretics). Eating disorders, such as bulimia. Low magnesium levels in the body. Sweating a lot. What are the signs or symptoms? Symptoms of this condition include: Weakness. Constipation. Fatigue. Muscle cramps. Mental confusion. Skipped heartbeats or irregular heartbeat (palpitations). Tingling or numbness. How is this diagnosed? This condition is diagnosed with a blood test. How is this treated? This condition may be treated by: Taking potassium supplements by mouth. Adjusting the medicines that you take. Eating more foods that contain a lot of potassium. If your potassium level is very low, you may need to get potassium through anIV and be monitored in the hospital. Follow these instructions at home:  Take over-the-counter and prescription medicines only as told by your health care provider. This includes vitamins and supplements. Eat a healthy diet. A healthy diet includes fresh fruits and vegetables, whole grains, healthy fats, and lean proteins. If instructed, eat more foods that contain a lot of potassium. This includes: Nuts, such as peanuts and pistachios. Seeds, such as sunflower seeds and pumpkin seeds. Peas, lentils, and lima beans. Whole grain and bran cereals and breads. Fresh fruits and vegetables, such as apricots, avocado, bananas, cantaloupe, kiwi, oranges, tomatoes, asparagus, and potatoes. Orange juice. Tomato juice. Red meats. Yogurt. Keep all follow-up visits as told by your health care provider. This is important. Contact a health care provider if you: Have weakness that gets worse. Feel your heart pounding or racing. Vomit. Have diarrhea. Have diabetes (diabetes mellitus) and you have trouble keeping your blood sugar (glucose) in your  target range. Get help right away if you: Have chest pain. Have shortness of breath. Have vomiting or diarrhea that lasts for more than 2 days. Faint. Summary Hypokalemia means that the amount of potassium in the blood is lower than normal. This condition is diagnosed with a blood test. Hypokalemia may be treated by taking potassium supplements, adjusting the medicines that you take, or eating more foods that are high in potassium. If your potassium level is very low, you may need to get potassium through an IV and be monitored in the hospital. This information is not intended to replace advice given to you by your health care provider. Make sure you discuss any questions you have with your healthcare provider. Document Revised: 12/27/2017 Document Reviewed: 12/27/2017 Elsevier Patient Education  [place].

## 2020-11-20 ENCOUNTER — Telehealth: Payer: Self-pay | Admitting: Hematology and Oncology

## 2020-11-20 ENCOUNTER — Other Ambulatory Visit: Payer: Self-pay

## 2020-11-20 ENCOUNTER — Encounter (HOSPITAL_COMMUNITY): Payer: Self-pay

## 2020-11-20 ENCOUNTER — Encounter (HOSPITAL_COMMUNITY)
Admission: RE | Admit: 2020-11-20 | Discharge: 2020-11-20 | Disposition: A | Discharge status: Home / Self Care | Payer: Self-pay | Source: Ambulatory Visit | Attending: General Surgery | Admitting: General Surgery

## 2020-11-20 DIAGNOSIS — Z01812 Encounter for preprocedural laboratory examination: Secondary | ICD-10-CM | POA: Insufficient documentation

## 2020-11-20 HISTORY — DX: Headache, unspecified: R51.9

## 2020-11-20 LAB — HEMOGLOBIN A1C
Hgb A1c MFr Bld: 6 % — ABNORMAL HIGH (ref 4.8–5.6)
Mean Plasma Glucose: 125.5 mg/dL

## 2020-11-20 NOTE — Progress Notes (Signed)
Anesthesia Chart Review:  Case: [id number] Date/Time: 11/25/20 0815   Procedures:      LEFT BREAST LUMPECTOMY WITH RADIOACTIVE SEED AND SENTINEL LYMPH NODE BIOPSY (Left: Breast)     TARGETED NODE DISSECTION (Left: Breast)   Anesthesia type: General   Pre-op diagnosis: LEFT BREAST CANCER   Location: [place] OR ROOM 09 / [place] [facility]   Surgeons: Jovita Kussmaul, MD       DISCUSSION: Patient is a 55 year old female scheduled for the above procedure.  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 S/p subcutaneous Port placement by IR 05/06/20. BMI is consistent with obesity.  Per record review, patient was living in the [place]. She initially palpated a left breast mass in February 2021. 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 LHC showed normal coronaries, EF 35-40%. She later moved near [place], [place] to be closer to family/fiance to start breast cancer therapy; however on 03/27/20 she was brought to [facility] ED with right foot drop since the night before and with mild confusion. MRI/MRA brain showed multiple left ischemic infarcts with occluded left PCA branches at P1 and P2. No source of CVA on echo or carotid imaging. Afib felt to be the culprit. Neurology consulted. She was changed from Xarelto to Eliquis. She was discharged to CIR for ongoing therapy (04/01/20-04/21/20). While in [place] cardiologist Dr. Charolette Forward consulted for management of HTN, bradycardia.  Out-patient oncology arranged to discuss treatment of left breast cancer, first visit with Dr. Lindi Adie 04/28/20. Per multidisciplinary tumor board discussion: 1. Neoadjuvant chemotherapy with TCH Perjeta 6 cycles followed by Herceptin Perjeta versus Kadcyla maintenance for 1 year 2. Followed by mastectomy with the targeted node dissection 3. Followed by adjuvant radiation therapy  4.  Followed by antiestrogen therapy and neratinib  Preoperative cardiology input outlined by Coletta Memos, NP on 11/20/20.  "Chart reviewed as part of pre-operative protocol coverage. Given past medical history and time since last visit, based on ACC/AHA guidelines, Courtney Coleman would be at acceptable risk for the planned procedure without further cardiovascular testing...   CHA2DS2-VASc Score = 6  .Marland KitchenMarland KitchenCrCl 108 ml/min Platelet count 218   Per Dr. Geraldo Pitter: I agree that she is at high risk and we have stroke so anticoagulation should be held for the minimum time possible if necessary and felt appropriate it would be helpful for a 48-hour, if surgeon feels that the benefits exceed the risks."  Per PAT RN notes, patient holding Eliquis 24-48 hours prior to surgery.   Last Herceptin and Perjeta 11/19/20. She is to follow-up with Dr. Lindi Adie one week after surgery to discuss pathology and next steps.   RSL 11/24/20 at 1:00 PM. Anesthesia team to evaluate on the day of surgery.    VS: BP (!) 152/84   Pulse 65   Temp 36.7 C (Oral)   Resp 18   Ht 5' 5"  (1.651 m)   Wt 102 kg   SpO2 100%   BMI 37.43 kg/m    PROVIDERS: Kerin Perna, NP  is PCP  - Jyl Heinz, MD is cardiologist. Established 09/29/20 for preoperative evaluation. After reviewing records from her 01/2020 hospitalization at [facility], he wrote, "Patient had paroxysmal atrial fibrillation and underwent TEE guided cardioversion.  Also coronary angiography was normal.  In view of these findings and the fact that she has good effort  tolerance is not at high risk for coronary events during the aforementioned procedure.  Medical hemodynamic monitoring will further reduce the risk of coronary events.." - Nicholas Lose, MD is HEM-ONC. Last visit 11/19/20. She is on Herceptin Perjeta maintenance. He plans to see her a week after surgery to discuss pathology report and next steps.  Rosalin Hawking, MD is neurologist   LABS: Most recent lab results include: Lab Results  Component Value Date   WBC 6.7 11/19/2020   HGB 11.3 (L) 11/19/2020   HCT 34.8 (L) 11/19/2020   PLT 218 11/19/2020   GLUCOSE 112 (H) 11/19/2020   ALT 10 11/19/2020   AST 15 11/19/2020   NA 141 11/19/2020   K 3.4 (L) 11/19/2020   CL 105 11/19/2020   CREATININE 0.70 11/19/2020   BUN 12 11/19/2020   CO2 27 11/19/2020   HGBA1C 6.0 (H) 11/20/2020     IMAGES: CT Chest/abd/pelvis 05/13/20: IMPRESSION: 1. No evidence for metastatic disease in the chest, abdomen, or pelvis. 2. 3 mm ground-glass opacity anterior right upper lobe, likely benign. Attention on follow-up recommended. 3. 18 mm low-density lesion interpolar left kidney has attenuation higher than would be expected for a simple cyst but is likely a cyst complicated by proteinaceous debris or hemorrhage. Attention on follow-up recommended to ensure stability. 4. 2.9 cm benign appearing cyst in the right ovary. No specific imaging follow-up recommended. This recommendation follows ACR consensus guidelines: White Paper of the ACR Incidental Findings Committee II on Adnexal Findings. J Am Coll Radiol 2013:10:675-681. 5. 13 mm hypoattenuating right thyroid nodule. Not clinically significant; no follow-up imaging recommended (ref: J Am Coll Radiol. 2015 Feb;12(2): 143-50). 6. Aortic Atherosclerosis (ICD10-I70.0).   MRI/MRA head/neck 03/28/20: IMPRESSION: 1. Multifocal acute ischemia within the dorsomedial left thalamus and left cerebral peduncle. There are also small foci in the splenium of the  corpus callosum and the left basal ganglia. 2. 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 3. Otherwise normal MRA of the head and neck.    EKG: 10/09/20: NSR   CV: Echo 11/04/20: IMPRESSIONS   1. Left ventricular ejection fraction, by estimation, is 60 to 65%. The  left ventricle has normal function. The left ventricle has no regional  wall motion abnormalities. There is mild left ventricular hypertrophy.  Left ventricular diastolic parameters  are consistent with Grade I diastolic dysfunction (impaired relaxation).  The average left ventricular global longitudinal strain is -20.0 %. The  global longitudinal strain is normal.   2. Right ventricular systolic function is normal. The right ventricular  size is normal.   3. Left atrial size was moderately dilated.   4. The mitral valve is normal in structure. Mild mitral valve  regurgitation. No evidence of mitral stenosis. Moderate mitral annular  calcification.   5. The aortic valve is calcified. There is moderate calcification of the  aortic valve. There is moderate thickening of the aortic valve. Aortic  valve regurgitation is not visualized. Mild to moderate aortic valve  sclerosis/calcification is present,  without any evidence of aortic stenosis.   6. The inferior vena cava is normal in size with greater than 50%  respiratory variability, suggesting right atrial pressure of 3 mmHg.  - Comparison(s): No significant change from prior study. Prior images  reviewed side by side.  - Conclusion(s)/Recommendation(s): No intracardiac source of embolism  detected on this transthoracic study. A transesophageal echocardiogram is  recommended to exclude cardiac source of embolism if clinically indicated.    TEE  with DCCV 02/26/20 [facility], scanned under Media tab, 10/23/20): Impression: EF 40%.  No LAA thrombus.  Negative bubble study.  Mild to moderate mitral regurgitation.  Successful  cardioversion.   Cardiac cath 02/25/20 [facility], scanned under Media tab, 10/23/20): Findings: 1.  Atrial fibrillation, heart rate 81. 2.  LV 124/24, AO 120/74.  No evidence of aortic stenosis. 3.  Left ventricular angiogram revealed ejection fraction of 35 to 40%.  Mild mitral regurgitation. 4.  Coronary arteries normal.  Right coronary artery is dominant.   Past Medical History:  Diagnosis Date   Acute blood loss anemia    Alteration of sensations, post-stroke    Anemia 03/28/2020   Benign essential HTN    Bradycardia    Breast cancer ([place])    Cerebral embolism with cerebral infarction 03/28/2020   CHF (congestive heart failure) (HCC)    Controlled type 2 diabetes mellitus with hyperglycemia, without long-term current use of insulin (HCC)    CVA (cerebral vascular accident) ([place]) 03/29/2020   Dysesthesia    Dysrhythmia    Essential hypertension    Headache    migraines   Hypertension    Malignant neoplasm of central portion of left breast in female, estrogen receptor positive (HCC)    PAF (paroxysmal atrial fibrillation) ([place]) 03/28/2020   Port-A-Cath in place 05/14/2020   Right foot drop 03/28/2020   Right hemiparesis (HCC)    Slow transit constipation    Thalamic stroke ([place]) 04/01/2020    Past Surgical History:  Procedure Laterality Date   BREAST BIOPSY     CARDIAC CATHETERIZATION  01/2020   [facility]   IR IMAGING GUIDED PORT INSERTION  05/06/2020    MEDICATIONS:  acetaminophen (TYLENOL) 500 MG tablet   amiodarone (PACERONE) 200 MG tablet   amLODipine (NORVASC) 5 MG tablet   anastrozole (ARIMIDEX) 1 MG tablet   apixaban (ELIQUIS) 5 MG TABS tablet   atorvastatin (LIPITOR) 40 MG tablet   hydrALAZINE (APRESOLINE) 10 MG tablet   No current facility-administered medications for this encounter.    Myra Gianotti, PA-C Surgical Short Stay/Anesthesiology [facility] Phone [phone] [facility] Phone [phone] 11/20/2020 5:31 PM

## 2020-11-20 NOTE — Progress Notes (Signed)
PCP - Kerin Perna, NP Cardiologist - Marlana Latus, MD  PPM/ICD - Denies  Chest x-ray - N/A EKG - 10/09/20 Stress Test - 09/21 @ [facility] ECHO - 11/04/20 Cardiac Cath - 09/21 @ [facility]  Sleep Study - No CPAP - N/A  Pt recently diagnosed with DM. Does not check CBGs. A1c obtained. Diabetes education given with regards to diet and exercise changes.  Blood Thinner Instructions: ~ 24-48 hours hold Eliquis Aspirin Instructions: N/A  ERAS Protcol - Yes PRE-SURGERY Ensure or G2- Not ordered  COVID TEST- N/A; Ambulatory   Anesthesia review: Yes, cardiac hx.; cardiac clearance 10/22/20  Patient denies shortness of breath, fever, cough and chest pain at PAT appointment   All instructions explained to the patient, with a verbal understanding of the material. Patient agrees to go over the instructions while at home for a better understanding. Patient also instructed to self quarantine after being tested for COVID-19. The opportunity to ask questions was provided.

## 2020-11-20 NOTE — Anesthesia Preprocedure Evaluation (Addendum)
Anesthesia Evaluation  Patient identified by MRN, date of birth, ID band Patient awake    Reviewed: Allergy & Precautions, NPO status , Patient's Chart, lab work & pertinent test results  Airway Mallampati: II  TM Distance: >3 FB Neck ROM: Full    Dental  (+) Edentulous Upper, Missing   Pulmonary neg pulmonary ROS,    Pulmonary exam normal breath sounds clear to auscultation       Cardiovascular hypertension, Pt. on medications +CHF  Normal cardiovascular exam Rhythm:Regular Rate:Normal  ECG: NSR, rate 70  ECHO: 1. Left ventricular ejection fraction, by estimation, is 60 to 65%. The left ventricle has normal function. The left ventricle has no regional wall motion abnormalities. There is mild left ventricular hypertrophy. Left ventricular diastolic parameters are consistent with Grade I diastolic dysfunction (impaired relaxation). The average left ventricular global longitudinal strain is -20.0 %. The global longitudinal strain is normal. 2. Right ventricular systolic function is normal. The right ventricular size is normal. 3. Left atrial size was moderately dilated. 4. The mitral valve is normal in structure. Mild mitral valve regurgitation. No evidence of mitral stenosis. Moderate mitral annular calcification. 5. The aortic valve is calcified. There is moderate calcification of the aortic valve. There is moderate thickening of the aortic valve. Aortic valve regurgitation is not visualized. Mild to moderate aortic valve sclerosis/calcification is present, without any evidence of aortic stenosis. 6. The inferior vena cava is normal in size with greater than 50% respiratory variability, suggesting right atrial pressure of 3 mmHg.   Neuro/Psych  Headaches, Right sided numbness CVA, Residual Symptoms negative psych ROS   GI/Hepatic negative GI ROS, Neg liver ROS,   Endo/Other  diabetes  Renal/GU negative Renal ROS      Musculoskeletal   Abdominal (+) + obese,   Peds  Hematology  (+) anemia , HLD   Anesthesia Other Findings LEFT BREAST CANCER  Reproductive/Obstetrics                            Anesthesia Physical Anesthesia Plan  ASA: 3  Anesthesia Plan: General and Regional   Post-op Pain Management: GA combined w/ Regional for post-op pain   Induction: Intravenous  PONV Risk Score and Plan: 3 and Ondansetron, Dexamethasone, Midazolam and Treatment may vary due to age or medical condition  Airway Management Planned: LMA  Additional Equipment:   Intra-op Plan:   Post-operative Plan: Extubation in OR  Informed Consent: I have reviewed the patients History and Physical, chart, labs and discussed the procedure including the risks, benefits and alternatives for the proposed anesthesia with the patient or authorized representative who has indicated his/her understanding and acceptance.     Dental advisory given  Plan Discussed with: CRNA  Anesthesia Plan Comments: (Reviewed PAT note written 11/20/2020 by Myra Gianotti, PA-C. )       Anesthesia Quick Evaluation

## 2020-11-20 NOTE — Telephone Encounter (Signed)
Ebony Hail, RN with pre-op, is following up regarding the recommendation. She states it is unclear and is requesting clarification or exact amount of time patient is to hold Eliquis prior to procedure.  Please advise.

## 2020-11-20 NOTE — Telephone Encounter (Signed)
   Primary Cardiologist: Jenean Lindau, MD  Chart reviewed as part of pre-operative protocol coverage. Given past medical history and time since last visit, based on ACC/AHA guidelines, Courtney Coleman would be at acceptable risk for the planned procedure without further cardiovascular testing.   Per our clinical pharmacist: Patient with diagnosis of afib on Eliquis for anticoagulation.     Procedure: left breast lumpectomy Date of procedure: 11/25/20   CHA2DS2-VASc Score = 6  This indicates a 9.7% annual risk of stroke. The patient's score is based upon: CHF History: Yes HTN History: Yes Diabetes History: Yes Stroke History: Yes Vascular Disease History: No Age Score: 0 Gender Score: 1    CrCl 108 ml/min Platelet count 218  Per Dr. Geraldo Pitter: I agree that she is at high risk and we have stroke so anticoagulation should be held for the minimum time possible if necessary and felt appropriate it would be helpful for a 48-hour, if surgeon feels that the benefits exceed the risks.    I will route this recommendation to the requesting party via Epic fax function and remove from pre-op pool.  Please call with questions.  Courtney Coleman. Courtney Guarino NP-C    11/20/2020, 10:20 AM [address] Office [phone] Fax [phone]

## 2020-11-20 NOTE — Telephone Encounter (Signed)
Scheduled appointment per 06/23 los. Left message.

## 2020-11-24 ENCOUNTER — Other Ambulatory Visit: Payer: Self-pay

## 2020-11-24 ENCOUNTER — Ambulatory Visit
Admission: RE | Admit: 2020-11-24 | Discharge: 2020-11-24 | Disposition: A | Discharge status: Home / Self Care | Payer: Self-pay | Source: Ambulatory Visit | Attending: General Surgery | Admitting: General Surgery

## 2020-11-24 DIAGNOSIS — Z170 Estrogen receptor positive status [ER+]: Secondary | ICD-10-CM

## 2020-11-24 DIAGNOSIS — C50112 Malignant neoplasm of central portion of left female breast: Secondary | ICD-10-CM

## 2020-11-24 IMAGING — US US PLC BREAST LOC DEV 1ST LESION INC US GUIDE*L*
1 series · 7 of 7 positions shown · non-contrast
Comparison: Previous exam(s).

CLINICAL DATA: Pre lumpectomy localization of a left breast
invasive ductal carcinoma in the 2 o'clock position posteriorly and
metastatic left axillary lymph node, biopsied in [place].

EXAM:
ULTRASOUND-GUIDED RADIOACTIVE SEED LOCALIZATION OF THE LEFT AXILLA
MAMMOGRAPHIC GUIDED RADIOACTIVE SEED LOCALIZATION OF THE LEFT BREAST

[Series 1: us plc breast loc dev 1st lesion inc us guide*left · 0.07mm/px · 7 of 7 slices shown]
[im 1/7]
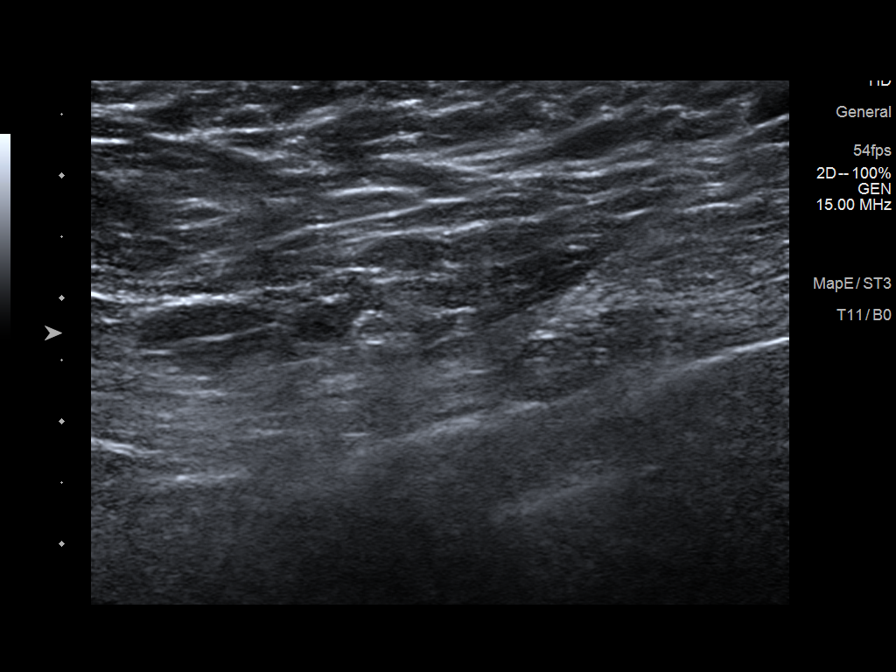
[im 2/7]
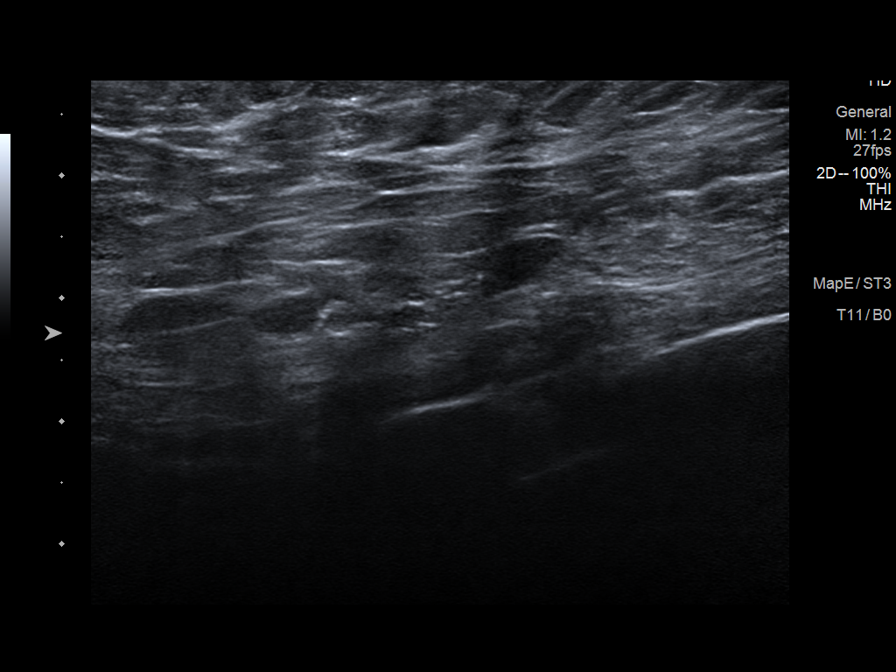
[im 3/7]
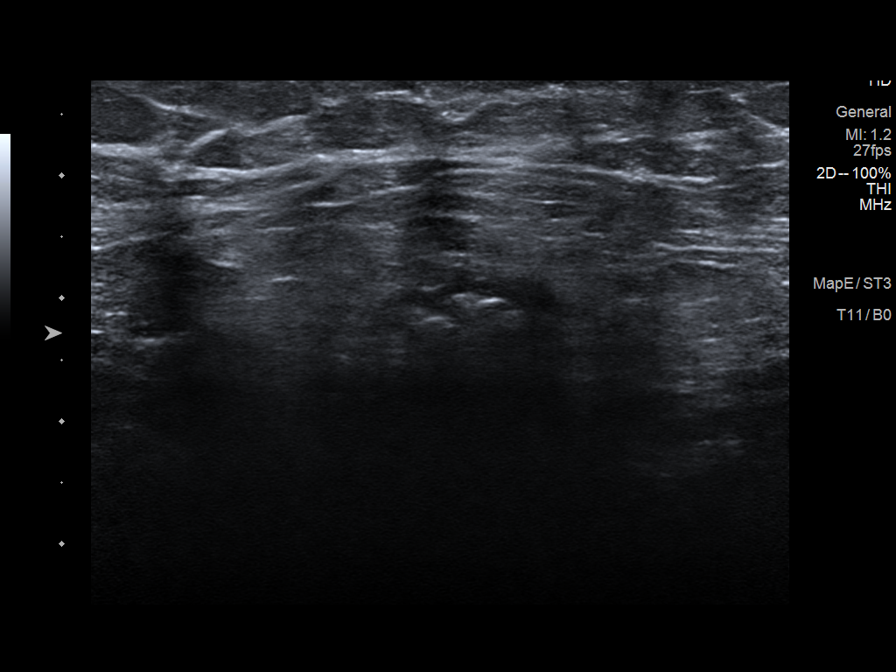
[im 4/7]
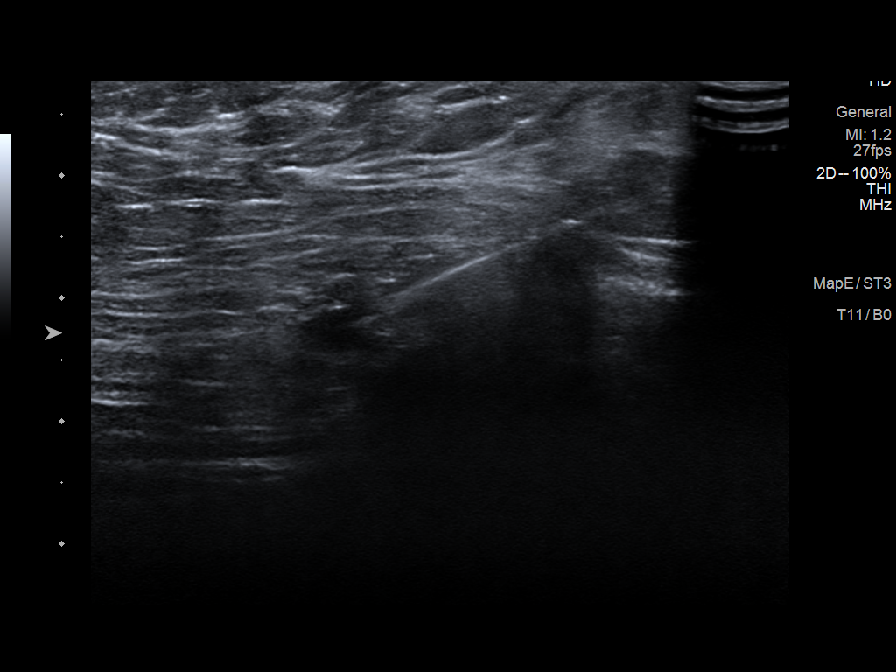
[im 5/7]
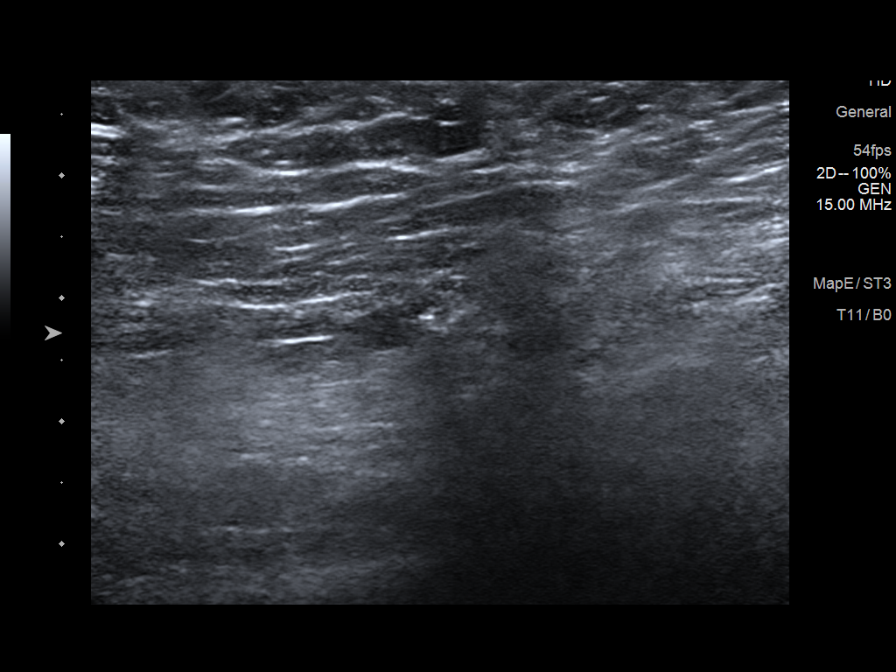
[im 6/7]
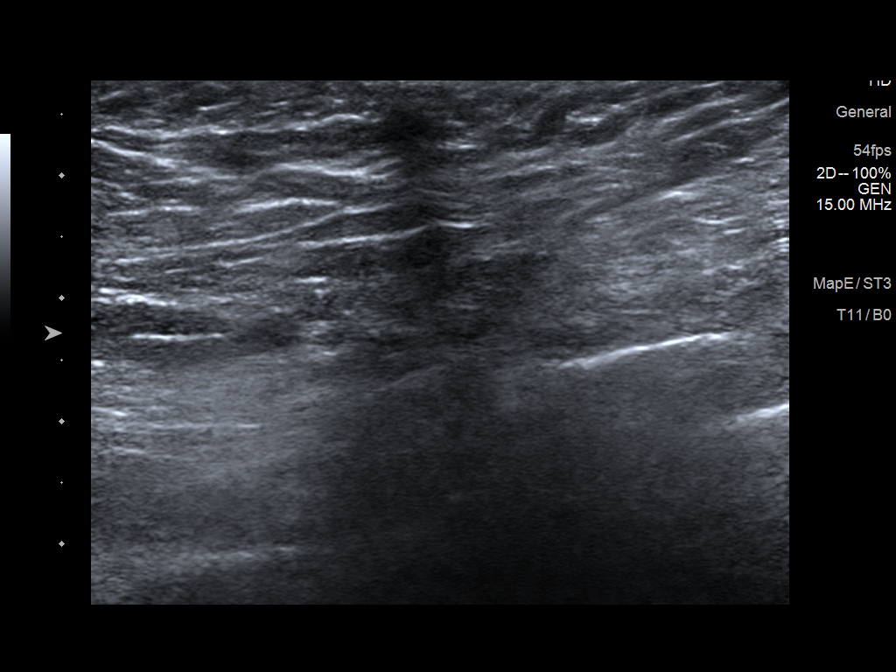
[im 7/7]
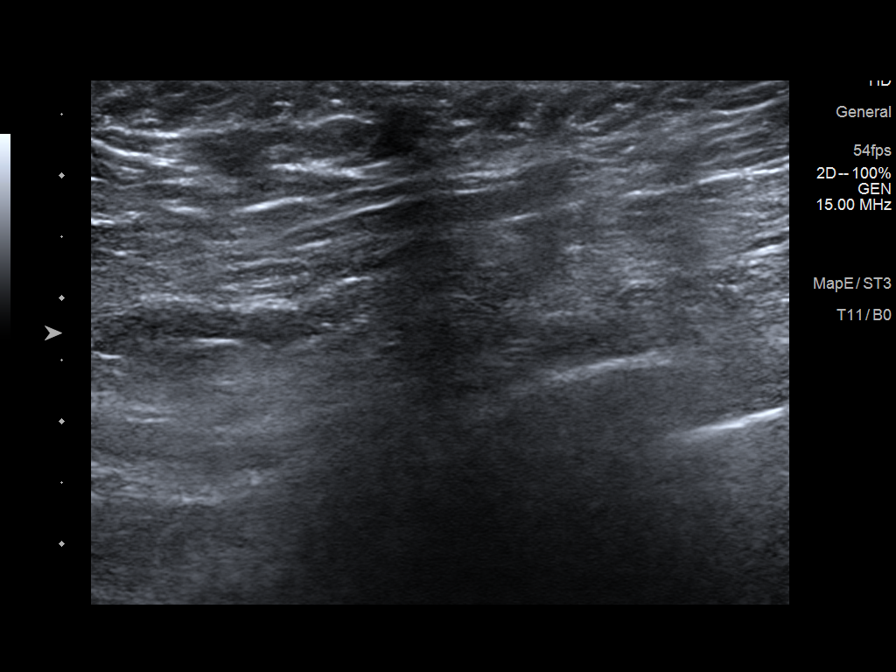

[7 of 7 positions shown; findings below may reference images not displayed]

FINDINGS: Patient presents for radioactive seed localization prior to left
lumpectomy. I met with the patient and we discussed the procedure of
seed localization including benefits and alternatives. We discussed
the high likelihood of successful procedures. We discussed the risks
of the procedures including infection, bleeding, tissue injury and
further surgery. We discussed the low dose of radioactivity involved
in the procedures. Informed, written consent was given.

The usual time-out protocol was performed immediately prior to the
procedures.

SEED 1: METASTATIC LEFT AXILLARY LYMPH NODE CONTAINING A BIOPSY
MARKER CLIP

Using ultrasound guidance, sterile technique, 1% lidocaine and an
[LC] radioactive seed, the previously biopsied metastatic left
axillary lymph node containing a biopsy marker clip was localized
using a caudal approach. The follow-up mammogram images confirm the
seed in the expected location and were marked for Dr. BARGER.

Follow-up survey of the patient confirms presence of the radioactive
seed.

Order number of [LC] seed:  [PHONE_NUMBER].

Total activity:  0.244 mCi reference Date: [DATE]

SEED 2: BIOPSY MARKER CLIP IN THE POSTERIOR UPPER OUTER LEFT BREAST

Using mammographic guidance, sterile technique, 1% lidocaine and an
[LC] radioactive seed, the biopsy marker clip in the posterior
upper outer left breast was localized using a lateral approach. The
follow-up mammogram images confirm the seed in the expected location
and were marked for Dr. BARGER.

Follow-up survey of the patient confirms presence of the radioactive
seed.

Order number of [LC] seed:  [PHONE_NUMBER].

Total activity:  0.241 mCi reference Date: [DATE]

The patient tolerated the procedures well and was released from the
[REDACTED]. She was given instructions regarding seed removal.
IMPRESSION: Radioactive seed localization left axilla and left breast. No
apparent complications.

## 2020-11-24 IMAGING — MG MM PLC BREAST LOC DEV 1ST LESION INC MAMMO GUIDE*L*
8 series · 8 of 8 positions shown · non-contrast
Comparison: Previous exam(s).

CLINICAL DATA: Pre lumpectomy localization of a left breast
invasive ductal carcinoma in the 2 o'clock position posteriorly and
metastatic left axillary lymph node, biopsied in [place].

EXAM:
ULTRASOUND-GUIDED RADIOACTIVE SEED LOCALIZATION OF THE LEFT AXILLA
MAMMOGRAPHIC GUIDED RADIOACTIVE SEED LOCALIZATION OF THE LEFT BREAST

[L CC (1 of 3)]
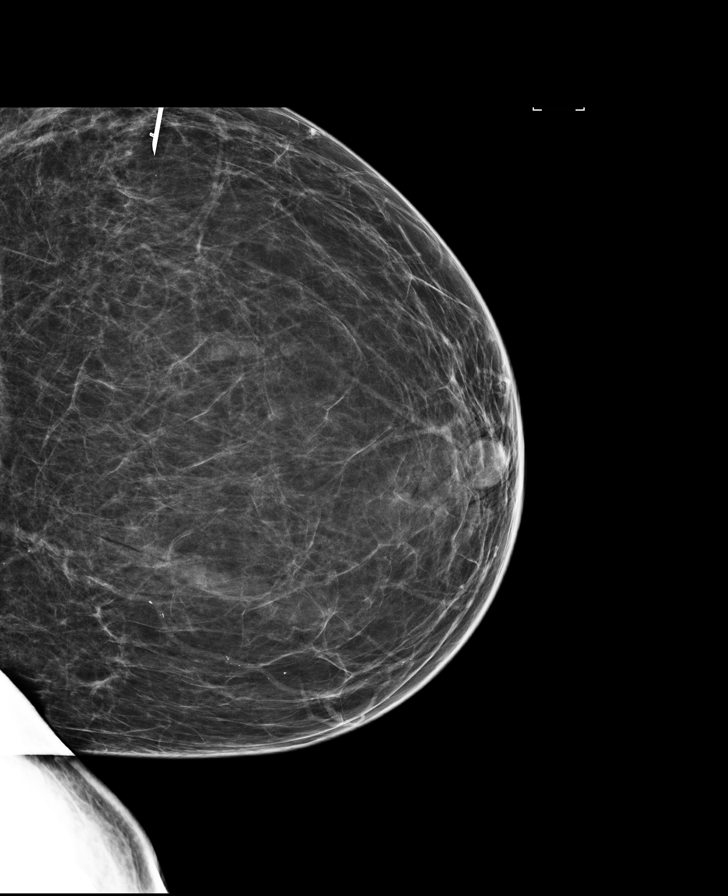

[L LM (1 of 5)]
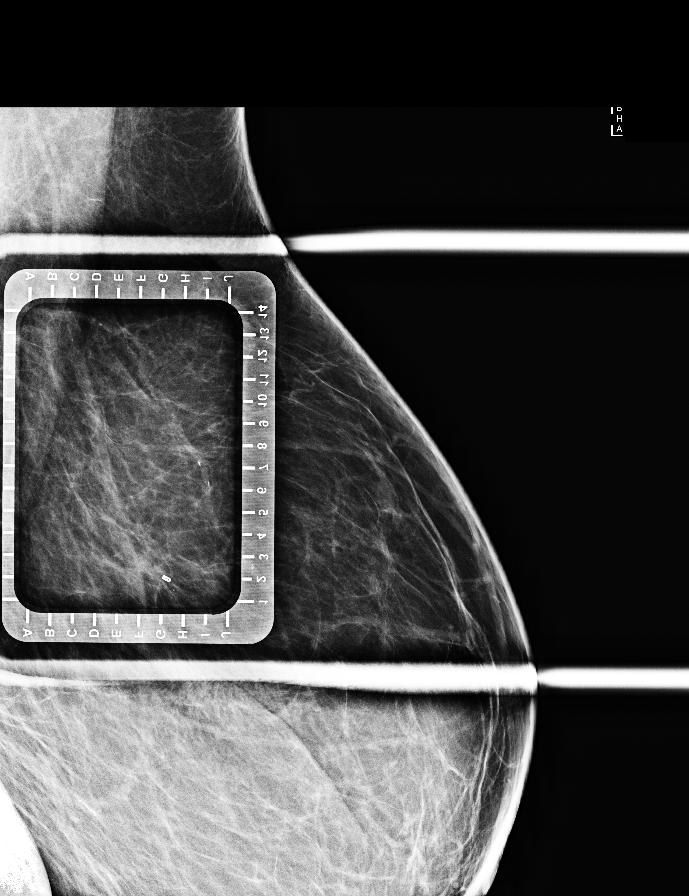

[L LM (2 of 5)]
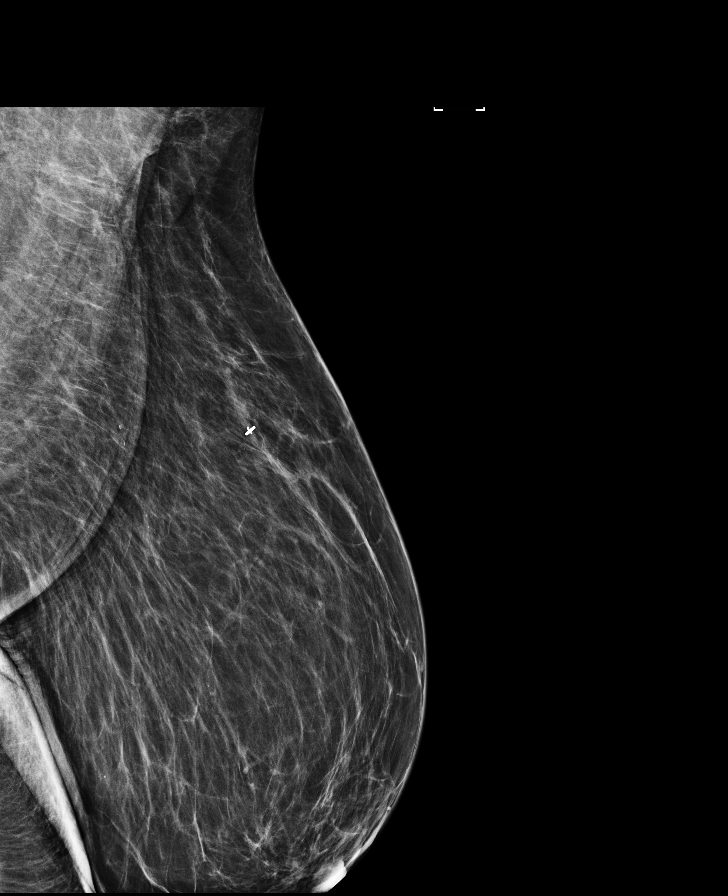

[L CC (2 of 3)]
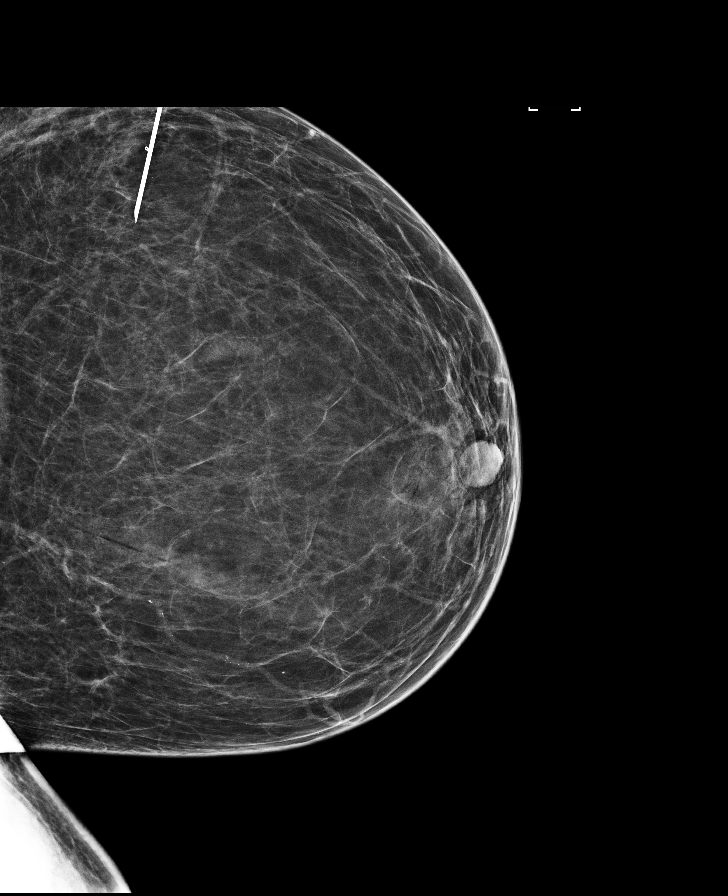

[L LM (3 of 5)]
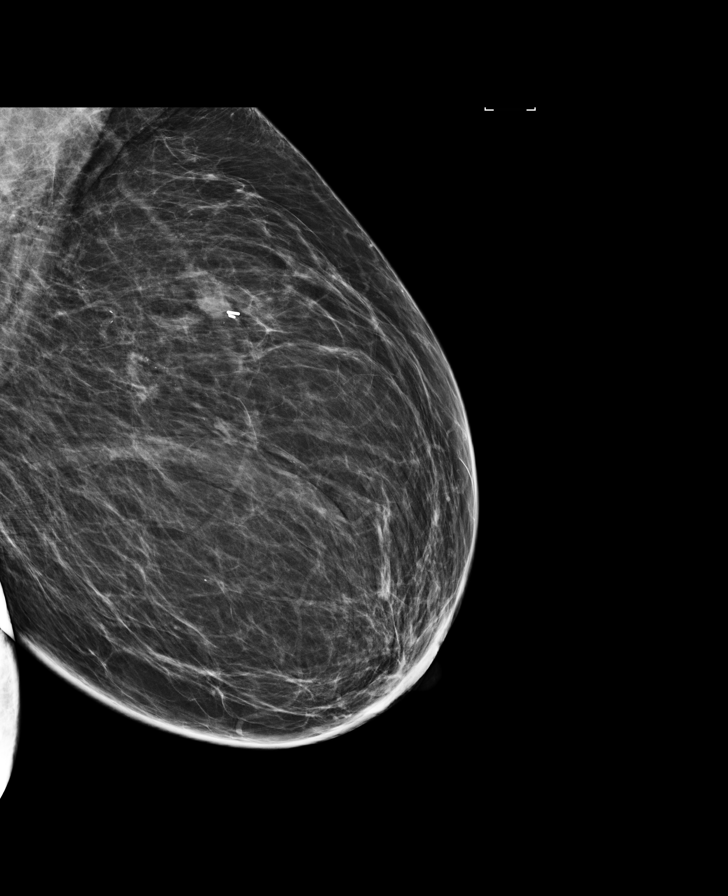

[L LM (4 of 5)]
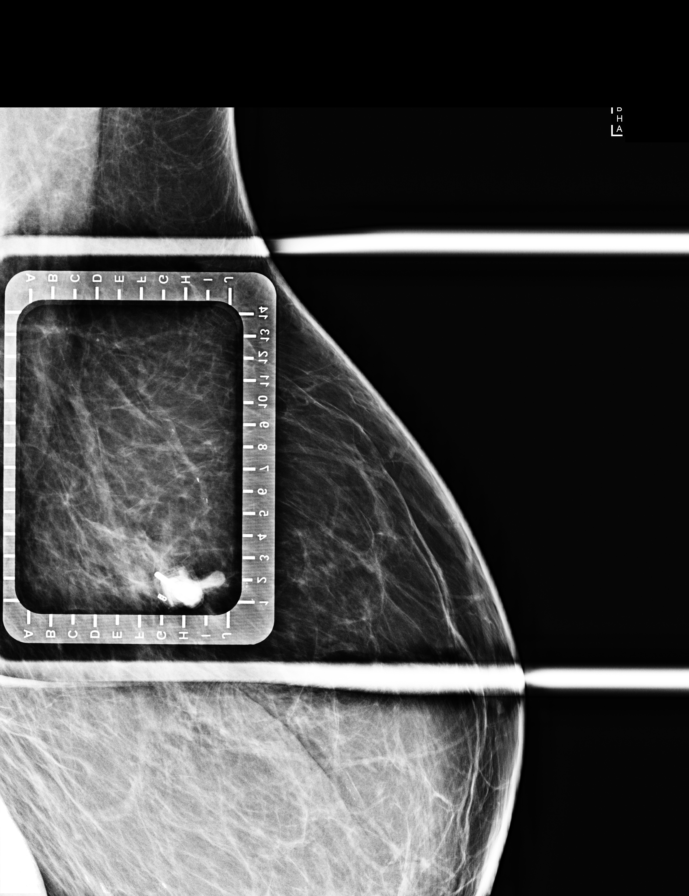

[L CC (3 of 3)]
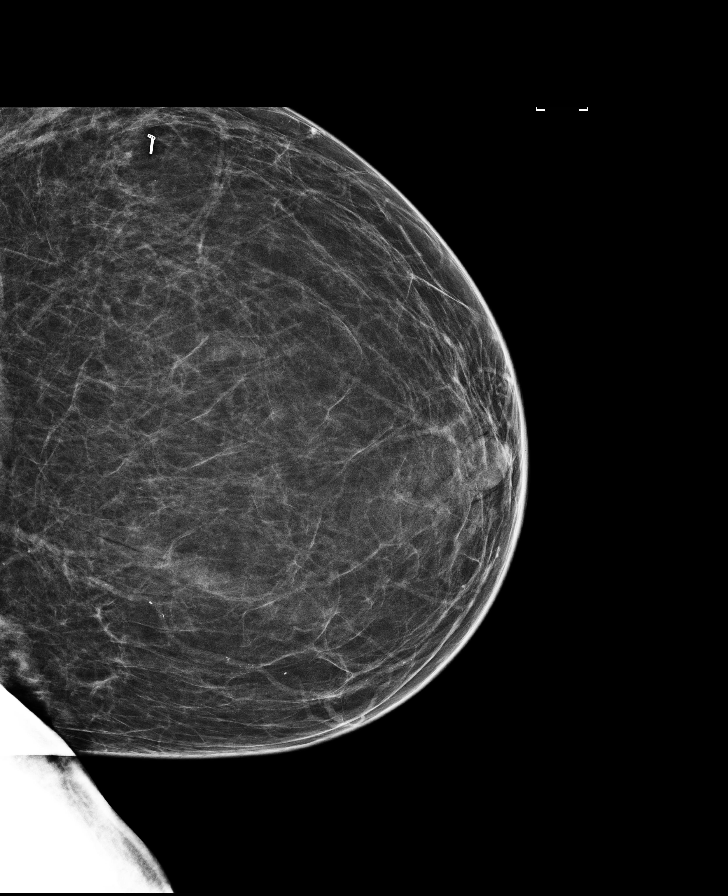

[L LM (5 of 5)]
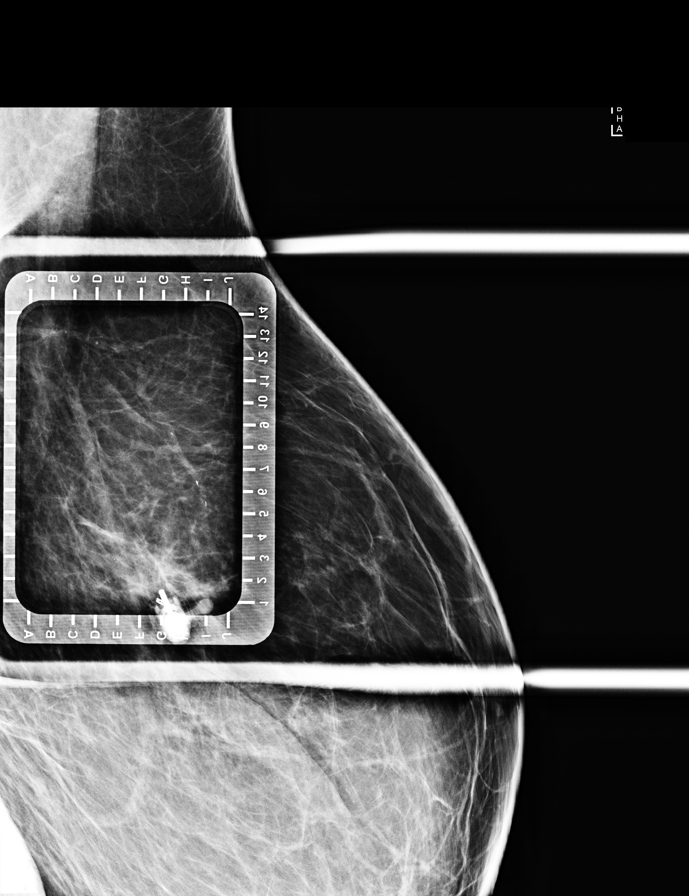

[8 of 8 positions shown; findings below may reference images not displayed]

FINDINGS: Patient presents for radioactive seed localization prior to left
lumpectomy. I met with the patient and we discussed the procedure of
seed localization including benefits and alternatives. We discussed
the high likelihood of successful procedures. We discussed the risks
of the procedures including infection, bleeding, tissue injury and
further surgery. We discussed the low dose of radioactivity involved
in the procedures. Informed, written consent was given.

The usual time-out protocol was performed immediately prior to the
procedures.

SEED 1: METASTATIC LEFT AXILLARY LYMPH NODE CONTAINING A BIOPSY
MARKER CLIP

Using ultrasound guidance, sterile technique, 1% lidocaine and an
[LC] radioactive seed, the previously biopsied metastatic left
axillary lymph node containing a biopsy marker clip was localized
using a caudal approach. The follow-up mammogram images confirm the
seed in the expected location and were marked for Dr. [facility].

Follow-up survey of the patient confirms presence of the radioactive
seed.

Order number of [LC] seed:  [PHONE_NUMBER].

Total activity:  0.244 mCi reference Date: [DATE]

SEED 2: BIOPSY MARKER CLIP IN THE POSTERIOR UPPER OUTER LEFT BREAST

Using mammographic guidance, sterile technique, 1% lidocaine and an
[LC] radioactive seed, the biopsy marker clip in the posterior
upper outer left breast was localized using a lateral approach. The
follow-up mammogram images confirm the seed in the expected location
and were marked for Dr. BARGER.

Follow-up survey of the patient confirms presence of the radioactive
seed.

Order number of [LC] seed:  [PHONE_NUMBER].

Total activity:  0.241 mCi reference Date: [DATE]

The patient tolerated the procedures well and was released from the
[REDACTED]. She was given instructions regarding seed removal.
IMPRESSION: Radioactive seed localization left axilla and left breast. No
apparent complications.

## 2020-11-25 ENCOUNTER — Encounter: Payer: Self-pay | Admitting: Hematology and Oncology

## 2020-11-25 ENCOUNTER — Encounter (HOSPITAL_COMMUNITY)
Admission: RE | Admit: 2020-11-25 | Discharge: 2020-11-25 | Disposition: A | Discharge status: Home / Self Care | Payer: Self-pay | Source: Ambulatory Visit | Attending: General Surgery | Admitting: General Surgery

## 2020-11-25 ENCOUNTER — Encounter (HOSPITAL_COMMUNITY)
Admission: RE | Disposition: A | Discharge status: Home / Self Care | Payer: Self-pay | Source: Ambulatory Visit | Attending: General Surgery

## 2020-11-25 ENCOUNTER — Encounter (HOSPITAL_COMMUNITY): Payer: Self-pay | Admitting: General Surgery

## 2020-11-25 ENCOUNTER — Other Ambulatory Visit (HOSPITAL_COMMUNITY): Payer: Self-pay

## 2020-11-25 ENCOUNTER — Ambulatory Visit (HOSPITAL_COMMUNITY)
Admission: RE | Admit: 2020-11-25 | Discharge: 2020-11-25 | Disposition: A | Discharge status: Home / Self Care | Payer: Self-pay | Source: Ambulatory Visit | Attending: General Surgery | Admitting: General Surgery

## 2020-11-25 ENCOUNTER — Ambulatory Visit (HOSPITAL_COMMUNITY): Payer: Self-pay | Admitting: Vascular Surgery

## 2020-11-25 ENCOUNTER — Ambulatory Visit
Admission: RE | Admit: 2020-11-25 | Discharge: 2020-11-25 | Disposition: A | Discharge status: Home / Self Care | Payer: Self-pay | Source: Ambulatory Visit | Attending: General Surgery | Admitting: General Surgery

## 2020-11-25 ENCOUNTER — Ambulatory Visit (HOSPITAL_COMMUNITY): Payer: Self-pay | Admitting: Anesthesiology

## 2020-11-25 DIAGNOSIS — C50112 Malignant neoplasm of central portion of left female breast: Secondary | ICD-10-CM

## 2020-11-25 DIAGNOSIS — Z170 Estrogen receptor positive status [ER+]: Secondary | ICD-10-CM

## 2020-11-25 HISTORY — PX: NODE DISSECTION: SHX5269

## 2020-11-25 HISTORY — PX: BREAST LUMPECTOMY WITH RADIOACTIVE SEED AND SENTINEL LYMPH NODE BIOPSY: SHX6550

## 2020-11-25 LAB — GLUCOSE, CAPILLARY
Glucose-Capillary: 123 mg/dL — ABNORMAL HIGH (ref 70–99)
Glucose-Capillary: 157 mg/dL — ABNORMAL HIGH (ref 70–99)

## 2020-11-25 IMAGING — MG MM BREAST SURGICAL SPECIMEN
1 series · 1 of 1 positions shown · non-contrast
Comparison: Previous exam(s).

CLINICAL DATA: Left lumpectomy and left axillary lymph node
excision for biopsy-proven invasive ductal carcinoma and metastatic
left axillary lymph node.

EXAM:
SPECIMEN RADIOGRAPH OF THE LEFT AXILLA

[L]
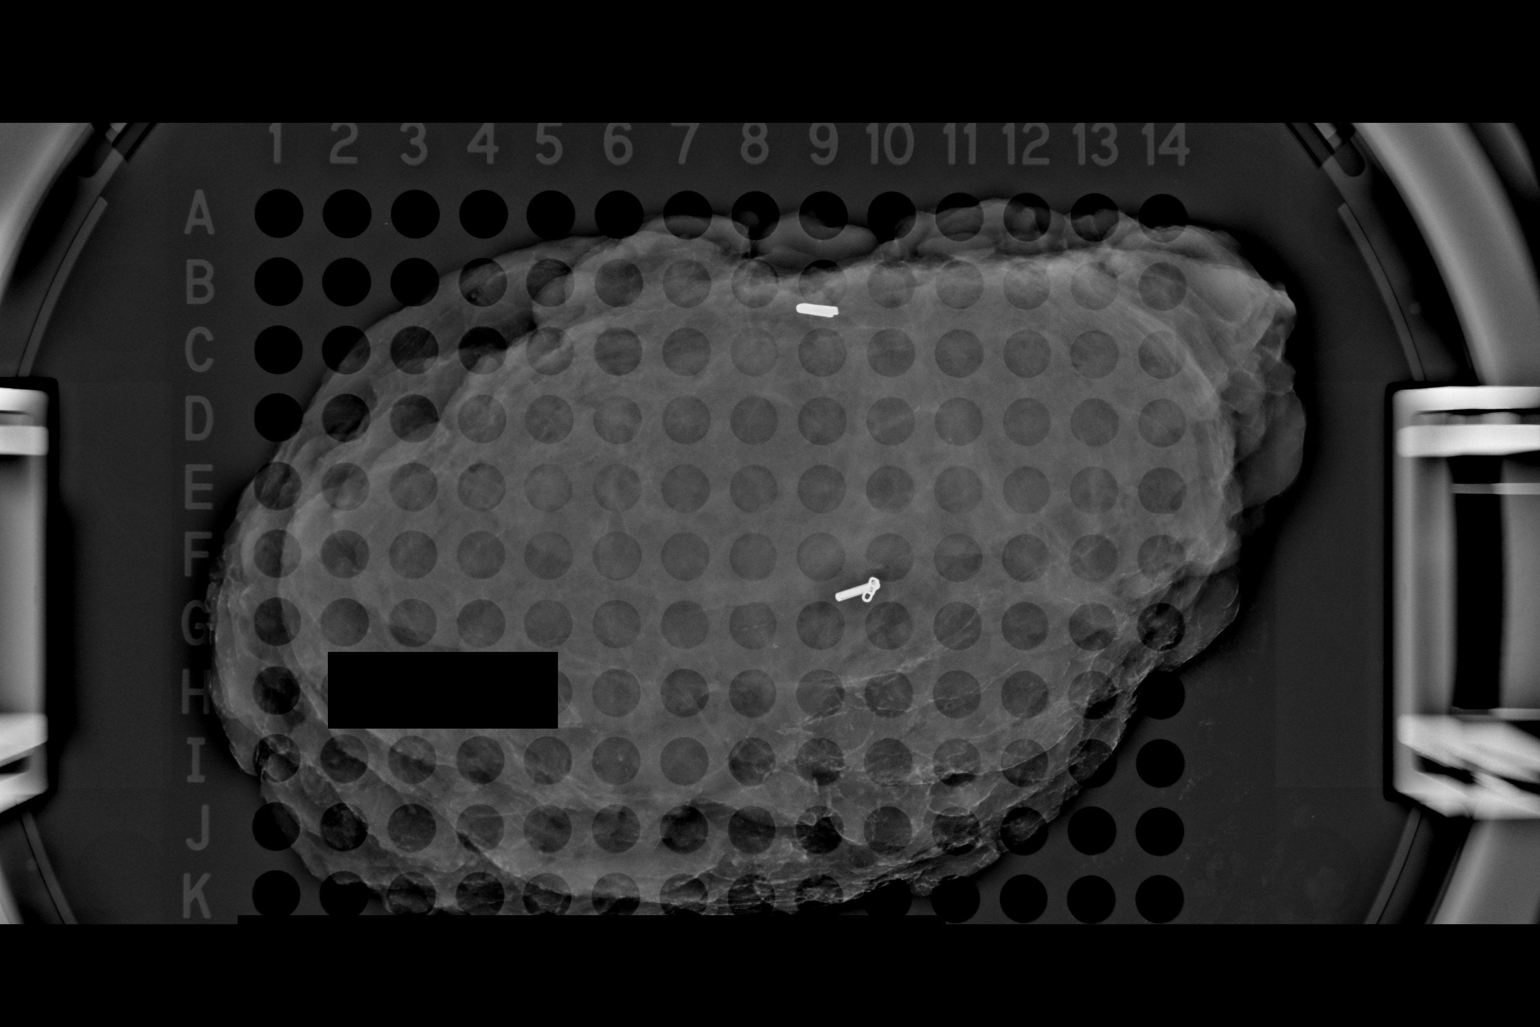

[1 of 1 positions shown; findings below may reference images not displayed]

FINDINGS: Status post excision of the left axilla. The radioactive seed,
biopsy marker clip and 3 surgical clips are present in the specimen
and are intact.
IMPRESSION: Specimen radiograph of the left axilla.

## 2020-11-25 SURGERY — BREAST LUMPECTOMY WITH RADIOACTIVE SEED AND SENTINEL LYMPH NODE BIOPSY
Anesthesia: Regional | Site: Breast | Laterality: Left

## 2020-11-25 MED ORDER — LACTATED RINGERS IV SOLN: INTRAVENOUS | Status: DC

## 2020-11-25 MED ORDER — PROPOFOL 10 MG/ML IV BOLUS
INTRAVENOUS | Status: AC
  Filled 2020-11-25: qty 20

## 2020-11-25 MED ORDER — MIDAZOLAM HCL 2 MG/2ML IJ SOLN
INTRAMUSCULAR | Status: AC
  Administered 2020-11-25: 1 mg via INTRAVENOUS
  Filled 2020-11-25: qty 2

## 2020-11-25 MED ORDER — GABAPENTIN 300 MG PO CAPS: 300.0000 mg | ORAL_CAPSULE | ORAL | Status: AC

## 2020-11-25 MED ORDER — GABAPENTIN 300 MG PO CAPS
ORAL_CAPSULE | ORAL | Status: AC
  Administered 2020-11-25: 300 mg via ORAL
  Filled 2020-11-25: qty 1

## 2020-11-25 MED ORDER — CELECOXIB 200 MG PO CAPS: 200.0000 mg | ORAL_CAPSULE | ORAL | Status: AC

## 2020-11-25 MED ORDER — EPHEDRINE SULFATE-NACL 50-0.9 MG/10ML-% IV SOSY
PREFILLED_SYRINGE | INTRAVENOUS | Status: DC | PRN
  Administered 2020-11-25 (×2): 10 mg via INTRAVENOUS
  Administered 2020-11-25: 15 mg via INTRAVENOUS

## 2020-11-25 MED ORDER — DEXAMETHASONE SODIUM PHOSPHATE 10 MG/ML IJ SOLN
INTRAMUSCULAR | Status: AC
  Filled 2020-11-25: qty 1

## 2020-11-25 MED ORDER — PHENYLEPHRINE 40 MCG/ML (10ML) SYRINGE FOR IV PUSH (FOR BLOOD PRESSURE SUPPORT)
PREFILLED_SYRINGE | INTRAVENOUS | Status: DC | PRN
  Administered 2020-11-25 (×2): 40 ug via INTRAVENOUS
  Administered 2020-11-25: 80 ug via INTRAVENOUS

## 2020-11-25 MED ORDER — 0.9 % SODIUM CHLORIDE (POUR BTL) OPTIME
TOPICAL | Status: DC | PRN
  Administered 2020-11-25: 500 mL

## 2020-11-25 MED ORDER — CELECOXIB 200 MG PO CAPS
ORAL_CAPSULE | ORAL | Status: AC
  Administered 2020-11-25: 200 mg via ORAL
  Filled 2020-11-25: qty 1

## 2020-11-25 MED ORDER — CHLORHEXIDINE GLUCONATE 0.12 % MT SOLN: 15.0000 mL | Freq: Once | OROMUCOSAL | Status: AC

## 2020-11-25 MED ORDER — BUPIVACAINE-EPINEPHRINE (PF) 0.5% -1:200000 IJ SOLN
INTRAMUSCULAR | Status: DC | PRN
  Administered 2020-11-25: 30 mL via PERINEURAL

## 2020-11-25 MED ORDER — PROMETHAZINE HCL 25 MG/ML IJ SOLN: 6.2500 mg | INTRAMUSCULAR | Status: DC | PRN

## 2020-11-25 MED ORDER — TECHNETIUM TC 99M TILMANOCEPT KIT
1.0000 | PACK | Freq: Once | INTRAVENOUS | Status: AC | PRN
  Administered 2020-11-25: 1 via INTRADERMAL

## 2020-11-25 MED ORDER — ORAL CARE MOUTH RINSE: 15.0000 mL | Freq: Once | OROMUCOSAL | Status: AC

## 2020-11-25 MED ORDER — EPHEDRINE 5 MG/ML INJ
INTRAVENOUS | Status: AC
  Filled 2020-11-25: qty 10

## 2020-11-25 MED ORDER — CHLORHEXIDINE GLUCONATE 0.12 % MT SOLN
OROMUCOSAL | Status: AC
  Administered 2020-11-25: 15 mL via OROMUCOSAL
  Filled 2020-11-25: qty 15

## 2020-11-25 MED ORDER — SODIUM CHLORIDE (PF) 0.9 % IJ SOLN
INTRAMUSCULAR | Status: AC
  Filled 2020-11-25: qty 10

## 2020-11-25 MED ORDER — ACETAMINOPHEN 500 MG PO TABS: 1000.0000 mg | ORAL_TABLET | ORAL | Status: AC

## 2020-11-25 MED ORDER — BUPIVACAINE-EPINEPHRINE 0.25% -1:200000 IJ SOLN
INTRAMUSCULAR | Status: DC | PRN
  Administered 2020-11-25: 20 mL

## 2020-11-25 MED ORDER — FENTANYL CITRATE (PF) 100 MCG/2ML IJ SOLN
INTRAMUSCULAR | Status: AC
  Filled 2020-11-25: qty 2

## 2020-11-25 MED ORDER — PROPOFOL 10 MG/ML IV BOLUS
INTRAVENOUS | Status: DC | PRN
  Administered 2020-11-25 (×2): 50 mg via INTRAVENOUS
  Administered 2020-11-25: 200 mg via INTRAVENOUS

## 2020-11-25 MED ORDER — BUPIVACAINE-EPINEPHRINE (PF) 0.25% -1:200000 IJ SOLN
INTRAMUSCULAR | Status: AC
  Filled 2020-11-25: qty 30

## 2020-11-25 MED ORDER — FENTANYL CITRATE (PF) 100 MCG/2ML IJ SOLN
INTRAMUSCULAR | Status: DC | PRN
  Administered 2020-11-25 (×4): 50 ug via INTRAVENOUS

## 2020-11-25 MED ORDER — MIDAZOLAM HCL 2 MG/2ML IJ SOLN
INTRAMUSCULAR | Status: AC
  Filled 2020-11-25: qty 2

## 2020-11-25 MED ORDER — OXYCODONE HCL 5 MG/5ML PO SOLN: 5.0000 mg | Freq: Once | ORAL | Status: DC | PRN

## 2020-11-25 MED ORDER — ONDANSETRON HCL 4 MG/2ML IJ SOLN
INTRAMUSCULAR | Status: AC
  Filled 2020-11-25: qty 2

## 2020-11-25 MED ORDER — FENTANYL CITRATE (PF) 100 MCG/2ML IJ SOLN: 25.0000 ug | INTRAMUSCULAR | Status: DC | PRN

## 2020-11-25 MED ORDER — FENTANYL CITRATE (PF) 250 MCG/5ML IJ SOLN
INTRAMUSCULAR | Status: AC
  Filled 2020-11-25: qty 5

## 2020-11-25 MED ORDER — LIDOCAINE 2% (20 MG/ML) 5 ML SYRINGE
INTRAMUSCULAR | Status: AC
  Filled 2020-11-25: qty 30

## 2020-11-25 MED ORDER — CEFAZOLIN SODIUM-DEXTROSE 2-4 GM/100ML-% IV SOLN
INTRAVENOUS | Status: AC
  Filled 2020-11-25: qty 100

## 2020-11-25 MED ORDER — CHLORHEXIDINE GLUCONATE CLOTH 2 % EX PADS: 6.0000 | MEDICATED_PAD | Freq: Once | CUTANEOUS | Status: DC

## 2020-11-25 MED ORDER — ONDANSETRON HCL 4 MG/2ML IJ SOLN
INTRAMUSCULAR | Status: DC | PRN
  Administered 2020-11-25: 4 mg via INTRAVENOUS

## 2020-11-25 MED ORDER — HYDROCODONE-ACETAMINOPHEN 5-325 MG PO TABS
1.0000 | ORAL_TABLET | Freq: Once | ORAL | Status: AC
Start: 2020-11-25 — End: 2020-11-25
  Administered 2020-11-25: 1 via ORAL

## 2020-11-25 MED ORDER — PHENYLEPHRINE 40 MCG/ML (10ML) SYRINGE FOR IV PUSH (FOR BLOOD PRESSURE SUPPORT)
PREFILLED_SYRINGE | INTRAVENOUS | Status: AC
  Filled 2020-11-25: qty 10

## 2020-11-25 MED ORDER — AMISULPRIDE (ANTIEMETIC) 5 MG/2ML IV SOLN
10.0000 mg | Freq: Once | INTRAVENOUS | Status: AC | PRN
  Administered 2020-11-25: 10 mg via INTRAVENOUS

## 2020-11-25 MED ORDER — SODIUM CHLORIDE (PF) 0.9 % IJ SOLN
INTRAVENOUS | Status: DC | PRN
  Administered 2020-11-25: 5 mL via INTRAMUSCULAR

## 2020-11-25 MED ORDER — HYDROCODONE-ACETAMINOPHEN 5-325 MG PO TABS
1.0000 | ORAL_TABLET | Freq: Four times a day (QID) | ORAL | 0 refills | Status: DC | PRN
  Filled 2020-11-25: qty 15, 2d supply, fill #0

## 2020-11-25 MED ORDER — HYDROCODONE-ACETAMINOPHEN 5-325 MG PO TABS
ORAL_TABLET | ORAL | Status: AC
  Filled 2020-11-25: qty 1

## 2020-11-25 MED ORDER — ACETAMINOPHEN 500 MG PO TABS
ORAL_TABLET | ORAL | Status: AC
  Administered 2020-11-25: 1000 mg via ORAL
  Filled 2020-11-25: qty 2

## 2020-11-25 MED ORDER — DEXAMETHASONE SODIUM PHOSPHATE 10 MG/ML IJ SOLN
INTRAMUSCULAR | Status: DC | PRN
  Administered 2020-11-25: 10 mg via INTRAVENOUS

## 2020-11-25 MED ORDER — MIDAZOLAM HCL 2 MG/2ML IJ SOLN: 1.0000 mg | Freq: Once | INTRAMUSCULAR | Status: AC

## 2020-11-25 MED ORDER — OXYCODONE HCL 5 MG PO TABS: 5.0000 mg | ORAL_TABLET | Freq: Once | ORAL | Status: DC | PRN

## 2020-11-25 MED ORDER — METHYLENE BLUE 0.5 % INJ SOLN
INTRAVENOUS | Status: AC
  Filled 2020-11-25: qty 10

## 2020-11-25 MED ORDER — CEFAZOLIN SODIUM-DEXTROSE 2-4 GM/100ML-% IV SOLN
2.0000 g | INTRAVENOUS | Status: AC
  Administered 2020-11-25: 2 g via INTRAVENOUS

## 2020-11-25 SURGICAL SUPPLY — 38 items
APPLIER CLIP 9.375 MED OPEN (MISCELLANEOUS) ×6
BAG COUNTER SPONGE SURGICOUNT (BAG) IMPLANT
BAG SURGICOUNT SPONGE COUNTING (BAG)
BINDER BREAST LRG (GAUZE/BANDAGES/DRESSINGS) IMPLANT
BINDER BREAST XLRG (GAUZE/BANDAGES/DRESSINGS) IMPLANT
CANISTER SUCT 3000ML PPV (MISCELLANEOUS) ×3 IMPLANT
CHLORAPREP W/TINT 26 (MISCELLANEOUS) ×3 IMPLANT
CLIP APPLIE 9.375 MED OPEN (MISCELLANEOUS) ×2 IMPLANT
CNTNR URN SCR LID CUP LEK RST (MISCELLANEOUS) IMPLANT
CONT SPEC 4OZ STRL OR WHT (MISCELLANEOUS)
COVER PROBE W GEL 5X96 (DRAPES) ×3 IMPLANT
COVER SURGICAL LIGHT HANDLE (MISCELLANEOUS) ×3 IMPLANT
DERMABOND ADVANCED (GAUZE/BANDAGES/DRESSINGS) ×2
DERMABOND ADVANCED .7 DNX12 (GAUZE/BANDAGES/DRESSINGS) ×1 IMPLANT
DEVICE DUBIN SPECIMEN MAMMOGRA (MISCELLANEOUS) ×3 IMPLANT
DRAPE CHEST BREAST 15X10 FENES (DRAPES) ×3 IMPLANT
ELECT COATED BLADE 2.86 ST (ELECTRODE) ×3 IMPLANT
ELECT REM PT RETURN 9FT ADLT (ELECTROSURGICAL) ×3
ELECTRODE REM PT RTRN 9FT ADLT (ELECTROSURGICAL) ×1 IMPLANT
GLOVE SURG ENC MOIS LTX SZ7.5 (GLOVE) ×3 IMPLANT
GLOVE SURG UNDER POLY LF SZ6.5 (GLOVE) ×3 IMPLANT
GLOVE SURG UNDER POLY LF SZ7 (GLOVE) ×3 IMPLANT
GOWN STRL REUS W/ TWL LRG LVL3 (GOWN DISPOSABLE) ×2 IMPLANT
GOWN STRL REUS W/TWL LRG LVL3 (GOWN DISPOSABLE) ×4
KIT BASIN OR (CUSTOM PROCEDURE TRAY) ×3 IMPLANT
KIT MARKER MARGIN INK (KITS) ×3 IMPLANT
LIGHT WAVEGUIDE WIDE FLAT (MISCELLANEOUS) IMPLANT
NEEDLE 18GX1X1/2 (RX/OR ONLY) (NEEDLE) IMPLANT
NEEDLE FILTER BLUNT 18X 1/2SAF (NEEDLE) ×2
NEEDLE FILTER BLUNT 18X1 1/2 (NEEDLE) ×1 IMPLANT
NEEDLE HYPO 25GX1X1/2 BEV (NEEDLE) ×6 IMPLANT
NS IRRIG 1000ML POUR BTL (IV SOLUTION) ×3 IMPLANT
PACK GENERAL/GYN (CUSTOM PROCEDURE TRAY) ×3 IMPLANT
SUT MNCRL AB 4-0 PS2 18 (SUTURE) ×6 IMPLANT
SUT VIC AB 3-0 SH 18 (SUTURE) ×3 IMPLANT
SYR CONTROL 10ML LL (SYRINGE) ×6 IMPLANT
TOWEL GREEN STERILE (TOWEL DISPOSABLE) ×3 IMPLANT
TOWEL GREEN STERILE FF (TOWEL DISPOSABLE) ×3 IMPLANT

## 2020-11-25 NOTE — H&P (Signed)
Courtney Coleman  Location: [facility] Patient #: [id number] DOB: [birth year] Married / Language: English / Race: Undefined Female   History of Present Illness  The patient is a 55 year old female who presents with breast cancer. We are asked to see the patient in consultation by Dr. Lindi Adie to evaluate her for a new left breast cancer.  The patient is a 55 year old female who was recently found to have a 6 cm cancer in the upper outer quadrant of the left breast with 2 positive nodes.  She was treated with neoadjuvant chemotherapy and has had some significant response.  Her cancer now measures about 3 cm and the nodes have reduced in size.  She had her last dose of chemotherapy about 5 days ago.  She is now ready to schedule definitive surgery.  She does have a history of stroke and congestive heart failure.  Her heart doctor is in [place].   Allergies  No Known Drug Allergies    Medication History  Amiodarone HCl  (200MG  Tablet, Oral) Active. amLODIPine Besylate  (5MG  Tablet, Oral) Active. Anastrozole  (1MG  Tablet, Oral) Active. Atorvastatin Calcium  (40MG  Tablet, Oral) Active. Dexamethasone  (4MG  Tablet, Oral) Active. Eliquis  (5MG  Tablet, Oral) Active. Entresto  (49-51MG  Tablet, Oral) Active. hydrALAZINE HCl  (10MG  Tablet, Oral) Active. Loperamide HCl  (2MG  Capsule, Oral) Active. Ondansetron HCl  (8MG  Tablet, Oral) Active. Polyethylene Glycol 3350  (17GM/SCOOP Powder, Oral) Active. Prochlorperazine Maleate  (10MG  Tablet, Oral) Active. Medications Reconciled   Other Problems  High blood pressure      Review of Systems  General Not Present- Appetite Loss, Chills, Fatigue, Fever, Night Sweats, Weight Gain and Weight Loss. Note:  All other systems negative (unless as noted in HPI & included Review of Systems) Skin Not Present- Change in Wart/Mole, Dryness, Hives, Jaundice, New Lesions, Non-Healing Wounds, Rash and Ulcer. HEENT Not Present- Earache, Hearing Loss,  Hoarseness, Nose Bleed, Oral Ulcers, Ringing in the Ears, Seasonal Allergies, Sinus Pain, Sore Throat, Visual Disturbances, Wears glasses/contact lenses and Yellow Eyes. Respiratory Not Present- Bloody sputum, Chronic Cough, Difficulty Breathing, Snoring and Wheezing. Breast Not Present- Breast Mass, Breast Pain, Nipple Discharge and Skin Changes. Cardiovascular Not Present- Chest Pain, Difficulty Breathing Lying Down, Leg Cramps, Palpitations, Rapid Heart Rate, Shortness of Breath and Swelling of Extremities. Gastrointestinal Not Present- Abdominal Pain, Bloating, Bloody Stool, Change in Bowel Habits, Chronic diarrhea, Constipation, Difficulty Swallowing, Excessive gas, Gets full quickly at meals, Hemorrhoids, Indigestion, Nausea, Rectal Pain and Vomiting. Female Genitourinary Not Present- Frequency, Nocturia, Painful Urination, Pelvic Pain and Urgency. Musculoskeletal Not Present- Back Pain, Joint Pain, Joint Stiffness, Muscle Pain, Muscle Weakness and Swelling of Extremities. Neurological Not Present- Decreased Memory, Fainting, Headaches, Numbness, Seizures, Tingling, Tremor, Trouble walking and Weakness. Psychiatric Not Present- Anxiety, Bipolar, Change in Sleep Pattern, Depression, Fearful and Frequent crying. Endocrine Not Present- Cold Intolerance, Excessive Hunger, Hair Changes, Heat Intolerance, Hot flashes and New Diabetes. Hematology Not Present- Easy Bruising, Excessive bleeding, Gland problems, HIV and Persistent Infections.  Vitals  Weight: 223.38 lb   Height: 64 in  Body Surface Area: 2.05 m   Body Mass Index: 38.34 kg/m   Temp.: 97.3 F    Pulse: 95 (Regular)    P.OX: 97% (Room air) BP: 160/96(Sitting, Left Arm, Standard)       Physical Exam  General Mental Status - Alert. General Appearance - Consistent with stated age. Hydration - Well hydrated. Voice - Normal.  Head and Neck Head - normocephalic, atraumatic with no lesions  or palpable masses. Trachea -  midline. Thyroid Gland Characteristics - normal size and consistency.  Eye Eyeball - Bilateral - Extraocular movements intact. Sclera/Conjunctiva - Bilateral - No scleral icterus.  Chest and Lung Exam Chest and lung exam reveals  - quiet, even and easy respiratory effort with no use of accessory muscles and on auscultation, normal breath sounds, no adventitious sounds and normal vocal resonance. Inspection Chest Wall - Normal. Back - normal.  Breast Note:  There is no palpable mass in either breast. There is no palpable axillary, supraclavicular, or cervical lymphadenopathy. She does have a rash with some broken skin in the inframammary fold of both breasts   Cardiovascular Cardiovascular examination reveals  - normal heart sounds, regular rate and rhythm with no murmurs and normal pedal pulses bilaterally.  Abdomen Inspection Inspection of the abdomen reveals - No Hernias. Skin - Scar - no surgical scars. Palpation/Percussion Palpation and Percussion of the abdomen reveal - Soft, Non Tender, No Rebound tenderness, No Rigidity (guarding) and No hepatosplenomegaly. Auscultation Auscultation of the abdomen reveals - Bowel sounds normal.  Neurologic Neurologic evaluation reveals  - alert and oriented x 3 with no impairment of recent or remote memory. Mental Status - Normal.  Musculoskeletal Normal Exam - Left - Upper Extremity Strength Normal and Lower Extremity Strength Normal. Normal Exam - Right - Upper Extremity Strength Normal and Lower Extremity Strength Normal.  Lymphatic Head & Neck  General Head & Neck Lymphatics: Bilateral - Description - Normal. Axillary  General Axillary Region: Bilateral - Description - Normal. Tenderness - Non Tender. Femoral & Inguinal  Generalized Femoral & Inguinal Lymphatics: Bilateral - Description - Normal. Tenderness - Non Tender.    Assessment & Plan MALIGNANT NEOPLASM OF UPPER-OUTER QUADRANT OF LEFT BREAST IN FEMALE, ESTROGEN  RECEPTOR POSITIVE (C50.412) Impression: The patient appears to have a 6 cm cancer in the upper outer quadrant of the left breast with 2 abnormal lymph nodes one of which was positive. She has been receiving neoadjuvant chemotherapy and seems to have tolerated it fairly well. The cancer has shrunk to about 3 cm and the nodes have reduced in size as well. At this point I think she would be a candidate for breast conservation with sentinel node mapping and targeted node dissection. I have discussed with her the different options and this seems to be what she would prefer. She understands that there is a higher chance of having positive margins and potentially needing a node dissection down the road depending on the pathologic findings. I have discussed with her in detail the risks and benefits of the operation as well as some of the technical aspects including the use of a radioactive seed for localization and she understands and wishes to proceed. She also has a history of congestive heart failure and stroke so she will need cardiac clearance prior to scheduling. She also has a rash on the underside of both breasts. I would like her to try Lamisil or Lotrimin cream with soft gauze as a barrier to see if this will resolve prior to surgery. This patient encounter took 60 minutes today to perform the following: take history, perform exam, review outside records, interpret imaging, counsel the patient on their diagnosis and document encounter, findings & plan in the EHR

## 2020-11-25 NOTE — Op Note (Signed)
11/25/2020  10:41 AM  PATIENT:  Courtney Coleman  55 y.o. female  PRE-OPERATIVE DIAGNOSIS:  LEFT BREAST CANCER  POST-OPERATIVE DIAGNOSIS:  LEFT BREAST CANCER  PROCEDURE:  Procedure(s): LEFT BREAST LUMPECTOMY WITH RADIOACTIVE SEED LOCALIZATION AND DEEP LEFT AXILLARY SENTINEL LYMPH NODE BIOPSY (Left) TARGETED NODE DISSECTION (Left)  SURGEON:  Surgeon(s) and Role:    * Jovita Kussmaul, MD - Primary  PHYSICIAN ASSISTANT:   ASSISTANTS: none   ANESTHESIA:   local and general  EBL:  5 mL   BLOOD ADMINISTERED:none  DRAINS: none   LOCAL MEDICATIONS USED:  MARCAINE     SPECIMEN:  Source of Specimen:  left breast tissue and sentinel node biopsy and targeted node dissection  DISPOSITION OF SPECIMEN:  PATHOLOGY  COUNTS:  YES  TOURNIQUET:  * No tourniquets in log *  DICTATION: .Dragon Dictation  After informed consent was obtained the patient was brought to the operating room and placed in the supine position on the operating table.  After adequate induction of general anesthesia the patient's left chest, breast, and axillary area were prepped with ChloraPrep, allowed to dry, and draped in usual sterile manner.  An appropriate timeout was performed.  4 cc of methylene blue and 1 cc of saline were injected at 4 equidistant points around the areola of the left breast.  Previously an I-125 seed was placed in the upper outer quadrant of the left breast to mark an area of invasive breast cancer and another I-125 seed was placed in the left axilla to mark previously positive lymph node.  Earlier in the day the patient also underwent injection 1 mCi of technetium sulfur colloid in the subareolar position on the left.  The neoprobe was initially set to I-125 in th seed and the lymph node was identified.  The area overlying this was infiltrated with quarter percent Marcaine.  A small transversely oriented incision was made in the axilla overlying the area of radioactivity with a 15 blade knife.  The  incision was carried through the skin and subcutaneous tissue sharply with the electrocautery until the deep left axillary space was entered.  The neoprobe was then used to identify the lymph node with the radioactive seed.  This lymph node was excised sharply with the electrocautery and the surrounding small vessels and lymphatics were controlled with clips.  This node was sent as targeted node and a specimen radiograph confirmed the presence of the seed.  The neoprobe was then set to technetium and an area of radioactivity was also identified in the left axilla.  The neoprobe was used to direct blunt hemostat dissection.  I was able to identify a hot lymph node.  This lymph node was excised sharply with the electrocautery and the surrounding small vessels and lymphatics were controlled with clips.  Ex vivo counts on this node were approximately 50.  No other hot, blue, or palpable lymph nodes were identified in the left axilla.  The deep layer of the wound was then closed with interrupted 3-0 Vicryl stitches.  The skin was closed with a running 4-0 Monocryl subcuticular stitch.  Attention was then turned to the left breast.  The neoprobe was set to I-125 in the area of the radioactive seed was readily identified.  An elliptical incision was made in the skin overlying the area of radioactivity with a 15 blade knife.  The incision was carried through the skin and subcutaneous tissue sharply with the electrocautery.  Dissection was then carried widely around the radioactive seed  while checking the area of radioactivity frequently.  This dissection was carried all the way to the chest wall.  Once the specimen was removed it was oriented with the appropriate paint colors.  A specimen radiograph was obtained that showed the clip and seed to be near the center of the specimen.  The specimen was then sent to pathology for further evaluation.  Hemostasis was achieved using the Bovie electrocautery.  The wound was marked  with clips.  The wound was irrigated with saline and infiltrated with more quarter percent Marcaine.  The deep layer of the wound was then closed with layers of interrupted 3-0 Vicryl stitches.  The skin was then closed with a running 4-0 Monocryl subcuticular stitch.  Dermabond dressings were applied.  The patient tolerated the procedure well.  At the end of the case all needle sponge and instrument counts were correct.  The patient was then awakened and taken recovery in stable condition.  PLAN OF CARE: Discharge to home after PACU  PATIENT DISPOSITION:  PACU - hemodynamically stable.   Delay start of Pharmacological VTE agent (>24hrs) due to surgical blood loss or risk of bleeding: not applicable

## 2020-11-25 NOTE — Interval H&P Note (Signed)
History and Physical Interval Note:  11/25/2020 7:57 AM  Courtney Coleman  has presented today for surgery, with the diagnosis of LEFT BREAST CANCER.  The various methods of treatment have been discussed with the patient and family. After consideration of risks, benefits and other options for treatment, the patient has consented to  Procedure(s): LEFT BREAST LUMPECTOMY WITH RADIOACTIVE SEED AND SENTINEL LYMPH NODE BIOPSY (Left) TARGETED NODE DISSECTION (Left) as a surgical intervention.  The patient's history has been reviewed, patient examined, no change in status, stable for surgery.  I have reviewed the patient's chart and labs.  Questions were answered to the patient's satisfaction.     Autumn Messing III

## 2020-11-25 NOTE — Transfer of Care (Signed)
Immediate Anesthesia Transfer of Care Note  Patient: Courtney Coleman  Procedure(s) Performed: LEFT BREAST LUMPECTOMY WITH RADIOACTIVE SEED AND SENTINEL LYMPH NODE BIOPSY (Left: Breast) TARGETED NODE DISSECTION (Left: Breast)  Patient Location: PACU  Anesthesia Type:General and GA combined with regional for post-op pain  Level of Consciousness: drowsy  Airway & Oxygen Therapy: Patient Spontanous Breathing and Patient connected to face mask oxygen  Post-op Assessment: Report given to RN and Post -op Vital signs reviewed and stable  Post vital signs: Reviewed and stable  Last Vitals:  Vitals Value Taken Time  BP    Temp    Pulse    Resp    SpO2      Last Pain:  Vitals:   11/25/20 0709  TempSrc:   PainSc: 0-No pain      Patients Stated Pain Goal: 3 (69/67/89 3810)  Complications: No notable events documented.

## 2020-11-25 NOTE — Anesthesia Postprocedure Evaluation (Signed)
Anesthesia Post Note  Patient: Patent examiner  Procedure(s) Performed: LEFT BREAST LUMPECTOMY WITH RADIOACTIVE SEED AND SENTINEL LYMPH NODE BIOPSY (Left: Breast) TARGETED NODE DISSECTION (Left: Breast)     Patient location during evaluation: PACU Anesthesia Type: Regional and General Level of consciousness: awake Pain management: pain level controlled Vital Signs Assessment: post-procedure vital signs reviewed and stable Respiratory status: spontaneous breathing, nonlabored ventilation, respiratory function stable and patient connected to nasal cannula oxygen Cardiovascular status: blood pressure returned to baseline and stable Postop Assessment: no apparent nausea or vomiting Anesthetic complications: no   No notable events documented.  Last Vitals:  Vitals:   11/25/20 1155 11/25/20 1210  BP: (!) 156/81 (!) 154/81  Pulse: 79 79  Resp: (!) 3 14  Temp: 36.5 C 36.5 C  SpO2: 98% 96%    Last Pain:  Vitals:   11/25/20 1210  TempSrc:   PainSc: 0-No pain                 Soledad Budreau P Taiyana Kissler

## 2020-11-25 NOTE — Anesthesia Procedure Notes (Addendum)
Anesthesia Regional Block: Pectoralis block   Pre-Anesthetic Checklist: , timeout performed,  Correct Patient, Correct Site, Correct Laterality,  Correct Procedure,, site marked,  Risks and benefits discussed,  Surgical consent,  Pre-op evaluation,  At surgeon's request and post-op pain management  Laterality: Left  Prep: chloraprep       Needles:  Injection technique: Single-shot  Needle Type: Echogenic Stimulator Needle     Needle Length: 10cm  Needle Gauge: 20     Additional Needles:   Procedures:,,,, ultrasound used (permanent image in chart),,    Narrative:  Start time: 11/25/2020 8:35 AM End time: 11/25/2020 8:45 AM Injection made incrementally with aspirations every 5 mL.  Performed by: Personally  Anesthesiologist: Murvin Natal, MD  Additional Notes: Functioning IV was confirmed and monitors were applied.  A 165mm 20ga BBraun echogenic stimulator needle was used. Sterile prep, hand hygiene and sterile gloves were used.  Negative aspiration and negative test dose prior to incremental administration of local anesthetic. The patient tolerated the procedure well.

## 2020-11-25 NOTE — Anesthesia Procedure Notes (Signed)
Procedure Name: LMA Insertion Date/Time: 11/25/2020 9:17 AM Performed by: Ezequiel Kayser, CRNA Pre-anesthesia Checklist: Patient identified, Emergency Drugs available, Suction available and Patient being monitored Patient Re-evaluated:Patient Re-evaluated prior to induction Oxygen Delivery Method: Circle System Utilized Preoxygenation: Pre-oxygenation with 100% oxygen Induction Type: IV induction Ventilation: Mask ventilation without difficulty LMA: LMA inserted LMA Size: 4.0 Number of attempts: 1 Airway Equipment and Method: Bite block Placement Confirmation: positive ETCO2 Tube secured with: Tape Dental Injury: Teeth and Oropharynx as per pre-operative assessment

## 2020-11-26 ENCOUNTER — Other Ambulatory Visit (HOSPITAL_COMMUNITY): Payer: Self-pay

## 2020-11-26 ENCOUNTER — Ambulatory Visit (INDEPENDENT_AMBULATORY_CARE_PROVIDER_SITE_OTHER): Payer: Self-pay | Admitting: Primary Care

## 2020-11-26 ENCOUNTER — Encounter (HOSPITAL_COMMUNITY): Payer: Self-pay | Admitting: General Surgery

## 2020-11-27 LAB — SURGICAL PATHOLOGY

## 2020-12-01 ENCOUNTER — Encounter: Payer: Self-pay | Admitting: *Deleted

## 2020-12-01 DIAGNOSIS — Z170 Estrogen receptor positive status [ER+]: Secondary | ICD-10-CM

## 2020-12-01 NOTE — Progress Notes (Signed)
Patient Care Team: Kerin Perna, NP as PCP - General (Internal Medicine) Revankar, Reita Cliche, MD as PCP - Cardiology (Cardiology) Mauro Kaufmann, RN as Oncology Nurse Navigator Rockwell Germany, RN as Oncology Nurse Navigator Nicholas Lose, MD as Consulting Physician (Hematology and Oncology) Revankar, Reita Cliche, MD as Consulting Physician (Cardiology) Jovita Kussmaul, MD as Consulting Physician (General Surgery)  DIAGNOSIS:    ICD-10-CM   1. Malignant neoplasm of central portion of left breast in female, estrogen receptor positive ([place])  C50.112    Z17.0       SUMMARY OF ONCOLOGIC HISTORY: Oncology History  Malignant neoplasm of central portion of left breast in female, estrogen receptor positive ([place])   Initial Diagnosis   Palpable left breast mass started February 2021 (patient lives in the [place]), went to [place] and mammogram and ultrasound 01/03/2020: 8 cm distortion and calcifications left breast 2:00 and 3 abnormal axillary lymph nodes.  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 with above-mentioned history of left breast cancer currently on neoadjuvant chemotherapy with [place]. She underwent a left breast lumpectomy on 11/25/20 for which the pathology showed focal fibrosis with mild inflammation and calcifications, no residual carcinoma, margins negative for carcinoma,  and 3 axillary nodes negative for metastatic carcinoma. She reports to the clinic today for follow-up and to review the pathology report.   ALLERGIES:  has No Known Allergies.  MEDICATIONS:  Current Outpatient Medications  Medication Sig Dispense Refill   acetaminophen (TYLENOL) 500 MG tablet Take 1,000 mg by mouth every 6 (six) hours as needed for mild pain.     amiodarone (PACERONE) 200 MG tablet Take 1/2 tablet (100 mg total) by mouth daily. 15 tablet 2   amLODipine (NORVASC) 5 MG tablet Take 1 and 1/2 tablets (7.5 mg total) by mouth daily. 45 tablet 2   anastrozole (ARIMIDEX) 1 MG tablet Take 1 tablet (1 mg total) by mouth daily. 30 tablet 2   apixaban (ELIQUIS) 5 MG TABS tablet Take 1 tablet (5 mg total) by mouth 2 (two) times daily. 60 tablet 2   atorvastatin (LIPITOR) 40 MG tablet Take 1 tablet (40 mg total) by mouth daily. 30 tablet 2   hydrALAZINE (APRESOLINE) 10 MG tablet Take 1 tablet (10 mg total) by mouth every 8 (eight) hours. 90 tablet 2   HYDROcodone-acetaminophen (NORCO/VICODIN) 5-325 MG tablet Take 1-2 tablets by mouth every 6 (six) hours as needed for moderate pain or severe pain. 15 tablet 0   No current facility-administered medications for this visit.    PHYSICAL EXAMINATION: ECOG PERFORMANCE STATUS: 1 - Symptomatic but completely ambulatory  Vitals:   12/02/20 0858  BP: (!) 163/73  Pulse: 66  Resp: 16  Temp: 97.7 F (36.5 C)  SpO2: 100%   Filed Weights   12/02/20 0858  Weight: 224 lb 3.2 oz (101.7 kg)     LABORATORY DATA:  I have reviewed the data as listed CMP Latest Ref Rng & Units 11/19/2020 10/09/2020 08/26/2020  Glucose 70 - 99 mg/dL 112(H) 110(H) 114(H)  BUN 6 - 20 mg/dL 12 6 9   Creatinine 0.44 - 1.00 mg/dL 0.70 0.62 0.64  Sodium 135 - 145 mmol/L 141 140 141  Potassium 3.5 - 5.1 mmol/L 3.4(L) 3.4(L) 3.6  Chloride 98 - 111 mmol/L 105 104 104  CO2 22 - 32 mmol/L 27 27 26   Calcium 8.9 - 10.3 mg/dL 9.1 8.9 8.4(L)  Total Protein 6.5 - 8.1 g/dL 7.7  7.4 6.8  Total Bilirubin 0.3 - 1.2 mg/dL 1.0 0.7 0.7  Alkaline Phos 38 - 126 U/L 85 84 67  AST 15 - 41 U/L 15 19 19   ALT 0 - 44 U/L 10 9 12     Lab Results  Component Value Date   WBC 6.7 11/19/2020   HGB 11.3 (L) 11/19/2020   HCT 34.8 (L) 11/19/2020   MCV 95.9 11/19/2020   PLT 218 11/19/2020   NEUTROABS 3.3 11/19/2020    ASSESSMENT & PLAN:  Malignant neoplasm of central portion of left breast in female, estrogen receptor positive ([place]) Palpable left breast mass started February 2021 (patient lives in the [place]), went to [place] and mammogram and ultrasound 01/03/2020: 8 cm distortion and calcifications left breast 2:00 and 3 abnormal axillary lymph nodes.  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   Treatment Plan:: 1. Neoadjuvant chemotherapy with TCH Perjeta 6 cycles followed by Herceptin Perjeta versus Kadcyla maintenance for 1 year 2. 11/25/2020 lumpectomy with the targeted node dissection: Pathologic complete response 0/3 lymph nodes negative 3. Followed by adjuvant radiation therapy  4.  Followed by antiestrogen therapy and neratinib -------------------------------------------------------------------------------------------------------------------------------- Current Treatment: Herceptin Perjeta Maintenance Left Lumpectomy 11/25/2020: complete path response' 0/3 LN  Chemotherapy-induced anemia: Hemoglobin has improved to 11.3. Continue with HP maintenance Radiation consult  Patient and her husband were ecstatic hearing the results of surgery Return to clinic every 3 weeks for Herceptin Perjeta and every 6 weeks with follow-up with me.   No orders of the defined types were placed in this encounter.  The patient has a good understanding of the overall plan. she agrees with it. she will call with any problems that may develop before the next visit here.  Total time spent: 30 mins including face to face time and time spent for planning,  charting and coordination of care  Rulon Eisenmenger, MD, MPH 12/02/2020  I, Thana Ates, am acting as scribe for Dr. Nicholas Lose.  I have reviewed the above documentation for accuracy and completeness, and I agree with the above.

## 2020-12-01 NOTE — Assessment & Plan Note (Signed)
Palpable left breast mass started February 2021 (patient lives in the [place]), went to [place] and mammogram and ultrasound 01/03/2020: 8 cm distortion and calcifications left breast 2:00 and 3 abnormal axillary lymph nodes. 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  Treatment Plan:: 1. Neoadjuvant chemotherapy with [place] Perjeta 6 cycles followed by HerceptinPerjeta versus Kadcylamaintenance for 1 year 2. Followed bymastectomy with the targeted node dissection 3. Followed by adjuvant radiation therapy 4.Followed by antiestrogen therapy and neratinib -------------------------------------------------------------------------------------------------------------------------------- Current Treatment: Herceptin Perjeta Maintenance Left Lumpectomy 11/25/2020: complete path response' 0/3 LN  Chemotherapy-induced anemia: Hemoglobin has improved to 11.3. Continue with HP maintenance Radiation consult

## 2020-12-02 ENCOUNTER — Other Ambulatory Visit: Payer: Self-pay

## 2020-12-02 ENCOUNTER — Telehealth: Payer: Self-pay | Admitting: Hematology and Oncology

## 2020-12-02 ENCOUNTER — Inpatient Hospital Stay: Payer: Self-pay | Attending: Hematology and Oncology | Admitting: Hematology and Oncology

## 2020-12-02 DIAGNOSIS — D6481 Anemia due to antineoplastic chemotherapy: Secondary | ICD-10-CM | POA: Insufficient documentation

## 2020-12-02 DIAGNOSIS — Z5112 Encounter for antineoplastic immunotherapy: Secondary | ICD-10-CM | POA: Insufficient documentation

## 2020-12-02 DIAGNOSIS — C50112 Malignant neoplasm of central portion of left female breast: Secondary | ICD-10-CM

## 2020-12-02 DIAGNOSIS — T451X5A Adverse effect of antineoplastic and immunosuppressive drugs, initial encounter: Secondary | ICD-10-CM | POA: Insufficient documentation

## 2020-12-02 DIAGNOSIS — Z170 Estrogen receptor positive status [ER+]: Secondary | ICD-10-CM

## 2020-12-02 NOTE — Telephone Encounter (Signed)
Scheduled appointment per 07/06 los. Patient is aware. 

## 2020-12-03 ENCOUNTER — Encounter: Payer: Self-pay | Admitting: *Deleted

## 2020-12-03 ENCOUNTER — Ambulatory Visit: Payer: Self-pay | Admitting: Hematology and Oncology

## 2020-12-07 ENCOUNTER — Telehealth: Payer: Self-pay | Admitting: Cardiology

## 2020-12-07 NOTE — Telephone Encounter (Signed)
Patient calling the office for samples of medication:   1.  What medication and dosage are you requesting samples for? Eliquis and Entresto  2.  Are you currently out of this medication?  yes

## 2020-12-07 NOTE — Telephone Encounter (Signed)
*  STAT* If patient is at the pharmacy, call can be transferred to refill team.   1. Which medications need to be refilled? (please list name of each medication and dose if known) apixaban (ELIQUIS) 5 MG TABS tablet  2. Which pharmacy/location (including street and city if local pharmacy) is medication to be sent to? [facility]  3. Do they need a 30 day or 90 day supply? [place]

## 2020-12-08 ENCOUNTER — Encounter: Payer: Self-pay | Admitting: Hematology and Oncology

## 2020-12-08 ENCOUNTER — Other Ambulatory Visit (HOSPITAL_COMMUNITY): Payer: Self-pay

## 2020-12-08 MED ORDER — APIXABAN 5 MG PO TABS
5.0000 mg | ORAL_TABLET | Freq: Two times a day (BID) | ORAL | 1 refills | Status: DC
  Filled 2020-12-08: qty 60, 30d supply, fill #0

## 2020-12-08 NOTE — Telephone Encounter (Signed)
43f, 101.7kg, scr 0.7 11/19/20, lovw/revankar 09/29/20

## 2020-12-11 ENCOUNTER — Other Ambulatory Visit: Payer: Self-pay

## 2020-12-11 ENCOUNTER — Inpatient Hospital Stay: Payer: Self-pay

## 2020-12-11 VITALS — BP 137/75 | HR 68 | Temp 98.1°F | Resp 17 | Wt 229.0 lb

## 2020-12-11 DIAGNOSIS — C50112 Malignant neoplasm of central portion of left female breast: Secondary | ICD-10-CM

## 2020-12-11 DIAGNOSIS — Z170 Estrogen receptor positive status [ER+]: Secondary | ICD-10-CM

## 2020-12-11 LAB — CMP (CANCER CENTER ONLY)
ALT: 11 U/L (ref 0–44)
AST: 16 U/L (ref 15–41)
Albumin: 3.4 g/dL — ABNORMAL LOW (ref 3.5–5.0)
Alkaline Phosphatase: 91 U/L (ref 38–126)
Anion gap: 9 (ref 5–15)
BUN: 12 mg/dL (ref 6–20)
CO2: 29 mmol/L (ref 22–32)
Calcium: 9.2 mg/dL (ref 8.9–10.3)
Chloride: 105 mmol/L (ref 98–111)
Creatinine: 0.67 mg/dL (ref 0.44–1.00)
GFR, Estimated: >60 mL/min (ref >60)
Glucose, Bld: 110 mg/dL — ABNORMAL HIGH (ref 70–99)
Potassium: 3.6 mmol/L (ref 3.5–5.1)
Sodium: 143 mmol/L (ref 135–145)
Total Bilirubin: 0.7 mg/dL (ref 0.3–1.2)
Total Protein: 7.9 g/dL (ref 6.5–8.1)

## 2020-12-11 LAB — CBC WITH DIFFERENTIAL (CANCER CENTER ONLY)
Abs Immature Granulocytes: 0.01 10*3/uL (ref 0.00–0.07)
Basophils Absolute: 0 10*3/uL (ref 0.0–0.1)
Basophils Relative: 1 %
Eosinophils Absolute: 0.1 10*3/uL (ref 0.0–0.5)
Eosinophils Relative: 2 %
HCT: 33 % — ABNORMAL LOW (ref 36.0–46.0)
Hemoglobin: 11 g/dL — ABNORMAL LOW (ref 12.0–15.0)
Immature Granulocytes: 0 %
Lymphocytes Relative: 34 %
Lymphs Abs: 2.1 10*3/uL (ref 0.7–4.0)
MCH: 31.5 pg (ref 26.0–34.0)
MCHC: 33.3 g/dL (ref 30.0–36.0)
MCV: 94.6 fL (ref 80.0–100.0)
Monocytes Absolute: 0.4 10*3/uL (ref 0.1–1.0)
Monocytes Relative: 6 %
Neutro Abs: 3.6 10*3/uL (ref 1.7–7.7)
Neutrophils Relative %: 57 %
Platelet Count: 224 10*3/uL (ref 150–400)
RBC: 3.49 MIL/uL — ABNORMAL LOW (ref 3.87–5.11)
RDW: 15.2 % (ref 11.5–15.5)
WBC Count: 6.2 10*3/uL (ref 4.0–10.5)
nRBC: 0 % (ref 0.0–0.2)

## 2020-12-11 MED ORDER — DIPHENHYDRAMINE HCL 25 MG PO CAPS
50.0000 mg | ORAL_CAPSULE | Freq: Once | ORAL | Status: AC
  Administered 2020-12-11: 50 mg via ORAL

## 2020-12-11 MED ORDER — SODIUM CHLORIDE 0.9 % IV SOLN
420.0000 mg | Freq: Once | INTRAVENOUS | Status: AC
  Administered 2020-12-11: 420 mg via INTRAVENOUS
  Filled 2020-12-11: qty 14

## 2020-12-11 MED ORDER — TRASTUZUMAB CHEMO 150 MG IV SOLR
600.0000 mg | Freq: Once | INTRAVENOUS | Status: AC
  Administered 2020-12-11: 600 mg via INTRAVENOUS
  Filled 2020-12-11: qty 28.57

## 2020-12-11 MED ORDER — ACETAMINOPHEN 325 MG PO TABS
ORAL_TABLET | ORAL | Status: AC
  Filled 2020-12-11: qty 2

## 2020-12-11 MED ORDER — DIPHENHYDRAMINE HCL 25 MG PO CAPS
ORAL_CAPSULE | ORAL | Status: AC
  Filled 2020-12-11: qty 2

## 2020-12-11 MED ORDER — SODIUM CHLORIDE 0.9 % IV SOLN
Freq: Once | INTRAVENOUS | Status: AC
  Filled 2020-12-11: qty 250

## 2020-12-11 MED ORDER — SODIUM CHLORIDE 0.9% FLUSH
10.0000 mL | INTRAVENOUS | Status: DC | PRN
  Administered 2020-12-11: 10 mL
  Filled 2020-12-11: qty 10

## 2020-12-11 MED ORDER — ACETAMINOPHEN 325 MG PO TABS
650.0000 mg | ORAL_TABLET | Freq: Once | ORAL | Status: AC
  Administered 2020-12-11: 650 mg via ORAL

## 2020-12-11 MED ORDER — HEPARIN SOD (PORK) LOCK FLUSH 100 UNIT/ML IV SOLN
500.0000 [IU] | Freq: Once | INTRAVENOUS | Status: AC | PRN
  Administered 2020-12-11: 500 [IU]
  Filled 2020-12-11: qty 5

## 2020-12-11 NOTE — Patient Instructions (Signed)
[facility]  Discharge Instructions: Thank you for choosing [facility] to provide your oncology and hematology care.   If you have a lab appointment with the [facility], please go directly to the [facility] and check in at the registration area.   Wear comfortable clothing and clothing appropriate for easy access to any Portacath or PICC line.   We strive to give you quality time with your provider. You may need to reschedule your appointment if you arrive late (15 or more minutes).  Arriving late affects you and other patients whose appointments are after yours.  Also, if you miss three or more appointments without notifying the office, you may be dismissed from the clinic at the provider's discretion.      For prescription refill requests, have your pharmacy contact our office and allow 72 hours for refills to be completed.    Today you received the following chemotherapy and/or immunotherapy agents Trastuzumab and Pertuzumab.      To help prevent nausea and vomiting after your treatment, we encourage you to take your nausea medication as directed.  BELOW ARE SYMPTOMS THAT SHOULD BE REPORTED IMMEDIATELY: *FEVER GREATER THAN 100.4 F (38 C) OR HIGHER *CHILLS OR SWEATING *NAUSEA AND VOMITING THAT IS NOT CONTROLLED WITH YOUR NAUSEA MEDICATION *UNUSUAL SHORTNESS OF BREATH *UNUSUAL BRUISING OR BLEEDING *URINARY PROBLEMS (pain or burning when urinating, or frequent urination) *BOWEL PROBLEMS (unusual diarrhea, constipation, pain near the anus) TENDERNESS IN MOUTH AND THROAT WITH OR WITHOUT PRESENCE OF ULCERS (sore throat, sores in mouth, or a toothache) UNUSUAL RASH, SWELLING OR PAIN  UNUSUAL VAGINAL DISCHARGE OR ITCHING   Items with * indicate a potential emergency and should be followed up as soon as possible or go to the Emergency Department if any problems should occur.  Please show the CHEMOTHERAPY ALERT CARD or IMMUNOTHERAPY ALERT  CARD at check-in to the Emergency Department and triage nurse.  Should you have questions after your visit or need to cancel or reschedule your appointment, please contact [facility]  Dept: [phone]  and follow the prompts.  Office hours are 8:00 a.m. to 4:30 p.m. Monday - Friday. Please note that voicemails left after 4:00 p.m. may not be returned until the following business day.  We are closed weekends and major holidays. You have access to a nurse at all times for urgent questions. Please call the main number to the clinic Dept: [phone] and follow the prompts.   For any non-urgent questions, you may also contact your provider using MyChart. We now offer e-Visits for anyone 23 and older to request care online for non-urgent symptoms. For details visit [email or web address].   Also download the MyChart app! Go to the app store, search "MyChart", open the app, select [facility], and log in with your MyChart username and password.  Due to Covid, a mask is required upon entering the hospital/clinic. If you do not have a mask, one will be given to you upon arrival. For doctor visits, patients may have 1 support person aged 68 or older with them. For treatment visits, patients cannot have anyone with them due to current Covid guidelines and our immunocompromised population.

## 2020-12-14 NOTE — Progress Notes (Signed)
New Breast Cancer Diagnosis: Left Breast- Central Portion  Did patient present with symptoms (if so, please note symptoms) or screening mammography?:Palpable mass noted in the left breast in February 2021.   Location and Extent of disease :left breast. Located at 2 o'clock position, an 8 cm distortion and calcifications were noted by mammogram and ultrasound as well as 3 abnormal axillary lymph nodes.  Histology per Pathology Report: grade 3, Invasive Ductal Carcinoma 11/25/2020  Receptor Status: ER(positive), PR (positive), Her2-neu (positive), Ki-(%)  Surgeon and surgical plan, if any: Dr. Marlou Starks -Left Breast lumpectomy with radioactive seed and sentinel lymph node biopsy. -3/3 nodes negative    Medical oncologist, treatment if any:   Dr. Lindi Adie 12/02/2020 Treatment Plan: 1. Neoadjuvant chemotherapy with TCH Perjeta 6 cycles followed by Herceptin Perjeta versus Kadcyla maintenance for 1 year 2. 11/25/2020 lumpectomy with the targeted node dissection: Pathologic complete response 3/3 lymph nodes negative 3. Followed by adjuvant radiation therapy  4.  Followed by antiestrogen therapy and neratinib   Family History of Breast/Ovarian/Prostate Cancer: no  Lymphedema issues, if any: No    Pain issues, if any: none     SAFETY ISSUES: Prior radiation? No Pacemaker/ICD? No Possible current pregnancy? Postmenopausal Is the patient on methotrexate? No  Current Complaints / other details:

## 2020-12-15 ENCOUNTER — Encounter: Payer: Self-pay | Admitting: Radiation Oncology

## 2020-12-15 ENCOUNTER — Other Ambulatory Visit: Payer: Self-pay

## 2020-12-15 ENCOUNTER — Ambulatory Visit
Admission: RE | Admit: 2020-12-15 | Discharge: 2020-12-15 | Disposition: A | Discharge status: Home / Self Care | Payer: Self-pay | Source: Ambulatory Visit | Attending: Radiation Oncology | Admitting: Radiation Oncology

## 2020-12-15 DIAGNOSIS — Z170 Estrogen receptor positive status [ER+]: Secondary | ICD-10-CM

## 2020-12-15 DIAGNOSIS — C50112 Malignant neoplasm of central portion of left female breast: Secondary | ICD-10-CM

## 2020-12-15 NOTE — Progress Notes (Signed)
Radiation Oncology         [phone] ________________________________  Initial Outpatient Consultation - Conducted via telephone due to current COVID-19 concerns for limiting patient exposure  I spoke with the patient to conduct this consult visit via telephone to spare the patient unnecessary potential exposure in the healthcare setting during the current COVID-19 pandemic. The patient was notified in advance and was offered a [facility] meeting to allow for face to face communication but unfortunately reported that they did not have the appropriate resources/technology to support such a visit and instead preferred to proceed with a telephone consult.   Name: Courtney Coleman        MRN: [id number]  Date of Service: 12/15/2020 DOB: 11/21/1965  FY:[id number], [place] Cage, NP  Nicholas Lose, MD     REFERRING PHYSICIAN: Nicholas Lose, MD   DIAGNOSIS: The encounter diagnosis was Malignant neoplasm of central portion of left breast in female, estrogen receptor positive ([place]).   HISTORY OF PRESENT ILLNESS: Courtney Coleman is a 55 y.o. female seen for a diagnosis of left breast cancer. The patient was noted to have a palpable mass in the left breast that she noted last year. She proceeded with diagnostic imaging and MRI of the breast on 05/13/2020 showed an irregular mass with non-mass enhancement spanning 6 cm in total, she had had 3 abnormal appearing lymph nodes in the axilla on the left by ultrasound but MRI only noted two.  The right breast was negative for malignant changes or adenopathy.  CT imaging around that time also showed no evidence of metastatic disease.  Biopsies originally dated 01/08/2020 showed an invasive ductal carcinoma in the left breast specimen and FNA from the left axilla was also consistent with malignancy, her tumor was grade 3, ER/PR positive HER2 amplified.  She received neoadjuvant Arimidex beginning in October 2021, her chemo was delayed after a thalamic stroke and hospitalization  needed to manage her condition at that time.  She began systemic chemotherapy on 05/15/2020 and completed this regimen on 08/26/2020, she has continued with anti-HER2 therapy since.  She underwent left lumpectomy with targeted axillary dissection on 11/25/2020 final pathology revealed focal fibrosis with inflammation and calcifications but no residual carcinoma, her margins were clear, 3 lymph nodes in her specimen were examined and all were negative for disease. She's seen today to discuss adjuvant radiotherapy.    PREVIOUS RADIATION THERAPY: No   PAST MEDICAL HISTORY:  Past Medical History:  Diagnosis Date   Acute blood loss anemia    Alteration of sensations, post-stroke    Anemia 03/28/2020   Benign essential HTN    Bradycardia    Breast cancer ([place])    Cerebral embolism with cerebral infarction 03/28/2020   CHF (congestive heart failure) (HCC)    Controlled type 2 diabetes mellitus with hyperglycemia, without long-term current use of insulin (HCC)    CVA (cerebral vascular accident) ([place]) 03/29/2020   Dysesthesia    Dysrhythmia    Essential hypertension    Headache    migraines   Hypertension    Malignant neoplasm of central portion of left breast in female, estrogen receptor positive (HCC)    PAF (paroxysmal atrial fibrillation) ([place]) 03/28/2020   Port-A-Cath in place 05/14/2020   Right foot drop 03/28/2020   Right hemiparesis (HCC)    Slow transit constipation    Thalamic stroke ([place]) 04/01/2020       PAST SURGICAL HISTORY: Past Surgical History:  Procedure Laterality Date   BREAST BIOPSY  BREAST LUMPECTOMY WITH RADIOACTIVE SEED AND SENTINEL LYMPH NODE BIOPSY Left 11/25/2020   Procedure: LEFT BREAST LUMPECTOMY WITH RADIOACTIVE SEED AND SENTINEL LYMPH NODE BIOPSY;  Surgeon: Jovita Kussmaul, MD;  Location: [facility];  Service: General;  Laterality: Left;   CARDIAC CATHETERIZATION  01/2020   [facility]   IR IMAGING GUIDED PORT  INSERTION  05/06/2020   NODE DISSECTION Left 11/25/2020   Procedure: TARGETED NODE DISSECTION;  Surgeon: Jovita Kussmaul, MD;  Location: [facility] OR;  Service: General;  Laterality: Left;     FAMILY HISTORY:  Family History  Problem Relation Age of Onset   Heart disease Mother    Diabetes Father    Cancer Neg Hx      SOCIAL HISTORY:  reports that she has never smoked. She has never used smokeless tobacco. She reports previous alcohol use. She reports that she does not use drugs. The patient lives in [place] but is originally from the [place]. Her children are adults and live in the [place].    ALLERGIES: Patient has no known allergies.   MEDICATIONS:  Current Outpatient Medications  Medication Sig Dispense Refill   acetaminophen (TYLENOL) 500 MG tablet Take 1,000 mg by mouth every 6 (six) hours as needed for mild pain.     amiodarone (PACERONE) 200 MG tablet Take 1/2 tablet (100 mg total) by mouth daily. 15 tablet 2   amLODipine (NORVASC) 5 MG tablet Take 1 and 1/2 tablets (7.5 mg total) by mouth daily. 45 tablet 2   anastrozole (ARIMIDEX) 1 MG tablet Take 1 tablet (1 mg total) by mouth daily. 30 tablet 2   apixaban (ELIQUIS) 5 MG TABS tablet Take 1 tablet (5 mg total) by mouth 2 (two) times daily. 180 tablet 1   atorvastatin (LIPITOR) 40 MG tablet Take 1 tablet (40 mg total) by mouth daily. 30 tablet 2   hydrALAZINE (APRESOLINE) 10 MG tablet Take 1 tablet (10 mg total) by mouth every 8 (eight) hours. 90 tablet 2   HYDROcodone-acetaminophen (NORCO/VICODIN) 5-325 MG tablet Take 1-2 tablets by mouth every 6 (six) hours as needed for moderate pain or severe pain. 15 tablet 0   No current facility-administered medications for this encounter.     REVIEW OF SYSTEMS: On review of systems, the patient reports that she is doing well overall. She feels that she is doing well since chemotherapy and since surgery without breast pain. She continues to have some mild deficits from her stroke  with weakness in her hand and some slight foot drop. No other complaints are verbalized.      PHYSICAL EXAM:  Unable to assess due to encounter type.    ECOG = 1  0 - Asymptomatic (Fully active, able to carry on all predisease activities without restriction)  1 - Symptomatic but completely ambulatory (Restricted in physically strenuous activity but ambulatory and able to carry out work of a light or sedentary nature. For example, light housework, office work)  2 - Symptomatic, <50% in bed during the day (Ambulatory and capable of all self care but unable to carry out any work activities. Up and about more than 50% of waking hours)  3 - Symptomatic, >50% in bed, but not bedbound (Capable of only limited self-care, confined to bed or chair 50% or more of waking hours)  4 - Bedbound (Completely disabled. Cannot carry on any self-care. Totally confined to bed or chair)  5 - Death   Lolita Rieger, [facility], [place]  [place], et al. (1982). "Toxicity and response criteria of the [facility] Group". [place] Oncol. 5 (6): 649-55    LABORATORY DATA:  Lab Results  Component Value Date   WBC 6.2 12/11/2020   HGB 11.0 (L) 12/11/2020   HCT 33.0 (L) 12/11/2020   MCV 94.6 12/11/2020   PLT 224 12/11/2020   Lab Results  Component Value Date   NA 143 12/11/2020   K 3.6 12/11/2020   CL 105 12/11/2020   CO2 29 12/11/2020   Lab Results  Component Value Date   ALT 11 12/11/2020   AST 16 12/11/2020   ALKPHOS 91 12/11/2020   BILITOT 0.7 12/11/2020      RADIOGRAPHY: NM Sentinel Node Inj-No Rpt (Breast)  Result Date: 11/25/2020 Sulfur Colloid was injected by the Nuclear Medicine Technologist for sentinel lymph node localization.   MM Breast Surgical Specimen  Result Date: 11/25/2020 CLINICAL DATA:  Left lumpectomy for biopsy-proven invasive ductal carcinoma in the 2 o'clock position of the left breast. EXAM: SPECIMEN RADIOGRAPH OF THE LEFT BREAST COMPARISON:  Previous  exam(s). FINDINGS: Status post excision of the left breast. The radioactive seed and biopsy marker clip are present, completely intact, and were marked for pathology. IMPRESSION: Specimen radiograph of the left breast. Electronically Signed   By: Claudie Revering M.D.   On: 11/25/2020 10:30  MM Breast Surgical Specimen  Result Date: 11/25/2020 CLINICAL DATA:  Left lumpectomy and left axillary lymph node excision for biopsy-proven invasive ductal carcinoma and metastatic left axillary lymph node. EXAM: SPECIMEN RADIOGRAPH OF THE LEFT AXILLA COMPARISON:  Previous exam(s). FINDINGS: Status post excision of the left axilla. The radioactive seed, biopsy marker clip and 3 surgical clips are present in the specimen and are intact. IMPRESSION: Specimen radiograph of the left axilla. Electronically Signed   By: Claudie Revering M.D.   On: 11/25/2020 10:08  MM LT RADIOACTIVE SEED LOC MAMMO GUIDE  Result Date: 11/24/2020 CLINICAL DATA:  Pre lumpectomy localization of a left breast invasive ductal carcinoma in the 2 o'clock position posteriorly and metastatic left axillary lymph node, biopsied in [place]. EXAM: ULTRASOUND-GUIDED RADIOACTIVE SEED LOCALIZATION OF THE LEFT AXILLA MAMMOGRAPHIC GUIDED RADIOACTIVE SEED LOCALIZATION OF THE LEFT BREAST COMPARISON:  Previous exam(s). FINDINGS: Patient presents for radioactive seed localization prior to left lumpectomy. I met with the patient and we discussed the procedure of seed localization including benefits and alternatives. We discussed the high likelihood of successful procedures. We discussed the risks of the procedures including infection, bleeding, tissue injury and further surgery. We discussed the low dose of radioactivity involved in the procedures. Informed, written consent was given. The usual time-out protocol was performed immediately prior to the procedures. SEED 1: METASTATIC LEFT AXILLARY LYMPH NODE CONTAINING A BIOPSY MARKER CLIP Using ultrasound guidance,  sterile technique, 1% lidocaine and an I-125 radioactive seed, the previously biopsied metastatic left axillary lymph node containing a biopsy marker clip was localized using a caudal approach. The follow-up mammogram images confirm the seed in the expected location and were marked for Dr. Marlou Starks. Follow-up survey of the patient confirms presence of the radioactive seed. Order number of I-125 seed:  [id number]. Total activity:  0.244 mCi reference Date: 10/19/2020 SEED 2: BIOPSY MARKER CLIP IN THE POSTERIOR UPPER OUTER LEFT BREAST Using mammographic guidance, sterile technique, 1% lidocaine and an I-125 radioactive seed, the biopsy marker clip in the posterior upper outer left breast was localized using a lateral approach. The follow-up mammogram images confirm the seed in the expected  location and were marked for Dr. Marlou Starks. Follow-up survey of the patient confirms presence of the radioactive seed. Order number of I-125 seed:  [id number]. Total activity:  0.241 mCi reference Date: 10/30/2020 The patient tolerated the procedures well and was released from the [facility]. She was given instructions regarding seed removal. IMPRESSION: Radioactive seed localization left axilla and left breast. No apparent complications. Electronically Signed   By: Claudie Revering M.D.   On: 11/24/2020 14:44  [place] LT RADIOACTIVE SEED LOC  Result Date: 11/24/2020 CLINICAL DATA:  Pre lumpectomy localization of a left breast invasive ductal carcinoma in the 2 o'clock position posteriorly and metastatic left axillary lymph node, biopsied in [place]. EXAM: ULTRASOUND-GUIDED RADIOACTIVE SEED LOCALIZATION OF THE LEFT AXILLA MAMMOGRAPHIC GUIDED RADIOACTIVE SEED LOCALIZATION OF THE LEFT BREAST COMPARISON:  Previous exam(s). FINDINGS: Patient presents for radioactive seed localization prior to left lumpectomy. I met with the patient and we discussed the procedure of seed localization including benefits and alternatives. We discussed the high  likelihood of successful procedures. We discussed the risks of the procedures including infection, bleeding, tissue injury and further surgery. We discussed the low dose of radioactivity involved in the procedures. Informed, written consent was given. The usual time-out protocol was performed immediately prior to the procedures. SEED 1: METASTATIC LEFT AXILLARY LYMPH NODE CONTAINING A BIOPSY MARKER CLIP Using ultrasound guidance, sterile technique, 1% lidocaine and an I-125 radioactive seed, the previously biopsied metastatic left axillary lymph node containing a biopsy marker clip was localized using a caudal approach. The follow-up mammogram images confirm the seed in the expected location and were marked for Dr. Marlou Starks. Follow-up survey of the patient confirms presence of the radioactive seed. Order number of I-125 seed:  [id number]. Total activity:  0.244 mCi reference Date: 10/19/2020 SEED 2: BIOPSY MARKER CLIP IN THE POSTERIOR UPPER OUTER LEFT BREAST Using mammographic guidance, sterile technique, 1% lidocaine and an I-125 radioactive seed, the biopsy marker clip in the posterior upper outer left breast was localized using a lateral approach. The follow-up mammogram images confirm the seed in the expected location and were marked for Dr. Marlou Starks. Follow-up survey of the patient confirms presence of the radioactive seed. Order number of I-125 seed:  [id number]. Total activity:  0.241 mCi reference Date: 10/30/2020 The patient tolerated the procedures well and was released from the [facility]. She was given instructions regarding seed removal. IMPRESSION: Radioactive seed localization left axilla and left breast. No apparent complications. Electronically Signed   By: Claudie Revering M.D.   On: 11/24/2020 14:44      IMPRESSION/PLAN: 1. 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 Dr. Lisbeth Renshaw discusses the pathology findings and reviews the nature of node positive breast  disease. She has done well with her chemotherapy and has had a complete response to treatment. Dr. Garnet Koyanagi discusses the rationale for external radiotherapy to the breast  to reduce risks of local recurrence followed by antiestrogen therapy. We discussed the risks, benefits, short, and long term effects of radiotherapy, as well as the curative intent, and the patient is interested in proceeding. Dr. Lisbeth Renshaw discusses the delivery and logistics of radiotherapy and anticipates a course of 6 1/2 weeks of radiotherapy to the left breast and regional nodes with deep inspiration breath hold technique. She will simulate tomorrow morning at which time she will sign written consent to proceed.    Given current concerns for patient exposure during the COVID-19 pandemic, this encounter was conducted via telephone.  The patient has provided two factor identification and has given verbal consent for this type of encounter and has been advised to only accept a meeting of this type in a secure network environment. The time spent during this encounter was 60 minutes including preparation, discussion, and coordination of the patient's care. The attendants for this meeting include Blenda Nicely, RN, Dr. Lisbeth Renshaw, Hayden Pedro  and Corena Herter.  During the encounter,  Blenda Nicely, RN, Dr. Lisbeth Renshaw, and Hayden Pedro were located at [facility] Radiation Oncology Department.  Devine Aybar was located at home.    The above documentation reflects my direct findings during this shared patient visit. Please see the separate note by Dr. Lisbeth Renshaw on this date for the remainder of the patient's plan of care.    Carola Rhine, [facility]    **Disclaimer: This note was dictated with voice recognition software. Similar sounding words can inadvertently be transcribed and this note may contain transcription errors which may not have been corrected upon publication of note.**

## 2020-12-16 ENCOUNTER — Other Ambulatory Visit (HOSPITAL_COMMUNITY): Payer: Self-pay

## 2020-12-16 ENCOUNTER — Ambulatory Visit
Admission: RE | Admit: 2020-12-16 | Discharge: 2020-12-16 | Disposition: A | Discharge status: Home / Self Care | Payer: Self-pay | Source: Ambulatory Visit | Attending: Radiation Oncology | Admitting: Radiation Oncology

## 2020-12-16 DIAGNOSIS — C50112 Malignant neoplasm of central portion of left female breast: Secondary | ICD-10-CM | POA: Insufficient documentation

## 2020-12-21 ENCOUNTER — Encounter: Payer: Self-pay | Admitting: *Deleted

## 2020-12-25 ENCOUNTER — Other Ambulatory Visit (HOSPITAL_COMMUNITY): Payer: Self-pay

## 2020-12-25 ENCOUNTER — Encounter: Payer: Self-pay | Admitting: Hematology and Oncology

## 2020-12-25 ENCOUNTER — Other Ambulatory Visit (INDEPENDENT_AMBULATORY_CARE_PROVIDER_SITE_OTHER): Payer: Self-pay

## 2020-12-28 ENCOUNTER — Other Ambulatory Visit (HOSPITAL_COMMUNITY): Payer: Self-pay

## 2020-12-28 ENCOUNTER — Ambulatory Visit
Admission: RE | Admit: 2020-12-28 | Discharge: 2020-12-28 | Disposition: A | Discharge status: Home / Self Care | Payer: Self-pay | Source: Ambulatory Visit | Attending: Radiation Oncology | Admitting: Radiation Oncology

## 2020-12-28 DIAGNOSIS — Z510 Encounter for antineoplastic radiation therapy: Secondary | ICD-10-CM | POA: Insufficient documentation

## 2020-12-28 DIAGNOSIS — Z170 Estrogen receptor positive status [ER+]: Secondary | ICD-10-CM | POA: Insufficient documentation

## 2020-12-28 DIAGNOSIS — C50112 Malignant neoplasm of central portion of left female breast: Secondary | ICD-10-CM | POA: Insufficient documentation

## 2020-12-29 ENCOUNTER — Ambulatory Visit
Admission: RE | Admit: 2020-12-29 | Discharge: 2020-12-29 | Disposition: A | Discharge status: Home / Self Care | Payer: Self-pay | Source: Ambulatory Visit | Attending: Radiation Oncology | Admitting: Radiation Oncology

## 2020-12-29 ENCOUNTER — Other Ambulatory Visit: Payer: Self-pay

## 2020-12-30 ENCOUNTER — Ambulatory Visit
Admission: RE | Admit: 2020-12-30 | Discharge: 2020-12-30 | Disposition: A | Discharge status: Home / Self Care | Payer: Self-pay | Source: Ambulatory Visit | Attending: Radiation Oncology | Admitting: Radiation Oncology

## 2020-12-30 ENCOUNTER — Other Ambulatory Visit (HOSPITAL_COMMUNITY): Payer: Self-pay

## 2020-12-30 ENCOUNTER — Other Ambulatory Visit (INDEPENDENT_AMBULATORY_CARE_PROVIDER_SITE_OTHER): Payer: Self-pay

## 2020-12-31 ENCOUNTER — Ambulatory Visit
Admission: RE | Admit: 2020-12-31 | Discharge: 2020-12-31 | Disposition: A | Discharge status: Home / Self Care | Payer: Self-pay | Source: Ambulatory Visit | Attending: Radiation Oncology | Admitting: Radiation Oncology

## 2020-12-31 ENCOUNTER — Other Ambulatory Visit: Payer: Self-pay

## 2020-12-31 NOTE — Progress Notes (Signed)
Pt here for patient teaching.  Pt given Radiation and You booklet, skin care instructions, Alra deodorant, and Radiaplex gel.  Reviewed areas of pertinence such as fatigue, hair loss, skin changes, breast tenderness, and breast swelling . Pt able to give teach back of to pat skin and use unscented/gentle soap,apply Radiaplex bid, avoid applying anything to skin within 4 hours of treatment, avoid wearing an under wire bra, and to use an electric razor if they must shave. Pt verbalizes understanding of information given and will contact nursing with any questions or concerns.     Http://rtanswers.org/treatmentinformation/whattoexpect/index  Durga Saldarriaga M. Timmie Dugue RN, BSN       

## 2021-01-01 ENCOUNTER — Inpatient Hospital Stay: Payer: Self-pay | Attending: Hematology and Oncology

## 2021-01-01 ENCOUNTER — Ambulatory Visit
Admission: RE | Admit: 2021-01-01 | Discharge: 2021-01-01 | Disposition: A | Discharge status: Home / Self Care | Payer: Self-pay | Source: Ambulatory Visit | Attending: Radiation Oncology | Admitting: Radiation Oncology

## 2021-01-01 VITALS — BP 144/68 | HR 66 | Temp 98.7°F | Resp 18

## 2021-01-01 DIAGNOSIS — Z79899 Other long term (current) drug therapy: Secondary | ICD-10-CM | POA: Insufficient documentation

## 2021-01-01 DIAGNOSIS — Z170 Estrogen receptor positive status [ER+]: Secondary | ICD-10-CM

## 2021-01-01 DIAGNOSIS — Z5112 Encounter for antineoplastic immunotherapy: Secondary | ICD-10-CM | POA: Insufficient documentation

## 2021-01-01 DIAGNOSIS — C50112 Malignant neoplasm of central portion of left female breast: Secondary | ICD-10-CM | POA: Insufficient documentation

## 2021-01-01 LAB — CMP (CANCER CENTER ONLY)
ALT: 12 U/L (ref 0–44)
AST: 17 U/L (ref 15–41)
Albumin: 3.4 g/dL — ABNORMAL LOW (ref 3.5–5.0)
Alkaline Phosphatase: 90 U/L (ref 38–126)
Anion gap: 10 (ref 5–15)
BUN: 11 mg/dL (ref 6–20)
CO2: 26 mmol/L (ref 22–32)
Calcium: 9.1 mg/dL (ref 8.9–10.3)
Chloride: 104 mmol/L (ref 98–111)
Creatinine: 0.64 mg/dL (ref 0.44–1.00)
GFR, Estimated: >60 mL/min (ref >60)
Glucose, Bld: 110 mg/dL — ABNORMAL HIGH (ref 70–99)
Potassium: 3.5 mmol/L (ref 3.5–5.1)
Sodium: 140 mmol/L (ref 135–145)
Total Bilirubin: 1 mg/dL (ref 0.3–1.2)
Total Protein: 7.6 g/dL (ref 6.5–8.1)

## 2021-01-01 LAB — CBC WITH DIFFERENTIAL (CANCER CENTER ONLY)
Abs Immature Granulocytes: 0.01 10*3/uL (ref 0.00–0.07)
Basophils Absolute: 0 10*3/uL (ref 0.0–0.1)
Basophils Relative: 1 %
Eosinophils Absolute: 0.1 10*3/uL (ref 0.0–0.5)
Eosinophils Relative: 2 %
HCT: 32.6 % — ABNORMAL LOW (ref 36.0–46.0)
Hemoglobin: 10.9 g/dL — ABNORMAL LOW (ref 12.0–15.0)
Immature Granulocytes: 0 %
Lymphocytes Relative: 35 %
Lymphs Abs: 1.9 10*3/uL (ref 0.7–4.0)
MCH: 31.8 pg (ref 26.0–34.0)
MCHC: 33.4 g/dL (ref 30.0–36.0)
MCV: 95 fL (ref 80.0–100.0)
Monocytes Absolute: 0.3 10*3/uL (ref 0.1–1.0)
Monocytes Relative: 6 %
Neutro Abs: 3.1 10*3/uL (ref 1.7–7.7)
Neutrophils Relative %: 56 %
Platelet Count: 205 10*3/uL (ref 150–400)
RBC: 3.43 MIL/uL — ABNORMAL LOW (ref 3.87–5.11)
RDW: 15.2 % (ref 11.5–15.5)
WBC Count: 5.5 10*3/uL (ref 4.0–10.5)
nRBC: 0 % (ref 0.0–0.2)

## 2021-01-01 MED ORDER — SODIUM CHLORIDE 0.9% FLUSH
10.0000 mL | INTRAVENOUS | Status: DC | PRN
  Administered 2021-01-01: 10 mL
  Filled 2021-01-01: qty 10

## 2021-01-01 MED ORDER — HEPARIN SOD (PORK) LOCK FLUSH 100 UNIT/ML IV SOLN
500.0000 [IU] | Freq: Once | INTRAVENOUS | Status: AC | PRN
  Administered 2021-01-01: 500 [IU]
  Filled 2021-01-01: qty 5

## 2021-01-01 MED ORDER — SODIUM CHLORIDE 0.9 % IV SOLN
420.0000 mg | Freq: Once | INTRAVENOUS | Status: AC
  Administered 2021-01-01: 420 mg via INTRAVENOUS
  Filled 2021-01-01: qty 14

## 2021-01-01 MED ORDER — ACETAMINOPHEN 325 MG PO TABS
ORAL_TABLET | ORAL | Status: AC
  Filled 2021-01-01: qty 2

## 2021-01-01 MED ORDER — ALRA NON-METALLIC DEODORANT (RAD-ONC)
1.0000 "application " | Freq: Once | TOPICAL | Status: AC
  Administered 2021-01-01: 1 via TOPICAL

## 2021-01-01 MED ORDER — DIPHENHYDRAMINE HCL 25 MG PO CAPS
50.0000 mg | ORAL_CAPSULE | Freq: Once | ORAL | Status: AC
  Administered 2021-01-01: 50 mg via ORAL

## 2021-01-01 MED ORDER — ACETAMINOPHEN 325 MG PO TABS
650.0000 mg | ORAL_TABLET | Freq: Once | ORAL | Status: AC
  Administered 2021-01-01: 650 mg via ORAL

## 2021-01-01 MED ORDER — DIPHENHYDRAMINE HCL 25 MG PO CAPS
ORAL_CAPSULE | ORAL | Status: AC
  Filled 2021-01-01: qty 2

## 2021-01-01 MED ORDER — SODIUM CHLORIDE 0.9 % IV SOLN
Freq: Once | INTRAVENOUS | Status: AC
  Filled 2021-01-01: qty 250

## 2021-01-01 MED ORDER — TRASTUZUMAB CHEMO 150 MG IV SOLR
600.0000 mg | Freq: Once | INTRAVENOUS | Status: AC
  Administered 2021-01-01: 600 mg via INTRAVENOUS
  Filled 2021-01-01: qty 28.57

## 2021-01-01 MED ORDER — RADIAPLEXRX EX GEL: Freq: Once | CUTANEOUS | Status: AC

## 2021-01-01 NOTE — Progress Notes (Signed)
Pt stayed for full 30 min observation after perjeta, pt discharged in stable condition, ambulatory to lobby with walker, and without any complaints.

## 2021-01-01 NOTE — Patient Instructions (Signed)
[facility]  Discharge Instructions: Thank you for choosing [facility] to provide your oncology and hematology care.   If you have a lab appointment with the [facility], please go directly to the [facility] and check in at the registration area.   Wear comfortable clothing and clothing appropriate for easy access to any Portacath or PICC line.   We strive to give you quality time with your provider. You may need to reschedule your appointment if you arrive late (15 or more minutes).  Arriving late affects you and other patients whose appointments are after yours.  Also, if you miss three or more appointments without notifying the office, you may be dismissed from the clinic at the provider's discretion.      For prescription refill requests, have your pharmacy contact our office and allow 72 hours for refills to be completed.    Today you received the following chemotherapy and/or immunotherapy agents herceptin/perjeta       To help prevent nausea and vomiting after your treatment, we encourage you to take your nausea medication as directed.  BELOW ARE SYMPTOMS THAT SHOULD BE REPORTED IMMEDIATELY: *FEVER GREATER THAN 100.4 F (38 C) OR HIGHER *CHILLS OR SWEATING *NAUSEA AND VOMITING THAT IS NOT CONTROLLED WITH YOUR NAUSEA MEDICATION *UNUSUAL SHORTNESS OF BREATH *UNUSUAL BRUISING OR BLEEDING *URINARY PROBLEMS (pain or burning when urinating, or frequent urination) *BOWEL PROBLEMS (unusual diarrhea, constipation, pain near the anus) TENDERNESS IN MOUTH AND THROAT WITH OR WITHOUT PRESENCE OF ULCERS (sore throat, sores in mouth, or a toothache) UNUSUAL RASH, SWELLING OR PAIN  UNUSUAL VAGINAL DISCHARGE OR ITCHING   Items with * indicate a potential emergency and should be followed up as soon as possible or go to the Emergency Department if any problems should occur.  Please show the CHEMOTHERAPY ALERT CARD or IMMUNOTHERAPY ALERT CARD at  check-in to the Emergency Department and triage nurse.  Should you have questions after your visit or need to cancel or reschedule your appointment, please contact [facility]  Dept: [phone]  and follow the prompts.  Office hours are 8:00 a.m. to 4:30 p.m. Monday - Friday. Please note that voicemails left after 4:00 p.m. may not be returned until the following business day.  We are closed weekends and major holidays. You have access to a nurse at all times for urgent questions. Please call the main number to the clinic Dept: [phone] and follow the prompts.   For any non-urgent questions, you may also contact your provider using MyChart. We now offer e-Visits for anyone 3 and older to request care online for non-urgent symptoms. For details visit [email or web address].   Also download the MyChart app! Go to the app store, search "MyChart", open the app, select [facility], and log in with your MyChart username and password.  Due to Covid, a mask is required upon entering the hospital/clinic. If you do not have a mask, one will be given to you upon arrival. For doctor visits, patients may have 1 support person aged 4 or older with them. For treatment visits, patients cannot have anyone with them due to current Covid guidelines and our immunocompromised population.

## 2021-01-04 ENCOUNTER — Other Ambulatory Visit: Payer: Self-pay

## 2021-01-04 ENCOUNTER — Other Ambulatory Visit (HOSPITAL_COMMUNITY): Payer: Self-pay

## 2021-01-04 ENCOUNTER — Ambulatory Visit
Admission: RE | Admit: 2021-01-04 | Discharge: 2021-01-04 | Disposition: A | Discharge status: Home / Self Care | Payer: Self-pay | Source: Ambulatory Visit | Attending: Radiation Oncology | Admitting: Radiation Oncology

## 2021-01-05 ENCOUNTER — Ambulatory Visit
Admission: RE | Admit: 2021-01-05 | Discharge: 2021-01-05 | Disposition: A | Discharge status: Home / Self Care | Payer: Self-pay | Source: Ambulatory Visit | Attending: Radiation Oncology | Admitting: Radiation Oncology

## 2021-01-06 ENCOUNTER — Other Ambulatory Visit: Payer: Self-pay

## 2021-01-06 ENCOUNTER — Ambulatory Visit
Admission: RE | Admit: 2021-01-06 | Discharge: 2021-01-06 | Disposition: A | Discharge status: Home / Self Care | Payer: Self-pay | Source: Ambulatory Visit | Attending: Radiation Oncology | Admitting: Radiation Oncology

## 2021-01-06 ENCOUNTER — Other Ambulatory Visit (HOSPITAL_COMMUNITY): Payer: Self-pay

## 2021-01-07 ENCOUNTER — Ambulatory Visit
Admission: RE | Admit: 2021-01-07 | Discharge: 2021-01-07 | Disposition: A | Discharge status: Home / Self Care | Payer: Self-pay | Source: Ambulatory Visit | Attending: Radiation Oncology | Admitting: Radiation Oncology

## 2021-01-07 ENCOUNTER — Other Ambulatory Visit (HOSPITAL_COMMUNITY): Payer: Self-pay

## 2021-01-08 ENCOUNTER — Other Ambulatory Visit: Payer: Self-pay

## 2021-01-08 ENCOUNTER — Ambulatory Visit
Admission: RE | Admit: 2021-01-08 | Discharge: 2021-01-08 | Disposition: A | Discharge status: Home / Self Care | Payer: Self-pay | Source: Ambulatory Visit | Attending: Radiation Oncology | Admitting: Radiation Oncology

## 2021-01-11 ENCOUNTER — Other Ambulatory Visit: Payer: Self-pay

## 2021-01-11 ENCOUNTER — Ambulatory Visit
Admission: RE | Admit: 2021-01-11 | Discharge: 2021-01-11 | Disposition: A | Discharge status: Home / Self Care | Payer: Self-pay | Source: Ambulatory Visit | Attending: Radiation Oncology | Admitting: Radiation Oncology

## 2021-01-12 ENCOUNTER — Ambulatory Visit
Admission: RE | Admit: 2021-01-12 | Discharge: 2021-01-12 | Disposition: A | Discharge status: Home / Self Care | Payer: Self-pay | Source: Ambulatory Visit | Attending: Radiation Oncology | Admitting: Radiation Oncology

## 2021-01-13 ENCOUNTER — Other Ambulatory Visit: Payer: Self-pay

## 2021-01-13 ENCOUNTER — Ambulatory Visit
Admission: RE | Admit: 2021-01-13 | Discharge: 2021-01-13 | Disposition: A | Discharge status: Home / Self Care | Payer: Self-pay | Source: Ambulatory Visit | Attending: Radiation Oncology | Admitting: Radiation Oncology

## 2021-01-14 ENCOUNTER — Ambulatory Visit
Admission: RE | Admit: 2021-01-14 | Discharge: 2021-01-14 | Disposition: A | Discharge status: Home / Self Care | Payer: Self-pay | Source: Ambulatory Visit | Attending: Radiation Oncology | Admitting: Radiation Oncology

## 2021-01-15 ENCOUNTER — Other Ambulatory Visit: Payer: Self-pay

## 2021-01-15 ENCOUNTER — Ambulatory Visit
Admission: RE | Admit: 2021-01-15 | Discharge: 2021-01-15 | Disposition: A | Discharge status: Home / Self Care | Payer: Self-pay | Source: Ambulatory Visit | Attending: Radiation Oncology | Admitting: Radiation Oncology

## 2021-01-15 DIAGNOSIS — Z170 Estrogen receptor positive status [ER+]: Secondary | ICD-10-CM

## 2021-01-15 DIAGNOSIS — C50112 Malignant neoplasm of central portion of left female breast: Secondary | ICD-10-CM

## 2021-01-15 MED ORDER — RADIAPLEXRX EX GEL: Freq: Once | CUTANEOUS | Status: AC

## 2021-01-18 ENCOUNTER — Ambulatory Visit
Admission: RE | Admit: 2021-01-18 | Discharge: 2021-01-18 | Disposition: A | Discharge status: Home / Self Care | Payer: Self-pay | Source: Ambulatory Visit | Attending: Radiation Oncology | Admitting: Radiation Oncology

## 2021-01-18 ENCOUNTER — Other Ambulatory Visit: Payer: Self-pay

## 2021-01-18 ENCOUNTER — Ambulatory Visit: Payer: Self-pay

## 2021-01-19 ENCOUNTER — Ambulatory Visit
Admission: RE | Admit: 2021-01-19 | Discharge: 2021-01-19 | Disposition: A | Discharge status: Home / Self Care | Payer: Self-pay | Source: Ambulatory Visit | Attending: Radiation Oncology | Admitting: Radiation Oncology

## 2021-01-20 ENCOUNTER — Other Ambulatory Visit: Payer: Self-pay

## 2021-01-20 ENCOUNTER — Encounter: Payer: Self-pay | Admitting: Radiation Oncology

## 2021-01-20 ENCOUNTER — Ambulatory Visit
Admission: RE | Admit: 2021-01-20 | Discharge: 2021-01-20 | Disposition: A | Discharge status: Home / Self Care | Payer: Self-pay | Source: Ambulatory Visit | Attending: Radiation Oncology | Admitting: Radiation Oncology

## 2021-01-21 ENCOUNTER — Ambulatory Visit
Admission: RE | Admit: 2021-01-21 | Discharge: 2021-01-21 | Disposition: A | Discharge status: Home / Self Care | Payer: Self-pay | Source: Ambulatory Visit | Attending: Radiation Oncology | Admitting: Radiation Oncology

## 2021-01-21 NOTE — Progress Notes (Signed)
Patient Care Team: Kerin Perna, NP as PCP - General (Internal Medicine) Revankar, Reita Cliche, MD as PCP - Cardiology (Cardiology) Mauro Kaufmann, RN as Oncology Nurse Navigator Rockwell Germany, RN as Oncology Nurse Navigator Nicholas Lose, MD as Consulting Physician (Hematology and Oncology) Revankar, Reita Cliche, MD as Consulting Physician (Cardiology) Jovita Kussmaul, MD as Consulting Physician (General Surgery)  DIAGNOSIS:    ICD-10-CM   1. 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 with above-mentioned history of  left breast cancer having undergone lumpectomy, currently on radiation therapy and now on Herceptin and Perjeta. She presents to the clinic today for treatment.  She is currently on radiation and is tolerating it fairly well.  She does not have any major side effects to  Herceptin and Perjeta.  She tells me that the port is irritating her and she would like to get it out and receive the rest of the treatment through peripheral IV.  ALLERGIES:  has No Known Allergies.  MEDICATIONS:  Current Outpatient Medications  Medication Sig Dispense Refill   acetaminophen (TYLENOL) 500 MG tablet Take 1,000 mg by mouth every 6 (six) hours as needed for mild pain.     amiodarone (PACERONE) 200 MG tablet Take 1/2 tablet (100 mg total) by mouth daily. 15 tablet 2   amLODipine (NORVASC) 5 MG tablet Take 1 and 1/2 tablets (7.5 mg total) by mouth daily. 45 tablet 2   anastrozole (ARIMIDEX) 1 MG tablet Take 1 tablet (1 mg total) by mouth daily. 30 tablet 2   apixaban (ELIQUIS) 5 MG TABS tablet Take 1 tablet (5 mg total) by mouth 2 (two) times daily. 180 tablet 1   atorvastatin (LIPITOR) 40 MG tablet Take 1 tablet (40 mg total) by mouth daily. 30 tablet 2   hydrALAZINE (APRESOLINE) 10 MG tablet Take 1 tablet (10 mg total) by mouth every 8 (eight) hours. 90 tablet 2   No current facility-administered medications for this visit.    PHYSICAL EXAMINATION: ECOG PERFORMANCE STATUS: 1 - Symptomatic but completely ambulatory  Vitals:   01/22/21 0853  BP: (!) 161/66  Pulse: 68  Resp: 18  Temp: 97.8 F (36.6 C)  SpO2: 100%   Filed Weights   01/22/21 0853  Weight: 228 lb 1.6 oz (103.5 kg)    LABORATORY DATA:  I have reviewed the data as listed CMP Latest Ref  Rng & Units 01/01/2021 12/11/2020 11/19/2020  Glucose 70 - 99 mg/dL 110(H) 110(H) 112(H)  BUN 6 - 20 mg/dL _0 Creatinine 0.44 - 1.00 mg/dL 0.64 0.67 0.70  Sodium 135 - 145 mmol/L 140 143 141  Potassium 3.5 - 5.1 mmol/L 3.5 3.6 3.4(L)  Chloride 98 - 111 mmol/L 104 105 105  CO2 22 - 32 mmol/L _1 Calcium 8.9 - 10.3 mg/dL 9.1 9.2 9.1  Total Protein 6.5 - 8.1 g/dL 7.6 7.9 7.7  Total Bilirubin 0.3 - 1.2 mg/dL 1.0 0.7 1.0  Alkaline Phos 38 - 126 U/L 90 91 85  AST 15 - 41 U/L _2 ALT 0 - 44 U/L _3 Lab Results  Component Value Date   WBC 4.5 01/22/2021   HGB 10.9 (L) 01/22/2021   HCT 33.3 (L) 01/22/2021   MCV 95.4 01/22/2021   PLT 198 01/22/2021   NEUTROABS 3.1 01/22/2021    ASSESSMENT & PLAN:  Malignant neoplasm of central portion of left breast in female, estrogen receptor positive ([facility]) Palpable left breast mass started February 2021 (patient lives in the [place]), went to [facility] and mammogram and ultrasound 01/03/2020: 8 cm distortion and calcifications left breast 2:00 and 3 abnormal axillary lymph nodes.  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   Treatment Plan:: 1. Neoadjuvant chemotherapy with TCH Perjeta 6 cycles followed by Herceptin Perjeta versus Kadcyla maintenance for 1 year 2. 11/25/2020 lumpectomy with the targeted node dissection: Pathologic complete response 0/3 lymph nodes negative 3. Followed by adjuvant radiation therapy  4.  Followed by antiestrogen therapy and neratinib -------------------------------------------------------------------------------------------------------------------------------- Current Treatment: Herceptin Perjeta Maintenance Left Lumpectomy 11/25/2020: complete path response' 0/3 LN   Chemotherapy-induced anemia: Hemoglobin has improved to 10.9 Continue with HP maintenance Radiation ongoing She would like to get the port removed because it is irritating her.  She will receive the rest of the Herceptin Perjeta treatments through a peripheral IV. She discussed with me that she would like to get annual follow-ups at [place] closer to her home.  I instructed her that she needs a mammogram once a year in November. Return to clinic every 3 weeks for Herceptin Perjeta and every 6 weeks with follow-up with me.      No orders of the defined types were placed in this encounter.  The patient has a good understanding of the overall plan. she agrees with it. she will call with any problems that may develop  before the next visit here.  Total time spent: 30 mins including face to face time and time spent for planning, charting and coordination of care  Rulon Eisenmenger, MD, MPH 01/22/2021  I, Thana Ates, am acting as scribe for Dr. Nicholas Lose.  I have reviewed the above documentation for accuracy and completeness, and I agree with the above.

## 2021-01-21 NOTE — Assessment & Plan Note (Signed)
Palpable left breast mass started February 2021 (patient lives in the [place]), went to [facility] and mammogram and ultrasound 01/03/2020: 8 cm distortion and calcifications left breast 2:00 and 3 abnormal axillary lymph nodes. 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  Treatment Plan:: 1. Neoadjuvant chemotherapy with TCH Perjeta 6 cycles followed by HerceptinPerjeta versus Kadcylamaintenance for 1 year 2. 6/29/2022lumpectomy with the targeted node dissection: Pathologic complete response 0/3 lymph nodes negative 3. Followed by adjuvant radiation therapy 4.Followed by antiestrogen therapy and neratinib -------------------------------------------------------------------------------------------------------------------------------- Current Treatment:Herceptin Perjeta Maintenance Left Lumpectomy6/29/2022: complete path response' 0/3 LN  Chemotherapy-induced anemia: Hemoglobin has improved to 11.3. Continue with HP maintenance Radiation consult  Patient and her husband were ecstatic hearing the results of surgery Return to clinic every 3 weeks for Herceptin Perjeta and every 6 weeks with follow-up with me.

## 2021-01-22 ENCOUNTER — Inpatient Hospital Stay: Payer: Self-pay

## 2021-01-22 ENCOUNTER — Inpatient Hospital Stay (HOSPITAL_BASED_OUTPATIENT_CLINIC_OR_DEPARTMENT_OTHER): Payer: Self-pay | Admitting: Hematology and Oncology

## 2021-01-22 ENCOUNTER — Other Ambulatory Visit: Payer: Self-pay | Admitting: *Deleted

## 2021-01-22 ENCOUNTER — Other Ambulatory Visit (HOSPITAL_COMMUNITY): Payer: Self-pay

## 2021-01-22 ENCOUNTER — Encounter: Payer: Self-pay | Admitting: Hematology and Oncology

## 2021-01-22 ENCOUNTER — Other Ambulatory Visit: Payer: Self-pay

## 2021-01-22 ENCOUNTER — Ambulatory Visit
Admission: RE | Admit: 2021-01-22 | Discharge: 2021-01-22 | Disposition: A | Discharge status: Home / Self Care | Payer: Self-pay | Source: Ambulatory Visit | Attending: Radiation Oncology | Admitting: Radiation Oncology

## 2021-01-22 DIAGNOSIS — Z170 Estrogen receptor positive status [ER+]: Secondary | ICD-10-CM

## 2021-01-22 DIAGNOSIS — C50112 Malignant neoplasm of central portion of left female breast: Secondary | ICD-10-CM

## 2021-01-22 DIAGNOSIS — Z95828 Presence of other vascular implants and grafts: Secondary | ICD-10-CM

## 2021-01-22 LAB — CMP (CANCER CENTER ONLY)
ALT: 15 U/L (ref 0–44)
AST: 17 U/L (ref 15–41)
Albumin: 3.5 g/dL (ref 3.5–5.0)
Alkaline Phosphatase: 98 U/L (ref 38–126)
Anion gap: 10 (ref 5–15)
BUN: 8 mg/dL (ref 6–20)
CO2: 27 mmol/L (ref 22–32)
Calcium: 9 mg/dL (ref 8.9–10.3)
Chloride: 105 mmol/L (ref 98–111)
Creatinine: 0.72 mg/dL (ref 0.44–1.00)
GFR, Estimated: >60 mL/min (ref >60)
Glucose, Bld: 148 mg/dL — ABNORMAL HIGH (ref 70–99)
Potassium: 3.1 mmol/L — ABNORMAL LOW (ref 3.5–5.1)
Sodium: 142 mmol/L (ref 135–145)
Total Bilirubin: 0.9 mg/dL (ref 0.3–1.2)
Total Protein: 7.7 g/dL (ref 6.5–8.1)

## 2021-01-22 LAB — CBC WITH DIFFERENTIAL (CANCER CENTER ONLY)
Abs Immature Granulocytes: 0.01 10*3/uL (ref 0.00–0.07)
Basophils Absolute: 0 10*3/uL (ref 0.0–0.1)
Basophils Relative: 0 %
Eosinophils Absolute: 0.1 10*3/uL (ref 0.0–0.5)
Eosinophils Relative: 2 %
HCT: 33.3 % — ABNORMAL LOW (ref 36.0–46.0)
Hemoglobin: 10.9 g/dL — ABNORMAL LOW (ref 12.0–15.0)
Immature Granulocytes: 0 %
Lymphocytes Relative: 21 %
Lymphs Abs: 1 10*3/uL (ref 0.7–4.0)
MCH: 31.2 pg (ref 26.0–34.0)
MCHC: 32.7 g/dL (ref 30.0–36.0)
MCV: 95.4 fL (ref 80.0–100.0)
Monocytes Absolute: 0.3 10*3/uL (ref 0.1–1.0)
Monocytes Relative: 8 %
Neutro Abs: 3.1 10*3/uL (ref 1.7–7.7)
Neutrophils Relative %: 69 %
Platelet Count: 198 10*3/uL (ref 150–400)
RBC: 3.49 MIL/uL — ABNORMAL LOW (ref 3.87–5.11)
RDW: 15.5 % (ref 11.5–15.5)
WBC Count: 4.5 10*3/uL (ref 4.0–10.5)
nRBC: 0 % (ref 0.0–0.2)

## 2021-01-22 MED ORDER — SODIUM CHLORIDE 0.9% FLUSH
10.0000 mL | Freq: Once | INTRAVENOUS | Status: AC
Start: 2021-01-22 — End: 2021-01-22
  Administered 2021-01-22: 10 mL

## 2021-01-22 MED ORDER — APIXABAN 5 MG PO TABS
5.0000 mg | ORAL_TABLET | Freq: Two times a day (BID) | ORAL | 1 refills | Status: DC
  Filled 2021-01-22 – 2021-01-26 (×3): qty 60, 30d supply, fill #0
  Filled 2021-03-04 (×2): qty 60, 30d supply, fill #1

## 2021-01-22 MED ORDER — ANASTROZOLE 1 MG PO TABS
1.0000 mg | ORAL_TABLET | Freq: Every day | ORAL | 2 refills | Status: DC
  Filled 2021-01-22: qty 30, 30d supply, fill #0
  Filled 2021-03-04: qty 30, 30d supply, fill #1

## 2021-01-22 MED ORDER — ACETAMINOPHEN 325 MG PO TABS
650.0000 mg | ORAL_TABLET | Freq: Once | ORAL | Status: AC
  Administered 2021-01-22: 650 mg via ORAL
  Filled 2021-01-22: qty 2

## 2021-01-22 MED ORDER — AMLODIPINE BESYLATE 5 MG PO TABS
7.5000 mg | ORAL_TABLET | Freq: Every day | ORAL | 2 refills | Status: AC
  Filled 2021-01-22: qty 45, 30d supply, fill #0
  Filled 2021-03-04: qty 45, 30d supply, fill #1
  Filled 2021-04-15: qty 45, 30d supply, fill #2

## 2021-01-22 MED ORDER — PERTUZUMAB CHEMO INJECTION 420 MG/14ML
420.0000 mg | Freq: Once | INTRAVENOUS | Status: AC
Start: 2021-01-22 — End: 2021-01-22
  Administered 2021-01-22: 420 mg via INTRAVENOUS
  Filled 2021-01-22: qty 14

## 2021-01-22 MED ORDER — TRASTUZUMAB CHEMO 150 MG IV SOLR
600.0000 mg | Freq: Once | INTRAVENOUS | Status: AC
  Administered 2021-01-22: 600 mg via INTRAVENOUS
  Filled 2021-01-22: qty 28.57

## 2021-01-22 MED ORDER — ATORVASTATIN CALCIUM 40 MG PO TABS
40.0000 mg | ORAL_TABLET | Freq: Every day | ORAL | 2 refills | Status: DC
  Filled 2021-01-22: qty 30, 30d supply, fill #0
  Filled 2021-03-04: qty 30, 30d supply, fill #1

## 2021-01-22 MED ORDER — SODIUM CHLORIDE 0.9% FLUSH
10.0000 mL | INTRAVENOUS | Status: DC | PRN
  Administered 2021-01-22: 10 mL

## 2021-01-22 MED ORDER — AMIODARONE HCL 200 MG PO TABS
100.0000 mg | ORAL_TABLET | Freq: Every day | ORAL | 2 refills | Status: DC
  Filled 2021-01-22: qty 15, 30d supply, fill #0
  Filled 2021-03-04: qty 15, 30d supply, fill #1

## 2021-01-22 MED ORDER — DIPHENHYDRAMINE HCL 25 MG PO CAPS
50.0000 mg | ORAL_CAPSULE | Freq: Once | ORAL | Status: AC
  Administered 2021-01-22: 50 mg via ORAL
  Filled 2021-01-22: qty 2

## 2021-01-22 MED ORDER — HYDRALAZINE HCL 10 MG PO TABS
10.0000 mg | ORAL_TABLET | Freq: Three times a day (TID) | ORAL | 2 refills | Status: AC
  Filled 2021-01-22: qty 78, 26d supply, fill #0
  Filled 2021-01-22: qty 12, 4d supply, fill #0
  Filled 2021-03-05: qty 90, 30d supply, fill #1
  Filled 2021-03-05: qty 90, 30d supply, fill #0
  Filled 2021-04-15: qty 90, 30d supply, fill #1

## 2021-01-22 MED ORDER — SODIUM CHLORIDE 0.9 % IV SOLN: Freq: Once | INTRAVENOUS | Status: AC

## 2021-01-22 MED ORDER — HEPARIN SOD (PORK) LOCK FLUSH 100 UNIT/ML IV SOLN
500.0000 [IU] | Freq: Once | INTRAVENOUS | Status: AC | PRN
  Administered 2021-01-22: 500 [IU]

## 2021-01-22 MED ORDER — POTASSIUM CHLORIDE CRYS ER 20 MEQ PO TBCR
20.0000 meq | EXTENDED_RELEASE_TABLET | Freq: Every day | ORAL | 2 refills | Status: DC
  Filled 2021-01-22: qty 30, 30d supply, fill #0

## 2021-01-22 NOTE — Patient Instructions (Signed)
[facility]  Discharge Instructions: Thank you for choosing [facility] to provide your oncology and hematology care.   If you have a lab appointment with the [facility], please go directly to the [facility] and check in at the registration area.   Wear comfortable clothing and clothing appropriate for easy access to any Portacath or PICC line.   We strive to give you quality time with your provider. You may need to reschedule your appointment if you arrive late (15 or more minutes).  Arriving late affects you and other patients whose appointments are after yours.  Also, if you miss three or more appointments without notifying the office, you may be dismissed from the clinic at the provider's discretion.      For prescription refill requests, have your pharmacy contact our office and allow 72 hours for refills to be completed.    Today you received the following chemotherapy and/or immunotherapy agents Herceptin and Perjeta      To help prevent nausea and vomiting after your treatment, we encourage you to take your nausea medication as directed.  BELOW ARE SYMPTOMS THAT SHOULD BE REPORTED IMMEDIATELY: *FEVER GREATER THAN 100.4 F (38 C) OR HIGHER *CHILLS OR SWEATING *NAUSEA AND VOMITING THAT IS NOT CONTROLLED WITH YOUR NAUSEA MEDICATION *UNUSUAL SHORTNESS OF BREATH *UNUSUAL BRUISING OR BLEEDING *URINARY PROBLEMS (pain or burning when urinating, or frequent urination) *BOWEL PROBLEMS (unusual diarrhea, constipation, pain near the anus) TENDERNESS IN MOUTH AND THROAT WITH OR WITHOUT PRESENCE OF ULCERS (sore throat, sores in mouth, or a toothache) UNUSUAL RASH, SWELLING OR PAIN  UNUSUAL VAGINAL DISCHARGE OR ITCHING   Items with * indicate a potential emergency and should be followed up as soon as possible or go to the Emergency Department if any problems should occur.  Please show the CHEMOTHERAPY ALERT CARD or IMMUNOTHERAPY ALERT CARD at  check-in to the Emergency Department and triage nurse.  Should you have questions after your visit or need to cancel or reschedule your appointment, please contact [facility]  Dept: [phone]  and follow the prompts.  Office hours are 8:00 a.m. to 4:30 p.m. Monday - Friday. Please note that voicemails left after 4:00 p.m. may not be returned until the following business day.  We are closed weekends and major holidays. You have access to a nurse at all times for urgent questions. Please call the main number to the clinic Dept: [phone] and follow the prompts.   For any non-urgent questions, you may also contact your provider using MyChart. We now offer e-Visits for anyone 55 and older to request care online for non-urgent symptoms. For details visit [email or web address].   Also download the MyChart app! Go to the app store, search "MyChart", open the app, select [facility], and log in with your MyChart username and password.  Due to Covid, a mask is required upon entering the hospital/clinic. If you do not have a mask, one will be given to you upon arrival. For doctor visits, patients may have 1 support person aged 55 or older with them. For treatment visits, patients cannot have anyone with them due to current Covid guidelines and our immunocompromised population.

## 2021-01-25 ENCOUNTER — Other Ambulatory Visit: Payer: Self-pay

## 2021-01-25 ENCOUNTER — Other Ambulatory Visit (HOSPITAL_COMMUNITY): Payer: Self-pay

## 2021-01-25 ENCOUNTER — Telehealth: Payer: Self-pay | Admitting: Cardiology

## 2021-01-25 ENCOUNTER — Ambulatory Visit
Admission: RE | Admit: 2021-01-25 | Discharge: 2021-01-25 | Disposition: A | Discharge status: Home / Self Care | Payer: Self-pay | Source: Ambulatory Visit | Attending: Radiation Oncology | Admitting: Radiation Oncology

## 2021-01-25 NOTE — Telephone Encounter (Signed)
Patient calling the office for samples of medication:   1.  What medication and dosage are you requesting samples for? apixaban (ELIQUIS) 5 MG TABS tablet  2.  Are you currently out of this medication? Yes    

## 2021-01-25 NOTE — Telephone Encounter (Signed)
Spoke with pt and her family and advised that we can not continue to give medication samples. They have been advised to do pt assistance multiple times but have never completed. Samples have been given multiple times. Pt is now asking for samples to get thorough November when she returns to the [place]. Advised that we have asked social work to assist.

## 2021-01-26 ENCOUNTER — Other Ambulatory Visit: Payer: Self-pay

## 2021-01-26 ENCOUNTER — Encounter: Payer: Self-pay | Admitting: Hematology and Oncology

## 2021-01-26 ENCOUNTER — Ambulatory Visit
Admission: RE | Admit: 2021-01-26 | Discharge: 2021-01-26 | Disposition: A | Discharge status: Home / Self Care | Payer: Self-pay | Source: Ambulatory Visit | Attending: Radiation Oncology | Admitting: Radiation Oncology

## 2021-01-27 ENCOUNTER — Ambulatory Visit
Admission: RE | Admit: 2021-01-27 | Discharge: 2021-01-27 | Disposition: A | Discharge status: Home / Self Care | Payer: Self-pay | Source: Ambulatory Visit | Attending: Radiation Oncology | Admitting: Radiation Oncology

## 2021-01-27 ENCOUNTER — Other Ambulatory Visit: Payer: Self-pay

## 2021-01-28 ENCOUNTER — Other Ambulatory Visit: Payer: Self-pay | Admitting: Radiology

## 2021-01-28 ENCOUNTER — Ambulatory Visit
Admission: RE | Admit: 2021-01-28 | Discharge: 2021-01-28 | Disposition: A | Discharge status: Home / Self Care | Payer: Self-pay | Source: Ambulatory Visit | Attending: Radiation Oncology | Admitting: Radiation Oncology

## 2021-01-28 DIAGNOSIS — Z510 Encounter for antineoplastic radiation therapy: Secondary | ICD-10-CM | POA: Insufficient documentation

## 2021-01-28 DIAGNOSIS — Z170 Estrogen receptor positive status [ER+]: Secondary | ICD-10-CM | POA: Insufficient documentation

## 2021-01-28 DIAGNOSIS — C50112 Malignant neoplasm of central portion of left female breast: Secondary | ICD-10-CM | POA: Insufficient documentation

## 2021-01-29 ENCOUNTER — Ambulatory Visit (HOSPITAL_COMMUNITY)
Admission: RE | Admit: 2021-01-29 | Discharge: 2021-01-29 | Disposition: A | Discharge status: Home / Self Care | Payer: Self-pay | Source: Ambulatory Visit | Attending: Hematology and Oncology | Admitting: Hematology and Oncology

## 2021-01-29 ENCOUNTER — Ambulatory Visit
Admission: RE | Admit: 2021-01-29 | Discharge: 2021-01-29 | Disposition: A | Discharge status: Home / Self Care | Payer: Self-pay | Source: Ambulatory Visit | Attending: Radiation Oncology | Admitting: Radiation Oncology

## 2021-01-29 ENCOUNTER — Encounter (HOSPITAL_COMMUNITY): Payer: Self-pay

## 2021-01-29 ENCOUNTER — Other Ambulatory Visit: Payer: Self-pay

## 2021-01-29 ENCOUNTER — Ambulatory Visit: Payer: Self-pay | Admitting: Radiation Oncology

## 2021-01-29 DIAGNOSIS — Z452 Encounter for adjustment and management of vascular access device: Secondary | ICD-10-CM | POA: Insufficient documentation

## 2021-01-29 DIAGNOSIS — Z853 Personal history of malignant neoplasm of breast: Secondary | ICD-10-CM | POA: Insufficient documentation

## 2021-01-29 DIAGNOSIS — Z79899 Other long term (current) drug therapy: Secondary | ICD-10-CM | POA: Insufficient documentation

## 2021-01-29 DIAGNOSIS — Z7901 Long term (current) use of anticoagulants: Secondary | ICD-10-CM | POA: Insufficient documentation

## 2021-01-29 DIAGNOSIS — Z170 Estrogen receptor positive status [ER+]: Secondary | ICD-10-CM

## 2021-01-29 HISTORY — PX: IR REMOVAL TUN ACCESS W/ PORT W/O FL MOD SED: IMG2290

## 2021-01-29 MED ORDER — LIDOCAINE HCL (PF) 1 % IJ SOLN
INTRAMUSCULAR | Status: AC
  Filled 2021-01-29: qty 30

## 2021-01-29 MED ORDER — LIDOCAINE HCL 1 % IJ SOLN
INTRAMUSCULAR | Status: AC | PRN
  Administered 2021-01-29: 10 mL

## 2021-01-29 MED ORDER — MIDAZOLAM HCL 2 MG/2ML IJ SOLN
INTRAMUSCULAR | Status: AC
  Filled 2021-01-29: qty 2

## 2021-01-29 MED ORDER — FENTANYL CITRATE (PF) 100 MCG/2ML IJ SOLN
INTRAMUSCULAR | Status: AC | PRN
  Administered 2021-01-29: 50 ug via INTRAVENOUS

## 2021-01-29 MED ORDER — MIDAZOLAM HCL 2 MG/2ML IJ SOLN
INTRAMUSCULAR | Status: AC | PRN
  Administered 2021-01-29: 1 mg via INTRAVENOUS

## 2021-01-29 MED ORDER — FENTANYL CITRATE (PF) 100 MCG/2ML IJ SOLN
INTRAMUSCULAR | Status: AC
  Filled 2021-01-29: qty 2

## 2021-01-29 NOTE — Discharge Instructions (Addendum)
Please call Interventional Radiology clinic [phone] with any questions or concerns.  You may remove your dressing and shower tomorrow.   Implanted Port Removal, Care After This sheet gives you information about how to care for yourself after your procedure. Your health care provider may also give you more specific instructions. If you have problems or questions, contact your health care provider. What can I expect after the procedure? After the procedure, it is common to have: Soreness or pain near your incision. Some swelling or bruising near your incision. Follow these instructions at home: Medicines Take over-the-counter and prescription medicines only as told by your health care provider. If you were prescribed an antibiotic medicine, take it as told by your health care provider. Do not stop taking the antibiotic even if you start to feel better. Bathing Do not take baths, swim, or use a hot tub until your health care provider approves. Ask your health care provider if you can take showers. You may only be allowed to take sponge baths. Incision care Follow instructions from your health care provider about how to take care of your incision. Make sure you: Wash your hands with soap and water before you change your bandage (dressing). If soap and water are not available, use hand sanitizer. Change your dressing as told by your health care provider. Keep your dressing dry. Leave stitches (sutures), skin glue, or adhesive strips in place. These skin closures may need to stay in place for 2 weeks or longer. If adhesive strip edges start to loosen and curl up, you may trim the loose edges. Do not remove adhesive strips completely unless your health care provider tells you to do that. Check your incision area every day for signs of infection. Check for: More redness, swelling, or pain. More fluid or blood. Warmth. Pus or a bad smell.    Driving Do not drive for 24 hours if you were  given a medicine to help you relax (sedative) during your procedure. If you did not receive a sedative, ask your health care provider when it is safe to drive.    Activity Return to your normal activities as told by your health care provider. Ask your health care provider what activities are safe for you. Do not lift anything that is heavier than 10 lb (4.5 kg), or the limit that you are told, until your health care provider says that it is safe. Do not do activities that involve lifting your arms over your head. General instructions Do not use any products that contain nicotine or tobacco, such as cigarettes and e-cigarettes. These can delay healing. If you need help quitting, ask your health care provider. Keep all follow-up visits as told by your health care provider. This is important. Contact a health care provider if: You have more redness, swelling, or pain around your incision. You have more fluid or blood coming from your incision. Your incision feels warm to the touch. You have pus or a bad smell coming from your incision. You have pain that is not relieved by your pain medicine. Get help right away if you have: A fever or chills. Chest pain. Difficulty breathing. Summary After the procedure, it is common to have pain, soreness, swelling, or bruising near your incision. If you were prescribed an antibiotic medicine, take it as told by your health care provider. Do not stop taking the antibiotic even if you start to feel better. Do not drive for 24 hours if you were given a sedative during   your procedure. Return to your normal activities as told by your health care provider. Ask your health care provider what activities are safe for you. This information is not intended to replace advice given to you by your health care provider. Make sure you discuss any questions you have with your health care provider.    Moderate Conscious Sedation, Adult, Care After This sheet gives you  information about how to care for yourself after your procedure. Your health care provider may also give you more specific instructions. If you have problems or questions, contact your health care provider. What can I expect after the procedure? After the procedure, it is common to have: Sleepiness for several hours. Impaired judgment for several hours. Difficulty with balance. Vomiting if you eat too soon. Follow these instructions at home: For the time period you were told by your health care provider: Rest. Do not participate in activities where you could fall or become injured. Do not drive or use machinery. Do not drink alcohol. Do not take sleeping pills or medicines that cause drowsiness. Do not make important decisions or sign legal documents. Do not take care of children on your own.        Eating and drinking Follow the diet recommended by your health care provider. Drink enough fluid to keep your urine pale yellow. If you vomit: Drink water, juice, or soup when you can drink without vomiting. Make sure you have little or no nausea before eating solid foods.    General instructions Take over-the-counter and prescription medicines only as told by your health care provider. Have a responsible adult stay with you for the time you are told. It is important to have someone help care for you until you are awake and alert. Do not smoke. Keep all follow-up visits as told by your health care provider. This is important. Contact a health care provider if: You are still sleepy or having trouble with balance after 24 hours. You feel light-headed. You keep feeling nauseous or you keep vomiting. You develop a rash. You have a fever. You have redness or swelling around the IV site. Get help right away if: You have trouble breathing. You have new-onset confusion at home. Summary After the procedure, it is common to feel sleepy, have impaired judgment, or feel nauseous if you eat  too soon. Rest after you get home. Know the things you should not do after the procedure. Follow the diet recommended by your health care provider and drink enough fluid to keep your urine pale yellow. Get help right away if you have trouble breathing or new-onset confusion at home. This information is not intended to replace advice given to you by your health care provider. Make sure you discuss any questions you have with your health care provider. Document Revised: 09/13/2019 Document Reviewed: 04/11/2019 Elsevier Patient Education  2021 [employer].

## 2021-01-29 NOTE — H&P (Signed)
Chief Complaint: Patient was seen in consultation today for port removal at the request of [place]  Referring Physician(s): [place] [facility]  Supervising Physician: Aletta Edouard  Patient Status: [facility] - Out-pt  History of Present Illness: Courtney Coleman is a 55 y.o. female with hx of breast cancer. She had port placed on 05/06/20 and has now completed treatment.  She is referred for port removal. PMHx, meds, labs, imaging, allergies reviewed. Feels well, no recent fevers, chills, illness. Has been NPO today as directed.   Past Medical History:  Diagnosis Date   Acute blood loss anemia    Alteration of sensations, post-stroke    Anemia 03/28/2020   Benign essential HTN    Bradycardia    Breast cancer ([place])    Cerebral embolism with cerebral infarction 03/28/2020   CHF (congestive heart failure) (HCC)    Controlled type 2 diabetes mellitus with hyperglycemia, without long-term current use of insulin (HCC)    CVA (cerebral vascular accident) ([place]) 03/29/2020   Dysesthesia    Dysrhythmia    Essential hypertension    Headache    migraines   Hypertension    Malignant neoplasm of central portion of left breast in female, estrogen receptor positive (HCC)    PAF (paroxysmal atrial fibrillation) ([place]) 03/28/2020   Port-A-Cath in place 05/14/2020   Right foot drop 03/28/2020   Right hemiparesis (HCC)    Slow transit constipation    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    Past Surgical History:  Procedure Laterality Date   BREAST BIOPSY     BREAST LUMPECTOMY WITH RADIOACTIVE SEED AND SENTINEL LYMPH NODE BIOPSY Left 11/25/2020   Procedure: LEFT BREAST LUMPECTOMY WITH RADIOACTIVE SEED AND SENTINEL LYMPH NODE BIOPSY;  Surgeon: Jovita Kussmaul, MD;  Location: [facility];  Service: General;  Laterality: Left;   CARDIAC CATHETERIZATION  01/2020   [facility]   IR IMAGING GUIDED PORT INSERTION  05/06/2020   NODE DISSECTION Left 11/25/2020   Procedure:  TARGETED NODE DISSECTION;  Surgeon: Jovita Kussmaul, MD;  Location: [facility];  Service: General;  Laterality: Left;    Allergies: Patient has no known allergies.  Medications: Prior to Admission medications   Medication Sig Start Date End Date Taking? Authorizing Provider  acetaminophen (TYLENOL) 500 MG tablet Take 1,000 mg by mouth every 6 (six) hours as needed for mild pain.   Yes [provider]  amiodarone (PACERONE) 200 MG tablet Take 1/2 tablet (100 mg total) by mouth daily. 01/22/21  Yes Nicholas Lose, MD  amLODipine (NORVASC) 5 MG tablet Take 1 and 1/2 tablets (7.5 mg total) by mouth daily. 01/22/21  Yes Nicholas Lose, MD  anastrozole (ARIMIDEX) 1 MG tablet Take 1 tablet (1 mg total) by mouth daily. 01/22/21  Yes Nicholas Lose, MD  apixaban (ELIQUIS) 5 MG TABS tablet Take 1 tablet (5 mg total) by mouth 2 (two) times daily. 01/22/21  Yes Nicholas Lose, MD  atorvastatin (LIPITOR) 40 MG tablet Take 1 tablet (40 mg total) by mouth daily. 01/22/21  Yes Nicholas Lose, MD  hydrALAZINE (APRESOLINE) 10 MG tablet Take 1 tablet (10 mg total) by mouth every 8 (eight) hours. 01/22/21  Yes Nicholas Lose, MD  potassium chloride SA (KLOR-CON) 20 MEQ tablet Take 1 tablet (20 mEq total) by mouth daily. 01/22/21  Yes Nicholas Lose, MD     Family History  Problem Relation Age of Onset   Heart disease Mother    Diabetes Father    Cancer Neg Hx  Social History   Socioeconomic History   Marital status: Married    Spouse name: Not on file   Number of children: Not on file   Years of education: Not on file   Highest education level: Not on file  Occupational History   Not on file  Tobacco Use   Smoking status: Never   Smokeless tobacco: Never  Vaping Use   Vaping Use: Never used  Substance and Sexual Activity   Alcohol use: Not Currently   Drug use: Never   Sexual activity: Not on file  Other Topics Concern   Not on file  Social History Narrative   Not on file   Social Determinants  of Health   Financial Resource Strain: Not on file  Food Insecurity: Not on file  Transportation Needs: Not on file  Physical Activity: Not on file  Stress: Not on file  Social Connections: Not on file    Review of Systems: A 12 point ROS discussed and pertinent positives are indicated in the HPI above.  All other systems are negative.  Review of Systems  Vital Signs: BP (!) 164/86   Pulse 67   Temp 97.9 F (36.6 C) (Oral)   Resp 18   Ht '5\' 5"'$  (1.651 m)   Wt 103.4 kg   SpO2 100%   BMI 37.94 kg/m   Physical Exam Constitutional:      Appearance: Normal appearance.  HENT:     Mouth/Throat:     Mouth: Mucous membranes are moist.     Pharynx: Oropharynx is clear.  Cardiovascular:     Rate and Rhythm: Normal rate and regular rhythm.     Heart sounds: Normal heart sounds.  Pulmonary:     Effort: Pulmonary effort is normal. No respiratory distress.     Breath sounds: Normal breath sounds.  Abdominal:     General: Abdomen is flat.     Palpations: Abdomen is soft.  Skin:    General: Skin is warm and dry.     Comments: (R)chest port site well healed  Neurological:     General: No focal deficit present.     Mental Status: She is alert and oriented to person, place, and time.  Psychiatric:        Mood and Affect: Mood normal.        Thought Content: Thought content normal.        Judgment: Judgment normal.     Imaging: No results found.  Labs:  CBC: Recent Labs    11/19/20 0815 12/11/20 0929 01/01/21 0955 01/22/21 0836  WBC 6.7 6.2 5.5 4.5  HGB 11.3* 11.0* 10.9* 10.9*  HCT 34.8* 33.0* 32.6* 33.3*  PLT 218 224 205 198    COAGS: Recent Labs    03/27/20 1831 05/06/20 1315  INR 1.2 1.2  APTT 29  --     BMP: Recent Labs    11/19/20 0815 12/11/20 0929 01/01/21 0955 01/22/21 0836  NA 141 143 140 142  K 3.4* 3.6 3.5 3.1*  CL 105 105 104 105  CO2 '27 29 26 27  '$ GLUCOSE 112* 110* 110* 148*  BUN '12 12 11 8  '$ CALCIUM 9.1 9.2 9.1 9.0  CREATININE 0.70  0.67 0.64 0.72  GFRNONAA >60 >60 >60 >60    LIVER FUNCTION TESTS: Recent Labs    11/19/20 0815 12/11/20 0929 01/01/21 0955 01/22/21 0836  BILITOT 1.0 0.7 1.0 0.9  AST '15 16 17 17  '$ ALT '10 11 12 15  '$ ALKPHOS 85  91 90 98  PROT 7.7 7.9 7.6 7.7  ALBUMIN 3.3* 3.4* 3.4* 3.5    TUMOR MARKERS: No results for input(s): AFPTM, CEA, CA199, CHROMGRNA in the last 8760 hours.  Assessment and Plan: Hx breast cancer For port removal Risks and benefits of image guided port-a-catheter removal was discussed with the patient including, but not limited to bleeding, infection, pneumothorax, or fibrin sheath development and need for additional procedures.  All of the patient's questions were answered, patient is agreeable to proceed. Consent signed and in chart.   Thank you for this interesting consult.  I greatly enjoyed meeting Courtney Coleman and look forward to participating in their care.  A copy of this report was sent to the requesting provider on this date.  Electronically Signed: Ascencion Dike, PA-C 01/29/2021, 1:18 PM   I spent a total of 20 minutes in face to face in clinical consultation, greater than 50% of which was counseling/coordinating care for port removal

## 2021-01-29 NOTE — Procedures (Signed)
Interventional Radiology Procedure Note  Procedure: Port removal  Complications: None  Estimated Blood Loss: < 10 mL  Findings: Right chest port removed utilizing sharp and blunt dissection. Wound closed.  Venetia Night. Kathlene Cote, M.D Pager:  [phone]

## 2021-02-02 ENCOUNTER — Other Ambulatory Visit: Payer: Self-pay

## 2021-02-02 ENCOUNTER — Ambulatory Visit
Admission: RE | Admit: 2021-02-02 | Discharge: 2021-02-02 | Disposition: A | Discharge status: Home / Self Care | Payer: Self-pay | Source: Ambulatory Visit | Attending: Radiation Oncology | Admitting: Radiation Oncology

## 2021-02-03 ENCOUNTER — Ambulatory Visit
Admission: RE | Admit: 2021-02-03 | Discharge: 2021-02-03 | Disposition: A | Discharge status: Home / Self Care | Payer: Self-pay | Source: Ambulatory Visit | Attending: Radiation Oncology | Admitting: Radiation Oncology

## 2021-02-04 ENCOUNTER — Other Ambulatory Visit (HOSPITAL_COMMUNITY): Payer: Self-pay

## 2021-02-04 ENCOUNTER — Ambulatory Visit
Admission: RE | Admit: 2021-02-04 | Discharge: 2021-02-04 | Disposition: A | Discharge status: Home / Self Care | Payer: Self-pay | Source: Ambulatory Visit | Attending: Radiation Oncology | Admitting: Radiation Oncology

## 2021-02-04 ENCOUNTER — Other Ambulatory Visit: Payer: Self-pay

## 2021-02-05 ENCOUNTER — Ambulatory Visit: Payer: Self-pay

## 2021-02-05 ENCOUNTER — Ambulatory Visit
Admission: RE | Admit: 2021-02-05 | Discharge: 2021-02-05 | Disposition: A | Discharge status: Home / Self Care | Payer: Self-pay | Source: Ambulatory Visit | Attending: Radiation Oncology | Admitting: Radiation Oncology

## 2021-02-08 ENCOUNTER — Ambulatory Visit
Admission: RE | Admit: 2021-02-08 | Discharge: 2021-02-08 | Disposition: A | Discharge status: Home / Self Care | Payer: Self-pay | Source: Ambulatory Visit | Attending: Radiation Oncology | Admitting: Radiation Oncology

## 2021-02-08 ENCOUNTER — Ambulatory Visit: Payer: Self-pay

## 2021-02-08 ENCOUNTER — Other Ambulatory Visit: Payer: Self-pay

## 2021-02-09 ENCOUNTER — Ambulatory Visit
Admission: RE | Admit: 2021-02-09 | Discharge: 2021-02-09 | Disposition: A | Discharge status: Home / Self Care | Payer: Self-pay | Source: Ambulatory Visit | Attending: Radiation Oncology | Admitting: Radiation Oncology

## 2021-02-10 ENCOUNTER — Other Ambulatory Visit: Payer: Self-pay

## 2021-02-10 ENCOUNTER — Ambulatory Visit
Admission: RE | Admit: 2021-02-10 | Discharge: 2021-02-10 | Disposition: A | Discharge status: Home / Self Care | Payer: Self-pay | Source: Ambulatory Visit | Attending: Radiation Oncology | Admitting: Radiation Oncology

## 2021-02-11 ENCOUNTER — Ambulatory Visit: Payer: Self-pay

## 2021-02-11 ENCOUNTER — Encounter: Payer: Self-pay | Admitting: Hematology and Oncology

## 2021-02-11 ENCOUNTER — Ambulatory Visit
Admission: RE | Admit: 2021-02-11 | Discharge: 2021-02-11 | Disposition: A | Discharge status: Home / Self Care | Payer: Self-pay | Source: Ambulatory Visit | Attending: Radiation Oncology | Admitting: Radiation Oncology

## 2021-02-11 ENCOUNTER — Encounter: Payer: Self-pay | Admitting: Radiation Oncology

## 2021-02-11 ENCOUNTER — Other Ambulatory Visit: Payer: Self-pay

## 2021-02-11 ENCOUNTER — Encounter: Payer: Self-pay | Admitting: *Deleted

## 2021-02-11 NOTE — Progress Notes (Signed)
  Radiation Oncology         [phone]) [phone] ________________________________  Name: Courtney Coleman MRN: [id number]  Date: 01/20/2021  DOB: [birth year]  SIMULATION NOTE   NARRATIVE:  The patient underwent simulation today for ongoing radiation therapy.  The existing CT study set was employed for the purpose of virtual treatment planning.  The target and avoidance structures were reviewed and modified as necessary.  Treatment planning then occurred.  The radiation boost prescription was entered and confirmed.  A total of 3 complex treatment devices were fabricated in the form of multi-leaf collimators to shape radiation around the targets while maximally excluding nearby normal structures. I have requested : Isodose Plan.    PLAN:  This modified radiation beam arrangement is intended to continue the current radiation dose to an additional 10 Gy in 5 fractions for a total cumulative dose of 60.4 Gy.    ------------------------------------------------  Jodelle Gross, MD, PhD

## 2021-02-12 ENCOUNTER — Inpatient Hospital Stay: Payer: Self-pay

## 2021-02-12 ENCOUNTER — Inpatient Hospital Stay: Payer: Self-pay | Attending: Hematology and Oncology

## 2021-02-12 ENCOUNTER — Ambulatory Visit: Payer: Self-pay

## 2021-02-12 VITALS — BP 129/72 | HR 86 | Temp 98.5°F | Resp 16

## 2021-02-12 DIAGNOSIS — C50112 Malignant neoplasm of central portion of left female breast: Secondary | ICD-10-CM

## 2021-02-12 DIAGNOSIS — Z170 Estrogen receptor positive status [ER+]: Secondary | ICD-10-CM

## 2021-02-12 DIAGNOSIS — Z79899 Other long term (current) drug therapy: Secondary | ICD-10-CM | POA: Insufficient documentation

## 2021-02-12 DIAGNOSIS — Z5112 Encounter for antineoplastic immunotherapy: Secondary | ICD-10-CM | POA: Insufficient documentation

## 2021-02-12 DIAGNOSIS — C773 Secondary and unspecified malignant neoplasm of axilla and upper limb lymph nodes: Secondary | ICD-10-CM | POA: Insufficient documentation

## 2021-02-12 LAB — CMP (CANCER CENTER ONLY)
ALT: 11 U/L (ref 0–44)
AST: 17 U/L (ref 15–41)
Albumin: 3.6 g/dL (ref 3.5–5.0)
Alkaline Phosphatase: 94 U/L (ref 38–126)
Anion gap: 10 (ref 5–15)
BUN: 8 mg/dL (ref 6–20)
CO2: 26 mmol/L (ref 22–32)
Calcium: 9.3 mg/dL (ref 8.9–10.3)
Chloride: 106 mmol/L (ref 98–111)
Creatinine: 0.75 mg/dL (ref 0.44–1.00)
GFR, Estimated: >60 mL/min (ref >60)
Glucose, Bld: 120 mg/dL — ABNORMAL HIGH (ref 70–99)
Potassium: 3.3 mmol/L — ABNORMAL LOW (ref 3.5–5.1)
Sodium: 142 mmol/L (ref 135–145)
Total Bilirubin: 1 mg/dL (ref 0.3–1.2)
Total Protein: 7.7 g/dL (ref 6.5–8.1)

## 2021-02-12 LAB — CBC WITH DIFFERENTIAL (CANCER CENTER ONLY)
Abs Immature Granulocytes: 0.01 10*3/uL (ref 0.00–0.07)
Basophils Absolute: 0 10*3/uL (ref 0.0–0.1)
Basophils Relative: 0 %
Eosinophils Absolute: 0.1 10*3/uL (ref 0.0–0.5)
Eosinophils Relative: 3 %
HCT: 35.9 % — ABNORMAL LOW (ref 36.0–46.0)
Hemoglobin: 11.9 g/dL — ABNORMAL LOW (ref 12.0–15.0)
Immature Granulocytes: 0 %
Lymphocytes Relative: 19 %
Lymphs Abs: 0.8 10*3/uL (ref 0.7–4.0)
MCH: 31.6 pg (ref 26.0–34.0)
MCHC: 33.1 g/dL (ref 30.0–36.0)
MCV: 95.5 fL (ref 80.0–100.0)
Monocytes Absolute: 0.3 10*3/uL (ref 0.1–1.0)
Monocytes Relative: 8 %
Neutro Abs: 2.8 10*3/uL (ref 1.7–7.7)
Neutrophils Relative %: 70 %
Platelet Count: 215 10*3/uL (ref 150–400)
RBC: 3.76 MIL/uL — ABNORMAL LOW (ref 3.87–5.11)
RDW: 15.3 % (ref 11.5–15.5)
WBC Count: 4.1 10*3/uL (ref 4.0–10.5)
nRBC: 0 % (ref 0.0–0.2)

## 2021-02-12 MED ORDER — DIPHENHYDRAMINE HCL 25 MG PO CAPS
50.0000 mg | ORAL_CAPSULE | Freq: Once | ORAL | Status: AC
  Administered 2021-02-12: 50 mg via ORAL
  Filled 2021-02-12: qty 2

## 2021-02-12 MED ORDER — SODIUM CHLORIDE 0.9 % IV SOLN: Freq: Once | INTRAVENOUS | Status: AC

## 2021-02-12 MED ORDER — TRASTUZUMAB CHEMO 150 MG IV SOLR
600.0000 mg | Freq: Once | INTRAVENOUS | Status: AC
  Administered 2021-02-12: 600 mg via INTRAVENOUS
  Filled 2021-02-12: qty 28.57

## 2021-02-12 MED ORDER — ACETAMINOPHEN 325 MG PO TABS
650.0000 mg | ORAL_TABLET | Freq: Once | ORAL | Status: AC
  Administered 2021-02-12: 650 mg via ORAL
  Filled 2021-02-12: qty 2

## 2021-02-12 MED ORDER — SODIUM CHLORIDE 0.9 % IV SOLN
420.0000 mg | Freq: Once | INTRAVENOUS | Status: AC
  Administered 2021-02-12: 420 mg via INTRAVENOUS
  Filled 2021-02-12: qty 14

## 2021-02-12 NOTE — Patient Instructions (Signed)
[facility] ONCOLOGY  Discharge Instructions: Thank you for choosing [facility] to provide your oncology and hematology care.   If you have a lab appointment with the [facility], please go directly to the [facility] and check in at the registration area.   Wear comfortable clothing and clothing appropriate for easy access to any Portacath or PICC line.   We strive to give you quality time with your provider. You may need to reschedule your appointment if you arrive late (15 or more minutes).  Arriving late affects you and other patients whose appointments are after yours.  Also, if you miss three or more appointments without notifying the office, you may be dismissed from the clinic at the provider's discretion.      For prescription refill requests, have your pharmacy contact our office and allow 72 hours for refills to be completed.    Today you received the following chemotherapy and/or immunotherapy agents Herceptin and Perjeta      To help prevent nausea and vomiting after your treatment, we encourage you to take your nausea medication as directed.  BELOW ARE SYMPTOMS THAT SHOULD BE REPORTED IMMEDIATELY: *FEVER GREATER THAN 100.4 F (38 C) OR HIGHER *CHILLS OR SWEATING *NAUSEA AND VOMITING THAT IS NOT CONTROLLED WITH YOUR NAUSEA MEDICATION *UNUSUAL SHORTNESS OF BREATH *UNUSUAL BRUISING OR BLEEDING *URINARY PROBLEMS (pain or burning when urinating, or frequent urination) *BOWEL PROBLEMS (unusual diarrhea, constipation, pain near the anus) TENDERNESS IN MOUTH AND THROAT WITH OR WITHOUT PRESENCE OF ULCERS (sore throat, sores in mouth, or a toothache) UNUSUAL RASH, SWELLING OR PAIN  UNUSUAL VAGINAL DISCHARGE OR ITCHING   Items with * indicate a potential emergency and should be followed up as soon as possible or go to the Emergency Department if any problems should occur.  Please show the CHEMOTHERAPY ALERT CARD or IMMUNOTHERAPY ALERT CARD at  check-in to the Emergency Department and triage nurse.  Should you have questions after your visit or need to cancel or reschedule your appointment, please contact [facility]  Dept: [phone]  and follow the prompts.  Office hours are 8:00 a.m. to 4:30 p.m. Monday - Friday. Please note that voicemails left after 4:00 p.m. may not be returned until the following business day.  We are closed weekends and major holidays. You have access to a nurse at all times for urgent questions. Please call the main number to the clinic Dept: [phone] and follow the prompts.   For any non-urgent questions, you may also contact your provider using MyChart. We now offer e-Visits for anyone 46 and older to request care online for non-urgent symptoms. For details visit [email or web address].   Also download the MyChart app! Go to the app store, search "MyChart", open the app, select [facility], and log in with your MyChart username and password.  Due to Covid, a mask is required upon entering the hospital/clinic. If you do not have a mask, one will be given to you upon arrival. For doctor visits, patients may have 1 support person aged 62 or older with them. For treatment visits, patients cannot have anyone with them due to current Covid guidelines and our immunocompromised population.

## 2021-02-12 NOTE — Progress Notes (Signed)
Pt declined to stay for 30 min observation post perjeta, VSS and pt denies any complaints. Discharged in stable condition, ambulatory to lobby.

## 2021-02-15 ENCOUNTER — Encounter: Payer: Self-pay | Admitting: Hematology and Oncology

## 2021-02-15 NOTE — Progress Notes (Signed)
                                                                                                                                                             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  Intent: Curative  Radiation Treatment Dates: 12/28/2020 through 02/11/2021 Site Technique Total Dose (Gy) Dose per Fx (Gy) Completed Fx Beam Energies  Breast, Left: Breast_Lt 3D 50.4/50.4 1.8 28/28 10X  Breast, Left: Breast_Lt_SCLV 3D 50.4/50.4 1.8 28/28 6X, 10X  Breast, Left: Breast_Lt_Bst 3D 10/10 2 5/5 6X, 10X   Narrative: The patient tolerated radiation therapy relatively well. She developed fatigue and anticipated skin changes in the treatment field.   Plan: The patient will receive a call in about one month from the radiation oncology department. She will continue follow up with Dr. Lindi Adie as well.   ________________________________________________    Carola Rhine, [facility]

## 2021-03-04 ENCOUNTER — Other Ambulatory Visit: Payer: Self-pay

## 2021-03-04 ENCOUNTER — Encounter: Payer: Self-pay | Admitting: Hematology and Oncology

## 2021-03-04 ENCOUNTER — Encounter: Payer: Self-pay | Admitting: *Deleted

## 2021-03-04 ENCOUNTER — Other Ambulatory Visit (HOSPITAL_COMMUNITY): Payer: Self-pay

## 2021-03-04 NOTE — Progress Notes (Signed)
Patient Care Team: Kerin Perna, NP as PCP - General (Internal Medicine) Revankar, Reita Cliche, MD as PCP - Cardiology (Cardiology) Mauro Kaufmann, RN as Oncology Nurse Navigator Rockwell Germany, RN as Oncology Nurse Navigator Nicholas Lose, MD as Consulting Physician (Hematology and Oncology) Revankar, Reita Cliche, MD as Consulting Physician (Cardiology) Jovita Kussmaul, MD as Consulting Physician (General Surgery)  DIAGNOSIS:    ICD-10-CM   1. Malignant neoplasm of central portion of left breast in female, estrogen receptor positive ([place])  C50.112    Z17.0       SUMMARY OF ONCOLOGIC HISTORY: Oncology History  Malignant neoplasm of central portion of left breast in female, estrogen receptor positive ([place])   Initial Diagnosis   Palpable left breast mass started February 2021 (patient lives in the [place]), went to [facility] and mammogram and ultrasound 01/03/2020: 8 cm distortion and calcifications left breast 2:00 and 3 abnormal axillary lymph nodes.  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   05/15/2020 - 08/27/2020 Neo-Adjuvant Chemotherapy   TCHP neoadjuvantly x 6   09/17/2020 -  Chemotherapy      Patient is on Antibody Plan: BREAST TRASTUZUMAB + PERTUZUMAB Q21D     11/25/2020 Surgery   Left breast lumpectomy: focal fibrosis with mild inflammation and calcifications, no residual carcinoma, margins negative for carcinoma, and 3 axillary nodes negative for metastatic carcinoma     CHIEF COMPLIANT: Cycle 9 Herceptin Perjeta  INTERVAL HISTORY: Courtney Coleman is a 55 y.o. with above-mentioned history of left breast cancer having undergone lumpectomy, currently on radiation therapy and now on Herceptin and Perjeta. She presents to the clinic today for treatment.   ALLERGIES:  has No Known Allergies.  MEDICATIONS:  Current Outpatient Medications  Medication Sig  Dispense Refill   acetaminophen (TYLENOL) 500 MG tablet Take 1,000 mg by mouth every 6 (six) hours as needed for mild pain.     amiodarone (PACERONE) 200 MG tablet Take 1/2 tablet (100 mg total) by mouth daily. 15 tablet 2   amLODipine (NORVASC) 5 MG tablet Take 1 and 1/2 tablets (7.5 mg total) by mouth daily. 45 tablet 2   anastrozole (ARIMIDEX) 1 MG tablet Take 1 tablet (1 mg total) by mouth daily. 30 tablet 2   apixaban (ELIQUIS) 5 MG TABS tablet Take 1 tablet (5 mg total) by mouth 2 (two) times daily. 180 tablet 1   atorvastatin (LIPITOR) 40 MG tablet Take 1 tablet (40 mg total) by mouth daily. 30 tablet 2   hydrALAZINE (APRESOLINE) 10 MG tablet Take 1 tablet (10 mg total) by mouth every 8 (eight) hours. 90 tablet 2   potassium chloride SA (KLOR-CON) 20 MEQ tablet Take 1 tablet (20 mEq total) by mouth daily. 30 tablet 2   No current facility-administered medications for this visit.   Facility-Administered Medications Ordered in Other Visits  Medication Dose Route Frequency Provider Last Rate Last Admin   sodium chloride flush (NS) 0.9 % injection 10 mL  10 mL Intracatheter Once Nicholas Lose, MD        PHYSICAL EXAMINATION: ECOG PERFORMANCE STATUS: 1 - Symptomatic but completely ambulatory  Vitals:   03/05/21 0912  BP: (!) 150/88  Pulse: 85  Resp: 18  Temp: 97.7 F (36.5 C)  SpO2: 97%   Filed Weights   03/05/21 0912  Weight: 231 lb 9.6 oz (105.1 kg)    LABORATORY DATA:  I have reviewed the data as listed CMP Latest Ref Rng & Units 02/12/2021 01/22/2021 01/01/2021  Glucose 70 - 99 mg/dL 120(H) 148(H) 110(H)  BUN 6 - 20 mg/dL 8 8 11   Creatinine 0.44 - 1.00 mg/dL 0.75 0.72 0.64  Sodium 135 - 145 mmol/L 142 142 140  Potassium 3.5 - 5.1 mmol/L 3.3(L) 3.1(L) 3.5  Chloride 98 - 111 mmol/L 106 105 104  CO2 22 - 32 mmol/L 26 27 26   Calcium 8.9 - 10.3 mg/dL 9.3 9.0 9.1  Total Protein 6.5 - 8.1 g/dL 7.7 7.7 7.6  Total Bilirubin 0.3 - 1.2 mg/dL 1.0 0.9 1.0  Alkaline Phos 38 - 126  U/L 94 98 90  AST 15 - 41 U/L 17 17 17   ALT 0 - 44 U/L 11 15 12     Lab Results  Component Value Date   WBC 3.9 (L) 03/05/2021   HGB 11.1 (L) 03/05/2021   HCT 33.6 (L) 03/05/2021   MCV 96.6 03/05/2021   PLT 208 03/05/2021   NEUTROABS 2.3 03/05/2021    ASSESSMENT & PLAN:  Malignant neoplasm of central portion of left breast in female, estrogen receptor positive ([place]) Palpable left breast mass started February 2021 (patient lives in the [place]), went to [facility] and mammogram and ultrasound 01/03/2020: 8 cm distortion and calcifications left breast 2:00 and 3 abnormal axillary lymph nodes.  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   Treatment Plan:: 1. Neoadjuvant chemotherapy with TCH Perjeta 6 cycles followed by Herceptin Perjeta versus Kadcyla maintenance for 1 year 2. 11/25/2020 lumpectomy with the targeted node dissection: Pathologic complete response 0/3 lymph nodes negative 3. Followed by adjuvant radiation therapy completed 02/11/2021 4.  Followed by antiestrogen therapy  -------------------------------------------------------------------------------------------------------------------------------- Current Treatment: Herceptin Perjeta Maintenance Left Lumpectomy 11/25/2020: complete path response' 0/3 LN   Chemotherapy-induced anemia: Hemoglobin has improved to 10.9 Continue with HP maintenance Radiation ongoing  She would like to get the port removed because it is irritating her.  She will receive the rest of the Herceptin Perjeta treatments through a peripheral IV.  Breast cancer surveillance: 1.  She will get surveillance in [place] closer to her home.  2.  mammogram once a year in November.  Return to clinic every 3 weeks for Herceptin Perjeta and every 6 weeks with follow-up with me.      No orders of the defined types were placed in this encounter.  The patient has a good understanding of the overall plan. she agrees with it. she  will call with any problems that may develop before the next visit here.  Total time spent: 30 mins including face to face time and time spent for planning, charting and coordination of care  Rulon Eisenmenger, MD, MPH 03/05/2021  I, Thana Ates, am acting as scribe for Dr. Nicholas Lose.  I have reviewed the above documentation for accuracy and completeness, and I agree with the above.

## 2021-03-05 ENCOUNTER — Inpatient Hospital Stay: Payer: Self-pay | Attending: Hematology and Oncology

## 2021-03-05 ENCOUNTER — Other Ambulatory Visit: Payer: Self-pay

## 2021-03-05 ENCOUNTER — Inpatient Hospital Stay: Payer: Self-pay

## 2021-03-05 ENCOUNTER — Inpatient Hospital Stay (HOSPITAL_BASED_OUTPATIENT_CLINIC_OR_DEPARTMENT_OTHER): Payer: Self-pay | Admitting: Hematology and Oncology

## 2021-03-05 ENCOUNTER — Other Ambulatory Visit (HOSPITAL_COMMUNITY): Payer: Self-pay

## 2021-03-05 ENCOUNTER — Encounter: Payer: Self-pay | Admitting: Hematology and Oncology

## 2021-03-05 VITALS — BP 129/85 | HR 92 | Temp 98.8°F | Resp 18

## 2021-03-05 DIAGNOSIS — Z5112 Encounter for antineoplastic immunotherapy: Secondary | ICD-10-CM | POA: Insufficient documentation

## 2021-03-05 DIAGNOSIS — C50112 Malignant neoplasm of central portion of left female breast: Secondary | ICD-10-CM | POA: Insufficient documentation

## 2021-03-05 DIAGNOSIS — Z170 Estrogen receptor positive status [ER+]: Secondary | ICD-10-CM

## 2021-03-05 DIAGNOSIS — Z79899 Other long term (current) drug therapy: Secondary | ICD-10-CM | POA: Insufficient documentation

## 2021-03-05 LAB — CBC WITH DIFFERENTIAL (CANCER CENTER ONLY)
Abs Immature Granulocytes: 0.01 10*3/uL (ref 0.00–0.07)
Basophils Absolute: 0 10*3/uL (ref 0.0–0.1)
Basophils Relative: 0 %
Eosinophils Absolute: 0.1 10*3/uL (ref 0.0–0.5)
Eosinophils Relative: 2 %
HCT: 33.6 % — ABNORMAL LOW (ref 36.0–46.0)
Hemoglobin: 11.1 g/dL — ABNORMAL LOW (ref 12.0–15.0)
Immature Granulocytes: 0 %
Lymphocytes Relative: 31 %
Lymphs Abs: 1.2 10*3/uL (ref 0.7–4.0)
MCH: 31.9 pg (ref 26.0–34.0)
MCHC: 33 g/dL (ref 30.0–36.0)
MCV: 96.6 fL (ref 80.0–100.0)
Monocytes Absolute: 0.3 10*3/uL (ref 0.1–1.0)
Monocytes Relative: 9 %
Neutro Abs: 2.3 10*3/uL (ref 1.7–7.7)
Neutrophils Relative %: 58 %
Platelet Count: 208 10*3/uL (ref 150–400)
RBC: 3.48 MIL/uL — ABNORMAL LOW (ref 3.87–5.11)
RDW: 15.3 % (ref 11.5–15.5)
WBC Count: 3.9 10*3/uL — ABNORMAL LOW (ref 4.0–10.5)
nRBC: 0 % (ref 0.0–0.2)

## 2021-03-05 LAB — CMP (CANCER CENTER ONLY)
ALT: 12 U/L (ref 0–44)
AST: 16 U/L (ref 15–41)
Albumin: 3.4 g/dL — ABNORMAL LOW (ref 3.5–5.0)
Alkaline Phosphatase: 86 U/L (ref 38–126)
Anion gap: 9 (ref 5–15)
BUN: 10 mg/dL (ref 6–20)
CO2: 26 mmol/L (ref 22–32)
Calcium: 9 mg/dL (ref 8.9–10.3)
Chloride: 106 mmol/L (ref 98–111)
Creatinine: 0.72 mg/dL (ref 0.44–1.00)
GFR, Estimated: >60 mL/min (ref >60)
Glucose, Bld: 96 mg/dL (ref 70–99)
Potassium: 3.2 mmol/L — ABNORMAL LOW (ref 3.5–5.1)
Sodium: 141 mmol/L (ref 135–145)
Total Bilirubin: 1.3 mg/dL — ABNORMAL HIGH (ref 0.3–1.2)
Total Protein: 7.5 g/dL (ref 6.5–8.1)

## 2021-03-05 MED ORDER — SODIUM CHLORIDE 0.9% FLUSH: 10.0000 mL | Freq: Once | INTRAVENOUS | Status: AC

## 2021-03-05 MED ORDER — ACETAMINOPHEN 325 MG PO TABS
650.0000 mg | ORAL_TABLET | Freq: Once | ORAL | Status: AC
  Administered 2021-03-05: 650 mg via ORAL
  Filled 2021-03-05: qty 2

## 2021-03-05 MED ORDER — SODIUM CHLORIDE 0.9 % IV SOLN
420.0000 mg | Freq: Once | INTRAVENOUS | Status: AC
  Administered 2021-03-05: 420 mg via INTRAVENOUS
  Filled 2021-03-05: qty 14

## 2021-03-05 MED ORDER — TRASTUZUMAB CHEMO 150 MG IV SOLR
600.0000 mg | Freq: Once | INTRAVENOUS | Status: AC
  Administered 2021-03-05: 600 mg via INTRAVENOUS
  Filled 2021-03-05: qty 28.57

## 2021-03-05 MED ORDER — DIPHENHYDRAMINE HCL 25 MG PO CAPS
50.0000 mg | ORAL_CAPSULE | Freq: Once | ORAL | Status: AC
  Administered 2021-03-05: 50 mg via ORAL
  Filled 2021-03-05: qty 2

## 2021-03-05 MED ORDER — SODIUM CHLORIDE 0.9 % IV SOLN: Freq: Once | INTRAVENOUS | Status: AC

## 2021-03-05 NOTE — Assessment & Plan Note (Signed)
Palpable left breast mass started February 2021 (patient lives in the [place]), went to [facility] and mammogram and ultrasound 01/03/2020: 8 cm distortion and calcifications left breast 2:00 and 3 abnormal axillary lymph nodes. 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  Treatment Plan:: 1. Neoadjuvant chemotherapy with TCH Perjeta 6 cycles followed by HerceptinPerjeta versus Kadcylamaintenance for 1 year 2.6/29/2022lumpectomywith the targeted node dissection: Pathologic complete response 0/3 lymph nodes negative 3. Followed by adjuvant radiation therapycompleted 02/11/2021 4.Followed by antiestrogen therapy  -------------------------------------------------------------------------------------------------------------------------------- Current Treatment:Herceptin Perjeta Maintenance Left Lumpectomy6/29/2022: complete path response' 0/3 LN  Chemotherapy-induced anemia: Hemoglobin has improved to 10.9 Continue with HP maintenance Radiationongoing  She would like to get the port removed because it is irritating her. She will receive the rest of the Herceptin Perjeta treatments through a peripheral IV.  Breast cancer surveillance: 1.  She will get surveillance in [place] closer to her home.  2. mammogram once a year in November.  Return to clinic every 3 weeks for Herceptin Perjeta and every 6 weeks with follow-up with me.   

## 2021-03-05 NOTE — Patient Instructions (Signed)
[facility] ONCOLOG[facility]  Discharge Instructions: Thank you for choosing [facility] to provide your oncology and hematology care.   If you have a lab appointment with the Cancer Center, please go directly to the Cancer Center and check in at the registration area.   Wear comfortable clothing and clothing appropriate for easy access to any Portacath or PICC line.   We strive to give you quality time with your provider. You may need to reschedule your appointment if you arrive late (15 or more minutes).  Arriving late affects you and other patients whose appointments are after yours.  Also, if you miss three or more appointments without notifying the office, you may be dismissed from the clinic at the provider's discretion.      For prescription refill requests, have your pharmacy contact our office and allow 72 hours for refills to be completed.    Today you received the following chemotherapy and/or immunotherapy agents; Herceptin & Perjeta      To help prevent nausea and vomiting after your treatment, we encourage you to take your nausea medication as directed.  BELOW ARE SYMPTOMS THAT SHOULD BE REPORTED IMMEDIATELY: *FEVER GREATER THAN 100.4 F (38 C) OR HIGHER *CHILLS OR SWEATING *NAUSEA AND VOMITING THAT IS NOT CONTROLLED WITH YOUR NAUSEA MEDICATION *UNUSUAL SHORTNESS OF BREATH *UNUSUAL BRUISING OR BLEEDING *URINARY PROBLEMS (pain or burning when urinating, or frequent urination) *BOWEL PROBLEMS (unusual diarrhea, constipation, pain near the anus) TENDERNESS IN MOUTH AND THROAT WITH OR WITHOUT PRESENCE OF ULCERS (sore throat, sores in mouth, or a toothache) UNUSUAL RASH, SWELLING OR PAIN  UNUSUAL VAGINAL DISCHARGE OR ITCHING   Items with * indicate a potential emergency and should be followed up as soon as possible or go to the Emergency Department if any problems should occur.  Please show the CHEMOTHERAPY ALERT CARD or IMMUNOTHERAPY ALERT CARD at  check-in to the Emergency Department and triage nurse.  Should you have questions after your visit or need to cancel or reschedule your appointment, please contact [facility]  Dept: [phone]  and follow the prompts.  Office hours are 8:00 a.m. to 4:30 p.m. Monday - Friday. Please note that voicemails left after 4:00 p.m. may not be returned until the following business day.  We are closed weekends and major holidays. You have access to a nurse at all times for urgent questions. Please call the main number to the clinic Dept: [phone] and follow the prompts.   For any non-urgent questions, you may also contact your provider using MyChart. We now offer e-Visits for anyone 18 and older to request care online for non-urgent symptoms. For details visit [email or web address].   Also download the MyChart app! Go to the app store, search "MyChart", open the app, select [facility], and log in with your MyChart username and password.  Due to Covid, a mask is required upon entering the hospital/clinic. If you do not have a mask, one will be given to you upon arrival. For doctor visits, patients may have 1 support person aged 18 or older with them. For treatment visits, patients cannot have anyone with them due to current Covid guidelines and our immunocompromised population.  

## 2021-03-11 ENCOUNTER — Encounter: Payer: Self-pay | Admitting: *Deleted

## 2021-03-15 ENCOUNTER — Ambulatory Visit
Admission: RE | Admit: 2021-03-15 | Discharge: 2021-03-15 | Disposition: A | Discharge status: Home / Self Care | Payer: Self-pay | Source: Ambulatory Visit | Attending: Radiation Oncology | Admitting: Radiation Oncology

## 2021-03-15 DIAGNOSIS — Z170 Estrogen receptor positive status [ER+]: Secondary | ICD-10-CM

## 2021-03-15 DIAGNOSIS — C50112 Malignant neoplasm of central portion of left female breast: Secondary | ICD-10-CM

## 2021-03-15 NOTE — Progress Notes (Signed)
  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  Interval Since Last Radiation:  5 weeks   12/28/2020 through 02/11/2021 Site Technique Total Dose (Gy) Dose per Fx (Gy) Completed Fx Beam Energies  Breast, Left: Breast_Lt 3D 50.4/50.4 1.8 28/28 10X  Breast, Left: Breast_Lt_SCLV 3D 50.4/50.4 1.8 28/28 6X, 10X  Breast, Left: Breast_Lt_Bst 3D 10/10 2 5/5 6X, 10X    Narrative:  The patient was contacted today for routine follow-up. During treatment she did very well with radiotherapy and did not have significant desquamation. She reports she is doing well and her skin is almost back to normal.  Impression/Plan: 1. 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 The patient has been doing well since completion of radiotherapy. We discussed that we would be happy to continue to follow her as needed, but she will also continue to follow up with Dr. Lindi Adie in medical oncology. She was counseled on skin care as well as measures to avoid sun exposure to this area.  2. Survivorship. We discussed the importance of survivorship evaluation and encouraged her to attend her upcoming visit with that clinic.      Carola Rhine, PAC

## 2021-03-26 ENCOUNTER — Ambulatory Visit: Payer: Self-pay

## 2021-04-14 NOTE — Progress Notes (Signed)
Patient Care Team: Kerin Perna, NP as PCP - General (Internal Medicine) Revankar, Reita Cliche, MD as PCP - Cardiology (Cardiology) Mauro Kaufmann, RN as Oncology Nurse Navigator Rockwell Germany, RN as Oncology Nurse Navigator Nicholas Lose, MD as Consulting Physician (Hematology and Oncology) Revankar, Reita Cliche, MD as Consulting Physician (Cardiology) Jovita Kussmaul, MD as Consulting Physician (General Surgery)  DIAGNOSIS:    ICD-10-CM   1. Malignant neoplasm of central portion of left breast in female, estrogen receptor positive ([place])  C50.112    Z17.0       SUMMARY OF ONCOLOGIC HISTORY: Oncology History  Malignant neoplasm of central portion of left breast in female, estrogen receptor positive ([place])   Initial Diagnosis   Palpable left breast mass started February 2021 (patient lives in the [place]), went to [place] and mammogram and ultrasound 01/03/2020: 8 cm distortion and calcifications left breast 2:00 and 3 abnormal axillary lymph nodes.  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 with above-mentioned history of left breast cancer having undergone lumpectomy, currently on radiation therapy and now on Herceptin and Perjeta. She presents to the clinic today for treatment.  She has been tolerating Herceptin and Perjeta extremely well.  Today is her last treatment.   She gets around with the help of her walker.   She would like to receive 61-monthsupply of her medications.  ALLERGIES:  has No Known Allergies.  MEDICATIONS:  Current Outpatient Medications  Medication Sig Dispense Refill   amiodarone (PACERONE) 200 MG tablet Take 1/2 tablet (100 mg total) by mouth daily. 15 tablet 2   amLODipine (NORVASC) 5 MG tablet Take 1 and 1/2 tablets (7.5 mg total) by mouth daily. 45 tablet 2   anastrozole (ARIMIDEX) 1 MG tablet Take 1 tablet (1 mg total) by mouth daily. 30 tablet 2   apixaban (ELIQUIS) 5 MG TABS tablet Take 1 tablet (5 mg total) by mouth 2 (two) times daily. 180 tablet 1   atorvastatin (LIPITOR) 40 MG tablet Take 1 tablet (40 mg total) by mouth daily. 30 tablet 2   hydrALAZINE (APRESOLINE) 10 MG tablet Take 1 tablet (10 mg total) by mouth every 8 (eight) hours. 90 tablet 2   No current facility-administered medications for this visit.   Facility-Administered Medications Ordered in Other Visits  Medication Dose Route Frequency Provider Last Rate Last Admin   sodium chloride flush (NS) 0.9 % injection 10 mL  10 mL Intracatheter Once GNicholas Lose MD        PHYSICAL EXAMINATION: ECOG PERFORMANCE STATUS: 1 - Symptomatic but completely ambulatory  Vitals:   04/15/21 0759  BP: (!) 153/77  Pulse: (!) 104  Resp: 18  Temp: 98.1 F (36.7 C)  SpO2: 99%   Filed Weights   04/15/21 0759  Weight: 232 lb 1.6 oz (105.3 kg)    BREAST: No palpable masses or nodules in either  right or left breasts. No palpable axillary supraclavicular or infraclavicular adenopathy no breast tenderness or nipple discharge. (exam performed in the presence of a chaperone)  LABORATORY DATA:  I have reviewed the data as listed CMP Latest Ref Rng & Units 03/05/2021 02/12/2021 01/22/2021  Glucose 70 - 99 mg/dL 96 120(H) 148(H)  BUN 6 - 20 mg/dL 10 8 8   Creatinine 0.44 - 1.00 mg/dL 0.72 0.75 0.72  Sodium 135 - 145 mmol/L 141 142 142  Potassium 3.5 - 5.1 mmol/L 3.2(L) 3.3(L)  3.1(L)  Chloride 98 - 111 mmol/L 106 106 105  CO2 22 - 32 mmol/L 26 26 27   Calcium 8.9 - 10.3 mg/dL 9.0 9.3 9.0  Total Protein 6.5 - 8.1 g/dL 7.5 7.7 7.7  Total Bilirubin 0.3 - 1.2 mg/dL 1.3(H) 1.0 0.9  Alkaline Phos 38 - 126 U/L 86 94 98  AST 15 - 41 U/L 16 17 17   ALT 0 - 44 U/L 12 11 15     Lab Results  Component Value Date   WBC 3.9 (L) 03/05/2021   HGB 11.1 (L) 03/05/2021   HCT 33.6 (L) 03/05/2021   MCV 96.6 03/05/2021   PLT 208 03/05/2021   NEUTROABS 2.3 03/05/2021    ASSESSMENT & PLAN:  Malignant neoplasm of central portion of left breast in female, estrogen receptor positive ([place]) Palpable left breast mass started February 2021 (patient lives in the [place]), went to [place] and mammogram and ultrasound 01/03/2020: 8 cm distortion and calcifications left breast 2:00 and 3 abnormal axillary lymph nodes.  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   Treatment Plan:: 1. Neoadjuvant chemotherapy with TCH Perjeta 6 cycles followed by Herceptin Perjeta versus Kadcyla maintenance for 1 year 2. 11/25/2020 lumpectomy with the targeted node dissection: Pathologic complete response 0/3 lymph nodes negative 3. Followed by adjuvant radiation therapy completed 02/11/2021 4.  Followed by antiestrogen therapy  -------------------------------------------------------------------------------------------------------------------------------- Current Treatment: Herceptin Perjeta Maintenance Left Lumpectomy 11/25/2020: complete path response' 0/3 LN   Chemotherapy-induced anemia: Hemoglobin has improved to 10.9 Continue with HP maintenance Radiation completed 02/11/2021   Breast cancer surveillance: 1.  She will get surveillance in [place] closer to her home.  2.  mammogram once a year in November.   This concludes her treatment today. Return to clinic on an as-needed basis. Patient will be flying to [place] the Saturday and will find a local physician to monitor  her disease with mammograms and breast exams.      No orders of the defined types were placed in this encounter.  The patient has a good understanding of the overall plan. she agrees with it. she will call with any problems that may develop before the next visit here.  Total time spent: 30 mins including face to face time and time spent for planning, charting and coordination of care  Rulon Eisenmenger, MD, MPH 04/15/2021  I, Thana Ates, am acting as scribe for Dr. Nicholas Lose.  I have reviewed the above documentation for accuracy and completeness, and I agree with the above.

## 2021-04-15 ENCOUNTER — Other Ambulatory Visit: Payer: Self-pay

## 2021-04-15 ENCOUNTER — Encounter: Payer: Self-pay | Admitting: *Deleted

## 2021-04-15 ENCOUNTER — Other Ambulatory Visit (HOSPITAL_COMMUNITY): Payer: Self-pay

## 2021-04-15 ENCOUNTER — Encounter: Payer: Self-pay | Admitting: Hematology and Oncology

## 2021-04-15 ENCOUNTER — Inpatient Hospital Stay: Payer: Self-pay

## 2021-04-15 ENCOUNTER — Inpatient Hospital Stay: Payer: Self-pay | Attending: Hematology and Oncology | Admitting: Hematology and Oncology

## 2021-04-15 VITALS — HR 54

## 2021-04-15 DIAGNOSIS — Z170 Estrogen receptor positive status [ER+]: Secondary | ICD-10-CM

## 2021-04-15 DIAGNOSIS — Z5112 Encounter for antineoplastic immunotherapy: Secondary | ICD-10-CM | POA: Insufficient documentation

## 2021-04-15 DIAGNOSIS — C50112 Malignant neoplasm of central portion of left female breast: Secondary | ICD-10-CM | POA: Insufficient documentation

## 2021-04-15 DIAGNOSIS — Z79899 Other long term (current) drug therapy: Secondary | ICD-10-CM | POA: Insufficient documentation

## 2021-04-15 MED ORDER — DIPHENHYDRAMINE HCL 25 MG PO CAPS
50.0000 mg | ORAL_CAPSULE | Freq: Once | ORAL | Status: AC
  Administered 2021-04-15: 09:00:00 50 mg via ORAL
  Filled 2021-04-15: qty 2

## 2021-04-15 MED ORDER — AMIODARONE HCL 200 MG PO TABS
100.0000 mg | ORAL_TABLET | Freq: Every day | ORAL | 2 refills | Status: AC
  Filled 2021-04-15: qty 45, 90d supply, fill #0

## 2021-04-15 MED ORDER — TRASTUZUMAB CHEMO 150 MG IV SOLR
600.0000 mg | Freq: Once | INTRAVENOUS | Status: AC
  Administered 2021-04-15: 600 mg via INTRAVENOUS
  Filled 2021-04-15: qty 28.57

## 2021-04-15 MED ORDER — ANASTROZOLE 1 MG PO TABS
1.0000 mg | ORAL_TABLET | Freq: Every day | ORAL | 3 refills | Status: AC
  Filled 2021-04-15: qty 90, 90d supply, fill #0

## 2021-04-15 MED ORDER — ACETAMINOPHEN 325 MG PO TABS
650.0000 mg | ORAL_TABLET | Freq: Once | ORAL | Status: AC
  Administered 2021-04-15: 09:00:00 650 mg via ORAL
  Filled 2021-04-15: qty 2

## 2021-04-15 MED ORDER — SODIUM CHLORIDE 0.9 % IV SOLN
420.0000 mg | Freq: Once | INTRAVENOUS | Status: AC
  Administered 2021-04-15: 420 mg via INTRAVENOUS
  Filled 2021-04-15: qty 14

## 2021-04-15 MED ORDER — SODIUM CHLORIDE 0.9 % IV SOLN: Freq: Once | INTRAVENOUS | Status: AC

## 2021-04-15 MED ORDER — ATORVASTATIN CALCIUM 40 MG PO TABS
40.0000 mg | ORAL_TABLET | Freq: Every day | ORAL | 2 refills | Status: AC
  Filled 2021-04-15 (×2): qty 90, 90d supply, fill #0

## 2021-04-15 MED ORDER — APIXABAN 5 MG PO TABS
5.0000 mg | ORAL_TABLET | Freq: Two times a day (BID) | ORAL | 1 refills | Status: AC
  Filled 2021-04-15: qty 180, 90d supply, fill #0
  Filled 2021-04-15: qty 60, 30d supply, fill #0

## 2021-04-15 NOTE — Progress Notes (Signed)
Ok to proceed with Echo from June per Dr Lindi Adie

## 2021-04-15 NOTE — Patient Instructions (Signed)
[facility] ONCOLOGY  Discharge Instructions: Thank you for choosing [facility] to provide your oncology and hematology care.   If you have a lab appointment with the [facility], please go directly to the [facility] and check in at the registration area.   Wear comfortable clothing and clothing appropriate for easy access to any Portacath or PICC line.   We strive to give you quality time with your provider. You may need to reschedule your appointment if you arrive late (15 or more minutes).  Arriving late affects you and other patients whose appointments are after yours.  Also, if you miss three or more appointments without notifying the office, you may be dismissed from the clinic at the provider's discretion.      For prescription refill requests, have your pharmacy contact our office and allow 72 hours for refills to be completed.    Today you received the following chemotherapy and/or immunotherapy agents: trastuzumab and pertuzumab      To help prevent nausea and vomiting after your treatment, we encourage you to take your nausea medication as directed.  BELOW ARE SYMPTOMS THAT SHOULD BE REPORTED IMMEDIATELY: *FEVER GREATER THAN 100.4 F (38 C) OR HIGHER *CHILLS OR SWEATING *NAUSEA AND VOMITING THAT IS NOT CONTROLLED WITH YOUR NAUSEA MEDICATION *UNUSUAL SHORTNESS OF BREATH *UNUSUAL BRUISING OR BLEEDING *URINARY PROBLEMS (pain or burning when urinating, or frequent urination) *BOWEL PROBLEMS (unusual diarrhea, constipation, pain near the anus) TENDERNESS IN MOUTH AND THROAT WITH OR WITHOUT PRESENCE OF ULCERS (sore throat, sores in mouth, or a toothache) UNUSUAL RASH, SWELLING OR PAIN  UNUSUAL VAGINAL DISCHARGE OR ITCHING   Items with * indicate a potential emergency and should be followed up as soon as possible or go to the Emergency Department if any problems should occur.  Please show the CHEMOTHERAPY ALERT CARD or IMMUNOTHERAPY ALERT  CARD at check-in to the Emergency Department and triage nurse.  Should you have questions after your visit or need to cancel or reschedule your appointment, please contact [facility]  Dept: [phone]  and follow the prompts.  Office hours are 8:00 a.m. to 4:30 p.m. Monday - Friday. Please note that voicemails left after 4:00 p.m. may not be returned until the following business day.  We are closed weekends and major holidays. You have access to a nurse at all times for urgent questions. Please call the main number to the clinic Dept: [phone] and follow the prompts.   For any non-urgent questions, you may also contact your provider using MyChart. We now offer e-Visits for anyone 72 and older to request care online for non-urgent symptoms. For details visit [email or web address].   Also download the MyChart app! Go to the app store, search "MyChart", open the app, select [facility], and log in with your MyChart username and password.  Due to Covid, a mask is required upon entering the hospital/clinic. If you do not have a mask, one will be given to you upon arrival. For doctor visits, patients may have 1 support person aged 72 or older with them. For treatment visits, patients cannot have anyone with them due to current Covid guidelines and our immunocompromised population.

## 2021-04-15 NOTE — Assessment & Plan Note (Signed)
Palpable left breast mass started February 2021 (patient lives in the [place]), went to [place] and mammogram and ultrasound 01/03/2020: 8 cm distortion and calcifications left breast 2:00 and 3 abnormal axillary lymph nodes. 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  Treatment Plan:: 1. Neoadjuvant chemotherapy with TCH Perjeta 6 cycles followed by HerceptinPerjeta versus Kadcylamaintenance for 1 year 2.6/29/2022lumpectomywith the targeted node dissection: Pathologic complete response 0/3 lymph nodes negative 3. Followed by adjuvant radiation therapycompleted 02/11/2021 4.Followed by antiestrogen therapy  -------------------------------------------------------------------------------------------------------------------------------- Current Treatment:Herceptin Perjeta Maintenance Left Lumpectomy6/29/2022: complete path response' 0/3 LN  Chemotherapy-induced anemia: Hemoglobin has improved to 10.9 Continue with HP maintenance Radiationongoing  She would like to get the port removed because it is irritating her. She will receive the rest of the Herceptin Perjeta treatments through a peripheral IV.  Breast cancer surveillance: 1.  She will get surveillance in [place] closer to her home.  2. mammogram once a year in November.  Return to clinic every 3 weeks for Herceptin Perjeta and every 6 weeks with follow-up with me.

## 2021-04-15 NOTE — Progress Notes (Signed)
Met with patient in treatment area to provide Good Faith Estimate. Explained to interpreter what it was for and provided documentation for her to go over with the patient.  Patient had question regarding grant balance and usage. Provided grant balance and expense sheet and advised what is need for expense she was thinking of submitting. Also advised gift cards are deducted from that grant as well.  She has my card for any additional financial questions or concerns.

## 2021-04-16 ENCOUNTER — Ambulatory Visit: Payer: Self-pay | Admitting: Hematology and Oncology

## 2021-04-16 ENCOUNTER — Ambulatory Visit: Payer: Self-pay

## 2021-04-27 ENCOUNTER — Other Ambulatory Visit: Payer: Self-pay

## 2021-04-29 ENCOUNTER — Encounter: Payer: Self-pay | Admitting: *Deleted

## 2021-05-10 NOTE — Progress Notes (Signed)
..  Patient Assist/Replace for the following has been terminated. Medication: Udenyca ([id number]) Reason for Termination: Treatment completed on 04/15/2021, patient has moved back to [place]. Last DOS: 08/27/2020.  Marland KitchenJuan Quam, CPhT IV Drug Replacement Specialist [place] Phone: [phone]

## 2021-05-10 NOTE — Progress Notes (Signed)
..  Patient Assist/Replace for the following has been terminated. Medication: Herceptin (trastuzumab) Reason for Termination: Treatment completed on 04/15/2021, patient has moved back to [place]. Last DOS: 04/15/2021.  Marland KitchenJuan Quam, CPhT IV Drug Replacement Specialist [place] Phone: [phone]

## 2021-05-10 NOTE — Progress Notes (Signed)
..  Patient Assist/Replace for the following has been terminated. Medication: Perjta (pertuzumab) Reason for Termination: Treatment completed on 04/15/2021, patient has moved back to [place]. Last DOS: 04/15/2021.  Marland KitchenJuan Quam, CPhT IV Drug Replacement Specialist [place] Phone: [phone]

## 2021-09-07 ENCOUNTER — Encounter (HOSPITAL_COMMUNITY): Payer: Self-pay

## 2021-11-18 ENCOUNTER — Other Ambulatory Visit: Payer: Self-pay | Admitting: Nurse Practitioner
